# Patient Record
Sex: Male | Born: 1940
Health system: Southern US, Community
[De-identification: ages and names within clinical notes are randomized; demographics above are authoritative.]

## PROBLEM LIST (undated history)

## (undated) DIAGNOSIS — M1990 Unspecified osteoarthritis, unspecified site: Secondary | ICD-10-CM

## (undated) DIAGNOSIS — D649 Anemia, unspecified: Secondary | ICD-10-CM

## (undated) DIAGNOSIS — H9190 Unspecified hearing loss, unspecified ear: Secondary | ICD-10-CM

## (undated) DIAGNOSIS — I219 Acute myocardial infarction, unspecified: Secondary | ICD-10-CM

## (undated) DIAGNOSIS — H05249 Constant exophthalmos, unspecified eye: Secondary | ICD-10-CM

## (undated) DIAGNOSIS — E079 Disorder of thyroid, unspecified: Secondary | ICD-10-CM

## (undated) DIAGNOSIS — H53121 Transient visual loss, right eye: Secondary | ICD-10-CM

## (undated) DIAGNOSIS — E785 Hyperlipidemia, unspecified: Secondary | ICD-10-CM

## (undated) DIAGNOSIS — H903 Sensorineural hearing loss, bilateral: Secondary | ICD-10-CM

## (undated) DIAGNOSIS — D333 Benign neoplasm of cranial nerves: Secondary | ICD-10-CM

## (undated) DIAGNOSIS — I2510 Atherosclerotic heart disease of native coronary artery without angina pectoris: Secondary | ICD-10-CM

## (undated) HISTORY — DX: Transient visual loss, right eye: H53.121

## (undated) HISTORY — DX: Disorder of thyroid, unspecified: E07.9

## (undated) HISTORY — DX: Acute myocardial infarction, unspecified: I21.9

## (undated) HISTORY — PX: WISDOM TOOTH EXTRACTION: SHX21

## (undated) HISTORY — DX: Unspecified osteoarthritis, unspecified site: M19.90

## (undated) HISTORY — DX: Constant exophthalmos, unspecified eye: H05.249

## (undated) HISTORY — DX: Hyperlipidemia, unspecified: E78.5

## (undated) HISTORY — DX: Benign neoplasm of cranial nerves: D33.3

## (undated) HISTORY — DX: Sensorineural hearing loss, bilateral: H90.3

## (undated) HISTORY — DX: Atherosclerotic heart disease of native coronary artery without angina pectoris: I25.10

## (undated) HISTORY — PX: CARDIAC CATHETERIZATION: SHX172

## (undated) HISTORY — PX: TONSILLECTOMY AND ADENOIDECTOMY: SHX28

---

## 2001-01-06 ENCOUNTER — Ambulatory Visit (HOSPITAL_COMMUNITY): Admission: RE | Admit: 2001-01-06 | Discharge: 2001-01-06 | Payer: Self-pay | Admitting: Cardiology

## 2001-01-06 ENCOUNTER — Encounter: Payer: Self-pay | Admitting: Cardiology

## 2010-07-11 ENCOUNTER — Inpatient Hospital Stay (HOSPITAL_COMMUNITY)
Admission: EM | Admit: 2010-07-11 | Discharge: 2010-07-14 | Disposition: A | Discharge status: Home / Self Care | Payer: Self-pay | Source: Home / Self Care | Attending: Cardiology | Admitting: Cardiology

## 2010-07-11 LAB — COMPREHENSIVE METABOLIC PANEL
ALT: 19 U/L (ref 0–53)
AST: 20 U/L (ref 0–37)
Albumin: 3.6 g/dL (ref 3.5–5.2)
Alkaline Phosphatase: 40 U/L (ref 39–117)
BUN: 16 mg/dL (ref 6–23)
CO2: 23 mEq/L (ref 19–32)
Calcium: 8.5 mg/dL (ref 8.4–10.5)
Chloride: 105 mEq/L (ref 96–112)
Creatinine, Ser: 0.88 mg/dL (ref 0.4–1.5)
GFR calc Af Amer: >60 mL/min (ref >60)
GFR calc non Af Amer: >60 mL/min (ref >60)
Glucose, Bld: 109 mg/dL — ABNORMAL HIGH (ref 70–99)
Potassium: 3.9 mEq/L (ref 3.5–5.1)
Sodium: 136 mEq/L (ref 135–145)
Total Bilirubin: 0.3 mg/dL (ref 0.3–1.2)
Total Protein: 6.2 g/dL (ref 6.0–8.3)

## 2010-07-11 LAB — APTT: aPTT: 27 seconds (ref 24–37)

## 2010-07-11 LAB — POCT CARDIAC MARKERS
CKMB, poc: 1.1 ng/mL (ref 1.0–8.0)
Myoglobin, poc: 62.2 ng/mL (ref 12–200)
Troponin i, poc: <0.05 ng/mL (ref 0.00–0.09)

## 2010-07-11 LAB — CBC
HCT: 40.5 % (ref 39.0–52.0)
Hemoglobin: 13.7 g/dL (ref 13.0–17.0)
MCH: 29.5 pg (ref 26.0–34.0)
MCHC: 33.8 g/dL (ref 30.0–36.0)
MCV: 87.1 fL (ref 78.0–100.0)
Platelets: 257 10*3/uL (ref 150–400)
RBC: 4.65 MIL/uL (ref 4.22–5.81)
RDW: 12.9 % (ref 11.5–15.5)
WBC: 7 10*3/uL (ref 4.0–10.5)

## 2010-07-11 LAB — TROPONIN I: Troponin I: <0.01 ng/mL (ref 0.00–0.06)

## 2010-07-11 LAB — DIFFERENTIAL
Basophils Absolute: 0 10*3/uL (ref 0.0–0.1)
Basophils Relative: 0 % (ref 0–1)
Eosinophils Absolute: 0.3 10*3/uL (ref 0.0–0.7)
Eosinophils Relative: 4 % (ref 0–5)
Lymphocytes Relative: 26 % (ref 12–46)
Lymphs Abs: 1.8 10*3/uL (ref 0.7–4.0)
Monocytes Absolute: 0.6 10*3/uL (ref 0.1–1.0)
Monocytes Relative: 9 % (ref 3–12)
Neutro Abs: 4.3 10*3/uL (ref 1.7–7.7)
Neutrophils Relative %: 61 % (ref 43–77)

## 2010-07-11 LAB — PROTIME-INR: INR: 0.9 (ref 0.00–1.49)

## 2010-07-11 LAB — BRAIN NATRIURETIC PEPTIDE: Pro B Natriuretic peptide (BNP): <30 pg/mL (ref 0.0–100.0)

## 2010-07-12 LAB — CBC
MCV: 87.3 fL (ref 78.0–100.0)
Platelets: 229 10*3/uL (ref 150–400)
RBC: 4.32 MIL/uL (ref 4.22–5.81)
WBC: 6.2 10*3/uL (ref 4.0–10.5)

## 2010-07-12 LAB — LIPID PANEL
Cholesterol: 189 mg/dL (ref 0–200)
HDL: 27 mg/dL — ABNORMAL LOW (ref >39)

## 2010-07-12 LAB — CARDIAC PANEL(CRET KIN+CKTOT+MB+TROPI)
CK, MB: 2.4 ng/mL (ref 0.3–4.0)
Relative Index: INVALID (ref 0.0–2.5)
Total CK: 82 U/L (ref 7–232)
Troponin I: <0.01 ng/mL (ref 0.00–0.06)

## 2010-07-12 LAB — TSH: TSH: 2.514 u[IU]/mL (ref 0.350–4.500)

## 2010-07-12 LAB — HEPARIN LEVEL (UNFRACTIONATED): Heparin Unfractionated: 0.41 IU/mL (ref 0.30–0.70)

## 2010-07-12 LAB — HEMOGLOBIN A1C
Hgb A1c MFr Bld: 5.9 % — ABNORMAL HIGH (ref <5.7)
Mean Plasma Glucose: 123 mg/dL — ABNORMAL HIGH (ref <117)

## 2010-07-12 LAB — SURGICAL PCR SCREEN: MRSA, PCR: NEGATIVE

## 2010-07-13 ENCOUNTER — Encounter: Payer: Self-pay | Admitting: Cardiovascular Disease

## 2010-07-13 LAB — BASIC METABOLIC PANEL
CO2: 24 mEq/L (ref 19–32)
Chloride: 106 mEq/L (ref 96–112)
GFR calc Af Amer: >60 mL/min (ref >60)
Sodium: 138 mEq/L (ref 135–145)

## 2010-07-13 LAB — HEPARIN LEVEL (UNFRACTIONATED)
Heparin Unfractionated: 0.44 IU/mL (ref 0.30–0.70)
Heparin Unfractionated: 0.3 IU/mL (ref 0.30–0.70)

## 2010-07-13 LAB — CBC
HCT: 39.9 % (ref 39.0–52.0)
Hemoglobin: 13.5 g/dL (ref 13.0–17.0)
RBC: 4.51 MIL/uL (ref 4.22–5.81)
WBC: 5.9 10*3/uL (ref 4.0–10.5)

## 2010-07-14 LAB — BASIC METABOLIC PANEL
BUN: 21 mg/dL (ref 6–23)
CO2: 25 mEq/L (ref 19–32)
Chloride: 104 mEq/L (ref 96–112)
Creatinine, Ser: 1.05 mg/dL (ref 0.4–1.5)
GFR calc Af Amer: >60 mL/min (ref >60)
Potassium: 4.3 mEq/L (ref 3.5–5.1)

## 2010-07-14 LAB — CBC
Hemoglobin: 12.7 g/dL — ABNORMAL LOW (ref 13.0–17.0)
MCH: 28.7 pg (ref 26.0–34.0)
MCV: 89.4 fL (ref 78.0–100.0)
Platelets: 236 10*3/uL (ref 150–400)
RBC: 4.43 MIL/uL (ref 4.22–5.81)
WBC: 9 10*3/uL (ref 4.0–10.5)

## 2010-07-14 LAB — POCT ACTIVATED CLOTTING TIME: Activated Clotting Time: 499 seconds

## 2010-07-14 NOTE — Procedures (Signed)
NAME:  Jeffery Turner, Jeffery Turner                  ACCOUNT NO.:  [id number]  MEDICAL RECORD NO.:  [id number]          PATIENT TYPE:  INP  LOCATION:  2807                         FACILITY:  [facility]  PHYSICIAN:  Veverly Fells. Excell Seltzer, MD  DATE OF BIRTH:  1940-09-26  DATE OF PROCEDURE:  07/13/2010 DATE OF DISCHARGE:                           CARDIAC CATHETERIZATION   PROCEDURES: 1. PTCA and stenting of the right coronary artery. 2. PTCA and stenting of the mid-LAD.  PROCEDURAL INDICATIONS:  Jeffery Turner is a 70 year old gentleman with known CAD.  He has undergone previous stenting of the left circumflex.  He presented with unstable angina and underwent diagnostic catheterization by Dr. Eden Emms this morning.  This demonstrated severe two-vessel coronary disease involving the right coronary artery and LAD.  We elected to proceed with PCI.  PROCEDURE IN DETAIL:  There was an indwelling 5-French sheath.  The sheath was upsized to a 6-French.  Attention was first turned to the right coronary artery which appeared to be the patient's culprit lesion. There was a critical, long-segment 95% mid stenosis.  The patient has a previous intolerance to PLAVIX and TICLID.  He was thus treated with Brilinta.  He was loaded with 180 mg on the table.  Angiomax was used for anticoagulation.  A Cougar guidewire was advanced into the distal right coronary artery once a therapeutic ACT was achieved.  The lesion was predilated with a 2.5 x 20 mm track balloon which was taken to 10 atmospheres on a single inflation.  The vessel was then stented with a 3.5 x 28-mm Promus drug-eluting stent which was carefully positioned and deployed at 12 atmospheres.  The stent appeared well expanded.  The stent was then postdilated with a 3.75 x 20-mm [place] track balloon which was dilated twice, up to 16 atmospheres, so that the entire stented segment was covered.  There was an excellent angiographic result with 0% residual stenosis.  There was  diffuse proximal stenosis, but it appeared nonobstructive and that area will be treated medically.  Attention was then turned to the LAD.  There was a focal 80% stenosis involving the origin of the first diagonal branch.  The LAD was predilated with a 2.5 x 12 mm track balloon, which was taken to 10 atmospheres and the balloon appeared well expanded.  It was then stented with a 2.75 x 16 mm Promus drug-eluting stent.  The stent was deployed at 12 atmospheres and appeared well expanded.  The stented segment was then postdilated with a 3.0 x 15-mm [place] track balloon, which was taken to 14 atmospheres on a single inflation.  There was an excellent angiographic result with 0% residual stenosis.  The patient tolerated the procedure well.  There was some segmental disease near the apical region of the LAD, and I thought this would be best left for medical therapy based on the distal location of the disease.  The guidewire and guide catheter were removed.  A TR band was used for radial hemostasis.  The patient will be transferred to the recovery area. FINAL ASSESSMENT:  Successful two-vessel PCI using a drug-eluting stent in the mid  right coronary artery and a drug-eluting stent in the mid LAD.  RECOMMENDATIONS:  The patient should continue on aspirin and Brilinta for 12 months minimum.  The Brilinta dose will be 90 mg twice daily.     Veverly Fells. Excell Seltzer, MD     MDC/MEDQ  D:  07/13/2010  T:  07/14/2010  Job:  [id number]  cc:   Rollene Rotunda, MD, [facility]  Electronically Signed by Tonny Bollman MD on 07/14/2010 05:02:30 AM

## 2010-07-15 NOTE — Discharge Summary (Addendum)
NAME:  Jeffery Turner, Jeffery Turner                  ACCOUNT NO.:  [id number]  MEDICAL RECORD NO.:  [id number]          PATIENT TYPE:  INP  LOCATION:  6531                         FACILITY:  [facility]  PHYSICIAN:  Veverly Fells. Excell Seltzer, MD  DATE OF BIRTH:  12/05/1940  DATE OF ADMISSION:  07/11/2010 DATE OF DISCHARGE:  07/14/2010                              DISCHARGE SUMMARY   DISCHARGE DIAGNOSES: 1. Unstable angina with severe two-vessel coronary artery disease     within the right coronary artery and mid left anterior descending,     status post successful drug-eluting stent placement to both the mid     right coronary artery and mid left anterior descending.     a.     The patient has history of easy bleeding and therefore will      be placed on Brilinta and 81 mg of aspirin daily.     b.     History of myocardial infarction in 1997 with stent to the      obtuse marginal-3.     c.     Ejection fraction of 60% by cath, July 13, 2010. 2. Hyperlipidemia.  Total cholesterol 189, triglycerides 244, HDL 27,     and LDL 113 with history of statin intolerance.  Low dose Crestor     at 5 mg daily initiated this admission, will need LFTs drawn in     approximately 4 weeks. 3. Impaired glucose tolerance with hemoglobin A1c of 5.9, instructed     to follow up with primary care doctor for surveillance for diabetes     risk. 4. Remote history of tobacco abuse. 5. Osteoarthritis.  HOSPITAL COURSE:  Mr. Jeffery Turner is a 70 year old gentleman with a prior history of coronary artery disease with an MI in 1997 with stent placement to OM-3 at that time.  He also has history of hyperlipidemia and presented to [facility] with complaints of exertional chest pain as well as shortness of breath and nausea.  His symptoms progressed and at first when we watched him on treadmill with exercise over the last week prior to admission, he got symptoms with walking less than 100 feet on flat ground.  On admission to the  hospital, cardiac enzymes were cycled which were negative x4.  The patient's symptoms were concerning for unstable angina and therefore, he was placed on IV heparin, aspirin, and beta-blockade.  Lipid panel was checked which showed the above results and Crestor 5 mg at a low dose was initiated given his history of LFT elevation with statin.  LFTs were normal on July 11, 2010. With careful attention that these should be checked as an outpatient as well in several weeks.  The patient underwent cardiac catheterization on July 13, 2010, after being stable over the weekend.  This showed two- vessel disease with patent stents in the circumflex.  He had flow- limiting stenosis in both the LAD and RCA.  This was subsequently stented by Dr. Excell Seltzer with Promus drug-eluting stents placed both in mid RCA and mid LAD.  EF was normal during those cath.  The patient did well post  procedurally.  Labs remained stable.  He did have some nausea this morning which was initially attributed to Brilinta and later on, it was discovered that it was possibly related to his pain medicine NSAIDs. The patient was seen and examined by Dr. Excell Seltzer and will be discharged in stable condition to home per his recommendation.  DISCHARGE LABORATORY DATA:  WBC 5.9, hemoglobin 13.5, hematocrit 39.9, and platelet count 244.  Sodium 138, potassium 4.6, chloride 106, CO2 of 24, glucose 113, BUN 14, and creatinine 0.89.  LFTs were normal on July 11, 2010.  A1c is 5.9.  Cardiac enzymes negative x3. Cholesterol panel as above.  STUDIES: 1. Chest x-ray, July 11, 2010, showed normal heart size and clear     lungs. 2. Cardiac catheterization, July 13, 2010, please see full report     for details.  DISCHARGE MEDICATIONS: 1. Carvedilol 3.125 mg b.i.d. 2. Nitroglycerin sublingual 0.4 mg every 5 minutes as needed up to 3     doses. 3. Crestor 5 mg at bedtime. 4. Ticagrelor 90 mg b.i.d. 5. Aspirin 81 mg daily. 6.  Echinacea 3 capsules t.i.d. p.r.n. 7. Glucosamine 1-2 tablets daily. 8. Multivitamin 1 tablet daily. 9. Vitamin C 3 tablets t.i.d. p.r.n.  DISPOSITION:  [name] will be discharged in stable condition to home. He is not to lift anything or participate in sexual activity for 1 week or drive for 2 days.  He is to follow a low-sodium, heart-healthy diet and if he notices any pain, swelling, bleeding, or pus at his cath site, he is to call or return.  He will follow up with Tereso Newcomer, PA-C, for his initial post hospital visit on July 27, 2010, at 10 a.m.  He is also instructed to follow up with his primary care doctor for surveillance of his blood sugars as it was mildly elevated with a hemoglobin A1c of 5.9 putting him at risk for diabetes.  He should have LFTs drawn in approximately 4-6 weeks to monitor stability of his LFTs given history of statin intolerance.  The patient was given a Brilinta 30-day assistance card.  DURATION OF DISCHARGE ENCOUNTER:  Greater than 30 minutes including physician and PA time.     Ronie Spies, P.A.C.   ______________________________ Veverly Fells. Excell Seltzer, MD    DD/MEDQ  D:  07/14/2010  T:  07/15/2010  Job:  [id number]  cc:   [facility]  Electronically Signed by Ronie Spies  on 07/15/2010 09:31:48 PM Electronically Signed by Tonny Bollman MD on 07/17/2010 02:03:56 PM

## 2010-07-16 NOTE — Procedures (Signed)
  NAME:  Jeffery Turner, Jeffery Turner                  ACCOUNT NO.:  [id number]  MEDICAL RECORD NO.:  [id number]           PATIENT TYPE:  LOCATION:                                 FACILITY:  PHYSICIAN:  Sargent Mankey C. Eden Emms, MD, FACCDATE OF BIRTH:  30-Jan-1941  DATE OF PROCEDURE: DATE OF DISCHARGE:                           CARDIAC CATHETERIZATION   A 70 year old patient with history of stent to the circumflex coronary artery, admitted with unstable angina.  Cine catheterization was done with 5-French catheter from the right radial artery.  Standard JL-3.5 and JR-4 catheters were used to engage the coronaries.  The patient received 6000 units of heparin, 3 mg of verapamil.  He was sedated with 3 mg of Versed and 25 mcg of fentanyl.  Left main coronary was normal.  Left anterior descending artery had a 70% lesion in the midvessel, distal vessel had 60% tubular disease.  First and second diagonal branch were normal.  Circumflex coronary artery was nondominant.  The stent in the large mid part of the vessel was widely patent.  First obtuse marginal branch had 30% to 40% multiple lesions.  Right coronary artery was dominant.  There was somewhat aneurysmal 30% disease proximally.  The distal vessel had a 90% stenosis.  PDA had 40% tubular disease.  RAO ventriculography:  RAO ventriculography was normal.  EF was 60%. There was no regional wall motion abnormalities.  The aortic pressure was 112/62, LV pressure was 110/4.  IMPRESSION:  The patient has severe two-vessel disease with a patent stent in his circumflex.  Films were reviewed with Dr. Excell Seltzer.  He will proceed with angioplasty and stenting of both the right coronary artery and left anterior descending.  He has had previous issues with bleeding and bruising on Plavix and Ticlid.  -__________ will be used as an antiplatelet agent due to shorter half-life  The patient tolerated his diagnostic catheterization well.     Noralyn Pick. Eden Emms, MD,  [facility]     PCN/MEDQ  D:  07/13/2010  T:  07/14/2010  Job:  [id number]  Electronically Signed by Charlton Haws MD [facility] on 07/16/2010 10:38:31 PM

## 2010-07-21 ENCOUNTER — Encounter: Payer: Self-pay | Admitting: Physician Assistant

## 2010-07-21 ENCOUNTER — Ambulatory Visit (INDEPENDENT_AMBULATORY_CARE_PROVIDER_SITE_OTHER): Payer: Medicare Other | Admitting: Physician Assistant

## 2010-07-21 ENCOUNTER — Ambulatory Visit: Payer: Self-pay | Admitting: Physician Assistant

## 2010-07-21 DIAGNOSIS — I2510 Atherosclerotic heart disease of native coronary artery without angina pectoris: Secondary | ICD-10-CM

## 2010-07-21 DIAGNOSIS — I25118 Atherosclerotic heart disease of native coronary artery with other forms of angina pectoris: Secondary | ICD-10-CM | POA: Insufficient documentation

## 2010-07-21 DIAGNOSIS — Z87891 Personal history of nicotine dependence: Secondary | ICD-10-CM

## 2010-07-21 DIAGNOSIS — R040 Epistaxis: Secondary | ICD-10-CM

## 2010-07-21 DIAGNOSIS — M1990 Unspecified osteoarthritis, unspecified site: Secondary | ICD-10-CM | POA: Insufficient documentation

## 2010-07-21 DIAGNOSIS — E782 Mixed hyperlipidemia: Secondary | ICD-10-CM

## 2010-07-21 DIAGNOSIS — E785 Hyperlipidemia, unspecified: Secondary | ICD-10-CM | POA: Insufficient documentation

## 2010-07-21 DIAGNOSIS — R0602 Shortness of breath: Secondary | ICD-10-CM

## 2010-07-21 DIAGNOSIS — I200 Unstable angina: Secondary | ICD-10-CM

## 2010-07-21 DIAGNOSIS — I252 Old myocardial infarction: Secondary | ICD-10-CM

## 2010-07-21 HISTORY — DX: Old myocardial infarction: I25.2

## 2010-07-21 HISTORY — DX: Shortness of breath: R06.02

## 2010-07-21 HISTORY — DX: Unspecified osteoarthritis, unspecified site: M19.90

## 2010-07-21 HISTORY — DX: Epistaxis: R04.0

## 2010-07-21 HISTORY — DX: Personal history of nicotine dependence: Z87.891

## 2010-07-21 HISTORY — DX: Mixed hyperlipidemia: E78.2

## 2010-07-21 HISTORY — DX: Atherosclerotic heart disease of native coronary artery with other forms of angina pectoris: I25.118

## 2010-07-21 HISTORY — DX: Unstable angina: I20.0

## 2010-07-21 NOTE — H&P (Signed)
NAME:  Jeffery Turner, Jeffery Turner NO.:  [id number]  MEDICAL RECORD NO.:  [id number]          PATIENT TYPE:  INP  LOCATION:  2012                         FACILITY:  [facility]  PHYSICIAN:  Cassell Clement, M.D. DATE OF BIRTH:  23-Oct-1940  DATE OF ADMISSION:  07/11/2010 DATE OF DISCHARGE:                             HISTORY & PHYSICAL   PRIMARY CARE PHYSICIAN:  At [facility].  PRIMARY CARDIOLOGIST:  Gene Serpe, PA-C, original cardiologist unclear.  CHIEF COMPLAINT:  Chest pain.  HISTORY OF PRESENT ILLNESS:  Jeffery Turner is a 70 year old male with a history of coronary artery disease.  He had onset of episodic substernal chest pain that reached to 3/10.  It was associated with exertion.  He had multiple episodes that were relieved by rest in about 15 minutes. It radiated up into his jaw and into his teeth.  He had slight nausea and minimal shortness of breath.  He noticed a slight dyspnea on exertion.  His symptoms progressed and at first were only when walking on the treadmill during exercise.  Over the last week, they have progressed and now he gets symptoms walking less than 100 feet on flat ground.  The progression in his symptoms made him decide to come to the hospital.  He has not had any resting chest pain and is currently pain free.  PAST MEDICAL HISTORY: 1. History of MI in 1997 with a stent to the OM-3. 2. Status post cardiac catheterization last in 2002 showing an EF of     57%, LAD 30 and 40%, circumflex 40%, 30% in-stent restenosis in the     circumflex, RCA 30%. 3. Stress Cardiolite in August 2002 showing an inferior scar, but no     ischemia, EF 55%. 4. Hyperlipidemia. 5. Remote history of tobacco use. 6. Family history of coronary artery disease. 7. Osteoarthritis.  SURGICAL HISTORY:  He is status post cardiac catheterizations as well as throat surgery.  ALLERGIES:  He is allergic or intolerant to CODEINE and TICLID and had side effects  with PLAVIX.  CURRENT MEDICATIONS: 1. Glucosamine 2 tablets daily. 2. Multivitamin daily. 3. Echinacea 3 capsules 3 times a day p.r.n. 4. Vitamin C OTC 3 tablets 3 times a day p.r.n. 5. Aspirin 325 mg 1 tablet daily. 6. Serrapeptase daily.  SOCIAL HISTORY:  He lives in [place] with family.  He is a retired Museum/gallery exhibitions officer.  He quit tobacco in 1997 with approximately 30-pack- year history and denies alcohol or drug abuse.  FAMILY HISTORY:  His mother died at age 70 and his father died at 74, neither one with coronary artery disease.  His father died from a reaction to sulfa.  He has a brother who had coronary artery disease starting in his 39s.  REVIEW OF SYSTEMS:  He has chronic arthralgias.  He has not had fevers, chills, or cough or cold symptoms.  Full 14-point review of systems is essentially negative except as stated in the HPI.  PHYSICAL EXAMINATION:  VITAL SIGNS:  He is afebrile.  Blood pressure 131/77, heart rate 75, respiratory rate 15, and O2 saturation 98% on  2 liters. GENERAL:  He is a well-developed, elderly white male in no acute distress. HEENT:  Normal. NECK:  There is no lymphadenopathy, thyromegaly, bruit, or JVD noted. CARDIOVASCULAR:  His heart is regular in rate and rhythm with an S1 and S2 and no significant murmur, rub, or gallop is noted.  Distal pulses are intact in all 4 extremities. LUNGS:  Clear to auscultation bilaterally. SKIN:  No rashes or lesions are noted. ABDOMEN:  Soft and nontender with active bowel sounds. EXTREMITIES:  There is no cyanosis, clubbing, or edema noted. MUSCULOSKELETAL:  There is no joint deformity or effusions and no spine or CVA tenderness. NEURO:  He is alert and oriented with cranial nerves II-XII grossly intact.  Chest x-ray:  No acute disease.  EKG is sinus rhythm, rate 77, with nonspecific interventricular conduction delay and QRS duration of 116 milliseconds.  No old tracing is available for comparison and  there is no ST elevation noted.  LABORATORY VALUES:  Hemoglobin 13.7, hematocrit 40.5, WBC 7.0, and platelets 257.  Sodium 136, potassium 3.9, chloride 105, CO2 of 23, BUN 16, creatinine 0.88, and glucose 109.  CK-MB 97/2.9 with a troponin of 0.01 and point-of-care markers negative x1, BNP less than 30.  IMPRESSION:  Jeffery Turner was seen today by Dr. Patty Sermons, the patient evaluated and the data reviewed.  Jeffery Turner is a 70 year old male who presents with a history of typical exertional angina with radiation to his jaw, relieved by rest.  He has had no rest angina.  His exam is stable and his EKG shows no acute changes.  IMPRESSION: 1. Acute coronary syndrome with recent onset, progressive exertional     angina. 2. Status post remote total occlusion of circumflex, treated with     successful primary stenting by Dr. Riley Kill on March 02, 1996.     Last cath was in 2002 showing no critical disease. 3. Intolerant to STATINS, with elevated liver function tests.  PLAN: 1. Admit to telemetry. 2. IV heparin, aspirin, and low-dose beta-blocker. 3. Cardiac catheterization on Monday or sooner on a p.r.n. basis.     Jeffery Demark, PA-C   ______________________________ Cassell Clement, M.D.    RB/MEDQ  D:  07/11/2010  T:  07/12/2010  Job:  [id number]  Electronically Signed by Jeffery Demark PA-C on 07/20/2010 12:52:09 PM Electronically Signed by Cassell Clement M.D. on 07/21/2010 09:02:58 AM

## 2010-07-27 ENCOUNTER — Ambulatory Visit: Payer: Self-pay | Admitting: Physician Assistant

## 2010-07-30 NOTE — Cardiovascular Report (Signed)
Summary: [facility] Cardiac Cath  [facility] Cardiac Cath   Imported By: Earl Many 07/22/2010 16:23:30  _____________________________________________________________________  External Attachment:    Type:   Image     Comment:   External Document

## 2010-07-30 NOTE — Assessment & Plan Note (Signed)
Summary: eph.per dana pa.gd--please schedule follow-up liver panel per...   Visit Type:  Follow-up  CC:  sob and slight dizziness.  History of Present Illness: Primary Cardiologist:  Dr. Tonny Bollman  Nihar Klus is a 70 yo male with a history of myocardial infarction in 1997 treated with stenting to the circumflex.  He presented to [facility] on 1/28 with symptoms of unstable angina.  He ruled out for myocardial infarction.  Cardiac catheterization demonstrated significant disease in the LAD and RCA and he underwent drug-eluting stent placement to the mid RCA and mid LAD.  His ejection fraction was 60%.  He has a history of elevated LFTs with statins.  It was felt that he should try low-dose Crestor and have close followup of his LFTs.  He was placed on Brinlinta due to concerns over bleeding.  This was chosen due to its short half-life.  He returns for followup today.  He stopped taking his Brilinta about 2 days after getting home.  He complains of epistaxis.  He notes that he would have significant amounts of blood running down his throat especially at night.  This completely stopped when he stopped taking the Brilinta.  He denies chest pain.  He denies syncope.  He denies orthopnea or PND.  He continues to have significant dyspnea with exertion.  This symptom predated his unstable angina.  It is unchanged.  Current Medications (verified): 1)  Carvedilol 3.125 Mg Tabs (Carvedilol) .... Take One Two Times A Day 2)  Crestor 5 Mg Tabs (Rosuvastatin Calcium) .... Take One Daily 3)  Aspirin 81 Mg Tbec (Aspirin) .... Take One Daily 4)  Nitrostat 0.4 Mg Subl (Nitroglycerin) .... Take As Needed 5)  Multivitamins  Caps (Multiple Vitamin) .... Take One Daily 6)  Glucosamine 500 Mg Caps (Glucosamine Sulfate) .... Take One To Two Daily  Allergies (verified): 1)  ! Codeine  Past History:  Past Medical History: CAD   a. s/p stent to CFX 1997 2/2 MI   b. s/p DES to [facility] and DES mLAD 07/14/10 2/2 [place]   c.  cath 07/14/10: dLAD 60%; CFX stent ok; OM1 30-40%; pRCA 30%; PDA 40%; EF 60% OSTEOARTHRITIS (ICD-715.90) HYPERLIPIDEMIA (ICD-272.4) Glucose Intolerance     Social History: Reviewed history from 07/20/2010 and no changes required. He lives in [place] with family.  He is a retired   Museum/gallery exhibitions officer.  He quit tobacco in 1997 with approximately 30-pack-   year history and denies alcohol or drug abuse.      Review of Systems       As per  the HPI.  All other systems reviewed and negative.   Vital Signs:  Patient profile:   70 year old male Height:      68 inches Weight:      206 pounds BMI:     31.44 Pulse rate:   80 / minute BP sitting:   108 / 68  (right arm)  Vitals Entered By: Jacquelin Hawking, CMA (July 21, 2010 1:28 PM)  Physical Exam  General:  Well nourished, well developed, in no acute distress HEENT: normal Neck: no JVD Cardiac:  normal S1, S2; RRR; no murmur Lungs:  clear to auscultation bilaterally, no wheezing, rhonchi or rales Abd: soft, nontender, no hepatomegaly Ext: no edema; right radial site without hematoma or bruit Skin: warm and dry Neuro:  CNs 2-12 intact, no focal abnormalities noted    EKG  Procedure date:  07/21/2010  Findings:      normal sinus  rhythm Heart rate 83 Normal axis Interventricular conduction delay Nonspecific ST-T wave changes No significant change since prior tracing.  Impression & Recommendations:  Problem # 1:  CAD (ICD-414.00) He is stable since he underwent stenting of his RCA and LAD.  He continues on aspirin.  Unfortunately, he stopped taking his Brilinta.  I explained to him today the importance of taking this medication along with aspirin.  He understands this.  I have placed him on Effient 10 mg a day.  I have given him samples.  If he has recurrent epistaxis, he should call us.  At that time, I think we should consider sending him to ENT.  He knows that he should not stop his dual antiplatelet therapy.  Follow  up with Dr. Excell Seltzer in 2 weeks.  Orders: EKG w/ Interpretation ([id number])  Problem # 2:  HYPERLIPIDEMIA (ICD-272.4) He has a history of elevated LFTs.  He will have followup LFTs when he returns in 2 weeks to see Dr. Excell Seltzer.  We will also arrange followup lipids and LFTs in about 8 weeks. His updated medication list for this problem includes:    Crestor 5 Mg Tabs (Rosuvastatin calcium) .Marland Kitchen... Take one daily  Problem # 3:  SHORTNESS OF BREATH (ICD-786.05) I suspect he probably has COPD.  I have asked him to follow up with his primary care provider for further management.  Problem # 4:  EPISTAXIS (ICD-784.7) As noted, I would suggest that we send him to ENT should he have recurrent epistaxis on Effient and aspirin.  Patient Instructions: 1)  Your physician recommends that you schedule a follow-up appointment in: 08/13/10 @ 9am to see Dr. Excell Seltzer. 2)  Your physician recommends that you return for lab work in: 08/13/10 before your appt with Dr. Excell Seltzer for  LFT for hyperlipidemia.Marland KitchenMarland KitchenMarland KitchenYou are also to come back on 10/05/10 to have Liver and lipid function test repeated.  3)  Your physician has recommended you make the following change in your medication: Start Effient 10 mg 1 tablet every day.  A new prescription has been given to you today. CALL us IF YOU HAVE ANY NOSE BLEEDS ( DO NOT STOP MEDICATION WITH OUT TALKING TO CARDIOLOGIST FIRST, THIS IS VERY IMPROTANT). Prescriptions: EFFIENT 10 MG TABS (PRASUGREL HCL) Take 1 tablet by mouth once a day  #30 x 11   Entered and Authorized by:   Tereso Newcomer PA-C   Signed by:   Danielle Rankin, CMA on 07/21/2010   Method used:   Print then Give to Patient   RxID:   [id number]

## 2010-08-12 ENCOUNTER — Other Ambulatory Visit: Payer: Self-pay | Admitting: Cardiovascular Disease

## 2010-08-12 ENCOUNTER — Encounter: Payer: Self-pay | Admitting: Cardiovascular Disease

## 2010-08-12 ENCOUNTER — Ambulatory Visit (INDEPENDENT_AMBULATORY_CARE_PROVIDER_SITE_OTHER): Payer: Medicare Other | Admitting: Cardiovascular Disease

## 2010-08-12 ENCOUNTER — Other Ambulatory Visit (INDEPENDENT_AMBULATORY_CARE_PROVIDER_SITE_OTHER): Payer: Medicare Other

## 2010-08-12 DIAGNOSIS — E785 Hyperlipidemia, unspecified: Secondary | ICD-10-CM

## 2010-08-12 DIAGNOSIS — I2510 Atherosclerotic heart disease of native coronary artery without angina pectoris: Secondary | ICD-10-CM

## 2010-08-12 DIAGNOSIS — R040 Epistaxis: Secondary | ICD-10-CM

## 2010-08-12 LAB — CBC WITH DIFFERENTIAL/PLATELET
Eosinophils Relative: 3.9 % (ref 0.0–5.0)
HCT: 40 % (ref 39.0–52.0)
Hemoglobin: 13.9 g/dL (ref 13.0–17.0)
Lymphs Abs: 1.7 10*3/uL (ref 0.7–4.0)
MCV: 91.9 fl (ref 78.0–100.0)
Monocytes Absolute: 0.7 10*3/uL (ref 0.1–1.0)
Monocytes Relative: 8.7 % (ref 3.0–12.0)
Neutro Abs: 5.6 10*3/uL (ref 1.4–7.7)
Platelets: 262 10*3/uL (ref 150.0–400.0)
WBC: 8.4 10*3/uL (ref 4.5–10.5)

## 2010-08-12 LAB — HEPATIC FUNCTION PANEL
ALT: 18 U/L (ref 0–53)
AST: 17 U/L (ref 0–37)
Albumin: 4.2 g/dL (ref 3.5–5.2)
Total Protein: 6.9 g/dL (ref 6.0–8.3)

## 2010-08-13 ENCOUNTER — Other Ambulatory Visit: Payer: Medicare Other

## 2010-08-13 ENCOUNTER — Ambulatory Visit: Payer: Medicare Other | Admitting: Cardiovascular Disease

## 2010-08-14 ENCOUNTER — Telehealth: Payer: Self-pay | Admitting: Cardiovascular Disease

## 2010-08-17 ENCOUNTER — Telehealth (INDEPENDENT_AMBULATORY_CARE_PROVIDER_SITE_OTHER): Payer: Self-pay | Admitting: *Deleted

## 2010-08-20 NOTE — Progress Notes (Signed)
Summary: pt need referral to ent  Phone Note Call from Patient   Caller: Patient [phone] ok to leave message Reason for Call: Talk to Nurse Summary of Call: dr Vallerie Hentz suggested pt see ent pt called and they need a refferal call or fax from [place], [facility] in [place] [phone] fax [phone] pt prefers am any day Initial call taken by: Glynda Jaeger,  August 14, 2010 8:55 AM  Follow-up for Phone Call        [facility] currently closed for lunch. Julieta Gutting, RN, BSN  August 14, 2010 12:03 PM  I spoke with Noreene Larsson at [facility] and they just started a new computer system.  They would like the pt's demographics, insurance, OV note and reason for referral faxed to them. They cannot look at the pt's paperwork until next week.  They will make our office aware of pt's appt next week. I made the pt's wife aware and she would like the pt referred to another ENT in [place] because of the delay in [place].  Julieta Gutting, RN, BSN  August 14, 2010 2:01 PM  Additional Follow-up for Phone Call Additional follow up Details #1::        I scheduled the pt to see Dr Narda Bonds on Monday at 2:15.  ( fax [phone])  Pt's wife aware and said she would probably call the office to reschedule appt. Julieta Gutting, RN, BSN  August 14, 2010 2:14 PM

## 2010-08-20 NOTE — Assessment & Plan Note (Signed)
Summary: 3 wk f/u    Visit Type:  Follow-up Primary Provider:  Dr Ardyth Gal - [facility]  CC:  No complaints.  History of Present Illness: Jeffery Turner is a 70 yo male with a history of myocardial infarction in 1997 treated with stenting to the circumflex.  He presented to [facility] on 1/28 with symptoms of unstable angina.  He ruled out for myocardial infarction.  Cardiac catheterization demonstrated significant disease in the LAD and RCA and he underwent drug-eluting stent placement to the mid RCA and mid LAD.  His ejection fraction was 60%.    His main problem since PCI is epistaxis. He is having significant posterior bleeding and subsequent hemoptysis. He has a history of this in the past but it has been much worse since he has started dual antiplatelet Rx. He was initially discharged from the hospital on Brilinta and then switched to Effient.   He denies chest pain, dyspnea, or edema. No other complaints.  Current Medications (verified): 1)  Carvedilol 3.125 Mg Tabs (Carvedilol) .... Take One Two Times A Day 2)  Crestor 5 Mg Tabs (Rosuvastatin Calcium) .... Take One Daily 3)  Aspirin 81 Mg Tbec (Aspirin) .... Take One Daily 4)  Nitrostat 0.4 Mg Subl (Nitroglycerin) .... Take As Needed 5)  Multivitamins  Caps (Multiple Vitamin) .... Take One Daily 6)  Glucosamine 500 Mg Caps (Glucosamine Sulfate) .... Take One To Two Daily 7)  Effient 10 Mg Tabs (Prasugrel Hcl) .... Take 1 Tablet By Mouth Once A Day  Allergies: 1)  ! Codeine  Past History:  Past medical history reviewed for relevance to current acute and chronic problems.  Past Medical History: Reviewed history from 07/21/2010 and no changes required. CAD   a. s/p stent to CFX 1997 2/2 MI   b. s/p DES to [facility] and DES mLAD 07/14/10 2/2 [place]   c. cath 07/14/10: dLAD 60%; CFX stent ok; OM1 30-40%; pRCA 30%; PDA 40%; EF 60% OSTEOARTHRITIS (ICD-715.90) HYPERLIPIDEMIA (ICD-272.4) Glucose Intolerance     Review of  Systems       Negative except as per HPI   Vital Signs:  Patient profile:   70 year old male Height:      68 inches Weight:      200.75 pounds BMI:     30.63 Pulse rate:   80 / minute Pulse rhythm:   regular Resp:     18 per minute BP sitting:   98 / 62  (left arm) Cuff size:   large  Vitals Entered By: Vikki Ports (August 12, 2010 10:27 AM)  Physical Exam  General:  Pt is alert and oriented, in no acute distress, voice hoarseness noted. HEENT: normal Neck: normal carotid upstrokes without bruits, JVP normal Lungs: CTA CV: RRR without murmur or gallop Abd: soft, NT, positive BS, no bruit, no organomegaly Ext: no clubbing, cyanosis, or edema. peripheral pulses 2+ and equal Skin: warm and dry without rash    EKG  Procedure date:  07/21/2010  Findings:      NSR with nonspecific ST-T abnormality, HR 83 bpm.  Impression & Recommendations:  Problem # 1:  CAD (ICD-414.00) Pt stable without angina, but I'm concerned about the degreee of epistaxis he is experiencing. Will check a Hgb and Hct to make sure that he is not anemic. I think we'll be better off with a less potent antiplatelet agent and I've advised him to start plavix 75 mg daily. He will discontinue effient. We discussed his need  for dual antiplatelet Rx following PCI with drug-eluting stents. The guideline recommendation is that he takes both ASA and plavix for 12 months, but we may have to discontinue plavix sooner if his epistaxis cannot be controlled. It would be ideal to get him out to at least 6 months if possible. I'd like to see him back in 2 months for followup to see how he's doing. Will refer him for an ENT evaluation as well.  His updated medication list for this problem includes:    Carvedilol 3.125 Mg Tabs (Carvedilol) .Marland Kitchen... Take one two times a day    Aspirin 81 Mg Tbec (Aspirin) .Marland Kitchen... Take one daily    Nitrostat 0.4 Mg Subl (Nitroglycerin) .Marland Kitchen... Take as needed    Plavix 75 Mg Tabs (Clopidogrel  bisulfate) .Marland Kitchen... Take one tablet by mouth daily  Orders: TLB-CBC Platelet - w/Differential (85025-CBCD) TLB-Hepatic/Liver Function Pnl (80076-HEPATIC)  Problem # 2:  HYPERLIPIDEMIA (ICD-272.4) Goal LDL 70 mg/dL. Will need to followup lipids at the time of his return visit.  His updated medication list for this problem includes:    Crestor 5 Mg Tabs (Rosuvastatin calcium) .Marland Kitchen... Take one daily  Orders: TLB-CBC Platelet - w/Differential (85025-CBCD) TLB-Hepatic/Liver Function Pnl (80076-HEPATIC)  Patient Instructions: 1)  Your physician recommends that you schedule a follow-up appointment in: 2 MONTHS 2)  Your physician recommends that you have lab work today: LIVER, CBC 3)  Your physician has recommended you make the following change in your medication: STOP Effient, START Plavix 75mg  take one by mouth daily 4)  You have been referred to [facility] and Throat for nosebleeds ([phone])--the pt said he would call and make this appointment Prescriptions: PLAVIX 75 MG TABS (CLOPIDOGREL BISULFATE) Take one tablet by mouth daily  #30 x 6   Entered by:   Julieta Gutting, RN, BSN   Authorized by:   Norva Karvonen, MD   Signed by:   Julieta Gutting, RN, BSN on 08/12/2010   Method used:   Electronically to        [facility] [address] [address] (retail)       [address]       [place], [place]  [address]       Ph: [phone] or [phone]       Fax: [phone]   RxID:   [id number]

## 2010-08-25 NOTE — Progress Notes (Signed)
Summary: Records Request  Faxed Labs to [facility] at [facility] ([phone]).  Debby Freiberg  August 17, 2010 4:36 PM

## 2010-08-28 ENCOUNTER — Ambulatory Visit (HOSPITAL_BASED_OUTPATIENT_CLINIC_OR_DEPARTMENT_OTHER)
Admission: RE | Admit: 2010-08-28 | Discharge: 2010-08-28 | Disposition: A | Discharge status: Home / Self Care | Payer: Medicare Other | Source: Ambulatory Visit | Attending: Otolaryngology | Admitting: Otolaryngology

## 2010-08-28 ENCOUNTER — Other Ambulatory Visit: Payer: Self-pay | Admitting: Otolaryngology

## 2010-08-28 DIAGNOSIS — J381 Polyp of vocal cord and larynx: Secondary | ICD-10-CM | POA: Insufficient documentation

## 2010-08-28 DIAGNOSIS — Z01812 Encounter for preprocedural laboratory examination: Secondary | ICD-10-CM | POA: Insufficient documentation

## 2010-08-28 DIAGNOSIS — Z9861 Coronary angioplasty status: Secondary | ICD-10-CM | POA: Insufficient documentation

## 2010-08-28 DIAGNOSIS — R042 Hemoptysis: Secondary | ICD-10-CM | POA: Insufficient documentation

## 2010-08-28 DIAGNOSIS — R490 Dysphonia: Secondary | ICD-10-CM | POA: Insufficient documentation

## 2010-08-28 DIAGNOSIS — I2510 Atherosclerotic heart disease of native coronary artery without angina pectoris: Secondary | ICD-10-CM | POA: Insufficient documentation

## 2010-08-28 LAB — POCT I-STAT, CHEM 8
Creatinine, Ser: 1.1 mg/dL (ref 0.4–1.5)
HCT: 41 % (ref 39.0–52.0)
Hemoglobin: 13.9 g/dL (ref 13.0–17.0)
Potassium: 4.3 mEq/L (ref 3.5–5.1)
Sodium: 138 mEq/L (ref 135–145)
TCO2: 23 mmol/L (ref 0–100)

## 2010-09-04 NOTE — Op Note (Signed)
  NAME:  Jeffery Turner, Jeffery Turner                  ACCOUNT NO.:  [id number]  MEDICAL RECORD NO.:  [id number]           PATIENT TYPE:  LOCATION:                                 FACILITY:  PHYSICIAN:  Marianny Goris E. 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  OPERATION:  Microlaryngoscopy with laser excision of left anterior vocal cord lesion.  SURGEON:  Kristine Garbe. Ezzard Standing, MD.  ANESTHESIA:  General endotracheal.  COMPLICATIONS:  None.  CLINICAL NOTE:  [name] is a 70 year old gentleman who has been having hemoptysis for the last several weeks as well as hoarseness.  On exam in office, he has had right anterior vocal cord lesion.  He is taken to the operating room at this time for excisional biopsy of right anterior vocal cord lesion.  DESCRIPTION OF PROCEDURE:  After adequate endotracheal anesthesia, direct laryngoscopy was performed.  The hypopharyngeal __________ evaluation of vocal cords.  The left vocal cord was normal in appearance.  The right anterior vocal cord had a polypoid exophytic lesion involving the right anterior vocal cord extending up to the anterior commissure.  There was some hemorrhage around the base of the lesion.  The lesion was excised from the right anterior vocal cord using a laser at 67 watts.  It was sent to pathology.  After obtaining adequate hemostasis with the laser, the procedure was completed.  Photos were obtained.  The patient was subsequently awakened from anesthesia and transferred to recovery room, postop doing well.  He received 1 gram Ancef IV preoperatively.  The patient was subsequently awakened from anesthesia and transferred to recovery room and doing well.  The patient was discharged home later this  morning.  He will follow up in our office in 2 weeks for recheck.  He will call our office next week concerning results of the excisional lesion.  Discharge medications will include Nexium 40 mg daily for 3 weeks, Tylenol, and Vicodin p.r.n. pain.          ______________________________ Kristine Garbe. Ezzard Standing, M.D.     CEN/MEDQ  D:  08/28/2010  T:  08/28/2010  Job:  [id number]  cc:   Veverly Fells. Excell Seltzer, MD [facility]  Electronically Signed by Dillard Cannon M.D. on 09/04/2010 11:13:39 AM

## 2010-10-05 ENCOUNTER — Other Ambulatory Visit (INDEPENDENT_AMBULATORY_CARE_PROVIDER_SITE_OTHER): Payer: Medicare Other | Admitting: *Deleted

## 2010-10-05 DIAGNOSIS — E785 Hyperlipidemia, unspecified: Secondary | ICD-10-CM

## 2010-10-05 LAB — LIPID PANEL
Cholesterol: 211 mg/dL — ABNORMAL HIGH (ref 0–200)
HDL: 34.7 mg/dL — ABNORMAL LOW (ref >39.00)
Triglycerides: 116 mg/dL (ref 0.0–149.0)

## 2010-10-05 LAB — HEPATIC FUNCTION PANEL
ALT: 16 U/L (ref 0–53)
AST: 16 U/L (ref 0–37)
Total Bilirubin: 0.3 mg/dL (ref 0.3–1.2)
Total Protein: 6.5 g/dL (ref 6.0–8.3)

## 2010-10-05 LAB — LDL CHOLESTEROL, DIRECT: Direct LDL: 152.5 mg/dL

## 2010-10-07 ENCOUNTER — Encounter: Payer: Self-pay | Admitting: *Deleted

## 2010-10-07 ENCOUNTER — Encounter: Payer: Self-pay | Admitting: Cardiovascular Disease

## 2010-10-08 ENCOUNTER — Encounter: Payer: Self-pay | Admitting: Cardiovascular Disease

## 2010-10-08 ENCOUNTER — Ambulatory Visit (INDEPENDENT_AMBULATORY_CARE_PROVIDER_SITE_OTHER): Payer: Medicare Other | Admitting: Cardiovascular Disease

## 2010-10-08 DIAGNOSIS — I2510 Atherosclerotic heart disease of native coronary artery without angina pectoris: Secondary | ICD-10-CM

## 2010-10-08 DIAGNOSIS — E785 Hyperlipidemia, unspecified: Secondary | ICD-10-CM

## 2010-10-08 NOTE — Assessment & Plan Note (Signed)
Lipitor reviewed and he is above goal. He has a lot of difficulty with statin intolerance secondary to myalgias. He currently is taking Crestor 5 mg on most days but occasionally has to take a day or 2 off when his myalgias are particularly bad. I don't think he will tolerate dose escalation and I don't think changing drugs will help as a 5 mg dose of Crestor seems to be better tolerated than most statin agents. Will continue current therapy

## 2010-10-08 NOTE — Patient Instructions (Signed)
Your physician wants you to follow-up in: 6 months  You will receive a reminder letter in the mail two months in advance. If you don't receive a letter, please call our office to schedule the follow-up appointment.  Your physician recommends that you continue on your current medications as directed. Please refer to the Current Medication list given to you today.  

## 2010-10-08 NOTE — Progress Notes (Signed)
HPI:  Jeffery Turner is a 70 yo male with a history of myocardial infarction in 1997 treated with stenting to the circumflex.  He presented to [facility] on 1/28 with symptoms of unstable angina.  He ruled out for myocardial infarction.  Cardiac catheterization demonstrated significant disease in the LAD and RCA and he underwent drug-eluting stent placement to the mid RCA and mid LAD.  His ejection fraction was 60%.    The patient developed hemoptysis. 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 He's had no more problems with hemoptysis since then. He really feels quite well at present. He denies chest pain or pressure. He denies dyspnea, edema, palpitations, lightheadedness, or syncope. He has a remote history of Plavix intolerance but is tolerating it without difficulty at present.  Outpatient Encounter Prescriptions as of 10/08/2010  Medication Sig Dispense Refill  . aspirin 81 MG tablet Take 81 mg by mouth daily.        . carvedilol (COREG) 3.125 MG tablet Take 3.125 mg by mouth 2 (two) times daily with a meal.        . clopidogrel (PLAVIX) 75 MG tablet Take 75 mg by mouth daily.        . Multiple Vitamin (MULTIVITAMIN) capsule Take 1 capsule by mouth daily.        . nitroGLYCERIN (NITROSTAT) 0.4 MG SL tablet Place 0.4 mg under the tongue every 5 (five) minutes as needed.        . rosuvastatin (CRESTOR) 5 MG tablet Take 5 mg by mouth daily.          Allergies  Allergen Reactions  . Codeine     Past Medical History  Diagnosis Date  . CAD (coronary artery disease)     s/p stent to CFX 1997 2/2 MI s/p DES to [facility] and DES mLAD 07/14/10 2/2 [place] cath 07/14/10: dLAD 60%; CFX stent ok; OM1 30-40%; pRCA 30%; PDA 40%; EF 60%  . Osteoarthritis   . Hyperlipidemia     ROS: Negative except as per HPI  BP 97/62  Pulse 60  Ht 5\' 8"  (1.727 m)  Wt 184 lb 12.8 oz (83.825 kg)  BMI 28.10 kg/m2  PHYSICAL EXAM: Pt is alert and oriented, NAD HEENT:  normal Neck: JVP - normal, carotids 2+= without bruits Lungs: CTA bilaterally CV: RRR without murmur or gallop Abd: soft, NT, Positive BS, no hepatomegaly Ext: no C/C/E, distal pulses intact and equal Skin: warm/dry no rash  ASSESSMENT AND PLAN:

## 2010-10-08 NOTE — Assessment & Plan Note (Signed)
The patient is status post multivessel drug-eluting stent placement after presenting with non-ST elevation infarction. He is doing well without recurrent angina. We'll continue his current medical program. Fortunately he is having no further bleeding problems.

## 2011-04-08 ENCOUNTER — Encounter: Payer: Self-pay | Admitting: Cardiovascular Disease

## 2011-04-08 ENCOUNTER — Ambulatory Visit (INDEPENDENT_AMBULATORY_CARE_PROVIDER_SITE_OTHER): Payer: Medicare Other | Admitting: Cardiovascular Disease

## 2011-04-08 DIAGNOSIS — E7800 Pure hypercholesterolemia, unspecified: Secondary | ICD-10-CM

## 2011-04-08 DIAGNOSIS — I2510 Atherosclerotic heart disease of native coronary artery without angina pectoris: Secondary | ICD-10-CM

## 2011-04-08 DIAGNOSIS — E785 Hyperlipidemia, unspecified: Secondary | ICD-10-CM

## 2011-04-08 LAB — LIPID PANEL
Cholesterol: 183 mg/dL (ref 0–200)
HDL: 41.3 mg/dL (ref >39.00)
LDL Cholesterol: 124 mg/dL — ABNORMAL HIGH (ref 0–99)
Triglycerides: 89 mg/dL (ref 0.0–149.0)
VLDL: 17.8 mg/dL (ref 0.0–40.0)

## 2011-04-08 LAB — HEPATIC FUNCTION PANEL
ALT: 11 U/L (ref 0–53)
Albumin: 4.2 g/dL (ref 3.5–5.2)
Bilirubin, Direct: 0 mg/dL (ref 0.0–0.3)
Total Protein: 7.5 g/dL (ref 6.0–8.3)

## 2011-04-08 NOTE — Progress Notes (Signed)
HPI:  This is a 70 year old gentleman presented for followup evaluation.  The patient has a history of CAD. He had remote stenting of left circumflex. He presented in January 2012 unstable angina and was treated with a drug-eluting stent in the right coronary artery and the LAD at that time. He developed severe epistaxis and bleeding from a vocal cord polyp on CAT scan. He was changed to Plavix and ultimately had this nodule excised. He's had no further bleeding problems. The patient feels very well. He does regular exercise and he has lost a lot of weight through diet and exercise. He denies exertional dyspnea. He does have mild angina at high level exertion when his heart rate gets greater than 120. He describes a tightness in his chest and pain in his teeth. This resolved with slowing down. There's been no change in the pattern. He has no symptoms of low level activity.  Outpatient Encounter Prescriptions as of 04/08/2011  Medication Sig Dispense Refill  . aspirin 81 MG tablet Take 81 mg by mouth daily.        . carvedilol (COREG) 3.125 MG tablet Take 3.125 mg by mouth 2 (two) times daily with a meal.        . clopidogrel (PLAVIX) 75 MG tablet Take 75 mg by mouth daily.        . Multiple Vitamin (MULTIVITAMIN) capsule Take 1 capsule by mouth daily.        . nitroGLYCERIN (NITROSTAT) 0.4 MG SL tablet Place 0.4 mg under the tongue every 5 (five) minutes as needed.        . rosuvastatin (CRESTOR) 5 MG tablet Take 5 mg by mouth daily.          Allergies  Allergen Reactions  . Codeine     Past Medical History  Diagnosis Date  . CAD (coronary artery disease)     s/p stent to CFX 1997 2/2 MI s/p DES to [facility] and DES mLAD 07/14/10 2/2 [place] cath 07/14/10: dLAD 60%; CFX stent ok; OM1 30-40%; pRCA 30%; PDA 40%; EF 60%  . Osteoarthritis   . Hyperlipidemia     ROS: Negative except as per HPI  BP 100/70  Pulse 68  Resp 18  Ht 5\' 8"  (1.727 m)  Wt 176 lb (79.833 kg)  BMI 26.76 kg/m2  PHYSICAL  EXAM: Pt is alert and oriented, NAD HEENT: normal Neck: JVP - normal, carotids 2+= without bruits Lungs: CTA bilaterally CV: RRR without murmur or gallop Abd: soft, NT, Positive BS, no hepatomegaly Ext: no C/C/E, distal pulses intact and equal Skin: warm/dry no rash  EKG:  Normal sinus rhythm 70 beats per minute, nonspecific intraventricular conduction delay, no other abnormalities noted.  ASSESSMENT AND PLAN:

## 2011-04-08 NOTE — Patient Instructions (Signed)
Your physician wants you to follow-up in: 6 MONTHS.  You will receive a reminder letter in the mail two months in advance. If you don't receive a letter, please call our office to schedule the follow-up appointment.   Your physician recommends that you continue on your current medications as directed. Please refer to the Current Medication list given to you today.  Your physician recommends that [place] lab work today: Lipid and Liver

## 2011-04-08 NOTE — Assessment & Plan Note (Signed)
Lipids have been above goal but he's been tolerating 5 mg of Crestor better and he has lost a good bit of weight. We need to repeat lipids and LFTs.

## 2011-04-08 NOTE — Assessment & Plan Note (Signed)
The patient has stable class II anginal symptoms. He is on a good medical program. He is in a great effort at diet and exercise he will continue with his program. He should remain on dual antiplatelet therapy with aspirin and Plavix. I like to see him back in 6 months.

## 2011-04-16 ENCOUNTER — Telehealth: Payer: Self-pay | Admitting: Cardiovascular Disease

## 2011-04-16 NOTE — Telephone Encounter (Signed)
[phone], blood test results, cholesterol

## 2011-04-16 NOTE — Telephone Encounter (Signed)
11/2--lab results given--nt

## 2014-06-24 DIAGNOSIS — R079 Chest pain, unspecified: Secondary | ICD-10-CM | POA: Diagnosis not present

## 2014-06-24 DIAGNOSIS — R0789 Other chest pain: Secondary | ICD-10-CM | POA: Diagnosis not present

## 2014-06-24 DIAGNOSIS — I2510 Atherosclerotic heart disease of native coronary artery without angina pectoris: Secondary | ICD-10-CM | POA: Diagnosis not present

## 2014-10-19 DIAGNOSIS — H9201 Otalgia, right ear: Secondary | ICD-10-CM | POA: Diagnosis not present

## 2014-10-19 DIAGNOSIS — H109 Unspecified conjunctivitis: Secondary | ICD-10-CM | POA: Diagnosis not present

## 2014-10-19 DIAGNOSIS — H6121 Impacted cerumen, right ear: Secondary | ICD-10-CM | POA: Diagnosis not present

## 2014-10-19 DIAGNOSIS — Z719 Counseling, unspecified: Secondary | ICD-10-CM | POA: Diagnosis not present

## 2014-10-28 DIAGNOSIS — H538 Other visual disturbances: Secondary | ICD-10-CM | POA: Diagnosis not present

## 2014-10-28 DIAGNOSIS — H9190 Unspecified hearing loss, unspecified ear: Secondary | ICD-10-CM | POA: Diagnosis not present

## 2014-10-28 DIAGNOSIS — J0140 Acute pansinusitis, unspecified: Secondary | ICD-10-CM | POA: Diagnosis not present

## 2014-10-28 DIAGNOSIS — J019 Acute sinusitis, unspecified: Secondary | ICD-10-CM | POA: Diagnosis not present

## 2014-10-28 DIAGNOSIS — H1031 Unspecified acute conjunctivitis, right eye: Secondary | ICD-10-CM | POA: Diagnosis not present

## 2014-10-28 DIAGNOSIS — H05019 Cellulitis of unspecified orbit: Secondary | ICD-10-CM | POA: Diagnosis not present

## 2014-10-28 DIAGNOSIS — H748X1 Other specified disorders of right middle ear and mastoid: Secondary | ICD-10-CM | POA: Diagnosis not present

## 2014-10-28 DIAGNOSIS — H66001 Acute suppurative otitis media without spontaneous rupture of ear drum, right ear: Secondary | ICD-10-CM | POA: Diagnosis not present

## 2014-10-28 DIAGNOSIS — H9201 Otalgia, right ear: Secondary | ICD-10-CM | POA: Diagnosis not present

## 2014-10-28 DIAGNOSIS — H05011 Cellulitis of right orbit: Secondary | ICD-10-CM | POA: Diagnosis not present

## 2014-10-28 DIAGNOSIS — H0520 Unspecified exophthalmos: Secondary | ICD-10-CM | POA: Diagnosis not present

## 2014-10-28 DIAGNOSIS — J329 Chronic sinusitis, unspecified: Secondary | ICD-10-CM | POA: Diagnosis not present

## 2014-10-28 DIAGNOSIS — H5711 Ocular pain, right eye: Secondary | ICD-10-CM | POA: Diagnosis not present

## 2014-10-30 DIAGNOSIS — H16121 Filamentary keratitis, right eye: Secondary | ICD-10-CM | POA: Diagnosis not present

## 2014-11-01 DIAGNOSIS — H16121 Filamentary keratitis, right eye: Secondary | ICD-10-CM | POA: Diagnosis not present

## 2014-11-13 DIAGNOSIS — H16121 Filamentary keratitis, right eye: Secondary | ICD-10-CM | POA: Diagnosis not present

## 2014-11-27 DIAGNOSIS — H16121 Filamentary keratitis, right eye: Secondary | ICD-10-CM | POA: Diagnosis not present

## 2015-01-17 DIAGNOSIS — H9201 Otalgia, right ear: Secondary | ICD-10-CM | POA: Diagnosis not present

## 2015-01-31 DIAGNOSIS — S0181XA Laceration without foreign body of other part of head, initial encounter: Secondary | ICD-10-CM | POA: Diagnosis not present

## 2015-03-04 DIAGNOSIS — J018 Other acute sinusitis: Secondary | ICD-10-CM | POA: Diagnosis not present

## 2015-03-04 DIAGNOSIS — H6121 Impacted cerumen, right ear: Secondary | ICD-10-CM | POA: Diagnosis not present

## 2015-03-25 DIAGNOSIS — H6121 Impacted cerumen, right ear: Secondary | ICD-10-CM | POA: Diagnosis not present

## 2015-03-25 DIAGNOSIS — J301 Allergic rhinitis due to pollen: Secondary | ICD-10-CM | POA: Diagnosis not present

## 2015-04-28 DIAGNOSIS — H66001 Acute suppurative otitis media without spontaneous rupture of ear drum, right ear: Secondary | ICD-10-CM | POA: Diagnosis not present

## 2015-05-16 DIAGNOSIS — H6611 Chronic tubotympanic suppurative otitis media, right ear: Secondary | ICD-10-CM | POA: Diagnosis not present

## 2015-05-16 DIAGNOSIS — J329 Chronic sinusitis, unspecified: Secondary | ICD-10-CM | POA: Diagnosis not present

## 2015-05-16 DIAGNOSIS — J342 Deviated nasal septum: Secondary | ICD-10-CM | POA: Diagnosis not present

## 2015-05-16 DIAGNOSIS — H7491 Unspecified disorder of right middle ear and mastoid: Secondary | ICD-10-CM | POA: Diagnosis not present

## 2015-05-30 DIAGNOSIS — H663X1 Other chronic suppurative otitis media, right ear: Secondary | ICD-10-CM | POA: Diagnosis not present

## 2015-06-18 DIAGNOSIS — H6981 Other specified disorders of Eustachian tube, right ear: Secondary | ICD-10-CM | POA: Diagnosis not present

## 2015-06-18 DIAGNOSIS — H65491 Other chronic nonsuppurative otitis media, right ear: Secondary | ICD-10-CM | POA: Diagnosis not present

## 2015-06-18 DIAGNOSIS — H9191 Unspecified hearing loss, right ear: Secondary | ICD-10-CM | POA: Diagnosis not present

## 2015-07-28 DIAGNOSIS — H9193 Unspecified hearing loss, bilateral: Secondary | ICD-10-CM | POA: Diagnosis not present

## 2015-07-31 DIAGNOSIS — H9122 Sudden idiopathic hearing loss, left ear: Secondary | ICD-10-CM | POA: Diagnosis not present

## 2015-07-31 DIAGNOSIS — H6522 Chronic serous otitis media, left ear: Secondary | ICD-10-CM | POA: Diagnosis not present

## 2015-08-26 ENCOUNTER — Other Ambulatory Visit: Payer: Self-pay | Admitting: Otolaryngology

## 2015-08-26 DIAGNOSIS — H9191 Unspecified hearing loss, right ear: Secondary | ICD-10-CM

## 2015-09-10 ENCOUNTER — Telehealth: Payer: Self-pay | Admitting: Cardiovascular Disease

## 2015-09-10 NOTE — Telephone Encounter (Signed)
Susie Nydia Bouton) office is calling to get information on the type of stents and the manfacturing name , model and serial #'s and where the stents are located in Jeff[place] Turner . Please call   Thanks

## 2015-09-10 NOTE — Telephone Encounter (Signed)
Stent placed to Left Circumflex in 1997--no documentation available in Epic  07/13/2010  Promus DES mid RCA and mid LAD  I spoke with Susie and made her aware of the information that was available to me through Epic.  If she requires further information then she will have to contact medical records at [facility].

## 2015-09-26 ENCOUNTER — Ambulatory Visit
Admission: RE | Admit: 2015-09-26 | Discharge: 2015-09-26 | Disposition: A | Discharge status: Home / Self Care | Payer: Medicare HMO | Source: Ambulatory Visit | Attending: Otolaryngology | Admitting: Otolaryngology

## 2015-09-26 ENCOUNTER — Other Ambulatory Visit: Payer: Self-pay | Admitting: Otolaryngology

## 2015-09-26 DIAGNOSIS — H9191 Unspecified hearing loss, right ear: Secondary | ICD-10-CM

## 2015-09-26 DIAGNOSIS — Z77018 Contact with and (suspected) exposure to other hazardous metals: Secondary | ICD-10-CM

## 2015-09-26 MED ORDER — GADOBENATE DIMEGLUMINE 529 MG/ML IV SOLN
18.0000 mL | Freq: Once | INTRAVENOUS | Status: AC | PRN
  Administered 2015-09-26: 18 mL via INTRAVENOUS

## 2016-03-15 DIAGNOSIS — M25562 Pain in left knee: Secondary | ICD-10-CM | POA: Diagnosis not present

## 2016-03-15 DIAGNOSIS — M170 Bilateral primary osteoarthritis of knee: Secondary | ICD-10-CM | POA: Diagnosis not present

## 2016-08-24 DIAGNOSIS — L570 Actinic keratosis: Secondary | ICD-10-CM | POA: Diagnosis not present

## 2018-01-25 DIAGNOSIS — R5383 Other fatigue: Secondary | ICD-10-CM | POA: Diagnosis not present

## 2018-01-25 DIAGNOSIS — Z0001 Encounter for general adult medical examination with abnormal findings: Secondary | ICD-10-CM | POA: Diagnosis not present

## 2018-01-25 DIAGNOSIS — E782 Mixed hyperlipidemia: Secondary | ICD-10-CM | POA: Diagnosis not present

## 2018-01-25 DIAGNOSIS — I208 Other forms of angina pectoris: Secondary | ICD-10-CM | POA: Diagnosis not present

## 2018-01-26 DIAGNOSIS — R799 Abnormal finding of blood chemistry, unspecified: Secondary | ICD-10-CM | POA: Diagnosis not present

## 2018-01-26 DIAGNOSIS — Z125 Encounter for screening for malignant neoplasm of prostate: Secondary | ICD-10-CM | POA: Diagnosis not present

## 2018-01-26 DIAGNOSIS — R5383 Other fatigue: Secondary | ICD-10-CM | POA: Diagnosis not present

## 2018-01-26 DIAGNOSIS — E872 Acidosis: Secondary | ICD-10-CM | POA: Diagnosis not present

## 2018-02-06 DIAGNOSIS — I209 Angina pectoris, unspecified: Secondary | ICD-10-CM

## 2018-02-06 HISTORY — DX: Angina pectoris, unspecified: I20.9

## 2018-02-07 DIAGNOSIS — I499 Cardiac arrhythmia, unspecified: Secondary | ICD-10-CM | POA: Diagnosis not present

## 2018-06-20 DIAGNOSIS — L814 Other melanin hyperpigmentation: Secondary | ICD-10-CM | POA: Diagnosis not present

## 2018-06-20 DIAGNOSIS — D225 Melanocytic nevi of trunk: Secondary | ICD-10-CM | POA: Diagnosis not present

## 2018-06-20 DIAGNOSIS — D2239 Melanocytic nevi of other parts of face: Secondary | ICD-10-CM | POA: Diagnosis not present

## 2018-06-20 DIAGNOSIS — L821 Other seborrheic keratosis: Secondary | ICD-10-CM | POA: Diagnosis not present

## 2018-07-12 DIAGNOSIS — L821 Other seborrheic keratosis: Secondary | ICD-10-CM | POA: Diagnosis not present

## 2018-07-12 DIAGNOSIS — L820 Inflamed seborrheic keratosis: Secondary | ICD-10-CM | POA: Diagnosis not present

## 2019-05-01 DIAGNOSIS — I70213 Atherosclerosis of native arteries of extremities with intermittent claudication, bilateral legs: Secondary | ICD-10-CM | POA: Diagnosis not present

## 2019-05-07 DIAGNOSIS — M25561 Pain in right knee: Secondary | ICD-10-CM | POA: Diagnosis not present

## 2019-05-08 DIAGNOSIS — M25561 Pain in right knee: Secondary | ICD-10-CM | POA: Diagnosis not present

## 2019-05-08 DIAGNOSIS — M170 Bilateral primary osteoarthritis of knee: Secondary | ICD-10-CM | POA: Diagnosis not present

## 2019-05-08 DIAGNOSIS — M1711 Unilateral primary osteoarthritis, right knee: Secondary | ICD-10-CM | POA: Diagnosis not present

## 2019-05-08 DIAGNOSIS — I70213 Atherosclerosis of native arteries of extremities with intermittent claudication, bilateral legs: Secondary | ICD-10-CM | POA: Diagnosis not present

## 2019-05-08 DIAGNOSIS — Z8679 Personal history of other diseases of the circulatory system: Secondary | ICD-10-CM | POA: Diagnosis not present

## 2019-08-28 ENCOUNTER — Encounter: Payer: Self-pay | Admitting: Family Medicine

## 2019-08-28 NOTE — Progress Notes (Deleted)
Established Patient Office Visit  Subjective:  Patient ID: Jeffery Turner, male    DOB: 14-May-1941  Age: 79 y.o. MRN: [id number]  CC:  Chief Complaint  Patient presents with  . Medication Management    HPI Jeffery Turner presents for ***  Past Medical History:  Diagnosis Date  . CAD (coronary artery disease)    s/p stent to CFX 1997 2/2 MI s/p DES to [facility] and DES mLAD 07/14/10 2/2 [place] cath 07/14/10: dLAD 60%; CFX stent ok; OM1 30-40%; pRCA 30%; PDA 40%; EF 60%  . Constant exophthalmos    right  . Hyperlipidemia   . MI (myocardial infarction) ([place])   . Osteoarthritis   . Transient visual loss, right     Past Surgical History:  Procedure Laterality Date  . CARDIAC CATHETERIZATION      Family History  Problem Relation Age of Onset  . Coronary artery disease Other   . Heart failure Mother   . Alzheimer's disease Mother     Social History   Socioeconomic History  . Marital status: Married    Spouse name: Not on file  . Number of children: Not on file  . Years of education: Not on file  . Highest education level: Not on file  Occupational History  . Not on file  Tobacco Use  . Smoking status: Former Research scientist (life sciences)  . Smokeless tobacco: Never Used  . Tobacco comment: quit 11 yrs ago  Substance and Sexual Activity  . Alcohol use: No  . Drug use: No  . Sexual activity: Not on file  Other Topics Concern  . Not on file  Social History Narrative   He lives in [place] with family.  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         Social Determinants of Health   Financial Resource Strain:   . Difficulty of Paying Living Expenses:   Food Insecurity:   . Worried About Charity fundraiser in the Last Year:   . Arboriculturist in the Last Year:   Transportation Needs:   . Film/video editor (Medical):   Marland Kitchen Lack of Transportation (Non-Medical):   Physical Activity:   .  Days of Exercise per Week:   . Minutes of Exercise per Session:   Stress:   . Feeling of Stress :   Social Connections:   . Frequency of Communication with Friends and Family:   . Frequency of Social Gatherings with Friends and Family:   . Attends Religious Services:   . Active Member of Clubs or Organizations:   . Attends Archivist Meetings:   Marland Kitchen Marital Status:   Intimate Partner Violence:   . Fear of Current or Ex-Partner:   . Emotionally Abused:   Marland Kitchen Physically Abused:   . Sexually Abused:     Outpatient Medications Prior to Visit  Medication Sig Dispense Refill  . aspirin 81 MG tablet Take 81 mg by mouth daily.      . carvedilol (COREG) 3.125 MG tablet Take 3.125 mg by mouth 2 (two) times daily with a meal.      . clopidogrel (PLAVIX) 75 MG tablet Take 75 mg by mouth daily.      . Multiple Vitamin (MULTIVITAMIN) capsule Take 1 capsule by mouth daily.      . nitroGLYCERIN (NITROSTAT) 0.4 MG SL  tablet Place 0.4 mg under the tongue every 5 (five) minutes as needed.      . rosuvastatin (CRESTOR) 5 MG tablet Take 5 mg by mouth daily.       No facility-administered medications prior to visit.    Allergies  Allergen Reactions  . Codeine     ROS Review of Systems  Constitutional: Negative for chills, diaphoresis, fatigue and fever.  HENT: Negative for congestion, ear pain and sore throat.   Respiratory: Negative for cough and shortness of breath.   Cardiovascular: Negative for chest pain and leg swelling.  Gastrointestinal: Negative for abdominal pain, constipation, diarrhea, nausea and vomiting.  Genitourinary: Negative for dysuria and urgency.  Musculoskeletal: Negative for arthralgias and myalgias.  Neurological: Negative for dizziness and headaches.  Psychiatric/Behavioral: Negative for dysphoric mood.      Objective:    Physical Exam  Constitutional: He appears well-developed and well-nourished.  Cardiovascular: Normal rate, regular rhythm and normal  heart sounds.  Pulmonary/Chest: Effort normal and breath sounds normal.  Neurological: He is alert.  Psychiatric: He has a normal mood and affect. His behavior is normal.    There were no vitals taken for this visit. Wt Readings from Last 3 Encounters:  04/08/11 176 lb (79.8 kg)  10/08/10 184 lb 12.8 oz (83.8 kg)  08/12/10 200 lb 12 oz (91.1 kg)     Health Maintenance Due  Topic Date Due  . TETANUS/TDAP  Never done  . PNA vac Low Risk Adult (1 of 2 - PCV13) Never done  . INFLUENZA VACCINE  Never done    There are no preventive care reminders to display for this patient.  Lab Results  Component Value Date   TSH 2.514 07/11/2010   Lab Results  Component Value Date   WBC 8.4 08/12/2010   HGB 13.9 08/28/2010   HCT 41.0 08/28/2010   MCV 91.9 08/12/2010   PLT 262.0 08/12/2010   Lab Results  Component Value Date   NA 138 08/28/2010   K 4.3 08/28/2010   CO2 25 07/14/2010   GLUCOSE 96 08/28/2010   BUN 22 08/28/2010   CREATININE 1.1 08/28/2010   BILITOT 0.6 04/08/2011   ALKPHOS 46 04/08/2011   AST 14 04/08/2011   ALT 11 04/08/2011   PROT 7.5 04/08/2011   ALBUMIN 4.2 04/08/2011   CALCIUM 8.7 07/14/2010   Lab Results  Component Value Date   CHOL 183 04/08/2011   Lab Results  Component Value Date   HDL 41.30 04/08/2011   Lab Results  Component Value Date   LDLCALC 124 (H) 04/08/2011   Lab Results  Component Value Date   TRIG 89.0 04/08/2011   Lab Results  Component Value Date   CHOLHDL 4 04/08/2011   Lab Results  Component Value Date   HGBA1C (H) 07/11/2010    5.9 (NOTE)                                                                       According to the ADA Clinical Practice Recommendations for 2011, when HbA1c is used as a screening test:   >=6.5%   Diagnostic of Diabetes Mellitus           (if abnormal result  is confirmed)  5.7-6.4%  Increased risk of developing Diabetes Mellitus  References:Diagnosis and Classification of Diabetes  [place],Diabetes [id number] 1):S62-S69 and Standards of Medical Care in         Diabetes - 2011,Diabetes [id number]  (Suppl 1):S11-S61.      Assessment & Plan:   Problem List Items Addressed This Visit    None      No orders of the defined types were placed in this encounter.   Follow-up: No follow-ups on file.    Arsenio Katz, CMA

## 2019-08-29 ENCOUNTER — Ambulatory Visit (INDEPENDENT_AMBULATORY_CARE_PROVIDER_SITE_OTHER): Payer: Medicare HMO | Admitting: Family Medicine

## 2019-08-29 ENCOUNTER — Ambulatory Visit: Payer: Medicare HMO | Admitting: Physician Assistant

## 2019-08-29 ENCOUNTER — Telehealth: Payer: Self-pay

## 2019-08-29 DIAGNOSIS — R0602 Shortness of breath: Secondary | ICD-10-CM

## 2019-08-29 NOTE — Telephone Encounter (Signed)
Jeffery Turner is complaining of chest pain and shortness of breath.  He has a history of an MI in the past.  He was advised to go immediately to the ED for evaluation and treatment.

## 2019-09-18 NOTE — Progress Notes (Signed)
Established Patient Office Visit  Subjective:  Patient ID: Jeffery Turner, male    DOB: 04/04/1941  Age: 79 y.o. MRN: [id number]  CC:  Chief Complaint  Patient presents with  . Medication Management    Discuss blood thinners  . Leg Pain    Bilateral Lower legs gradually worsened over the past several months    HPI [name] has history of CAD with stents in 1996 and 2012 presents c/o BL jaw pain with exertion. He recognizes these symptoms from his previous heart attacks. He has not had any symptoms for 3 weeks. He has some associated shortness of breath. Denies associated chest pain, nausea, vomiting, diaphoresis. He also develops BL leg pain with ambulation which resolves with rest. He has refused statins in the past due to friend's experience, not his own. He has not followed up with cardiology. He has ntg he keeps in his freezer that is likely expired, but has not tried it.   Past Medical History:  Diagnosis Date  . CAD (coronary artery disease)    s/p stent to CFX 1997 2/2 MI s/p DES to [facility] and DES mLAD 07/14/10 2/2 [place] cath 07/14/10: dLAD 60%; CFX stent ok; OM1 30-40%; pRCA 30%; PDA 40%; EF 60%  . Constant exophthalmos    right  . Hyperlipidemia   . MI (myocardial infarction) ([place])   . Osteoarthritis   . Transient visual loss, right     Past Surgical History:  Procedure Laterality Date  . CARDIAC CATHETERIZATION      Family History  Problem Relation Age of Onset  . Coronary artery disease Other   . Heart failure Mother   . Alzheimer's disease Mother     Social History   Socioeconomic History  . Marital status: Married    Spouse name: Not on file  . Number of children: Not on file  . Years of education: Not on file  . Highest education level: Not on file  Occupational History  . Not on file  Tobacco Use  . Smoking status: Former Research scientist (life sciences)  . Smokeless tobacco: Never Used  . Tobacco comment: quit 11 yrs ago  Substance and Sexual Activity  . Alcohol use: No   . Drug use: No  . Sexual activity: Not on file  Other Topics Concern  . Not on file  Social History Narrative   He lives in [place] with family.  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         Social Determinants of Health   Financial Resource Strain:   . Difficulty of Paying Living Expenses:   Food Insecurity:   . Worried About Charity fundraiser in the Last Year:   . Arboriculturist in the Last Year:   Transportation Needs:   . Film/video editor (Medical):   Marland Kitchen Lack of Transportation (Non-Medical):   Physical Activity:   . Days of Exercise per Week:   . Minutes of Exercise per Session:   Stress:   . Feeling of Stress :   Social Connections:   . Frequency of Communication with Friends and Family:   . Frequency of Social Gatherings with Friends and Family:   . Attends Religious Services:   . Active Member of Clubs or Organizations:   . Attends Archivist Meetings:   Marland Kitchen Marital Status:  Intimate Partner Violence:   . Fear of Current or Ex-Partner:   . Emotionally Abused:   Marland Kitchen Physically Abused:   . Sexually Abused:     Outpatient Medications Prior to Visit  Medication Sig Dispense Refill  . Multiple Vitamin (MULTIVITAMIN) capsule Take 1 capsule by mouth daily.      . clopidogrel (PLAVIX) 75 MG tablet Take 75 mg by mouth daily.      . IODINE STRONG EX Take by mouth.    Javier Docker Oil (OMEGA-3) 500 MG CAPS Take by mouth.    Marland Kitchen NATTOKINASE PO Take by mouth.    . nitroGLYCERIN (NITROSTAT) 0.4 MG SL tablet Place 0.4 mg under the tongue every 5 (five) minutes as needed.       No facility-administered medications prior to visit.    Allergies  Allergen Reactions  . Codeine     ROS Review of Systems  Constitutional: Negative for chills, diaphoresis, fatigue and fever.  HENT: Negative for congestion, ear pain and sore throat.   Respiratory: Positive for  shortness of breath (with exertion). Negative for cough.   Cardiovascular: Positive for leg swelling. Negative for chest pain.  Gastrointestinal: Negative for abdominal pain, constipation, diarrhea, nausea and vomiting.  Genitourinary: Negative for dysuria and urgency.  Musculoskeletal: Negative for arthralgias and myalgias.  Neurological: Negative for dizziness and headaches.  Psychiatric/Behavioral: Negative for dysphoric mood.      Objective:    Physical Exam  Constitutional: He appears well-developed and well-nourished.  Cardiovascular: Normal rate, regular rhythm, normal heart sounds and intact distal pulses.  Pulmonary/Chest: Effort normal and breath sounds normal.  Abdominal: Soft. Bowel sounds are normal. There is no abdominal tenderness.  Neurological: He is alert.  Psychiatric: He has a normal mood and affect. His behavior is normal.    BP 118/62 (BP Location: Right Arm, Patient Position: Sitting)   Pulse 82   Temp (!) 97.2 F (36.2 C) (Temporal)   Ht 5\' 11"  (1.803 m)   Wt 208 lb (94.3 kg)   SpO2 98%   BMI 29.01 kg/m  Wt Readings from Last 3 Encounters:  09/19/19 208 lb (94.3 kg)  04/08/11 176 lb (79.8 kg)  10/08/10 184 lb 12.8 oz (83.8 kg)     Health Maintenance Due  Topic Date Due  . TETANUS/TDAP  Never done  . PNA vac Low Risk Adult (1 of 2 - PCV13) Never done    There are no preventive care reminders to display for this patient.  Lab Results  Component Value Date   TSH 2.514 07/11/2010   Lab Results  Component Value Date   WBC 8.4 08/12/2010   HGB 13.9 08/28/2010   HCT 41.0 08/28/2010   MCV 91.9 08/12/2010   PLT 262.0 08/12/2010   Lab Results  Component Value Date   NA 138 08/28/2010   K 4.3 08/28/2010   CO2 25 07/14/2010   GLUCOSE 96 08/28/2010   BUN 22 08/28/2010   CREATININE 1.1 08/28/2010   BILITOT 0.6 04/08/2011   ALKPHOS 46 04/08/2011   AST 14 04/08/2011   ALT 11 04/08/2011   PROT 7.5 04/08/2011   ALBUMIN 4.2 04/08/2011    CALCIUM 8.7 07/14/2010   Lab Results  Component Value Date   CHOL 183 04/08/2011   Lab Results  Component Value Date   HDL 41.30 04/08/2011   Lab Results  Component Value Date   LDLCALC 124 (H) 04/08/2011   Lab Results  Component Value Date   TRIG 89.0 04/08/2011  Lab Results  Component Value Date   CHOLHDL 4 04/08/2011   Lab Results  Component Value Date   HGBA1C (H) 07/11/2010    5.9 (NOTE)                                                                       According to the ADA Clinical Practice Recommendations for 2011, when HbA1c is used as a screening test:   >=6.5%   Diagnostic of Diabetes Mellitus           (if abnormal result  is confirmed)  5.7-6.4%   Increased risk of developing Diabetes Mellitus  References:Diagnosis and Classification of Diabetes Mellitus,Diabetes [id number] 1):S62-S69 and Standards of Medical Care in         Diabetes - 2011,Diabetes Care,2011,34  (Suppl 1):S11-S61.      Assessment & Plan:  1. Coronary artery disease of native artery of native heart with stable angina pectoris ([place]) Start medicines below. Referral to cardiology. Patient to take ntg if chest pain/jaw pain recurs and if does not resolve after one, patient to call 911. - nitroGLYCERIN (NITROSTAT) 0.4 MG SL tablet; Place 1 tablet (0.4 mg total) under the tongue every 5 (five) minutes as needed. If require 2, recommend call 911.  Dispense: 50 tablet; Refill: 0 - metoprolol succinate (TOPROL-XL) 25 MG 24 hr tablet; Take 1 tablet (25 mg total) by mouth daily.  Dispense: 90 tablet; Refill: 3 - rosuvastatin (CRESTOR) 10 MG tablet; Take 1 tablet (10 mg total) by mouth daily.  Dispense: 90 tablet; Refill: 3 - aspirin (ASPIRIN 81) 81 MG EC tablet; Take 1 tablet (81 mg total) by mouth daily. Swallow whole.  Dispense: 30 tablet; Refill: 12 - CBC with Differential/Platelet - Comprehensive metabolic panel - Lipid panel  2. Mixed hyperlipidemia Start crestor. Check lipid.  3.  Anginal equivalent ([place]) Refer to cardiology EKG - 1st degree AV block. No evidence of ischemia.   Follow-up: Return in about 1 day (around 09/20/2019) for fasting labwork.Rochel Brome, MD

## 2019-09-19 ENCOUNTER — Ambulatory Visit (INDEPENDENT_AMBULATORY_CARE_PROVIDER_SITE_OTHER): Payer: Medicare HMO | Admitting: Family Medicine

## 2019-09-19 ENCOUNTER — Encounter: Payer: Self-pay | Admitting: Family Medicine

## 2019-09-19 ENCOUNTER — Other Ambulatory Visit: Payer: Self-pay

## 2019-09-19 VITALS — BP 118/62 | HR 82 | Temp 97.2°F | Ht 71.0 in | Wt 208.0 lb

## 2019-09-19 DIAGNOSIS — E782 Mixed hyperlipidemia: Secondary | ICD-10-CM | POA: Diagnosis not present

## 2019-09-19 DIAGNOSIS — I25118 Atherosclerotic heart disease of native coronary artery with other forms of angina pectoris: Secondary | ICD-10-CM | POA: Diagnosis not present

## 2019-09-19 DIAGNOSIS — I208 Other forms of angina pectoris: Secondary | ICD-10-CM | POA: Diagnosis not present

## 2019-09-19 HISTORY — DX: Other forms of angina pectoris: I20.8

## 2019-09-19 MED ORDER — ASPIRIN 81 MG PO TBEC: 81.0000 mg | DELAYED_RELEASE_TABLET | Freq: Every day | ORAL | 12 refills | Status: DC

## 2019-09-19 MED ORDER — METOPROLOL SUCCINATE ER 25 MG PO TB24: 25.0000 mg | ORAL_TABLET | Freq: Every day | ORAL | 3 refills | Status: DC

## 2019-09-19 MED ORDER — NITROGLYCERIN 0.4 MG SL SUBL: 0.4000 mg | SUBLINGUAL_TABLET | SUBLINGUAL | 0 refills | Status: DC | PRN

## 2019-09-19 MED ORDER — ROSUVASTATIN CALCIUM 10 MG PO TABS
10.0000 mg | ORAL_TABLET | Freq: Every day | ORAL | 3 refills | Status: DC
Start: 2019-09-19 — End: 2020-01-17

## 2019-09-19 NOTE — Patient Instructions (Signed)
Referral to cardiology.  Recommend start baby aspirin 81 mg once daily, crestor 10 mg once daily, metoprolol er 25 mg once daily, and refill of nitroglycerin.  Heart Attack A heart attack occurs when blood and oxygen supply to the heart is cut off. A heart attack causes damage to the heart that cannot be fixed. A heart attack is also called a myocardial infarction, or MI. If you think you are having a heart attack, do not wait to see if the symptoms will go away. Get medical help right away. What are the causes? This condition may be caused by:  A fatty substance (plaque) in the blood vessels (arteries). This can block the flow of blood to the heart.  A blood clot in the blood vessels that go to the heart. The blood clot blocks blood flow.  Low blood pressure.  An abnormal heartbeat.  Some diseases, such as problems in red blood cells (anemia)orproblems in breathing (respiratory failure).  Tightening (spasm) of a blood vessel that cuts off blood to the heart.  A tear in a blood vessel of the heart.  High blood pressure. What increases the risk? The following factors may make you more likely to develop this condition:  Aging. The older you are, the higher your risk.  Having a personal or family history of chest pain, heart attack, stroke, or narrowing of the arteries in the legs, arms, head, or stomach (peripheral artery disease).  Being male.  Smoking.  Not getting regular exercise.  Being overweight or obese.  Having high blood pressure.  Having high cholesterol.  Having diabetes.  Drinking too much alcohol.  Using illegal drugs, such as cocaine or methamphetamine. What are the signs or symptoms? Symptoms of this condition include:  Chest pain. It may feel like: ? Crushing or squeezing. ? Tightness, pressure, fullness, or heaviness.  Pain in the arm, neck, jaw, back, or upper body.  Shortness of breath.  Heartburn.  Upset stomach  (indigestion).  Feeling like you may vomit (nauseous).  Cold sweats.  Feeling tired.  Sudden light-headedness. How is this treated? A heart attack must be treated as soon as possible. Treatment may include:  Medicines to: ? Break up or dissolve blood clots. ? Thin blood and help prevent blood clots. ? Treat blood pressure. ? Improve blood flow to the heart. ? Reduce pain. ? Reduce cholesterol.  Procedures to widen a blocked artery and keep it open.  Open heart surgery.  Receiving oxygen.  Making your heart strong again (cardiac rehabilitation) through exercise, education, and counseling. Follow these instructions at home: Medicines  Take over-the-counter and prescription medicines only as told by your doctor. You may need to take medicine: ? To keep your blood from clotting too easily. ? To control blood pressure. ? To lower cholesterol. ? To control heart rhythms.  Do not take these medicines unless your doctor says it is okay: ? NSAIDs, such as ibuprofen. ? Supplements that have vitamin A, vitamin E, or both. ? Hormone replacement therapy that has estrogen with or without progestin. Lifestyle      Do not use any products that have nicotine or tobacco, such as cigarettes, e-cigarettes, and chewing tobacco. If you need help quitting, ask your doctor.  Avoid secondhand smoke.  Exercise regularly. Ask your doctor about a cardiac rehab program.  Eat heart-healthy foods. Your doctor will tell you what foods to eat.  Stay at a healthy weight.  Lower your stress level.  Do not use illegal drugs.  Alcohol use  Do not drink alcohol if: ? Your doctor tells you not to drink. ? You are pregnant, may be pregnant, or are planning to become pregnant.  If you drink alcohol: ? Limit how much you use to:  0-1 drink a day for women.  0-2 drinks a day for men. ? Know how much alcohol is in your drink. In the [place] one drink equals one 12 oz bottle of beer (355 mL),  one 5 oz glass of wine (148 mL), or one 1 oz glass of hard liquor (44 mL). General instructions  Work with your doctor to treat other problems you may have, such as diabetes or high blood pressure.  Get screened for depression. Get treatment if needed.  Keep your vaccines up to date. Get the flu shot (influenza vaccine) every year.  Keep all follow-up visits as told by your doctor. This is important. Contact a doctor if:  You feel very sad.  You have trouble doing your daily activities. Get help right away if:  You have sudden, unexplained discomfort in your chest, arms, back, neck, jaw, or upper body.  You have shortness of breath.  You have sudden sweating or clammy skin.  You feel like you may vomit.  You vomit.  You feel tired or weak.  You get light-headed or dizzy.  You feel your heart beating fast.  You feel your heart skipping beats.  You have blood pressure that is higher than 180/120. These symptoms may be an emergency. Do not wait to see if the symptoms will go away. Get medical help right away. Call your local emergency services (911 in the [place] Do not drive yourself to the hospital. Summary  A heart attack occurs when blood and oxygen supply to the heart is cut off.  Do not take NSAIDs unless your doctor says it is okay.  Do not smoke. Avoid secondhand smoke.  Exercise regularly. Ask your doctor about a cardiac rehab program. This information is not intended to replace advice given to you by your health care provider. Make sure you discuss any questions you have with your health care provider. Document Revised: 09/11/2018 Document Reviewed: 09/11/2018 Elsevier Patient Education  [place].

## 2019-09-20 ENCOUNTER — Other Ambulatory Visit: Payer: Medicare HMO

## 2019-09-20 DIAGNOSIS — I25118 Atherosclerotic heart disease of native coronary artery with other forms of angina pectoris: Secondary | ICD-10-CM | POA: Diagnosis not present

## 2019-09-21 LAB — CBC WITH DIFFERENTIAL/PLATELET
Basophils Absolute: 0 10*3/uL (ref 0.0–0.2)
Basos: 1 %
EOS (ABSOLUTE): 0.2 10*3/uL (ref 0.0–0.4)
Eos: 3 %
Hematocrit: 40.4 % (ref 37.5–51.0)
Hemoglobin: 13.8 g/dL (ref 13.0–17.7)
Immature Grans (Abs): 0 10*3/uL (ref 0.0–0.1)
Immature Granulocytes: 1 %
Lymphocytes Absolute: 1.2 10*3/uL (ref 0.7–3.1)
Lymphs: 25 %
MCH: 30.6 pg (ref 26.6–33.0)
MCHC: 34.2 g/dL (ref 31.5–35.7)
MCV: 90 fL (ref 79–97)
Monocytes Absolute: 0.6 10*3/uL (ref 0.1–0.9)
Monocytes: 13 %
Neutrophils Absolute: 2.7 10*3/uL (ref 1.4–7.0)
Neutrophils: 57 %
Platelets: 315 10*3/uL (ref 150–450)
RBC: 4.51 x10E6/uL (ref 4.14–5.80)
RDW: 12.1 % (ref 11.6–15.4)
WBC: 4.7 10*3/uL (ref 3.4–10.8)

## 2019-09-21 LAB — COMPREHENSIVE METABOLIC PANEL
ALT: 15 IU/L (ref 0–44)
AST: 16 IU/L (ref 0–40)
Albumin/Globulin Ratio: 1.7 (ref 1.2–2.2)
Albumin: 4.3 g/dL (ref 3.7–4.7)
Alkaline Phosphatase: 43 IU/L (ref 39–117)
BUN/Creatinine Ratio: 15 (ref 10–24)
BUN: 15 mg/dL (ref 8–27)
Bilirubin Total: 0.3 mg/dL (ref 0.0–1.2)
CO2: 21 mmol/L (ref 20–29)
Calcium: 9.1 mg/dL (ref 8.6–10.2)
Chloride: 101 mmol/L (ref 96–106)
Creatinine, Ser: 1.02 mg/dL (ref 0.76–1.27)
GFR calc Af Amer: 81 mL/min/{1.73_m2} (ref >59)
GFR calc non Af Amer: 70 mL/min/{1.73_m2} (ref >59)
Globulin, Total: 2.5 g/dL (ref 1.5–4.5)
Glucose: 97 mg/dL (ref 65–99)
Potassium: 4.6 mmol/L (ref 3.5–5.2)
Sodium: 135 mmol/L (ref 134–144)
Total Protein: 6.8 g/dL (ref 6.0–8.5)

## 2019-09-21 LAB — LIPID PANEL
Chol/HDL Ratio: 6.2 ratio — ABNORMAL HIGH (ref 0.0–5.0)
Cholesterol, Total: 206 mg/dL — ABNORMAL HIGH (ref 100–199)
HDL: 33 mg/dL — ABNORMAL LOW (ref >39)
LDL Chol Calc (NIH): 148 mg/dL — ABNORMAL HIGH (ref 0–99)
Triglycerides: 135 mg/dL (ref 0–149)
VLDL Cholesterol Cal: 25 mg/dL (ref 5–40)

## 2019-09-21 LAB — CARDIOVASCULAR RISK ASSESSMENT

## 2019-10-15 NOTE — Progress Notes (Signed)
Cardiology Office Note:    Date:  10/16/2019   ID:  Jeffery Turner, DOB November 22, 1940, MRN [id number]  PCP:  Rochel Brome, MD  Cardiologist:  Shirlee More, MD   Referring MD: Rochel Brome, MD  ASSESSMENT:    1. Coronary artery disease of native artery of native heart with stable angina pectoris ([place])   2. Mixed hyperlipidemia   3. Claudication in peripheral vascular disease ([place])    PLAN:    In order of problems listed above:  1. He has known CAD and has had PCI and stent of all 3 coronary vessels over time, his disease process is stable presently New York Heart Association class I continue aspirin beta-blocker and after discussion he agrees to accept lipid-lowering treatment with PCSK9 inhibitor.  We have trouble with precertification we will refer to lipid clinic 1 month after initiation check profile goal LDL in his case is less than 70 may require a second agent like Zetia.  We discussed the utility of a stress test he is confident that he is doing well he is not having symptoms and declines an ischemia evaluation at this time.  I think his decision-making is reasonable.  I do not feel he requires referral for coronary angiography. 2. Poorly controlled severe dyslipidemia will not accept statin initiate PCSK9 inhibitor and may require a second agent like Zetia. 3. He has limiting calf claudication is a former smoker at risk for aortic aneurysm aortic iliac disease will undergo a dedicated duplex and ABIs of the lower extremities.  He may benefit from revascularization and have asked him in the interim to be in a regular walking program.  High-dose statins can also be provided helpful at times increase the maximum walking distance.  Next appointment 6 weeks   Medication Adjustments/Labs and Tests Ordered: Current medicines are reviewed at length with the patient today.  Concerns regarding medicines are outlined above.  Orders Placed This Encounter  Procedures  . Lipid Profile  . EKG  12-Lead  . VAS [place] AAA DUPLEX  . VAS [place] ABI WITH/WO TBI   Meds ordered this encounter  Medications  . Evolocumab (REPATHA SURECLICK) [id number] MG/ML SOAJ    Sig: Inject 1 pen into the skin every 14 (fourteen) days.    Dispense:  2 pen    Refill:  11     Chief Complaint  Patient presents with  . Coronary Artery Disease    Establish care    History of Present Illness:    Jeffery Turner is a 79 y.o. male who is being seen today for the evaluation of CAD at the request of Cox, Kirsten, MD.  He had PCI and drug-eluting stent to the left circumflex coronary artery with acute myocardial infarction and 1997 and subsequently PCI and stent LAD and right coronary artery stents 01/28/20122012.  His most recent lipid profile 3 weeks ago she has a severely elevated LDL 148 non-HDL greater than 180.  In Care Everywhere both in [facility] for recently independently reviewed by me before and during the visit and reviewed with patient.  He is seen here today because he developed typical angina when he got outside of work in his garden chest discomfort radiating to the jaw with shortness of breath relieved with rest.  He was seen by his PCP and placed on metoprolol and has had no recurrence.  He has not needed to use nitroglycerin.  He had been on a statin until a neighbor  had a stroke and diabetes and he will not accept statin therapy.  His biggest problem now is that when he walks he gets weakness and pain in his calves typical claudication although it is occurring less frequently as he walks more he finds his life disruptive and occurs several times a day.  He is not having palpitations syncope orthopnea.  His shortness of breath is resolved with the beta-blocker.  He does not cough or wheeze he is a former smoker.  He also has chronic back pain but has not had surgery.  He has no history of congenital rheumatic heart disease.  Past Medical History:  Diagnosis Date  . Angina  pectoris ([place]) 02/06/2018  . Anginal equivalent ([place]) 09/19/2019  . CAD (coronary artery disease)    s/p stent to CFX 1997 2/2 MI s/p DES to [facility] and DES mLAD 07/14/10 2/2 [place] cath 07/14/10: dLAD 60%; CFX stent ok; OM1 30-40%; pRCA 30%; PDA 40%; EF 60%  . Constant exophthalmos    right  . Coronary artery disease of native artery of native heart with stable angina pectoris ([place]) 07/21/2010   Qualifier: Diagnosis of  By: Denny Peon, CMA, [name]    . Epistaxis 07/21/2010   Qualifier: Diagnosis of  By: Jorene Minors, Scott    . Hyperlipidemia   . MI (myocardial infarction) ([place])   . Old myocardial infarction 07/21/2010   Qualifier: Diagnosis of  By: Hester Mates, Carol    . Osteoarthritis   . OSTEOARTHRITIS 07/21/2010   Qualifier: Diagnosis of  By: Ronne Binning    . Shortness of breath 07/21/2010   Qualifier: Diagnosis of  By: Jorene Minors, Scott    . TOBACCO USE, QUIT 07/21/2010   Qualifier: Diagnosis of  By: Orville Govern CMA, Arbie Cookey    . Transient visual loss, right   . UNSTABLE ANGINA 07/21/2010   Qualifier: Diagnosis of  By: Ronne Binning      Past Surgical History:  Procedure Laterality Date  . CARDIAC CATHETERIZATION      Current Medications: Current Meds  Medication Sig  . aspirin (ASPIRIN 81) 81 MG EC tablet Take 1 tablet (81 mg total) by mouth daily. Swallow whole.  . metoprolol succinate (TOPROL-XL) 25 MG 24 hr tablet Take 1 tablet (25 mg total) by mouth daily.  . Multiple Vitamin (MULTIVITAMIN) capsule Take 1 capsule by mouth daily.    . nitroGLYCERIN (NITROSTAT) 0.4 MG SL tablet Place 1 tablet (0.4 mg total) under the tongue every 5 (five) minutes as needed. If require 2, recommend call 911.     Allergies:   Codeine   Social History   Socioeconomic History  . Marital status: Married    Spouse name: Not on file  . Number of children: Not on file  . Years of education: Not on file  . Highest education level: Not on file  Occupational History  . Not on file  Tobacco Use  . Smoking  status: Former Research scientist (life sciences)  . Smokeless tobacco: Never Used  . Tobacco comment: quit 11 yrs ago  Substance and Sexual Activity  . Alcohol use: No  . Drug use: No  . Sexual activity: Not on file  Other Topics Concern  . Not on file  Social History Narrative   He lives in [place] with family.  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  Social Determinants of Health   Financial Resource Strain:   . Difficulty of Paying Living Expenses:   Food Insecurity:   . Worried About Charity fundraiser in the Last Year:   . Arboriculturist in the Last Year:   Transportation Needs:   . Film/video editor (Medical):   Marland Kitchen Lack of Transportation (Non-Medical):   Physical Activity:   . Days of Exercise per Week:   . Minutes of Exercise per Session:   Stress:   . Feeling of Stress :   Social Connections:   . Frequency of Communication with Friends and Family:   . Frequency of Social Gatherings with Friends and Family:   . Attends Religious Services:   . Active Member of Clubs or Organizations:   . Attends Archivist Meetings:   Marland Kitchen Marital Status:      Family History: The patient's family history includes Alzheimer's disease in his mother; Coronary artery disease in an other family member; Heart failure in his mother.  ROS:   Review of Systems  Constitution: Negative.  HENT: Negative.   Eyes: Negative.   Cardiovascular: Positive for chest pain and dyspnea on exertion.  Respiratory: Positive for shortness of breath.   Endocrine: Negative.   Hematologic/Lymphatic: Negative.   Skin: Negative.   Musculoskeletal: Positive for back pain and muscle cramps.  Gastrointestinal: Negative.   Genitourinary: Negative.   Neurological: Negative.   Psychiatric/Behavioral: Negative.   Allergic/Immunologic: Negative.    Please see the history of present illness.     All other systems  reviewed and are negative.  EKGs/Labs/Other Studies Reviewed:    The following studies were reviewed today:   EKG:  EKG is  ordered today.  The ekg ordered today is personally reviewed and demonstrates sinus rhythm normal EKG  Recent Labs: 09/20/2019: ALT 15; BUN 15; Creatinine, Ser 1.02; Hemoglobin 13.8; Platelets 315; Potassium 4.6; Sodium 135  Recent Lipid Panel    Component Value Date/Time   CHOL 206 (H) 09/20/2019 0854   TRIG 135 09/20/2019 0854   HDL 33 (L) 09/20/2019 0854   CHOLHDL 6.2 (H) 09/20/2019 0854   CHOLHDL 4 04/08/2011 1202   VLDL 17.8 04/08/2011 1202   LDLCALC 148 (H) 09/20/2019 0854   LDLDIRECT 152.5 10/05/2010 0832    Physical Exam:    VS:  BP 92/60   Pulse 72   Ht 5\' 7"  (1.702 m)   Wt 205 lb (93 kg)   BMI 32.11 kg/m     Wt Readings from Last 3 Encounters:  10/16/19 205 lb (93 kg)  09/19/19 208 lb (94.3 kg)  04/08/11 176 lb (79.8 kg)     GEN:  Well nourished, well developed in no acute distress HEENT: Normal NECK: No JVD; No carotid bruits LYMPHATICS: No lymphadenopathy CARDIAC: RRR, no murmurs, rubs, gallops RESPIRATORY:  Clear to auscultation without rales, wheezing or rhonchi  ABDOMEN: Soft, non-tender, non-distended MUSCULOSKELETAL:  No edema; No deformity  SKIN: Warm and dry NEUROLOGIC:  Alert and oriented x 3 PSYCHIATRIC:  Normal affect     Signed, Shirlee More, MD  10/16/2019 11:58 AM    [place]

## 2019-10-16 ENCOUNTER — Ambulatory Visit: Payer: Medicare HMO | Admitting: Cardiology

## 2019-10-16 ENCOUNTER — Other Ambulatory Visit: Payer: Self-pay

## 2019-10-16 VITALS — BP 92/60 | HR 72 | Ht 67.0 in | Wt 205.0 lb

## 2019-10-16 DIAGNOSIS — I739 Peripheral vascular disease, unspecified: Secondary | ICD-10-CM | POA: Diagnosis not present

## 2019-10-16 DIAGNOSIS — I25118 Atherosclerotic heart disease of native coronary artery with other forms of angina pectoris: Secondary | ICD-10-CM

## 2019-10-16 DIAGNOSIS — E782 Mixed hyperlipidemia: Secondary | ICD-10-CM

## 2019-10-16 MED ORDER — REPATHA SURECLICK 140 MG/ML ~~LOC~~ SOAJ: 1.0000 "pen " | SUBCUTANEOUS | 11 refills | Status: DC

## 2019-10-16 NOTE — Patient Instructions (Signed)
Medication Instructions:  Your physician has recommended you make the following change in your medication:  START: Repatha 140mg /ml injection. Inject one pen into the skin every 14 days.  *If you need a refill on your cardiac medications before your next appointment, please call your pharmacy*   Lab Work: Your physician recommends that you return for lab work in: 6 weeks Lipid panel If you have labs (blood work) drawn today and your tests are completely normal, you will receive your results only by: Marland Kitchen MyChart Message (if you have MyChart) OR . A paper copy in the mail If you have any lab test that is abnormal or we need to change your treatment, we will call you to review the results.   Testing/Procedures: We have put in an order for you to have a bilateral lower extremity ABI. We have also put in an order for you to have an abdominal aortic duplex.    Follow-Up: At [facility], you and your health needs are our priority.  As part of our continuing mission to provide you with exceptional heart care, we have created designated Provider Care Teams.  These Care Teams include your primary Cardiologist (physician) and Advanced Practice Providers (APPs -  Physician Assistants and Nurse Practitioners) who all work together to provide you with the care you need, when you need it.  We recommend signing up for the patient portal called "MyChart".  Sign up information is provided on this After Visit Summary.  MyChart is used to connect with patients for Virtual Visits (Telemedicine).  Patients are able to view lab/test results, encounter notes, upcoming appointments, etc.  Non-urgent messages can be sent to your provider as well.   To learn more about what you can do with MyChart, go to [email or web address].    Your next appointment:   6 week(s)  The format for your next appointment:   In Person  Provider:   Shirlee More, MD   Other Instructions

## 2019-11-01 ENCOUNTER — Other Ambulatory Visit: Payer: Medicare HMO

## 2019-11-27 ENCOUNTER — Ambulatory Visit: Payer: Medicare HMO | Admitting: Cardiology

## 2019-12-20 DIAGNOSIS — L820 Inflamed seborrheic keratosis: Secondary | ICD-10-CM | POA: Diagnosis not present

## 2020-01-15 ENCOUNTER — Telehealth: Payer: Self-pay | Admitting: Cardiology

## 2020-01-15 NOTE — Telephone Encounter (Signed)
New Message  Pt c/o of Chest Pain: STAT if CP now or developed within 24 hours  1. Are you having CP right now? Yes  2. Are you experiencing any other symptoms (ex. SOB, nausea, vomiting, sweating)? SOB  3. How long have you been experiencing CP? A while but is getting worse  4. Is your CP continuous or coming and going? Coming and going   5. Have you taken Nitroglycerin? Not sure ?

## 2020-01-15 NOTE — Telephone Encounter (Signed)
Pt states that he has been having intermittent chest tightness radiating into his lower teeth for several weeks. Pt states that this only occurs with activity and is resolved with resting. Pt denies N/V or diaphoresis with the episodes. Pt does state that he has shortness of breath with excerption. He has tried NTG but states resting also resolves the tightness. Pt had a MI approx 20 yrs ago. How do you advise?

## 2020-01-17 ENCOUNTER — Ambulatory Visit: Payer: Medicare HMO | Admitting: Cardiology

## 2020-01-17 ENCOUNTER — Encounter: Payer: Self-pay | Admitting: Cardiology

## 2020-01-17 ENCOUNTER — Other Ambulatory Visit: Payer: Self-pay

## 2020-01-17 VITALS — BP 112/68 | HR 89 | Ht 67.0 in | Wt 210.0 lb

## 2020-01-17 DIAGNOSIS — E782 Mixed hyperlipidemia: Secondary | ICD-10-CM | POA: Diagnosis not present

## 2020-01-17 DIAGNOSIS — I25118 Atherosclerotic heart disease of native coronary artery with other forms of angina pectoris: Secondary | ICD-10-CM

## 2020-01-17 DIAGNOSIS — R0602 Shortness of breath: Secondary | ICD-10-CM

## 2020-01-17 MED ORDER — PRAVASTATIN SODIUM 20 MG PO TABS: 20.0000 mg | ORAL_TABLET | Freq: Every evening | ORAL | 3 refills | Status: DC

## 2020-01-17 NOTE — Patient Instructions (Addendum)
Medication Instructions:  Your physician has recommended you make the following change in your medication:  START: Pravastatin 20 mg take one tablet by mouth daily.  *If you need a refill on your cardiac medications before your next appointment, please call your pharmacy*   Lab Work: Your physician recommends that you return for lab work in: TODAY BMP, ProBNP, CBC If you have labs (blood work) drawn today and your tests are completely normal, you will receive your results only by: Marland Kitchen MyChart Message (if you have MyChart) OR . A paper copy in the mail If you have any lab test that is abnormal or we need to change your treatment, we will call you to review the results.   Testing/Procedures:    [place] [id number] Dept: [phone] Loc: [phone]  BARTLETT ENKE  01/17/2020  You are scheduled for a Cardiac Catheterization on Friday, August 13 with Dr. Glenetta Hew.  1. Please arrive at the [facility] (Main Entrance A) at [facility]: [address] [place], [place] [address] at 8:30 AM (This time is two hours before your procedure to ensure your preparation). Free valet parking service is available.   Special note: Every effort is made to have your procedure done on time. Please understand that emergencies sometimes delay scheduled procedures.  2. Diet: Do not eat solid foods after midnight.  The patient may have clear liquids until 5am upon the day of the procedure.  3. Labs: You will need to have blood drawn on TODAY  4. Medication instructions in preparation for your procedure:   Contrast Allergy: No   On the morning of your procedure, take your Aspirin and any morning medicines NOT listed above.  You may use sips of water.  5. Plan for one night stay--bring personal belongings. 6. Bring a current list of your medications and current insurance cards. 7.  You MUST have a responsible person to drive you home. 8. Someone MUST be with you the first 24 hours after you arrive home or your discharge will be delayed. 9. Please wear clothes that are easy to get on and off and wear slip-on shoes.  Thank you for allowing [place] to care for you!   -- [facility]    Follow-Up: At [facility], you and your health needs are our priority.  As part of our continuing mission to provide you with exceptional heart care, we have created designated Provider Care Teams.  These Care Teams include your primary Cardiologist (physician) and Advanced Practice Providers (APPs -  Physician Assistants and Nurse Practitioners) who all work together to provide you with the care you need, when you need it.  We recommend signing up for the patient portal called "MyChart".  Sign up information is provided on this After Visit Summary.  MyChart is used to connect with patients for Virtual Visits (Telemedicine).  Patients are able to view lab/test results, encounter notes, upcoming appointments, etc.  Non-urgent messages can be sent to your provider as well.   To learn more about what you can do with MyChart, go to [email or web address].    Your next appointment:   4 week(s)  The format for your next appointment:   In Person  Provider:   Shirlee More, MD   Other Instructions

## 2020-01-17 NOTE — Progress Notes (Signed)
Cardiology Office Note:    Date:  01/17/2020   ID:  Jeffery Turner, DOB February 14, 1941, MRN [id number]  PCP:  Rochel Brome, MD  Cardiologist:  Shirlee More, MD    Referring MD: Rochel Brome, MD    ASSESSMENT:    1. Coronary artery disease of native artery of native heart with stable angina pectoris ([place])   2. Mixed hyperlipidemia    PLAN:    In order of problems listed above:  1. He is worsened he has developed severe limiting angina intolerant of multiple medications including beta-blocker in view of his history of multiple PCI's of all native vessels refer for coronary angiography. 2. Poorly controlled I prescribed PCSK9 inhibitor he has not taken it I put him on a low-dose low intensity statin at this time   Next appointment: 1 month   Medication Adjustments/Labs and Tests Ordered: Current medicines are reviewed at length with the patient today.  Concerns regarding medicines are outlined above.  No orders of the defined types were placed in this encounter.  No orders of the defined types were placed in this encounter.   Chief Complaint  Patient presents with   Follow-up    He phoned our office having frequent episodes of exertional chest pain   Coronary Artery Disease    History of Present Illness:    Jeffery Turner is a 79 y.o. male with a hx of CAD having had PCI although all of his native vessels with PCI of the left circumflex 1997 subsequent PCI and stent LAD and right coronary artery January 2012.  Other problems include severe hyperlipidemia.  He was last seen 10/16/2019 and declined an ischemia evaluation.  He was initiated on PCSK9 therapy with his severe dyslipidemia and statin intolerance and for claudication aortoiliac duplex lower extremity ABI segmental pressures were ordered but not performed.Marland Kitchen  He phoned the office with complaints of chest pain and is worked into the schedule today.  Compliance with diet, lifestyle and medications: Yes  This summer he has  had a great deal of difficulty working outdoors when he tries to do garden work or walks faster or incline he has typical angina substernal pressure radiates into his jaw.  He is no longer able to cut the grass without having to stop and he started taking nitroglycerin with relief it occurs almost daily.  He was put on a beta-blocker but he developed fairly severe shortness of breath with it and on exam today he has edema and will get a check a chest x-ray on him.  He has known CAD more interventions of all of his native vessels is developed limiting angina and we referred for coronary angiography and likely further percutaneous intervention.  He was prescribed PCSK9 inhibitor which she has not taken.  Past Medical History:  Diagnosis Date   Angina pectoris ([place]) 02/06/2018   Anginal equivalent ([place]) 09/19/2019   CAD (coronary artery disease)    s/p stent to CFX 1997 2/2 MI s/p DES to [facility] and DES mLAD 07/14/10 2/2 [place] cath 07/14/10: dLAD 60%; CFX stent ok; OM1 30-40%; pRCA 30%; PDA 40%; EF 60%   Constant exophthalmos    right   Coronary artery disease of native artery of native heart with stable angina pectoris ([place]) 07/21/2010   Qualifier: Diagnosis of  By: Denny Peon, CMA, [name]     Epistaxis 07/21/2010   Qualifier: Diagnosis of  By: Jorene Minors, Scott     Hyperlipidemia    MI (myocardial infarction) ([place])  Old myocardial infarction 07/21/2010   Qualifier: Diagnosis of  By: Orville Govern CMA, Carol     Osteoarthritis    OSTEOARTHRITIS 07/21/2010   Qualifier: Diagnosis of  By: Orville Govern CMA, Carol     Shortness of breath 07/21/2010   Qualifier: Diagnosis of  By: Jorene Minors, Scott     TOBACCO USE, QUIT 07/21/2010   Qualifier: Diagnosis of  By: Orville Govern, CMA, Carol     Transient visual loss, right    UNSTABLE ANGINA 07/21/2010   Qualifier: Diagnosis of  By: Ronne Binning      Past Surgical History:  Procedure Laterality Date   CARDIAC CATHETERIZATION      Current Medications: Current Meds    Medication Sig   aspirin (ASPIRIN 81) 81 MG EC tablet Take 1 tablet (81 mg total) by mouth daily. Swallow whole.   Multiple Vitamin (MULTIVITAMIN) capsule Take 1 capsule by mouth daily.     nitroGLYCERIN (NITROSTAT) 0.4 MG SL tablet Place 1 tablet (0.4 mg total) under the tongue every 5 (five) minutes as needed. If require 2, recommend call 911.     Allergies:   Codeine   Social History   Socioeconomic History   Marital status: Married    Spouse name: Not on file   Number of children: Not on file   Years of education: Not on file   Highest education level: Not on file  Occupational History   Not on file  Tobacco Use   Smoking status: Former Smoker   Smokeless tobacco: Never Used   Tobacco comment: quit 11 yrs ago  Substance and Sexual Activity   Alcohol use: No   Drug use: No   Sexual activity: Not on file  Other Topics Concern   Not on file  Social History Narrative   He lives in [place] with family.  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         Social Determinants of Health   Financial Resource Strain:    Difficulty of Paying Living Expenses:   Food Insecurity:    Worried About Charity fundraiser in the Last Year:    Arboriculturist in the Last Year:   Transportation Needs:    Film/video editor (Medical):    Lack of Transportation (Non-Medical):   Physical Activity:    Days of Exercise per Week:    Minutes of Exercise per Session:   Stress:    Feeling of Stress :   Social Connections:    Frequency of Communication with Friends and Family:    Frequency of Social Gatherings with Friends and Family:    Attends Religious Services:    Active Member of Clubs or Organizations:    Attends Archivist Meetings:    Marital Status:      Family History: The patient's family history includes Alzheimer's disease in his  mother; Coronary artery disease in an other family member; Heart failure in his mother. ROS:   Please see the history of present illness.    All other systems reviewed and are negative.  EKGs/Labs/Other Studies Reviewed:    The following studies were reviewed today:  EKG:  EKG ordered today and personally reviewed.  The ekg ordered today demonstrates rhythm normal  Recent Labs: 09/20/2019: ALT 15; BUN 15; Creatinine, Ser 1.02; Hemoglobin 13.8; Platelets 315; Potassium 4.6; Sodium 135  Recent Lipid Panel    Component Value Date/Time   CHOL 206 (H) 09/20/2019 0854   TRIG 135 09/20/2019 0854   HDL 33 (L) 09/20/2019 0854   CHOLHDL 6.2 (H) 09/20/2019 0854   CHOLHDL 4 04/08/2011 1202   VLDL 17.8 04/08/2011 1202   LDLCALC 148 (H) 09/20/2019 0854   LDLDIRECT 152.5 10/05/2010 0832    Physical Exam:    VS:  BP 112/68    Pulse 89    Ht 5\' 7"  (1.702 m)    Wt 210 lb (95.3 kg)    SpO2 94%    BMI 32.89 kg/m     Wt Readings from Last 3 Encounters:  01/17/20 210 lb (95.3 kg)  10/16/19 205 lb (93 kg)  09/19/19 208 lb (94.3 kg)     GEN:  Well nourished, well developed in no acute distress HEENT: Normal NECK: No JVD; No carotid bruits LYMPHATICS: No lymphadenopathy CARDIAC: RRR, no murmurs, rubs, gallops RESPIRATORY:  Clear to auscultation without rales, wheezing or rhonchi  ABDOMEN: Soft, non-tender, non-distended MUSCULOSKELETAL: 1-2+ pitting lower extremity edema; No deformity  SKIN: Warm and dry NEUROLOGIC:  Alert and oriented x 3 PSYCHIATRIC:  Normal affect    Signed, Shirlee More, MD  01/17/2020 3:44 PM    [facility]

## 2020-01-17 NOTE — H&P (View-Only) (Signed)
Cardiology Office Note:    Date:  01/17/2020   ID:  BRAYDON KULLMAN, DOB 1941-04-04, MRN [id number]  PCP:  Rochel Brome, MD  Cardiologist:  Shirlee More, MD    Referring MD: Rochel Brome, MD    ASSESSMENT:    1. Coronary artery disease of native artery of native heart with stable angina pectoris ([place])   2. Mixed hyperlipidemia    PLAN:    In order of problems listed above:  1. He is worsened he has developed severe limiting angina intolerant of multiple medications including beta-blocker in view of his history of multiple PCI's of all native vessels refer for coronary angiography. 2. Poorly controlled I prescribed PCSK9 inhibitor he has not taken it I put him on a low-dose low intensity statin at this time   Next appointment: 1 month   Medication Adjustments/Labs and Tests Ordered: Current medicines are reviewed at length with the patient today.  Concerns regarding medicines are outlined above.  No orders of the defined types were placed in this encounter.  No orders of the defined types were placed in this encounter.   Chief Complaint  Patient presents with  . Follow-up    He phoned our office having frequent episodes of exertional chest pain  . Coronary Artery Disease    History of Present Illness:    Jeffery Turner is a 79 y.o. male with a hx of CAD having had PCI although all of his native vessels with PCI of the left circumflex 1997 subsequent PCI and stent LAD and right coronary artery January 2012.  Other problems include severe hyperlipidemia.  He was last seen 10/16/2019 and declined an ischemia evaluation.  He was initiated on PCSK9 therapy with his severe dyslipidemia and statin intolerance and for claudication aortoiliac duplex lower extremity ABI segmental pressures were ordered but not performed.Marland Kitchen  He phoned the office with complaints of chest pain and is worked into the schedule today.  Compliance with diet, lifestyle and medications: Yes  This summer he has  had a great deal of difficulty working outdoors when he tries to do garden work or walks faster or incline he has typical angina substernal pressure radiates into his jaw.  He is no longer able to cut the grass without having to stop and he started taking nitroglycerin with relief it occurs almost daily.  He was put on a beta-blocker but he developed fairly severe shortness of breath with it and on exam today he has edema and will get a check a chest x-ray on him.  He has known CAD more interventions of all of his native vessels is developed limiting angina and we referred for coronary angiography and likely further percutaneous intervention.  He was prescribed PCSK9 inhibitor which she has not taken.  Past Medical History:  Diagnosis Date  . Angina pectoris ([place]) 02/06/2018  . Anginal equivalent ([place]) 09/19/2019  . CAD (coronary artery disease)    s/p stent to CFX 1997 2/2 MI s/p DES to [facility] and DES mLAD 07/14/10 2/2 [place] cath 07/14/10: dLAD 60%; CFX stent ok; OM1 30-40%; pRCA 30%; PDA 40%; EF 60%  . Constant exophthalmos    right  . Coronary artery disease of native artery of native heart with stable angina pectoris ([place]) 07/21/2010   Qualifier: Diagnosis of  By: Denny Peon, CMA, [name]    . Epistaxis 07/21/2010   Qualifier: Diagnosis of  By: Jorene Minors, Scott    . Hyperlipidemia   . MI (myocardial infarction) ([place])   .  Old myocardial infarction 07/21/2010   Qualifier: Diagnosis of  By: Hester Mates, Carol    . Osteoarthritis   . OSTEOARTHRITIS 07/21/2010   Qualifier: Diagnosis of  By: Ronne Binning    . Shortness of breath 07/21/2010   Qualifier: Diagnosis of  By: Jorene Minors, Scott    . TOBACCO USE, QUIT 07/21/2010   Qualifier: Diagnosis of  By: Orville Govern CMA, Arbie Cookey    . Transient visual loss, right   . UNSTABLE ANGINA 07/21/2010   Qualifier: Diagnosis of  By: Ronne Binning      Past Surgical History:  Procedure Laterality Date  . CARDIAC CATHETERIZATION      Current Medications: Current Meds    Medication Sig  . aspirin (ASPIRIN 81) 81 MG EC tablet Take 1 tablet (81 mg total) by mouth daily. Swallow whole.  . Multiple Vitamin (MULTIVITAMIN) capsule Take 1 capsule by mouth daily.    . nitroGLYCERIN (NITROSTAT) 0.4 MG SL tablet Place 1 tablet (0.4 mg total) under the tongue every 5 (five) minutes as needed. If require 2, recommend call 911.     Allergies:   Codeine   Social History   Socioeconomic History  . Marital status: Married    Spouse name: Not on file  . Number of children: Not on file  . Years of education: Not on file  . Highest education level: Not on file  Occupational History  . Not on file  Tobacco Use  . Smoking status: Former Research scientist (life sciences)  . Smokeless tobacco: Never Used  . Tobacco comment: quit 11 yrs ago  Substance and Sexual Activity  . Alcohol use: No  . Drug use: No  . Sexual activity: Not on file  Other Topics Concern  . Not on file  Social History Narrative   He lives in [place] with family.  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         Social Determinants of Health   Financial Resource Strain:   . Difficulty of Paying Living Expenses:   Food Insecurity:   . Worried About Charity fundraiser in the Last Year:   . Arboriculturist in the Last Year:   Transportation Needs:   . Film/video editor (Medical):   Marland Kitchen Lack of Transportation (Non-Medical):   Physical Activity:   . Days of Exercise per Week:   . Minutes of Exercise per Session:   Stress:   . Feeling of Stress :   Social Connections:   . Frequency of Communication with Friends and Family:   . Frequency of Social Gatherings with Friends and Family:   . Attends Religious Services:   . Active Member of Clubs or Organizations:   . Attends Archivist Meetings:   Marland Kitchen Marital Status:      Family History: The patient's family history includes Alzheimer's disease in his  mother; Coronary artery disease in an other family member; Heart failure in his mother. ROS:   Please see the history of present illness.    All other systems reviewed and are negative.  EKGs/Labs/Other Studies Reviewed:    The following studies were reviewed today:  EKG:  EKG ordered today and personally reviewed.  The ekg ordered today demonstrates rhythm normal  Recent Labs: 09/20/2019: ALT 15; BUN 15; Creatinine, Ser 1.02; Hemoglobin 13.8; Platelets 315; Potassium 4.6; Sodium 135  Recent Lipid Panel    Component Value Date/Time   CHOL 206 (H) 09/20/2019 0854   TRIG 135 09/20/2019 0854   HDL 33 (L) 09/20/2019 0854   CHOLHDL 6.2 (H) 09/20/2019 0854   CHOLHDL 4 04/08/2011 1202   VLDL 17.8 04/08/2011 1202   LDLCALC 148 (H) 09/20/2019 0854   LDLDIRECT 152.5 10/05/2010 0832    Physical Exam:    VS:  BP 112/68   Pulse 89   Ht 5\' 7"  (1.702 m)   Wt 210 lb (95.3 kg)   SpO2 94%   BMI 32.89 kg/m     Wt Readings from Last 3 Encounters:  01/17/20 210 lb (95.3 kg)  10/16/19 205 lb (93 kg)  09/19/19 208 lb (94.3 kg)     GEN:  Well nourished, well developed in no acute distress HEENT: Normal NECK: No JVD; No carotid bruits LYMPHATICS: No lymphadenopathy CARDIAC: RRR, no murmurs, rubs, gallops RESPIRATORY:  Clear to auscultation without rales, wheezing or rhonchi  ABDOMEN: Soft, non-tender, non-distended MUSCULOSKELETAL: 1-2+ pitting lower extremity edema; No deformity  SKIN: Warm and dry NEUROLOGIC:  Alert and oriented x 3 PSYCHIATRIC:  Normal affect    Signed, Shirlee More, MD  01/17/2020 3:44 PM    [facility]

## 2020-01-18 ENCOUNTER — Telehealth: Payer: Self-pay

## 2020-01-18 ENCOUNTER — Encounter: Payer: Self-pay | Admitting: Cardiology

## 2020-01-18 DIAGNOSIS — J9811 Atelectasis: Secondary | ICD-10-CM | POA: Diagnosis not present

## 2020-01-18 DIAGNOSIS — R0602 Shortness of breath: Secondary | ICD-10-CM | POA: Diagnosis not present

## 2020-01-18 DIAGNOSIS — R079 Chest pain, unspecified: Secondary | ICD-10-CM | POA: Diagnosis not present

## 2020-01-18 LAB — BASIC METABOLIC PANEL
BUN/Creatinine Ratio: 19 (ref 10–24)
BUN: 19 mg/dL (ref 8–27)
CO2: 24 mmol/L (ref 20–29)
Calcium: 9.4 mg/dL (ref 8.6–10.2)
Chloride: 103 mmol/L (ref 96–106)
Creatinine, Ser: 1.01 mg/dL (ref 0.76–1.27)
GFR calc Af Amer: 82 mL/min/{1.73_m2} (ref >59)
GFR calc non Af Amer: 71 mL/min/{1.73_m2} (ref >59)
Glucose: 93 mg/dL (ref 65–99)
Potassium: 5 mmol/L (ref 3.5–5.2)
Sodium: 138 mmol/L (ref 134–144)

## 2020-01-18 LAB — CBC
Hematocrit: 38.6 % (ref 37.5–51.0)
Hemoglobin: 12.7 g/dL — ABNORMAL LOW (ref 13.0–17.7)
MCH: 30.5 pg (ref 26.6–33.0)
MCHC: 32.9 g/dL (ref 31.5–35.7)
MCV: 93 fL (ref 79–97)
Platelets: 329 10*3/uL (ref 150–450)
RBC: 4.17 x10E6/uL (ref 4.14–5.80)
RDW: 12.5 % (ref 11.6–15.4)
WBC: 6.2 10*3/uL (ref 3.4–10.8)

## 2020-01-18 LAB — PRO B NATRIURETIC PEPTIDE: NT-Pro BNP: 73 pg/mL (ref 0–486)

## 2020-01-18 NOTE — Telephone Encounter (Signed)
Spoke with patient regarding results and recommendation.  Patient verbalizes understanding and is agreeable to plan of care. Advised patient to call back with any issues or concerns.  

## 2020-01-18 NOTE — Telephone Encounter (Signed)
-----   Message from Berniece Salines, DO sent at 01/18/2020  8:22 AM EDT ----- Hemoglobin slightly decreased 12.7, 4 months ago was 13.8.  For now nothing more to do we will continue to monitor you otherwise all the labs are normal

## 2020-01-23 ENCOUNTER — Other Ambulatory Visit (HOSPITAL_COMMUNITY)
Admission: RE | Admit: 2020-01-23 | Discharge: 2020-01-23 | Disposition: A | Discharge status: Home / Self Care | Payer: Medicare HMO | Source: Ambulatory Visit | Attending: Cardiology | Admitting: Cardiology

## 2020-01-23 DIAGNOSIS — Z20822 Contact with and (suspected) exposure to covid-19: Secondary | ICD-10-CM | POA: Diagnosis not present

## 2020-01-23 DIAGNOSIS — Z01812 Encounter for preprocedural laboratory examination: Secondary | ICD-10-CM | POA: Diagnosis not present

## 2020-01-23 LAB — SARS CORONAVIRUS 2 (TAT 6-24 HRS): SARS Coronavirus 2: NEGATIVE

## 2020-01-24 ENCOUNTER — Telehealth: Payer: Self-pay | Admitting: *Deleted

## 2020-01-24 NOTE — Telephone Encounter (Signed)
Pt contacted pre-catheterization scheduled at [facility] for: Friday January 25, 2020 10:30 AM Verified arrival time and place: [facility]) at: 8:30 AM   No solid food after midnight prior to cath, clear liquids until 5 AM day of procedure.   AM meds can be  taken pre-cath with sips of water including: ASA 81 mg   Confirmed patient has responsible adult to drive home post procedure and observe 24 hours after arriving home: yes  You are allowed ONE visitor in the waiting room during your procedure. Both you and your visitor must wear a mask once you enter the hospital.      COVID-19 Pre-Screening Questions:  . In the past 14 days have you had a new cough associated with shortness of breath, unexplained body aches, fever (100.4 or greater) or sudden loss of taste or sense of smell? no . In the past 14 days have you been around anyone with known Covid 19? no . Have you been vaccinated for COVID-19? No  Reviewed procedure/mask/visitor instructions, COVID-19 questions with patient

## 2020-01-25 ENCOUNTER — Encounter (HOSPITAL_COMMUNITY)
Admission: RE | Disposition: A | Discharge status: Home / Self Care | Payer: Self-pay | Source: Home / Self Care | Attending: Cardiology

## 2020-01-25 ENCOUNTER — Ambulatory Visit (HOSPITAL_COMMUNITY)
Admission: RE | Admit: 2020-01-25 | Discharge: 2020-01-25 | Disposition: A | Discharge status: Home / Self Care | Payer: Medicare HMO | Attending: Cardiology | Admitting: Cardiology

## 2020-01-25 ENCOUNTER — Other Ambulatory Visit: Payer: Self-pay

## 2020-01-25 DIAGNOSIS — I209 Angina pectoris, unspecified: Secondary | ICD-10-CM

## 2020-01-25 DIAGNOSIS — Z955 Presence of coronary angioplasty implant and graft: Secondary | ICD-10-CM | POA: Insufficient documentation

## 2020-01-25 DIAGNOSIS — I25118 Atherosclerotic heart disease of native coronary artery with other forms of angina pectoris: Secondary | ICD-10-CM | POA: Diagnosis present

## 2020-01-25 DIAGNOSIS — Z87891 Personal history of nicotine dependence: Secondary | ICD-10-CM | POA: Diagnosis not present

## 2020-01-25 DIAGNOSIS — Z885 Allergy status to narcotic agent status: Secondary | ICD-10-CM | POA: Insufficient documentation

## 2020-01-25 DIAGNOSIS — I252 Old myocardial infarction: Secondary | ICD-10-CM | POA: Diagnosis not present

## 2020-01-25 DIAGNOSIS — E782 Mixed hyperlipidemia: Secondary | ICD-10-CM | POA: Diagnosis not present

## 2020-01-25 DIAGNOSIS — I25119 Atherosclerotic heart disease of native coronary artery with unspecified angina pectoris: Secondary | ICD-10-CM | POA: Diagnosis not present

## 2020-01-25 DIAGNOSIS — Z7982 Long term (current) use of aspirin: Secondary | ICD-10-CM | POA: Diagnosis not present

## 2020-01-25 DIAGNOSIS — I208 Other forms of angina pectoris: Secondary | ICD-10-CM | POA: Diagnosis present

## 2020-01-25 HISTORY — PX: LEFT HEART CATH AND CORONARY ANGIOGRAPHY: CATH118249

## 2020-01-25 SURGERY — LEFT HEART CATH AND CORONARY ANGIOGRAPHY
Anesthesia: LOCAL

## 2020-01-25 MED ORDER — SODIUM CHLORIDE 0.9% FLUSH: 3.0000 mL | INTRAVENOUS | Status: DC | PRN

## 2020-01-25 MED ORDER — ISOSORBIDE MONONITRATE ER 30 MG PO TB24
30.0000 mg | ORAL_TABLET | Freq: Every day | ORAL | 3 refills | Status: DC
Start: 2020-01-25 — End: 2020-02-12

## 2020-01-25 MED ORDER — FENTANYL CITRATE (PF) 100 MCG/2ML IJ SOLN
INTRAMUSCULAR | Status: AC
  Filled 2020-01-25: qty 2

## 2020-01-25 MED ORDER — SODIUM CHLORIDE 0.9 % IV SOLN: INTRAVENOUS | Status: DC

## 2020-01-25 MED ORDER — ACETAMINOPHEN 325 MG PO TABS: 650.0000 mg | ORAL_TABLET | ORAL | Status: DC | PRN

## 2020-01-25 MED ORDER — ASPIRIN 81 MG PO CHEW
81.0000 mg | CHEWABLE_TABLET | ORAL | Status: AC
  Administered 2020-01-25: 81 mg via ORAL
  Filled 2020-01-25: qty 1

## 2020-01-25 MED ORDER — SODIUM CHLORIDE 0.9 % WEIGHT BASED INFUSION: 1.0000 mL/kg/h | INTRAVENOUS | Status: DC

## 2020-01-25 MED ORDER — HEPARIN SODIUM (PORCINE) 1000 UNIT/ML IJ SOLN
INTRAMUSCULAR | Status: DC | PRN
  Administered 2020-01-25: 5000 [IU] via INTRAVENOUS

## 2020-01-25 MED ORDER — LABETALOL HCL 5 MG/ML IV SOLN: 10.0000 mg | INTRAVENOUS | Status: DC | PRN

## 2020-01-25 MED ORDER — HYDRALAZINE HCL 20 MG/ML IJ SOLN: 10.0000 mg | INTRAMUSCULAR | Status: DC | PRN

## 2020-01-25 MED ORDER — IOHEXOL 350 MG/ML SOLN
INTRAVENOUS | Status: DC | PRN
  Administered 2020-01-25: 75 mL

## 2020-01-25 MED ORDER — SODIUM CHLORIDE 0.9 % WEIGHT BASED INFUSION
3.0000 mL/kg/h | INTRAVENOUS | Status: AC
  Administered 2020-01-25: 3 mL/kg/h via INTRAVENOUS

## 2020-01-25 MED ORDER — HEPARIN (PORCINE) IN NACL 1000-0.9 UT/500ML-% IV SOLN
INTRAVENOUS | Status: AC
  Filled 2020-01-25: qty 1000

## 2020-01-25 MED ORDER — MIDAZOLAM HCL 2 MG/2ML IJ SOLN
INTRAMUSCULAR | Status: DC | PRN
  Administered 2020-01-25: 1 mg via INTRAVENOUS

## 2020-01-25 MED ORDER — HEPARIN SODIUM (PORCINE) 1000 UNIT/ML IJ SOLN
INTRAMUSCULAR | Status: AC
  Filled 2020-01-25: qty 1

## 2020-01-25 MED ORDER — BISOPROLOL FUMARATE 5 MG PO TABS: 2.5000 mg | ORAL_TABLET | Freq: Every day | ORAL | 3 refills | Status: DC

## 2020-01-25 MED ORDER — LIDOCAINE HCL (PF) 1 % IJ SOLN
INTRAMUSCULAR | Status: DC | PRN
  Administered 2020-01-25: 2 mL

## 2020-01-25 MED ORDER — VERAPAMIL HCL 2.5 MG/ML IV SOLN
INTRAVENOUS | Status: DC | PRN
  Administered 2020-01-25: 10 mL via INTRA_ARTERIAL

## 2020-01-25 MED ORDER — SODIUM CHLORIDE 0.9% FLUSH: 3.0000 mL | Freq: Two times a day (BID) | INTRAVENOUS | Status: DC

## 2020-01-25 MED ORDER — VERAPAMIL HCL 2.5 MG/ML IV SOLN
INTRAVENOUS | Status: AC
  Filled 2020-01-25: qty 2

## 2020-01-25 MED ORDER — SODIUM CHLORIDE 0.9 % IV SOLN: 250.0000 mL | INTRAVENOUS | Status: DC | PRN

## 2020-01-25 MED ORDER — MIDAZOLAM HCL 2 MG/2ML IJ SOLN
INTRAMUSCULAR | Status: AC
  Filled 2020-01-25: qty 2

## 2020-01-25 MED ORDER — ONDANSETRON HCL 4 MG/2ML IJ SOLN: 4.0000 mg | Freq: Four times a day (QID) | INTRAMUSCULAR | Status: DC | PRN

## 2020-01-25 MED ORDER — HEPARIN (PORCINE) IN NACL 1000-0.9 UT/500ML-% IV SOLN
INTRAVENOUS | Status: DC | PRN
  Administered 2020-01-25 (×2): 500 mL

## 2020-01-25 MED ORDER — LIDOCAINE HCL (PF) 1 % IJ SOLN
INTRAMUSCULAR | Status: AC
  Filled 2020-01-25: qty 30

## 2020-01-25 SURGICAL SUPPLY — 11 items
CATH INFINITI 5FR ANG PIGTAIL (CATHETERS) ×1 IMPLANT
CATH OPTITORQUE TIG 4.0 5F (CATHETERS) ×1 IMPLANT
DEVICE RAD COMP TR BAND LRG (VASCULAR PRODUCTS) ×1 IMPLANT
GLIDESHEATH SLEND SS 6F .021 (SHEATH) ×1 IMPLANT
GUIDEWIRE INQWIRE 1.5J.035X260 (WIRE) IMPLANT
INQWIRE 1.5J .035X260CM (WIRE) ×2
KIT HEART LEFT (KITS) ×2 IMPLANT
PACK CARDIAC CATHETERIZATION (CUSTOM PROCEDURE TRAY) ×2 IMPLANT
SHEATH PROBE COVER 6X72 (BAG) ×1 IMPLANT
TRANSDUCER W/STOPCOCK (MISCELLANEOUS) ×2 IMPLANT
TUBING CIL FLEX 10 FLL-RA (TUBING) ×2 IMPLANT

## 2020-01-25 NOTE — Interval H&P Note (Signed)
History and Physical Interval Note:  01/25/2020 9:10 AM  Jeffery Turner  has presented today for surgery, with the diagnosis of CAD with Angina. Class III  The various methods of treatment have been discussed with the patient and family. After consideration of risks, benefits and other options for treatment, the patient has consented to  Procedure(s): LEFT HEART CATH AND CORONARY ANGIOGRAPHY (N/A)  PERCUTANEOUS CORONARY INTERVENTION  as a surgical intervention.  The patient's history has been reviewed, patient examined, no change in status, stable for surgery.  I have reviewed the patient's chart and labs.  Questions were answered to the patient's satisfaction.    Cath Lab Visit (complete for each Cath Lab visit)  Clinical Evaluation Leading to the Procedure:   ACS: No.  Non-ACS:    Anginal Classification: CCS III  Anti-ischemic medical therapy: Minimal Therapy (1 class of medications)  Non-Invasive Test Results: No non-invasive testing performed  Prior CABG: No previous CABG  Glenetta Hew

## 2020-01-25 NOTE — Discharge Instructions (Signed)
Drink plenty of fluids for 48 hours and keep wrist elevated at heart level for 24 hours  Radial Site Care   This sheet gives you information about how to care for yourself after your procedure. Your health care provider may also give you more specific instructions. If you have problems or questions, contact your health care provider. What can I expect after the procedure? After the procedure, it is common to have:  Bruising and tenderness at the catheter insertion area. Follow these instructions at home: Medicines  Take over-the-counter and prescription medicines only as told by your health care provider. Insertion site care 1. Follow instructions from your health care provider about how to take care of your insertion site. Make sure you: ? Wash your hands with soap and water before you change your bandage (dressing). If soap and water are not available, use hand sanitizer. ? Remove your dressing as told by your health care provider. In 24 hours 2. Check your insertion site every day for signs of infection. Check for: ? Redness, swelling, or pain. ? Fluid or blood. ? Pus or a bad smell. ? Warmth. 3. Do not take baths, swim, or use a hot tub until your health care provider approves. 4. You may shower 24-48 hours after the procedure, or as directed by your health care provider. ? Remove the dressing and gently wash the site with plain soap and water. ? Pat the area dry with a clean towel. ? Do not rub the site. That could cause bleeding. 5. Do not apply powder or lotion to the site. Activity   1. For 24 hours after the procedure, or as directed by your health care provider: ? Do not flex or bend the affected arm. ? Do not push or pull heavy objects with the affected arm. ? Do not drive yourself home from the hospital or clinic. You may drive 24 hours after the procedure unless your health care provider tells you not to. ? Do not operate machinery or power tools. 2. Do not lift  anything that is heavier than 10 lb (4.5 kg), or the limit that you are told, until your health care provider says that it is safe.  For 4 days 3. Ask your health care provider when it is okay to: ? Return to work or school. ? Resume usual physical activities or sports. ? Resume sexual activity. General instructions  If the catheter site starts to bleed, raise your arm and put firm pressure on the site. If the bleeding does not stop, get help right away. This is a medical emergency.  If you went home on the same day as your procedure, a responsible adult should be with you for the first 24 hours after you arrive home.  Keep all follow-up visits as told by your health care provider. This is important. Contact a health care provider if:  You have a fever.  You have redness, swelling, or yellow drainage around your insertion site. Get help right away if:  You have unusual pain at the radial site.  The catheter insertion area swells very fast.  The insertion area is bleeding, and the bleeding does not stop when you hold steady pressure on the area.  Your arm or hand becomes pale, cool, tingly, or numb. These symptoms may represent a serious problem that is an emergency. Do not wait to see if the symptoms will go away. Get medical help right away. Call your local emergency services (911 in the [place] Do   not drive yourself to the hospital. Summary  After the procedure, it is common to have bruising and tenderness at the site.  Follow instructions from your health care provider about how to take care of your radial site wound. Check the wound every day for signs of infection.  Do not lift anything that is heavier than 10 lb (4.5 kg), or the limit that you are told, until your health care provider says that it is safe. This information is not intended to replace advice given to you by your health care provider. Make sure you discuss any questions you have with your health care  provider. Document Revised: 07/06/2017 Document Reviewed: 07/06/2017 Elsevier Patient Education  2020 [employer]  

## 2020-01-25 NOTE — Progress Notes (Signed)
[facility] consulted for outpatient CABG evaluation. [facility] office will call pt with an appointment. 

## 2020-01-28 ENCOUNTER — Encounter (HOSPITAL_COMMUNITY): Payer: Self-pay | Admitting: Cardiology

## 2020-01-28 ENCOUNTER — Encounter: Payer: Medicare HMO | Admitting: Cardiothoracic Surgery

## 2020-01-29 ENCOUNTER — Institutional Professional Consult (permissible substitution): Payer: Medicare HMO | Admitting: Cardiothoracic Surgery

## 2020-01-29 ENCOUNTER — Other Ambulatory Visit: Payer: Self-pay | Admitting: *Deleted

## 2020-01-29 ENCOUNTER — Other Ambulatory Visit: Payer: Self-pay

## 2020-01-29 VITALS — BP 134/72 | HR 63 | Temp 97.8°F | Resp 20 | Ht 67.0 in | Wt 208.0 lb

## 2020-01-29 DIAGNOSIS — I2510 Atherosclerotic heart disease of native coronary artery without angina pectoris: Secondary | ICD-10-CM

## 2020-01-29 NOTE — Patient Instructions (Signed)
Coronary Artery Bypass Grafting  Coronary artery bypass grafting (CABG) is a surgery to bypassor to fix arteries of the heart (coronary arteries) that are narrow or blocked. This narrowing is usually the result of a buildup of fatty deposits (plaques) in the walls of the vessels. The coronary arteries supply the heart with the oxygen and nutrients that it needs to pump blood through your body. In this surgery, a section of blood vessel from another part of the body (usually the chest, arm, or leg) is removed and then placed where it will allow blood to flow around the damaged part of the coronary artery. The new section of blood vessel is called the graft. Tell a health care provider about:  Any allergies you have.  All medicines you are taking or using, including steroids, blood thinners, vitamins, herbs, eye drops, creams, and over-the-counter medicines.  Any problems you or family members have had with anesthetic medicines.  Any blood disorders you have.  Any surgeries you have had.  Any medical conditions you have.  Whether you are pregnant or may be pregnant. What are the risks? Generally, this is a safe procedure. However, problems may occur, including:  Bleeding, which may require transfusions.  Infection.  Allergic reactions to medicines or dyes.  Pain at the surgical site.  Damage to other structures or organs.  Short-term memory loss, confusion, and personality changes.  Heart rhythm problems (arrhythmias).  Stroke.  Heart attack during or after surgery.  Kidney failure. What happens before the procedure? Staying hydrated Follow instructions from your health care provider about hydration, which may include:  Up to 2 hours before the procedure - you may continue to drink clear liquids, such as water, clear fruit juice, black coffee, and plain tea.  Eating and drinking Follow instructions from your health care provider about eating and drinking, which may  include:  8 hours before the procedure - stop eating heavy meals or foods, such as meat, fried foods, or fatty foods.  6 hours before the procedure - stop eating light meals or foods, such as toast or cereal.  6 hours before the procedure - stop drinking milk or drinks that contain milk.  2 hours before the procedure - stop drinking clear liquids. Medicines  Take over-the-counter and prescription medicines only as told by your health care provider.  Ask your health care provider about: ? Changing or stopping your regular medicines. This is especially important if you are taking diabetes medicines or blood thinners. You may be asked to start new medicines and stop taking others. Do not stop medicines or adjust dosages on your own. ? Taking medicines such as aspirin and ibuprofen. These medicines can thin your blood. Do not take these medicines unless your health care provider tells you to take them. ? Taking over-the-counter medicines, vitamins, herbs, and supplements. General instructions  Ask your health care provider: ? How your surgical site will be marked or identified. ? What steps will be taken to help prevent infection. These may include:  Removing hair at the surgery site.  Washing skin with a germ-killing soap.  Taking antibiotic medicine.  You may be asked to shower with a germ-killing soap.  For 3-6 weeks before the procedure, do not use any products that contain nicotine or tobacco, such as cigarettes, e-cigarettes, and chewing tobacco. Quitting smoking is one of the best things you can do for your heart health. If you need help quitting, ask your health care provider.  Talk with your health  care provider about where the grafts will be taken from for your surgery. What happens during the procedure?  An IV will be inserted into one of your veins.  You will be given one or more of the following: ? A medicine to help you relax (sedative). ? A medicine to make you fall  asleep (general anesthetic).  An incision will be made down the front of the chest through the breastbone (sternum). The sternum will be opened so the surgeon can see the heart.  You may or may not be placed on a heart-lung bypass machine. If this machine is used, your heart will be temporarily stopped. The machine will provide oxygen to your blood while the surgeon works on your heart. If the machine is not used, it is called beating heart bypass surgery.  A section of blood vessel will be removed from another part of your body (usually the chest, arm, or leg).  The blood vessel will be attached above and below the blocked artery of your heart. This may be done on more than one artery of the heart.  When the bypass is done, you will be taken off the heart-lung machine if it was used.  If your heart was stopped, it will be restarted and will take over again normally.  Your chest will be closed with special surgical wire to hold your bones together for healing.  Your incision(s) will be closed with stitches (sutures), skin glue, or adhesive strips.  Bandages (dressings) will be placed over the incision(s).  Tubes will remain in your chest and will be connected to a suction device to help drain fluid and reinflate the lungs. The procedure may vary among health care providers and hospitals. What happens after the procedure?  Your blood pressure, heart rate, breathing rate, and blood oxygen level will be monitored until you leave the hospital.  You may wake up with a tube in your throat to help your breathing. You may be connected to a breathing machine. You will not be able to talk while the tube is in place. The tube will be taken out as soon as it is safe.  You will be groggy and may have some pain. You will be given pain medicine to help control the pain.  You may be in the intensive care unit for 1-2 days.  You may be given oxygen to help you breathe.  You will be shown how to do  deep breathing exercises.  You may have to wear compression stockings. These stockings help to prevent blood clots and reduce swelling in your legs.  You may be given new medicines to take after your surgery.  Cardiac rehabilitation will be started while you are in the hospital. This may include education and exercises to help you recover from your surgery. Summary  In this procedure, a section of blood vessel from another part of the body (usually the chest, arm, or leg) is removed and then placed where it will allow blood to bypass the narrowed or blocked part of the coronary artery.  For 3-6 weeks before the procedure, do not use any products that contain nicotine or tobacco, such as cigarettes, e-cigarettes, and chewing tobacco. If you need help quitting, ask your health care provider.  You may or may not be placed on a heart-lung bypass machine during the surgery. If used, this machine will provide oxygen to your blood while the surgeon works on your heart.  You may wake up with a tube in  your throat to help your breathing. You may be connected to a breathing machine. The tube will be taken out as soon as it is safe. This information is not intended to replace advice given to you by your health care provider. Make sure you discuss any questions you have with your health care provider. Document Revised: 02/07/2018 Document Reviewed: 02/07/2018 Elsevier Patient Education  [employer].   Coronary Artery Bypass Grafting, Care After This sheet gives you information about how to care for yourself after your procedure. Your health care provider may also give you more specific instructions. If you have problems or questions, contact your health care provider. What can I expect after the procedure? After the procedure, it is common to have:  Nausea.  Lack of appetite.  Constipation.  Weakness and fatigue.  Depression or irritability.  Pain or discomfort in your incision  areas. Follow these instructions at home: Medicines  Take over-the-counter and prescription medicines only as told by your health care provider. Do not stop taking medicines or start any new medicines without approval from your health care provider.  If you were prescribed an antibiotic medicine, take it as told by your health care provider. Do not stop using the antibiotic even if you start to feel better. Incision care   Follow instructions from your health care provider about how to take care of your incisions. Make sure you: ? Wash your hands with soap and water before and after you change your bandage (dressing). If soap and water are not available, use hand sanitizer. ? Change your dressing as told by your health care provider. ? Leave stitches (sutures), skin glue, or adhesive strips in place. These skin closures may need to stay in place for 2 weeks or longer. If adhesive strip edges start to loosen and curl up, you may trim the loose edges. Do not remove adhesive strips completely unless your health care provider tells you to do that.  Keep incision areas clean, dry, and protected.  Check your incision areas every day for signs of infection. Check for: ? More redness, swelling, or pain. ? More fluid or blood. ? Warmth. ? Pus or a bad smell.  If incisions were made in your legs: ? Avoid crossing your legs. ? Avoid sitting for long periods of time. Change positions every 30 minutes. ? Raise (elevate) your legs when you are sitting. Bathing  Do not take baths, swim, or use a hot tub until your health care provider approves.  Only take sponge baths. Pat the incisions dry. Do not rub incisions with a washcloth or towel.  Ask your health care provider when you can shower. Eating and drinking   Eat foods that are high in fiber, such as beans, nuts, whole grains, and raw fruits and vegetables. Meats, if eaten, should be lean cut. Avoid canned, processed, and fried foods. This  can help prevent constipation and is a recommended part of a heart-healthy diet.  Drink enough fluid to keep your urine pale yellow.  Do not drink alcohol until your recovery is complete. Ask your health care provider when it is safe to drink alcohol. Activity  Rest and limit your activity as told by your health care provider. You may be instructed to: ? Stop any activity right away if you have chest pain, shortness of breath, irregular heartbeats, or dizziness. Get help right away if you have any of these symptoms. ? Move around frequently for short periods or take short walks as told  by your health care provider. Gradually increase your activities. ? Avoid lifting, pushing, or pulling anything that is heavier than 10 lb (4.5 kg) for at least 6 weeks or as told by your health care provider.  Participate in physical therapy or a cardiac rehabilitation program as told by your health care provider. ? Physical therapy involves doing exercises to maintain movement, strengthen your muscles, and build your endurance. ? A cardiac rehabilitation program is a treatment program to improve your health and well-being through exercise training, education, and counseling.  Do not drive until your health care provider approves.  Ask your health care provider when you may return to work.  Ask your health care provider when you may resume sexual activity. General instructions  Do not drive or use heavy machinery while taking prescription pain medicine.  Do not use any products that contain nicotine or tobacco, such as cigarettes, e-cigarettes, and chewing tobacco. If you need help quitting, ask your health care provider.  Take 2-3 deep breaths every few hours during the day, while you recover. This helps expand your lungs and prevent complications like pneumonia after surgery.  If you were given a device called an incentive spirometer, use it several times a day to practice deep breathing. Support your  chest with a pillow or your arms when you take deep breaths or cough.  Wear compression stockings as told by your health care provider. These stockings help to prevent blood clots and reduce swelling in your legs.  Weigh yourself every day. This helps identify if your body is holding (retaining) fluid that may make your heart and lungs work harder.  Keep all follow-up visits as told by your health care provider. This is important. Contact a health care provider if:  You have more redness, swelling, or pain around any incision.  You have more fluid or blood coming from any incision.  Any incision feels warm to the touch.  You have pus or a bad smell coming from any incision.  You have a fever.  You have swelling in your ankles or legs.  You have pain in your legs.  You gain 2 lb (0.9 kg) or more a day.  You are nauseous or you vomit.  You have diarrhea. Get help right away if:  You have chest pain that spreads to your jaw or arms.  You are short of breath.  You have a fast or irregular heartbeat.  You notice a "clicking" in your breastbone (sternum) when you move.  You have any symptoms of a stroke. "BE FAST" is an easy way to remember the main warning signs of a stroke: ? B - Balance. Signs are dizziness, sudden trouble walking, or loss of balance. ? E - Eyes. Signs are trouble seeing or a sudden change in vision. ? F - Face. Signs are sudden weakness or numbness of the face, or the face or eyelid drooping on one side. ? A - Arms. Signs are weakness or numbness in an arm. This happens suddenly and usually on one side of the body. ? S - Speech. Signs are sudden trouble speaking, slurred speech, or trouble understanding what people say. ? T - Time. Time to call emergency services. Write down what time symptoms started.  You have other signs of a stroke, such as: ? A sudden, severe headache with no known cause. ? Nausea or vomiting. ? Seizure. These symptoms may  represent a serious problem that is an emergency. Do not wait to see if  the symptoms will go away. Get medical help right away. Call your local emergency services (911 in the [place] Do not drive yourself to the hospital. Summary  After the procedure, it is common to have pain or discomfort in the incision areas.  Do not take baths, swim, or use a hot tub until your health care provider approves.  Gradually increase your activities. You may need physical therapy or cardiac rehabilitation to help strengthen your muscles and build your endurance.  Weigh yourself every day. This helps identify if your body is holding (retaining) fluid that may make your heart and lungs work harder. This information is not intended to replace advice given to you by your health care provider. Make sure you discuss any questions you have with your health care provider. Document Revised: 02/07/2018 Document Reviewed: 02/07/2018 Elsevier Patient Education  2020 [employer].

## 2020-01-29 NOTE — Progress Notes (Signed)
[address]       ,[place] [address]             [phone]                    Jeffery Turner  Medical Record #[id number] Date of Birth: 1941/06/04  Referring: Leonie Man, MD Primary Care: Rochel Brome, MD Primary Cardiologist: Shirlee More, MD  Chief Complaint:    Chief Complaint  Patient presents with  . Coronary Artery Disease    surgical consult, Cardiac Cath 01/25/20    History of Present Illness:    Jeffery Turner 79 y.o. male is seen in the office  today at the request of Dr. Ellyn Hack to consider coronary artery bypass grafting.  The patient has known history of coronary occlusive disease dating back to 69.  At that time he presented with myocardial infarction and underwent stenting of the circumflex coronary artery. he had recurrent anginal symptoms been 2012 and stenting of the LAD and right coronary was done at that time.  The patient notes that over the past several months he has had increasing symptoms similar to previous cardiac symptoms.  Mostly chest tightness with shortness of breath with exertion, he denies rest symptoms.  Because of the repeat symptoms he underwent recent cardiac catheterization   When seen by cardiology he was started started on beta-blocker, but developed increased shortness of breath with this.  He has been prescribed a PCSK9 inhibitor but has not started on  He notes he uses "colloidal silver mist" to prevent developing Covid-the patient has not had a Covid vaccination though his wife notes that she has  Patient denies diabetes denies previous stroke, is a previous smoker quit in December 2001.  Current Activity/ Functional Status:  Patient is independent with mobility/ambulation, transfers, ADL's, IADL's.   Zubrod Score: At the time of surgery this patient's most appropriate activity status/level should be described as: []     0    Normal activity, no symptoms [x]     1    Restricted in physical  strenuous activity but ambulatory, able to do out light work []     2    Ambulatory and capable of self care, unable to do work activities, up and about               >50 % of waking hours                              []     3    Only limited self care, in bed greater than 50% of waking hours []     4    Completely disabled, no self care, confined to bed or chair []     5    Moribund   Past Medical History:  Diagnosis Date  . Angina pectoris ([place]) 02/06/2018  . Anginal equivalent ([place]) 09/19/2019  . CAD (coronary artery disease)    s/p stent to CFX 1997 2/2 MI s/p DES to [facility] and DES mLAD 07/14/10 2/2 [place] cath 07/14/10: dLAD 60%; CFX stent ok; OM1 30-40%; pRCA 30%; PDA 40%; EF 60%  . Constant exophthalmos    right  . Coronary artery disease of native artery of native heart with stable angina pectoris ([place]) 07/21/2010   Qualifier: Diagnosis of  By: Denny Peon, CMA, [name]    . Epistaxis 07/21/2010   Qualifier: Diagnosis of  By:  Jorene Minors, Event organiser    . Hyperlipidemia   . MI (myocardial infarction) ([place])   . Old myocardial infarction 07/21/2010   Qualifier: Diagnosis of  By: Hester Mates, Carol    . Osteoarthritis   . OSTEOARTHRITIS 07/21/2010   Qualifier: Diagnosis of  By: Ronne Binning    . Shortness of breath 07/21/2010   Qualifier: Diagnosis of  By: Jorene Minors, Scott    . TOBACCO USE, QUIT 07/21/2010   Qualifier: Diagnosis of  By: Orville Govern CMA, Arbie Cookey    . Transient visual loss, right   . UNSTABLE ANGINA 07/21/2010   Qualifier: Diagnosis of  By: Ronne Binning      Past Surgical History:  Procedure Laterality Date  . CARDIAC CATHETERIZATION    . LEFT HEART CATH AND CORONARY ANGIOGRAPHY N/A 01/25/2020   Procedure: LEFT HEART CATH AND CORONARY ANGIOGRAPHY;  Surgeon: Leonie Man, MD;  Location: [facility];  Service: Cardiovascular;  Laterality: N/A;    Family History  Problem Relation Age of Onset  . Coronary artery disease Other   . Heart failure Mother   . Alzheimer's disease  Mother      Social History   Tobacco Use  Smoking Status Former Smoker  Smokeless Tobacco Never Used  Tobacco Comment   quit 11 yrs ago    Social History   Substance and Sexual Activity  Alcohol Use No     Allergies  Allergen Reactions  . Codeine Nausea And Vomiting    Current Outpatient Medications  Medication Sig Dispense Refill  . aspirin (ASPIRIN 81) 81 MG EC tablet Take 1 tablet (81 mg total) by mouth daily. Swallow whole. 30 tablet 12  . bisoprolol (ZEBETA) 5 MG tablet Take 0.5 tablets (2.5 mg total) by mouth daily. 45 tablet 3  . isosorbide mononitrate (IMDUR) 30 MG 24 hr tablet Take 1 tablet (30 mg total) by mouth daily. Take 30 min after ASA 81 mg.  (best taken at night) 30 tablet 3  . Multiple Vitamin (MULTIVITAMIN) capsule Take 1 capsule by mouth daily.      Marland Kitchen NATTOKINASE PO Take 1 capsule by mouth daily.    . nitroGLYCERIN (NITROSTAT) 0.4 MG SL tablet Place 1 tablet (0.4 mg total) under the tongue every 5 (five) minutes as needed. If require 2, recommend call 911. 50 tablet 0  . pravastatin (PRAVACHOL) 20 MG tablet Take 1 tablet (20 mg total) by mouth every evening. 90 tablet 3  . TURMERIC PO Take 1 capsule by mouth 3 (three) times daily as needed (inflammation).     No current facility-administered medications for this visit.    Pertinent items are noted in HPI.   Review of Systems:     Cardiac Review of Systems: [Y] = yes  or   [ N ] = no   Chest Pain [ y   ]  Resting SOB [  n ] Exertional SOB  Blue.Reese  ]  Orthopnea [  ]   Pedal Edema [  n ]    Palpitations [n  ] Syncope  [ n ]   Presyncope [ n  ]   General Review of Systems: [Y] = yes [  ]=no Constitional: recent weight change [  ];  Wt loss over the last 3 months [   ] anorexia [  ]; fatigue Blue.Reese  ]; nausea [  ]; night sweats [  ]; fever [  ]; or chills [  ];  Eye : blurred vision [  ]; diplopia [   ]; vision changes [  ];  Amaurosis fugax[  ]; Resp: cough [  ];  wheezing[y  ];  hemoptysis[  ];  shortness of breath[  ]; paroxysmal nocturnal dyspnea[  ]; dyspnea on exertion[  ]; or orthopnea[  ];  GI:  gallstones[  ], vomiting[  ];  dysphagia[  ]; melena[  ];  hematochezia [  ]; heartburn[  ];   Hx of  Colonoscopy[  ]; GU: kidney stones [  ]; hematuria[  ];   dysuria [  ];  nocturia[  ];  history of     obstruction [  ]; urinary frequency [  ]             Skin: rash, swelling[  ];, hair loss[  ];  peripheral edema[  ];  or itching[  ]; Musculosketetal: myalgias[  ];  joint swelling[  ];  joint erythema[  ];  joint pain[  ];  back pain[  ];  Heme/Lymph: bruising[  ];  bleeding[  ];  anemia[  ];  Neuro: TIA[  ];  headaches[  ];  stroke[  ];  vertigo[  ];  seizures[  ];   paresthesias[  ];  difficulty walking[y  ];  Psych:depression[  ]; anxiety[  ];  Endocrine: diabetes[  ];  thyroid dysfunction[  ];  Immunizations: Flu up to date Florencio.Farrier  ]; Pneumococcal up to date [ n ]; covid no  Other: Decreased hearing right ear     PHYSICAL EXAMINATION: BP 134/72   Pulse 63   Temp 97.8 F (36.6 C) (Skin)   Resp 20   Ht 5\' 7"  (1.702 m)   Wt 208 lb (94.3 kg)   SpO2 98% Comment: RA  BMI 32.58 kg/m  General appearance: alert, cooperative and no distress Head: Normocephalic, without obvious abnormality, atraumatic Neck: no adenopathy, no carotid bruit, no JVD, supple, symmetrical, trachea midline and thyroid not enlarged, symmetric, no tenderness/mass/nodules Lymph nodes: Cervical, supraclavicular, and axillary nodes normal. Resp: clear to auscultation bilaterally Cardio: regular rate and rhythm, S1, S2 normal, no murmur, click, rub or gallop GI: soft, non-tender; bowel sounds normal; no masses,  no organomegaly Extremities: extremities normal, atraumatic, no cyanosis or edema and Homans sign is negative, no sign of DVT Neurologic: Grossly normal Appears to have adequate veins in both lower extremities possible bypass Bruising in the right forearm related to the right radial catheterization  Diagnostic Studies & Laboratory data:     Recent Radiology Findings:   CARDIAC CATHETERIZATION  Result Date: 01/25/2020  Severe multivessel CAD including LEFT MAIN:  Ost LM lesion is 60% stenosed.  Mid LAD stent has diffuse 70% in-stent restenosis  -- Jailed 1st Diag-1 lesion is 50% stenosed. 1st Diag-2 lesion is 45% stenosed.  Prox Cx lesion is 40% stenosed.  Prox Cx to Mid Cx lesion is 75% stenosed leading up to prior stent.  Mid Cx to Dist Cx stent is 80% stenosed.  Dist Cx lesion is 70% stenosed with 50% stenosed side branch in 4th Mrg.  Prox RCA lesion is 65% stenosed.  Prox RCA to Mid RCA stent has diffuse 70% in-stent restenosis  Dist RCA lesion is 50% stenosed with 50% stenosed side branch in RPDA. RPDA lesion is 100% stenosed.  -------------  The left ventricular systolic function is normal. The left ventricular ejection fraction is 50-55% by visual estimate --anterolateral as well as basal to mid inferior hypokinesis.  LV end diastolic  pressure is normal.  SUMMARY  Severe multivessel CAD including LEFT MAIN:  Eccentril Ostial LM 55% - with "mormal mid-distal LM"  LAD diffuse 70% ISR in mid LAD stent, otherwise mild diffuse disease; moderate ostial and proximal disease in jailed major 1st Diag  LCx mild diffuse disease with 30 to 40% stenosis prior to AV groove followed by calcified 75% stenosis, patent mid LCx stent with distal stent 80% stenosis followed by 70% stenosis crossing 4th Mrg  Proximal to mid RCA eccentric 65% stenosis followed by mid RCA stent with diffuse 70% ISR.  Moderate 50% distal RCA into PDA stenosis with 100% CTO of mid RPDA (fills with left to right collaterals)  Relatively preserved LVEF with what appears to be mild anterolateral hypokinesis.  Normal LVEDP RECOMMENDATIONS  He is stable for discharge since he is not having active resting symptoms.  He has severe multivessel disease and would likely be best served with CABG.  We will place CVTS consultation  for outpatient evaluation next week.  At a minimum he wants a HEART TEAM approach to determine best revascularization option.  The patient initially is voicing concern about whether or not he would want to go through with CABG.  Multivessel PCI is an option based on lesion type.  However there is left main disease and with multivessel PCI, would most likely benefit from Impella support (and given his difficulties with sedation, anesthesiology based sedation/anesthesia)  If he is turned down by CVTS or declines CABG, he would need a CTA of the abdomen pelvis to evaluate for Impella access.  He would also need to be loaded on Clopidogrel 300 mg p.o. x1 followed center milligrams daily.  I will start low-dose bisoprolol plus Imdur.  He is on aspirin and statin. Glenetta Hew, MD    I have independently reviewed the above radiology studies  and reviewed the findings with the patient.   Recent Lab Findings: Lab Results  Component Value Date   WBC 6.2 01/17/2020   HGB 12.7 (L) 01/17/2020   HCT 38.6 01/17/2020   PLT 329 01/17/2020   GLUCOSE 93 01/17/2020   CHOL 206 (H) 09/20/2019   TRIG 135 09/20/2019   HDL 33 (L) 09/20/2019   LDLDIRECT 152.5 10/05/2010   LDLCALC 148 (H) 09/20/2019   ALT 15 09/20/2019   AST 16 09/20/2019   NA 138 01/17/2020   K 5.0 01/17/2020   CL 103 01/17/2020   CREATININE 1.01 01/17/2020   BUN 19 01/17/2020   CO2 24 01/17/2020   TSH 2.514 07/11/2010   INR 0.90 07/11/2010   HGBA1C (H) 07/11/2010    5.9 (NOTE)                                                                       According to the ADA Clinical Practice Recommendations for 2011, when HbA1c is used as a screening test:   >=6.5%   Diagnostic of Diabetes Mellitus           (if abnormal result  is confirmed)  5.7-6.4%   Increased risk of developing Diabetes Mellitus  References:Diagnosis and Classification of Diabetes Mellitus,Diabetes OMBT,5974,16(LAGTX 1):S62-S69 and Standards of Medical Care in          Diabetes - 2011,Diabetes MIWO,0321,22  (  Suppl 1):S11-S61.      Assessment / Plan:   #1 coronary occlusive disease with progression of native disease and restenosis within stents with left main involvement-with the patient's multivessel coronary artery disease including left main obstruction I agree with recommendation to proceed with coronary artery bypass grafting.  I reviewed with the patient the risks and options of bypass surgery, he and his wife had their questions answered we discussed the typical postoperative course and time for recovery.  Patient has not had an echocardiogram that I have a record of we will obtain this preop.  Tentatively plan for surgery on Tuesday August 24th with Covid testing and echocardiogram done on Friday, August 20.    #2 hyperlipidemia-continue treatment per cardiology-has been prescribed PCSK9 inhibitor  The goals risks and alternatives of the planned surgical procedure coronary artery bypass grafting have been discussed with the patient in detail. The risks of the procedure including death, infection, stroke, myocardial infarction, bleeding, blood transfusion have all been discussed specifically.  I have quoted Jeffery Turner a 2 % of perioperative mortality and a complication rate as high as 40 %. The patient's questions have been answered.Mishael A Huckins is willing  to proceed with the planned procedure.  I  spent 60 minutes with  the patient face to face and greater then 50% of the time was spent in counseling and coordination of care.    Grace Isaac MD      [address] [place] [address] Office [phone]     01/29/2020 1:32 PM

## 2020-01-31 NOTE — Progress Notes (Signed)
[place], [place] [address] [place] [address] Phone: [phone] Fax: [phone]      Your procedure is scheduled on August 24  Report to [facility] Main Entrance "A" at 0530 A.M., and check in at the Admitting office.  Call this number if you have problems the morning of surgery:  [phone]  Call [phone] if you have any questions prior to your surgery date Monday-Friday 8am-4pm    Remember:  Do not eat  Or drink after midnight the night before your surgery     Take these medicines the morning of surgery with A SIP OF WATER   bisoprolol (ZEBETA) take the night before surgery if that is when you normally take this medication isosorbide mononitrate (IMDUR)  As of today, STOP taking any Aspirin (unless otherwise instructed by your surgeon) Aleve, Naproxen, Ibuprofen, Motrin, Advil, Goody's, BC's, all herbal medications, fish oil, and all vitamins.                      Do not wear jewelry, make up, or nail polish            Do not wear lotions, powders, perfumes/colognes, or deodorant.              Men may shave face and neck.            Do not bring valuables to the hospital.            [facility] is not responsible for any belongings or valuables.  Do NOT Smoke (Tobacco/Vaping) or drink Alcohol 24 hours prior to your procedure If you use a CPAP at night, you may bring all equipment for your overnight stay.   Contacts, glasses, dentures or bridgework may not be worn into surgery.      For patients admitted to the hospital, discharge time will be determined by your treatment team.   Patients discharged the day of surgery will not be allowed to drive home, and someone needs to stay with them for 24 hours.    Special instructions:   [facility]- Preparing For Surgery  Before surgery, you can play an important role. Because skin is not sterile, your skin needs to be as free of germs as possible. You can reduce  the number of germs on your skin by washing with CHG (chlorahexidine gluconate) Soap before surgery.  CHG is an antiseptic cleaner which kills germs and bonds with the skin to continue killing germs even after washing.    Oral Hygiene is also important to reduce your risk of infection.  Remember - BRUSH YOUR TEETH THE MORNING OF SURGERY WITH YOUR REGULAR TOOTHPASTE  Please do not use if you have an allergy to CHG or antibacterial soaps. If your skin becomes reddened/irritated stop using the CHG.  Do not shave (including legs and underarms) for at least 48 hours prior to first CHG shower. It is OK to shave your face.  Please follow these instructions carefully.   1. Shower the NIGHT BEFORE SURGERY and the MORNING OF SURGERY with CHG Soap.   2. If you chose to wash your hair, wash your hair first as usual with your normal shampoo.  3. After you shampoo, rinse your hair and body thoroughly to remove the shampoo.  4. Use CHG as you would any other liquid soap. You can apply CHG directly to the skin and wash gently with a scrungie or a clean  washcloth.   5. Apply the CHG Soap to your body ONLY FROM THE NECK DOWN.  Do not use on open wounds or open sores. Avoid contact with your eyes, ears, mouth and genitals (private parts). Wash Face and genitals (private parts)  with your normal soap.   6. Wash thoroughly, paying special attention to the area where your surgery will be performed.  7. Thoroughly rinse your body with warm water from the neck down.  8. DO NOT shower/wash with your normal soap after using and rinsing off the CHG Soap.  9. Pat yourself dry with a CLEAN TOWEL.  10. Wear CLEAN PAJAMAS to bed the night before surgery  11. Place CLEAN SHEETS on your bed the night of your first shower and DO NOT SLEEP WITH PETS.   Day of Surgery: Wear Clean/Comfortable clothing the morning of surgery Do not apply any deodorants/lotions.   Remember to brush your teeth WITH YOUR REGULAR  TOOTHPASTE.   Please read over the following fact sheets that you were given.

## 2020-02-01 ENCOUNTER — Ambulatory Visit (HOSPITAL_BASED_OUTPATIENT_CLINIC_OR_DEPARTMENT_OTHER)
Admission: RE | Admit: 2020-02-01 | Discharge: 2020-02-01 | Disposition: A | Discharge status: Home / Self Care | Payer: Medicare HMO | Source: Ambulatory Visit | Attending: Cardiothoracic Surgery | Admitting: Cardiothoracic Surgery

## 2020-02-01 ENCOUNTER — Ambulatory Visit (HOSPITAL_COMMUNITY)
Admission: RE | Admit: 2020-02-01 | Discharge: 2020-02-01 | Disposition: A | Discharge status: Home / Self Care | Payer: Medicare HMO | Source: Ambulatory Visit | Attending: Cardiothoracic Surgery | Admitting: Cardiothoracic Surgery

## 2020-02-01 ENCOUNTER — Other Ambulatory Visit (HOSPITAL_COMMUNITY)
Admission: RE | Admit: 2020-02-01 | Discharge: 2020-02-01 | Disposition: A | Discharge status: Home / Self Care | Payer: Medicare HMO | Source: Ambulatory Visit | Attending: Cardiothoracic Surgery | Admitting: Cardiothoracic Surgery

## 2020-02-01 ENCOUNTER — Other Ambulatory Visit: Payer: Self-pay | Admitting: *Deleted

## 2020-02-01 ENCOUNTER — Encounter (HOSPITAL_COMMUNITY): Payer: Self-pay

## 2020-02-01 ENCOUNTER — Other Ambulatory Visit: Payer: Self-pay

## 2020-02-01 ENCOUNTER — Encounter (HOSPITAL_COMMUNITY)
Admission: RE | Admit: 2020-02-01 | Discharge: 2020-02-01 | Disposition: A | Discharge status: Home / Self Care | Payer: Medicare HMO | Source: Ambulatory Visit | Attending: Cardiothoracic Surgery | Admitting: Cardiothoracic Surgery

## 2020-02-01 DIAGNOSIS — I25110 Atherosclerotic heart disease of native coronary artery with unstable angina pectoris: Secondary | ICD-10-CM | POA: Insufficient documentation

## 2020-02-01 DIAGNOSIS — Z01818 Encounter for other preprocedural examination: Secondary | ICD-10-CM | POA: Diagnosis not present

## 2020-02-01 DIAGNOSIS — I10 Essential (primary) hypertension: Secondary | ICD-10-CM | POA: Diagnosis not present

## 2020-02-01 DIAGNOSIS — I2510 Atherosclerotic heart disease of native coronary artery without angina pectoris: Secondary | ICD-10-CM | POA: Diagnosis not present

## 2020-02-01 DIAGNOSIS — R0789 Other chest pain: Secondary | ICD-10-CM | POA: Diagnosis not present

## 2020-02-01 DIAGNOSIS — I517 Cardiomegaly: Secondary | ICD-10-CM | POA: Insufficient documentation

## 2020-02-01 DIAGNOSIS — I252 Old myocardial infarction: Secondary | ICD-10-CM | POA: Diagnosis not present

## 2020-02-01 DIAGNOSIS — E785 Hyperlipidemia, unspecified: Secondary | ICD-10-CM | POA: Diagnosis not present

## 2020-02-01 DIAGNOSIS — Z20822 Contact with and (suspected) exposure to covid-19: Secondary | ICD-10-CM | POA: Insufficient documentation

## 2020-02-01 DIAGNOSIS — Z87891 Personal history of nicotine dependence: Secondary | ICD-10-CM | POA: Insufficient documentation

## 2020-02-01 DIAGNOSIS — R0602 Shortness of breath: Secondary | ICD-10-CM | POA: Diagnosis not present

## 2020-02-01 HISTORY — DX: Anemia, unspecified: D64.9

## 2020-02-01 HISTORY — DX: Unspecified hearing loss, unspecified ear: H91.90

## 2020-02-01 LAB — COMPREHENSIVE METABOLIC PANEL
ALT: 15 U/L (ref 0–44)
AST: 17 U/L (ref 15–41)
Albumin: 3.8 g/dL (ref 3.5–5.0)
Alkaline Phosphatase: 39 U/L (ref 38–126)
Anion gap: 11 (ref 5–15)
BUN: 21 mg/dL (ref 8–23)
CO2: 19 mmol/L — ABNORMAL LOW (ref 22–32)
Calcium: 9.2 mg/dL (ref 8.9–10.3)
Chloride: 105 mmol/L (ref 98–111)
Creatinine, Ser: 1.12 mg/dL (ref 0.61–1.24)
GFR calc Af Amer: >60 mL/min (ref >60)
GFR calc non Af Amer: >60 mL/min (ref >60)
Glucose, Bld: 96 mg/dL (ref 70–99)
Potassium: 4.3 mmol/L (ref 3.5–5.1)
Sodium: 135 mmol/L (ref 135–145)
Total Bilirubin: 0.7 mg/dL (ref 0.3–1.2)
Total Protein: 6.9 g/dL (ref 6.5–8.1)

## 2020-02-01 LAB — BLOOD GAS, ARTERIAL
Acid-base deficit: 2.3 mmol/L — ABNORMAL HIGH (ref 0.0–2.0)
Bicarbonate: 21.4 mmol/L (ref 20.0–28.0)
Drawn by: 58793
FIO2: 21
O2 Saturation: 98 %
Patient temperature: 37
pCO2 arterial: 33.1 mmHg (ref 32.0–48.0)
pH, Arterial: 7.426 (ref 7.350–7.450)
pO2, Arterial: 105 mmHg (ref 83.0–108.0)

## 2020-02-01 LAB — URINALYSIS, ROUTINE W REFLEX MICROSCOPIC
Bilirubin Urine: NEGATIVE
Glucose, UA: NEGATIVE mg/dL
Hgb urine dipstick: NEGATIVE
Ketones, ur: NEGATIVE mg/dL
Leukocytes,Ua: NEGATIVE
Nitrite: NEGATIVE
Protein, ur: NEGATIVE mg/dL
Specific Gravity, Urine: 1.009 (ref 1.005–1.030)
pH: 7 (ref 5.0–8.0)

## 2020-02-01 LAB — CBC
HCT: 38.8 % — ABNORMAL LOW (ref 39.0–52.0)
Hemoglobin: 13 g/dL (ref 13.0–17.0)
MCH: 31.2 pg (ref 26.0–34.0)
MCHC: 33.5 g/dL (ref 30.0–36.0)
MCV: 93 fL (ref 80.0–100.0)
Platelets: 313 10*3/uL (ref 150–400)
RBC: 4.17 MIL/uL — ABNORMAL LOW (ref 4.22–5.81)
RDW: 12.4 % (ref 11.5–15.5)
WBC: 5.1 10*3/uL (ref 4.0–10.5)
nRBC: 0 % (ref 0.0–0.2)

## 2020-02-01 LAB — ECHOCARDIOGRAM COMPLETE
Area-P 1/2: 2.73 cm2
Height: 67 in
S' Lateral: 2.9 cm
Weight: 3305.6 oz

## 2020-02-01 LAB — TYPE AND SCREEN
ABO/RH(D): A POS
Antibody Screen: NEGATIVE

## 2020-02-01 LAB — PROTIME-INR
INR: 2.9 — ABNORMAL HIGH (ref 0.8–1.2)
Prothrombin Time: 29 seconds — ABNORMAL HIGH (ref 11.4–15.2)

## 2020-02-01 LAB — HEMOGLOBIN A1C
Hgb A1c MFr Bld: 5.6 % (ref 4.8–5.6)
Mean Plasma Glucose: 114.02 mg/dL

## 2020-02-01 LAB — SURGICAL PCR SCREEN
MRSA, PCR: POSITIVE — AB
Staphylococcus aureus: POSITIVE — AB

## 2020-02-01 LAB — APTT: aPTT: >200 seconds (ref 24–36)

## 2020-02-01 LAB — SARS CORONAVIRUS 2 (TAT 6-24 HRS): SARS Coronavirus 2: NEGATIVE

## 2020-02-01 NOTE — Progress Notes (Signed)
Called Ryan at [phone] to advise her of abnormal PT-INR and abnormal PTT results.  Ebony Hail, [place] made aware of abnormal results.

## 2020-02-01 NOTE — Progress Notes (Signed)
  Echocardiogram 2D Echocardiogram has been performed.  Jeffery Turner 02/01/2020, 3:53 PM

## 2020-02-01 NOTE — Progress Notes (Signed)
PCP - Dr Tobie Poet Cardio - Dr Bettina Gavia  Chest x-ray - 02/01/20 EKG - 01/17/20 Stress Test -  ECHO - 01/29/20 Cardiac Cath - 01/25/20  Aspirin Instructions: Patient to call MD for ASA instructions.   Anesthesia review: Yes  Coronavirus Screening Covid test scheduled on 02/01/20. Do you have any of the following symptoms:  Cough yes/no: No Fever (>100.65F)  yes/no: No Runny nose yes/no: No Sore throat yes/no: No Difficulty breathing/shortness of breath:  Some but nothing new  Have you traveled in the last 14 days and where? yes/no: No  Patient verbalized understanding of instructions that were given to them at the PAT appointment.

## 2020-02-02 LAB — VAS US DOPPLER PRE CABG: Single Plane A4C EF: 56.1 %

## 2020-02-04 ENCOUNTER — Encounter (HOSPITAL_COMMUNITY)
Admission: RE | Admit: 2020-02-04 | Discharge: 2020-02-04 | Disposition: A | Discharge status: Home / Self Care | Payer: Medicare HMO | Source: Ambulatory Visit | Attending: Cardiothoracic Surgery | Admitting: Cardiothoracic Surgery

## 2020-02-04 ENCOUNTER — Other Ambulatory Visit: Payer: Self-pay

## 2020-02-04 ENCOUNTER — Ambulatory Visit (HOSPITAL_COMMUNITY)
Admission: RE | Admit: 2020-02-04 | Discharge: 2020-02-04 | Disposition: A | Discharge status: Home / Self Care | Payer: Medicare HMO | Source: Ambulatory Visit | Attending: Cardiothoracic Surgery | Admitting: Cardiothoracic Surgery

## 2020-02-04 DIAGNOSIS — I2510 Atherosclerotic heart disease of native coronary artery without angina pectoris: Secondary | ICD-10-CM

## 2020-02-04 DIAGNOSIS — Z885 Allergy status to narcotic agent status: Secondary | ICD-10-CM | POA: Diagnosis not present

## 2020-02-04 DIAGNOSIS — Z955 Presence of coronary angioplasty implant and graft: Secondary | ICD-10-CM | POA: Diagnosis not present

## 2020-02-04 DIAGNOSIS — J90 Pleural effusion, not elsewhere classified: Secondary | ICD-10-CM | POA: Diagnosis not present

## 2020-02-04 DIAGNOSIS — I252 Old myocardial infarction: Secondary | ICD-10-CM | POA: Diagnosis not present

## 2020-02-04 DIAGNOSIS — I480 Paroxysmal atrial fibrillation: Secondary | ICD-10-CM | POA: Diagnosis not present

## 2020-02-04 DIAGNOSIS — M1990 Unspecified osteoarthritis, unspecified site: Secondary | ICD-10-CM | POA: Diagnosis present

## 2020-02-04 DIAGNOSIS — R110 Nausea: Secondary | ICD-10-CM | POA: Diagnosis not present

## 2020-02-04 DIAGNOSIS — H9190 Unspecified hearing loss, unspecified ear: Secondary | ICD-10-CM | POA: Diagnosis present

## 2020-02-04 DIAGNOSIS — Y712 Prosthetic and other implants, materials and accessory cardiovascular devices associated with adverse incidents: Secondary | ICD-10-CM | POA: Diagnosis present

## 2020-02-04 DIAGNOSIS — H53121 Transient visual loss, right eye: Secondary | ICD-10-CM | POA: Diagnosis present

## 2020-02-04 DIAGNOSIS — T82855A Stenosis of coronary artery stent, initial encounter: Secondary | ICD-10-CM | POA: Diagnosis present

## 2020-02-04 DIAGNOSIS — K5900 Constipation, unspecified: Secondary | ICD-10-CM | POA: Diagnosis not present

## 2020-02-04 DIAGNOSIS — Z8249 Family history of ischemic heart disease and other diseases of the circulatory system: Secondary | ICD-10-CM | POA: Diagnosis not present

## 2020-02-04 DIAGNOSIS — Z79899 Other long term (current) drug therapy: Secondary | ICD-10-CM | POA: Diagnosis not present

## 2020-02-04 DIAGNOSIS — I25119 Atherosclerotic heart disease of native coronary artery with unspecified angina pectoris: Secondary | ICD-10-CM | POA: Diagnosis present

## 2020-02-04 DIAGNOSIS — Z820 Family history of epilepsy and other diseases of the nervous system: Secondary | ICD-10-CM | POA: Diagnosis not present

## 2020-02-04 DIAGNOSIS — E8770 Fluid overload, unspecified: Secondary | ICD-10-CM | POA: Diagnosis not present

## 2020-02-04 DIAGNOSIS — I25118 Atherosclerotic heart disease of native coronary artery with other forms of angina pectoris: Secondary | ICD-10-CM | POA: Diagnosis present

## 2020-02-04 DIAGNOSIS — I371 Nonrheumatic pulmonary valve insufficiency: Secondary | ICD-10-CM | POA: Diagnosis not present

## 2020-02-04 DIAGNOSIS — I088 Other rheumatic multiple valve diseases: Secondary | ICD-10-CM | POA: Diagnosis present

## 2020-02-04 DIAGNOSIS — Z87891 Personal history of nicotine dependence: Secondary | ICD-10-CM | POA: Diagnosis not present

## 2020-02-04 DIAGNOSIS — J811 Chronic pulmonary edema: Secondary | ICD-10-CM | POA: Diagnosis not present

## 2020-02-04 DIAGNOSIS — I081 Rheumatic disorders of both mitral and tricuspid valves: Secondary | ICD-10-CM | POA: Diagnosis not present

## 2020-02-04 DIAGNOSIS — E782 Mixed hyperlipidemia: Secondary | ICD-10-CM | POA: Diagnosis present

## 2020-02-04 DIAGNOSIS — R062 Wheezing: Secondary | ICD-10-CM | POA: Diagnosis not present

## 2020-02-04 DIAGNOSIS — J9811 Atelectasis: Secondary | ICD-10-CM | POA: Diagnosis not present

## 2020-02-04 DIAGNOSIS — I25110 Atherosclerotic heart disease of native coronary artery with unstable angina pectoris: Secondary | ICD-10-CM | POA: Diagnosis not present

## 2020-02-04 DIAGNOSIS — D62 Acute posthemorrhagic anemia: Secondary | ICD-10-CM | POA: Diagnosis not present

## 2020-02-04 LAB — PULMONARY FUNCTION TEST
DL/VA % pred: 69 %
DL/VA: 2.75 ml/min/mmHg/L
DLCO unc % pred: 82 %
DLCO unc: 18.51 ml/min/mmHg
FEF 25-75 Post: 2.27 L/sec
FEF 25-75 Pre: 1.55 L/sec
FEF2575-%Change-Post: 46 %
FEF2575-%Pred-Post: 125 %
FEF2575-%Pred-Pre: 86 %
FEV1-%Change-Post: 7 %
FEV1-%Pred-Post: 105 %
FEV1-%Pred-Pre: 98 %
FEV1-Post: 2.73 L
FEV1-Pre: 2.54 L
FEV1FVC-%Change-Post: 10 %
FEV1FVC-%Pred-Pre: 98 %
FEV6-%Change-Post: 0 %
FEV6-%Pred-Post: 102 %
FEV6-%Pred-Pre: 102 %
FEV6-Post: 3.46 L
FEV6-Pre: 3.44 L
FEV6FVC-%Change-Post: 3 %
FEV6FVC-%Pred-Post: 106 %
FEV6FVC-%Pred-Pre: 103 %
FVC-%Change-Post: -2 %
FVC-%Pred-Post: 96 %
FVC-%Pred-Pre: 99 %
FVC-Post: 3.49 L
FVC-Pre: 3.59 L
Post FEV1/FVC ratio: 78 %
Post FEV6/FVC ratio: 99 %
Pre FEV1/FVC ratio: 71 %
Pre FEV6/FVC Ratio: 96 %
RV % pred: 177 %
RV: 4.36 L
TLC % pred: 126 %
TLC: 8.17 L

## 2020-02-04 LAB — PROTIME-INR
INR: 1 (ref 0.8–1.2)
Prothrombin Time: 13 seconds (ref 11.4–15.2)

## 2020-02-04 LAB — APTT: aPTT: 27 seconds (ref 24–36)

## 2020-02-04 MED ORDER — PHENYLEPHRINE HCL-NACL 20-0.9 MG/250ML-% IV SOLN
30.0000 ug/min | INTRAVENOUS | Status: DC
  Filled 2020-02-04: qty 250

## 2020-02-04 MED ORDER — DEXMEDETOMIDINE HCL IN NACL 400 MCG/100ML IV SOLN
0.1000 ug/kg/h | INTRAVENOUS | Status: DC
  Filled 2020-02-04: qty 100

## 2020-02-04 MED ORDER — SODIUM CHLORIDE 0.9 % IV SOLN
1.5000 g | INTRAVENOUS | Status: AC
  Administered 2020-02-05: 1.5 g via INTRAVENOUS
  Filled 2020-02-04 (×3): qty 1.5

## 2020-02-04 MED ORDER — MAGNESIUM SULFATE 50 % IJ SOLN
40.0000 meq | INTRAMUSCULAR | Status: DC
  Filled 2020-02-04: qty 9.85

## 2020-02-04 MED ORDER — TRANEXAMIC ACID (OHS) PUMP PRIME SOLUTION
2.0000 mg/kg | INTRAVENOUS | Status: DC
  Filled 2020-02-04: qty 1.87

## 2020-02-04 MED ORDER — MILRINONE LACTATE IN DEXTROSE 20-5 MG/100ML-% IV SOLN
0.3000 ug/kg/min | INTRAVENOUS | Status: DC
  Filled 2020-02-04: qty 100

## 2020-02-04 MED ORDER — PLASMA-LYTE 148 IV SOLN
INTRAVENOUS | Status: DC
  Filled 2020-02-04 (×2): qty 2.5

## 2020-02-04 MED ORDER — TRANEXAMIC ACID 1000 MG/10ML IV SOLN
1.5000 mg/kg/h | INTRAVENOUS | Status: DC
  Filled 2020-02-04 (×2): qty 25

## 2020-02-04 MED ORDER — EPINEPHRINE HCL 5 MG/250ML IV SOLN IN NS
0.0000 ug/min | INTRAVENOUS | Status: DC
  Filled 2020-02-04 (×2): qty 250

## 2020-02-04 MED ORDER — SODIUM CHLORIDE 0.9 % IV SOLN
750.0000 mg | INTRAVENOUS | Status: AC
  Administered 2020-02-05: 750 mg via INTRAVENOUS
  Filled 2020-02-04 (×2): qty 750

## 2020-02-04 MED ORDER — POTASSIUM CHLORIDE 2 MEQ/ML IV SOLN
80.0000 meq | INTRAVENOUS | Status: DC
  Filled 2020-02-04: qty 40

## 2020-02-04 MED ORDER — SODIUM CHLORIDE 0.9 % IV SOLN
INTRAVENOUS | Status: DC
  Filled 2020-02-04 (×2): qty 30

## 2020-02-04 MED ORDER — NITROGLYCERIN IN D5W 200-5 MCG/ML-% IV SOLN
2.0000 ug/min | INTRAVENOUS | Status: DC
  Filled 2020-02-04 (×2): qty 250

## 2020-02-04 MED ORDER — ALBUTEROL SULFATE (2.5 MG/3ML) 0.083% IN NEBU
2.5000 mg | INHALATION_SOLUTION | Freq: Once | RESPIRATORY_TRACT | Status: AC
  Administered 2020-02-04: 2.5 mg via RESPIRATORY_TRACT

## 2020-02-04 MED ORDER — TRANEXAMIC ACID (OHS) BOLUS VIA INFUSION
15.0000 mg/kg | INTRAVENOUS | Status: AC
  Administered 2020-02-05: 1405.5 mg via INTRAVENOUS
  Filled 2020-02-04: qty 1406

## 2020-02-04 MED ORDER — INSULIN REGULAR(HUMAN) IN NACL 100-0.9 UT/100ML-% IV SOLN
INTRAVENOUS | Status: DC
  Filled 2020-02-04 (×2): qty 100

## 2020-02-04 MED ORDER — VANCOMYCIN HCL 1500 MG/300ML IV SOLN
1500.0000 mg | INTRAVENOUS | Status: AC
  Administered 2020-02-05: 1500 mg via INTRAVENOUS
  Filled 2020-02-04 (×2): qty 300

## 2020-02-04 MED ORDER — NOREPINEPHRINE 4 MG/250ML-% IV SOLN
0.0000 ug/min | INTRAVENOUS | Status: DC
  Filled 2020-02-04: qty 250

## 2020-02-04 NOTE — Anesthesia Preprocedure Evaluation (Addendum)
Anesthesia Evaluation  Patient identified by MRN, date of birth, ID band Patient awake    Reviewed: Allergy & Precautions, NPO status , Patient's Chart, lab work & pertinent test results  History of Anesthesia Complications Negative for: history of anesthetic complications  Airway Mallampati: I  TM Distance: >3 FB Neck ROM: Full    Dental  (+) Edentulous Upper, Dental Advisory Given   Pulmonary former smoker,    Pulmonary exam normal        Cardiovascular + angina + CAD, + Past MI and + Cardiac Stents  Normal cardiovascular exam     Neuro/Psych negative neurological ROS     GI/Hepatic negative GI ROS, Neg liver ROS,   Endo/Other  negative endocrine ROS  Renal/GU negative Renal ROS     Musculoskeletal negative musculoskeletal ROS (+)   Abdominal   Peds  Hematology negative hematology ROS (+)   Anesthesia Other Findings   Reproductive/Obstetrics                           Anesthesia Physical Anesthesia Plan  ASA: III  Anesthesia Plan: General   Post-op Pain Management:    Induction: Intravenous  PONV Risk Score and Plan: 3 and Ondansetron, Dexamethasone and Midazolam  Airway Management Planned: Oral ETT  Additional Equipment: Arterial line, PA Cath, 3D TEE and Ultrasound Guidance Line Placement  Intra-op Plan:   Post-operative Plan: Post-operative intubation/ventilation  Informed Consent: I have reviewed the patients History and Physical, chart, labs and discussed the procedure including the risks, benefits and alternatives for the proposed anesthesia with the patient or authorized representative who has indicated his/her understanding and acceptance.     Dental advisory given  Plan Discussed with: Anesthesiologist and CRNA  Anesthesia Plan Comments: (Coag studies noted to be markedly abnormal.  Unfortunately we have had a number of recent similar results that were  ultimately found to be spurious on retest.  Dr. Jobie Quaker ordered labs redone 02/04/20 which show coags WNL. )     Anesthesia Quick Evaluation

## 2020-02-05 ENCOUNTER — Inpatient Hospital Stay (HOSPITAL_COMMUNITY): Payer: Medicare HMO

## 2020-02-05 ENCOUNTER — Encounter (HOSPITAL_COMMUNITY)
Admission: RE | Disposition: A | Discharge status: Home / Self Care | Payer: Self-pay | Source: Home / Self Care | Attending: Cardiothoracic Surgery

## 2020-02-05 ENCOUNTER — Encounter (HOSPITAL_COMMUNITY): Payer: Self-pay | Admitting: Cardiothoracic Surgery

## 2020-02-05 ENCOUNTER — Inpatient Hospital Stay (HOSPITAL_COMMUNITY): Payer: Medicare HMO | Admitting: Anesthesiology

## 2020-02-05 ENCOUNTER — Inpatient Hospital Stay (HOSPITAL_COMMUNITY)
Admission: RE | Admit: 2020-02-05 | Discharge: 2020-02-12 | DRG: 236 | Disposition: A | Discharge status: Home / Self Care | Payer: Medicare HMO | Attending: Cardiothoracic Surgery | Admitting: Cardiothoracic Surgery

## 2020-02-05 ENCOUNTER — Inpatient Hospital Stay (HOSPITAL_COMMUNITY): Payer: Medicare HMO | Admitting: Vascular Surgery

## 2020-02-05 ENCOUNTER — Other Ambulatory Visit: Payer: Self-pay

## 2020-02-05 DIAGNOSIS — Z79899 Other long term (current) drug therapy: Secondary | ICD-10-CM | POA: Diagnosis not present

## 2020-02-05 DIAGNOSIS — J90 Pleural effusion, not elsewhere classified: Secondary | ICD-10-CM | POA: Diagnosis not present

## 2020-02-05 DIAGNOSIS — I480 Paroxysmal atrial fibrillation: Secondary | ICD-10-CM | POA: Diagnosis not present

## 2020-02-05 DIAGNOSIS — Z885 Allergy status to narcotic agent status: Secondary | ICD-10-CM | POA: Diagnosis not present

## 2020-02-05 DIAGNOSIS — R062 Wheezing: Secondary | ICD-10-CM | POA: Diagnosis not present

## 2020-02-05 DIAGNOSIS — K5900 Constipation, unspecified: Secondary | ICD-10-CM | POA: Diagnosis not present

## 2020-02-05 DIAGNOSIS — E8770 Fluid overload, unspecified: Secondary | ICD-10-CM | POA: Diagnosis not present

## 2020-02-05 DIAGNOSIS — Y712 Prosthetic and other implants, materials and accessory cardiovascular devices associated with adverse incidents: Secondary | ICD-10-CM | POA: Diagnosis present

## 2020-02-05 DIAGNOSIS — D62 Acute posthemorrhagic anemia: Secondary | ICD-10-CM | POA: Diagnosis not present

## 2020-02-05 DIAGNOSIS — Z951 Presence of aortocoronary bypass graft: Secondary | ICD-10-CM

## 2020-02-05 DIAGNOSIS — H53121 Transient visual loss, right eye: Secondary | ICD-10-CM | POA: Diagnosis present

## 2020-02-05 DIAGNOSIS — Z820 Family history of epilepsy and other diseases of the nervous system: Secondary | ICD-10-CM

## 2020-02-05 DIAGNOSIS — R110 Nausea: Secondary | ICD-10-CM | POA: Diagnosis not present

## 2020-02-05 DIAGNOSIS — Z8249 Family history of ischemic heart disease and other diseases of the circulatory system: Secondary | ICD-10-CM | POA: Diagnosis not present

## 2020-02-05 DIAGNOSIS — H9190 Unspecified hearing loss, unspecified ear: Secondary | ICD-10-CM | POA: Diagnosis present

## 2020-02-05 DIAGNOSIS — I252 Old myocardial infarction: Secondary | ICD-10-CM | POA: Diagnosis not present

## 2020-02-05 DIAGNOSIS — T82855A Stenosis of coronary artery stent, initial encounter: Principal | ICD-10-CM | POA: Diagnosis present

## 2020-02-05 DIAGNOSIS — M1990 Unspecified osteoarthritis, unspecified site: Secondary | ICD-10-CM | POA: Diagnosis present

## 2020-02-05 DIAGNOSIS — E782 Mixed hyperlipidemia: Secondary | ICD-10-CM | POA: Diagnosis present

## 2020-02-05 DIAGNOSIS — I088 Other rheumatic multiple valve diseases: Secondary | ICD-10-CM | POA: Diagnosis present

## 2020-02-05 DIAGNOSIS — J9811 Atelectasis: Secondary | ICD-10-CM | POA: Diagnosis not present

## 2020-02-05 DIAGNOSIS — Z87891 Personal history of nicotine dependence: Secondary | ICD-10-CM

## 2020-02-05 DIAGNOSIS — I2510 Atherosclerotic heart disease of native coronary artery without angina pectoris: Secondary | ICD-10-CM

## 2020-02-05 DIAGNOSIS — Z9689 Presence of other specified functional implants: Secondary | ICD-10-CM

## 2020-02-05 DIAGNOSIS — Z955 Presence of coronary angioplasty implant and graft: Secondary | ICD-10-CM | POA: Diagnosis not present

## 2020-02-05 DIAGNOSIS — I25110 Atherosclerotic heart disease of native coronary artery with unstable angina pectoris: Secondary | ICD-10-CM

## 2020-02-05 DIAGNOSIS — I25118 Atherosclerotic heart disease of native coronary artery with other forms of angina pectoris: Secondary | ICD-10-CM | POA: Diagnosis present

## 2020-02-05 DIAGNOSIS — I25119 Atherosclerotic heart disease of native coronary artery with unspecified angina pectoris: Secondary | ICD-10-CM | POA: Diagnosis present

## 2020-02-05 HISTORY — PX: TEE WITHOUT CARDIOVERSION: SHX5443

## 2020-02-05 HISTORY — PX: ENDOVEIN HARVEST OF GREATER SAPHENOUS VEIN: SHX5059

## 2020-02-05 HISTORY — PX: CORONARY ARTERY BYPASS GRAFT: SHX141

## 2020-02-05 HISTORY — DX: Presence of aortocoronary bypass graft: Z95.1

## 2020-02-05 LAB — CBC
HCT: 31.8 % — ABNORMAL LOW (ref 39.0–52.0)
Hemoglobin: 10.3 g/dL — ABNORMAL LOW (ref 13.0–17.0)
MCH: 30.2 pg (ref 26.0–34.0)
MCHC: 32.4 g/dL (ref 30.0–36.0)
MCV: 93.3 fL (ref 80.0–100.0)
Platelets: 222 10*3/uL (ref 150–400)
RBC: 3.41 MIL/uL — ABNORMAL LOW (ref 4.22–5.81)
RDW: 12.2 % (ref 11.5–15.5)
WBC: 15.2 10*3/uL — ABNORMAL HIGH (ref 4.0–10.5)
nRBC: 0 % (ref 0.0–0.2)

## 2020-02-05 LAB — POCT I-STAT 7, (LYTES, BLD GAS, ICA,H+H)
Acid-base deficit: 4 mmol/L — ABNORMAL HIGH (ref 0.0–2.0)
Acid-base deficit: 5 mmol/L — ABNORMAL HIGH (ref 0.0–2.0)
Bicarbonate: 22 mmol/L (ref 20.0–28.0)
Bicarbonate: 20.7 mmol/L (ref 20.0–28.0)
Calcium, Ion: 1.12 mmol/L — ABNORMAL LOW (ref 1.15–1.40)
Calcium, Ion: 1.1 mmol/L — ABNORMAL LOW (ref 1.15–1.40)
HCT: 29 % — ABNORMAL LOW (ref 39.0–52.0)
HCT: 31 % — ABNORMAL LOW (ref 39.0–52.0)
Hemoglobin: 9.9 g/dL — ABNORMAL LOW (ref 13.0–17.0)
Hemoglobin: 10.5 g/dL — ABNORMAL LOW (ref 13.0–17.0)
O2 Saturation: 96 %
O2 Saturation: 98 %
Patient temperature: 35.9
Patient temperature: 36.6
Potassium: 4.1 mmol/L (ref 3.5–5.1)
Potassium: 4.4 mmol/L (ref 3.5–5.1)
Sodium: 141 mmol/L (ref 135–145)
Sodium: 140 mmol/L (ref 135–145)
TCO2: 23 mmol/L (ref 22–32)
TCO2: 22 mmol/L (ref 22–32)
pCO2 arterial: 42.8 mmHg (ref 32.0–48.0)
pCO2 arterial: 37.1 mmHg (ref 32.0–48.0)
pH, Arterial: 7.313 — ABNORMAL LOW (ref 7.350–7.450)
pH, Arterial: 7.353 (ref 7.350–7.450)
pO2, Arterial: 83 mmHg (ref 83.0–108.0)
pO2, Arterial: 111 mmHg — ABNORMAL HIGH (ref 83.0–108.0)

## 2020-02-05 LAB — COMPREHENSIVE METABOLIC PANEL
ALT: 16 U/L (ref 0–44)
AST: 24 U/L (ref 15–41)
Albumin: 3.5 g/dL (ref 3.5–5.0)
Alkaline Phosphatase: 23 U/L — ABNORMAL LOW (ref 38–126)
Anion gap: 8 (ref 5–15)
BUN: 15 mg/dL (ref 8–23)
CO2: 20 mmol/L — ABNORMAL LOW (ref 22–32)
Calcium: 7.4 mg/dL — ABNORMAL LOW (ref 8.9–10.3)
Chloride: 109 mmol/L (ref 98–111)
Creatinine, Ser: 0.89 mg/dL (ref 0.61–1.24)
GFR calc Af Amer: >60 mL/min (ref >60)
GFR calc non Af Amer: >60 mL/min (ref >60)
Glucose, Bld: 138 mg/dL — ABNORMAL HIGH (ref 70–99)
Potassium: 4.3 mmol/L (ref 3.5–5.1)
Sodium: 137 mmol/L (ref 135–145)
Total Bilirubin: 1 mg/dL (ref 0.3–1.2)
Total Protein: 5.2 g/dL — ABNORMAL LOW (ref 6.5–8.1)

## 2020-02-05 LAB — CBC WITH DIFFERENTIAL/PLATELET
Abs Immature Granulocytes: 0.11 10*3/uL — ABNORMAL HIGH (ref 0.00–0.07)
Basophils Absolute: 0.1 10*3/uL (ref 0.0–0.1)
Basophils Relative: 0 %
Eosinophils Absolute: 0 10*3/uL (ref 0.0–0.5)
Eosinophils Relative: 0 %
HCT: 33.3 % — ABNORMAL LOW (ref 39.0–52.0)
Hemoglobin: 11.1 g/dL — ABNORMAL LOW (ref 13.0–17.0)
Immature Granulocytes: 1 %
Lymphocytes Relative: 4 %
Lymphs Abs: 0.6 10*3/uL — ABNORMAL LOW (ref 0.7–4.0)
MCH: 30.4 pg (ref 26.0–34.0)
MCHC: 33.3 g/dL (ref 30.0–36.0)
MCV: 91.2 fL (ref 80.0–100.0)
Monocytes Absolute: 0.9 10*3/uL (ref 0.1–1.0)
Monocytes Relative: 6 %
Neutro Abs: 13.9 10*3/uL — ABNORMAL HIGH (ref 1.7–7.7)
Neutrophils Relative %: 89 %
Platelets: 240 10*3/uL (ref 150–400)
RBC: 3.65 MIL/uL — ABNORMAL LOW (ref 4.22–5.81)
RDW: 12.3 % (ref 11.5–15.5)
WBC: 15.6 10*3/uL — ABNORMAL HIGH (ref 4.0–10.5)
nRBC: 0 % (ref 0.0–0.2)

## 2020-02-05 LAB — ABO/RH: ABO/RH(D): A POS

## 2020-02-05 LAB — ECHO INTRAOPERATIVE TEE
Height: 67 in
Weight: 3296 oz

## 2020-02-05 LAB — GLUCOSE, CAPILLARY
Glucose-Capillary: 114 mg/dL — ABNORMAL HIGH (ref 70–99)
Glucose-Capillary: 121 mg/dL — ABNORMAL HIGH (ref 70–99)
Glucose-Capillary: 122 mg/dL — ABNORMAL HIGH (ref 70–99)
Glucose-Capillary: 148 mg/dL — ABNORMAL HIGH (ref 70–99)

## 2020-02-05 LAB — HEMOGLOBIN AND HEMATOCRIT, BLOOD
HCT: 27.4 % — ABNORMAL LOW (ref 39.0–52.0)
Hemoglobin: 9.2 g/dL — ABNORMAL LOW (ref 13.0–17.0)

## 2020-02-05 LAB — APTT: aPTT: 32 seconds (ref 24–36)

## 2020-02-05 LAB — PLATELET COUNT: Platelets: 213 10*3/uL (ref 150–400)

## 2020-02-05 LAB — PROTIME-INR
INR: 1.4 — ABNORMAL HIGH (ref 0.8–1.2)
Prothrombin Time: 16.4 seconds — ABNORMAL HIGH (ref 11.4–15.2)

## 2020-02-05 LAB — MAGNESIUM: Magnesium: 2.5 mg/dL — ABNORMAL HIGH (ref 1.7–2.4)

## 2020-02-05 LAB — PHOSPHORUS: Phosphorus: 2.3 mg/dL — ABNORMAL LOW (ref 2.5–4.6)

## 2020-02-05 SURGERY — CORONARY ARTERY BYPASS GRAFTING (CABG)
Anesthesia: General | Site: Leg Upper | Laterality: Right

## 2020-02-05 MED ORDER — PLASMA-LYTE 148 IV SOLN
INTRAVENOUS | Status: DC | PRN
  Administered 2020-02-05: 500 mL

## 2020-02-05 MED ORDER — FENTANYL CITRATE (PF) 250 MCG/5ML IJ SOLN
INTRAMUSCULAR | Status: AC
  Filled 2020-02-05: qty 5

## 2020-02-05 MED ORDER — ASPIRIN 81 MG PO CHEW: 324.0000 mg | CHEWABLE_TABLET | Freq: Every day | ORAL | Status: DC

## 2020-02-05 MED ORDER — METOPROLOL TARTRATE 25 MG/10 ML ORAL SUSPENSION
12.5000 mg | Freq: Two times a day (BID) | ORAL | Status: DC
  Filled 2020-02-05 (×3): qty 5

## 2020-02-05 MED ORDER — LACTATED RINGERS IV SOLN: 500.0000 mL | Freq: Once | INTRAVENOUS | Status: DC | PRN

## 2020-02-05 MED ORDER — ALBUMIN HUMAN 5 % IV SOLN
250.0000 mL | INTRAVENOUS | Status: AC | PRN
  Administered 2020-02-05: 12.5 g via INTRAVENOUS

## 2020-02-05 MED ORDER — VANCOMYCIN HCL IN DEXTROSE 1-5 GM/200ML-% IV SOLN
1000.0000 mg | Freq: Once | INTRAVENOUS | Status: AC
  Administered 2020-02-05: 1000 mg via INTRAVENOUS
  Filled 2020-02-05: qty 200

## 2020-02-05 MED ORDER — CHLORHEXIDINE GLUCONATE 4 % EX LIQD: 30.0000 mL | CUTANEOUS | Status: DC

## 2020-02-05 MED ORDER — ACETAMINOPHEN 650 MG RE SUPP
650.0000 mg | Freq: Once | RECTAL | Status: AC
  Administered 2020-02-05: 650 mg via RECTAL

## 2020-02-05 MED ORDER — TRANEXAMIC ACID 1000 MG/10ML IV SOLN
INTRAVENOUS | Status: DC | PRN
  Administered 2020-02-05: 1.5 mg/kg/h via INTRAVENOUS

## 2020-02-05 MED ORDER — SODIUM CHLORIDE (PF) 0.9 % IJ SOLN
OROMUCOSAL | Status: DC | PRN
  Administered 2020-02-05 (×3): 4 mL via TOPICAL

## 2020-02-05 MED ORDER — ASPIRIN EC 325 MG PO TBEC
325.0000 mg | DELAYED_RELEASE_TABLET | Freq: Every day | ORAL | Status: DC
  Administered 2020-02-06 – 2020-02-12 (×7): 325 mg via ORAL
  Filled 2020-02-05 (×7): qty 1

## 2020-02-05 MED ORDER — ROCURONIUM BROMIDE 10 MG/ML (PF) SYRINGE
PREFILLED_SYRINGE | INTRAVENOUS | Status: AC
  Filled 2020-02-05: qty 10

## 2020-02-05 MED ORDER — FENTANYL CITRATE (PF) 100 MCG/2ML IJ SOLN
INTRAMUSCULAR | Status: DC | PRN
Start: 2020-02-05 — End: 2020-02-05
  Administered 2020-02-05: 250 ug via INTRAVENOUS
  Administered 2020-02-05: 200 ug via INTRAVENOUS
  Administered 2020-02-05: 250 ug via INTRAVENOUS
  Administered 2020-02-05: 100 ug via INTRAVENOUS
  Administered 2020-02-05: 150 ug via INTRAVENOUS
  Administered 2020-02-05: 250 ug via INTRAVENOUS
  Administered 2020-02-05: 50 ug via INTRAVENOUS

## 2020-02-05 MED ORDER — BISACODYL 5 MG PO TBEC
10.0000 mg | DELAYED_RELEASE_TABLET | Freq: Every day | ORAL | Status: DC
  Administered 2020-02-06 – 2020-02-09 (×4): 10 mg via ORAL
  Filled 2020-02-05 (×6): qty 2

## 2020-02-05 MED ORDER — PANTOPRAZOLE SODIUM 40 MG PO TBEC
40.0000 mg | DELAYED_RELEASE_TABLET | Freq: Every day | ORAL | Status: DC
  Administered 2020-02-07 – 2020-02-12 (×6): 40 mg via ORAL
  Filled 2020-02-05 (×6): qty 1

## 2020-02-05 MED ORDER — DEXTROSE 50 % IV SOLN: 0.0000 mL | INTRAVENOUS | Status: DC | PRN

## 2020-02-05 MED ORDER — FAMOTIDINE IN NACL 20-0.9 MG/50ML-% IV SOLN
20.0000 mg | Freq: Two times a day (BID) | INTRAVENOUS | Status: AC
  Administered 2020-02-05 (×2): 20 mg via INTRAVENOUS
  Filled 2020-02-05: qty 50

## 2020-02-05 MED ORDER — MAGNESIUM SULFATE 4 GM/100ML IV SOLN
INTRAVENOUS | Status: AC
  Filled 2020-02-05: qty 100

## 2020-02-05 MED ORDER — METOPROLOL TARTRATE 12.5 MG HALF TABLET
12.5000 mg | ORAL_TABLET | Freq: Two times a day (BID) | ORAL | Status: DC
  Administered 2020-02-06 – 2020-02-09 (×6): 12.5 mg via ORAL
  Filled 2020-02-05 (×7): qty 1

## 2020-02-05 MED ORDER — CHLORHEXIDINE GLUCONATE 0.12 % MT SOLN
15.0000 mL | Freq: Once | OROMUCOSAL | Status: DC
  Filled 2020-02-05: qty 15

## 2020-02-05 MED ORDER — ROCURONIUM BROMIDE 10 MG/ML (PF) SYRINGE
PREFILLED_SYRINGE | INTRAVENOUS | Status: DC | PRN
  Administered 2020-02-05: 50 mg via INTRAVENOUS
  Administered 2020-02-05: 100 mg via INTRAVENOUS
  Administered 2020-02-05 (×2): 50 mg via INTRAVENOUS

## 2020-02-05 MED ORDER — 0.9 % SODIUM CHLORIDE (POUR BTL) OPTIME
TOPICAL | Status: DC | PRN
  Administered 2020-02-05: 5000 mL

## 2020-02-05 MED ORDER — PROPOFOL 10 MG/ML IV BOLUS
INTRAVENOUS | Status: DC | PRN
  Administered 2020-02-05: 90 mg via INTRAVENOUS

## 2020-02-05 MED ORDER — ARTIFICIAL TEARS OPHTHALMIC OINT
TOPICAL_OINTMENT | OPHTHALMIC | Status: DC | PRN
  Administered 2020-02-05: 1 via OPHTHALMIC

## 2020-02-05 MED ORDER — ACETAMINOPHEN 500 MG PO TABS
1000.0000 mg | ORAL_TABLET | Freq: Four times a day (QID) | ORAL | Status: AC
  Administered 2020-02-06 – 2020-02-10 (×19): 1000 mg via ORAL
  Filled 2020-02-05 (×20): qty 2

## 2020-02-05 MED ORDER — HEPARIN SODIUM (PORCINE) 1000 UNIT/ML IJ SOLN
INTRAMUSCULAR | Status: DC | PRN
  Administered 2020-02-05: 32000 [IU] via INTRAVENOUS

## 2020-02-05 MED ORDER — LACTATED RINGERS IV SOLN: INTRAVENOUS | Status: DC

## 2020-02-05 MED ORDER — EPHEDRINE 5 MG/ML INJ
INTRAVENOUS | Status: AC
  Filled 2020-02-05: qty 10

## 2020-02-05 MED ORDER — POTASSIUM CHLORIDE 10 MEQ/50ML IV SOLN: 10.0000 meq | INTRAVENOUS | Status: AC

## 2020-02-05 MED ORDER — ACETAMINOPHEN 160 MG/5ML PO SOLN: 650.0000 mg | Freq: Once | ORAL | Status: AC

## 2020-02-05 MED ORDER — PHENYLEPHRINE HCL-NACL 20-0.9 MG/250ML-% IV SOLN
INTRAVENOUS | Status: DC | PRN
  Administered 2020-02-05: 25 ug/min via INTRAVENOUS

## 2020-02-05 MED ORDER — PHENYLEPHRINE 40 MCG/ML (10ML) SYRINGE FOR IV PUSH (FOR BLOOD PRESSURE SUPPORT)
PREFILLED_SYRINGE | INTRAVENOUS | Status: DC | PRN
  Administered 2020-02-05: 80 ug via INTRAVENOUS
  Administered 2020-02-05: 40 ug via INTRAVENOUS
  Administered 2020-02-05 (×2): 80 ug via INTRAVENOUS

## 2020-02-05 MED ORDER — PROMETHAZINE HCL 25 MG/ML IJ SOLN
12.5000 mg | Freq: Once | INTRAMUSCULAR | Status: AC
  Administered 2020-02-06: 12.5 mg via INTRAVENOUS
  Filled 2020-02-05: qty 1

## 2020-02-05 MED ORDER — FENTANYL CITRATE (PF) 100 MCG/2ML IJ SOLN
25.0000 ug | INTRAMUSCULAR | Status: DC | PRN
  Administered 2020-02-07: 25 ug via INTRAVENOUS
  Filled 2020-02-05: qty 2

## 2020-02-05 MED ORDER — DEXMEDETOMIDINE HCL IN NACL 400 MCG/100ML IV SOLN
0.0000 ug/kg/h | INTRAVENOUS | Status: DC
  Administered 2020-02-05: 0.5 ug/kg/h via INTRAVENOUS
  Filled 2020-02-05: qty 100

## 2020-02-05 MED ORDER — METOPROLOL TARTRATE 5 MG/5ML IV SOLN: 2.5000 mg | INTRAVENOUS | Status: DC | PRN

## 2020-02-05 MED ORDER — INSULIN REGULAR(HUMAN) IN NACL 100-0.9 UT/100ML-% IV SOLN
INTRAVENOUS | Status: DC | PRN
  Administered 2020-02-05: .8 [IU]/h via INTRAVENOUS

## 2020-02-05 MED ORDER — CHLORHEXIDINE GLUCONATE 0.12 % MT SOLN
15.0000 mL | OROMUCOSAL | Status: AC
  Administered 2020-02-05: 15 mL via OROMUCOSAL

## 2020-02-05 MED ORDER — GLYCOPYRROLATE PF 0.2 MG/ML IJ SOSY
PREFILLED_SYRINGE | INTRAMUSCULAR | Status: AC
  Filled 2020-02-05: qty 1

## 2020-02-05 MED ORDER — ROCURONIUM BROMIDE 10 MG/ML (PF) SYRINGE: PREFILLED_SYRINGE | INTRAVENOUS | Status: DC | PRN

## 2020-02-05 MED ORDER — PROTAMINE SULFATE 10 MG/ML IV SOLN
INTRAVENOUS | Status: DC | PRN
  Administered 2020-02-05: 300 mg via INTRAVENOUS

## 2020-02-05 MED ORDER — PROTAMINE SULFATE 10 MG/ML IV SOLN
INTRAVENOUS | Status: AC
  Filled 2020-02-05: qty 5

## 2020-02-05 MED ORDER — ACETAMINOPHEN 160 MG/5ML PO SOLN: 1000.0000 mg | Freq: Four times a day (QID) | ORAL | Status: AC

## 2020-02-05 MED ORDER — SODIUM CHLORIDE 0.9% FLUSH: 3.0000 mL | INTRAVENOUS | Status: DC | PRN

## 2020-02-05 MED ORDER — INSULIN REGULAR(HUMAN) IN NACL 100-0.9 UT/100ML-% IV SOLN: INTRAVENOUS | Status: DC

## 2020-02-05 MED ORDER — PHENYLEPHRINE HCL-NACL 20-0.9 MG/250ML-% IV SOLN
0.0000 ug/min | INTRAVENOUS | Status: DC
  Filled 2020-02-05: qty 250

## 2020-02-05 MED ORDER — TRAMADOL HCL 50 MG PO TABS: 50.0000 mg | ORAL_TABLET | ORAL | Status: DC | PRN

## 2020-02-05 MED ORDER — HEMOSTATIC AGENTS (NO CHARGE) OPTIME
TOPICAL | Status: DC | PRN
  Administered 2020-02-05 (×2): 1 via TOPICAL

## 2020-02-05 MED ORDER — SODIUM CHLORIDE 0.9 % IV SOLN
1.5000 g | Freq: Two times a day (BID) | INTRAVENOUS | Status: AC
  Administered 2020-02-05 – 2020-02-06 (×3): 1.5 g via INTRAVENOUS
  Filled 2020-02-05 (×3): qty 1.5

## 2020-02-05 MED ORDER — BISACODYL 10 MG RE SUPP
10.0000 mg | Freq: Every day | RECTAL | Status: DC
  Filled 2020-02-05: qty 1

## 2020-02-05 MED ORDER — HEPARIN SODIUM (PORCINE) 1000 UNIT/ML IJ SOLN
INTRAMUSCULAR | Status: AC
  Filled 2020-02-05: qty 1

## 2020-02-05 MED ORDER — SODIUM CHLORIDE 0.9 % IV SOLN: INTRAVENOUS | Status: DC

## 2020-02-05 MED ORDER — MIDAZOLAM HCL (PF) 10 MG/2ML IJ SOLN
INTRAMUSCULAR | Status: AC
  Filled 2020-02-05: qty 2

## 2020-02-05 MED ORDER — ONDANSETRON HCL 4 MG/2ML IJ SOLN
4.0000 mg | Freq: Four times a day (QID) | INTRAMUSCULAR | Status: DC | PRN
  Administered 2020-02-05: 4 mg via INTRAVENOUS
  Filled 2020-02-05: qty 2

## 2020-02-05 MED ORDER — DEXMEDETOMIDINE HCL IN NACL 400 MCG/100ML IV SOLN
INTRAVENOUS | Status: DC | PRN
  Administered 2020-02-05: .4 ug/kg/h via INTRAVENOUS

## 2020-02-05 MED ORDER — METOPROLOL TARTRATE 12.5 MG HALF TABLET
12.5000 mg | ORAL_TABLET | Freq: Once | ORAL | Status: DC
  Filled 2020-02-05: qty 1

## 2020-02-05 MED ORDER — GLYCOPYRROLATE 0.2 MG/ML IJ SOLN
INTRAMUSCULAR | Status: DC | PRN
  Administered 2020-02-05 (×2): .2 mg via INTRAVENOUS

## 2020-02-05 MED ORDER — PROTAMINE SULFATE 10 MG/ML IV SOLN
INTRAVENOUS | Status: AC
  Filled 2020-02-05: qty 25

## 2020-02-05 MED ORDER — LACTATED RINGERS IV SOLN: INTRAVENOUS | Status: DC | PRN

## 2020-02-05 MED ORDER — ROCURONIUM BROMIDE 10 MG/ML (PF) SYRINGE
PREFILLED_SYRINGE | INTRAVENOUS | Status: AC
  Filled 2020-02-05: qty 30

## 2020-02-05 MED ORDER — DOCUSATE SODIUM 100 MG PO CAPS
200.0000 mg | ORAL_CAPSULE | Freq: Every day | ORAL | Status: DC
  Administered 2020-02-06 – 2020-02-09 (×4): 200 mg via ORAL
  Filled 2020-02-05 (×6): qty 2

## 2020-02-05 MED ORDER — PROPOFOL 10 MG/ML IV BOLUS
INTRAVENOUS | Status: AC
  Filled 2020-02-05: qty 20

## 2020-02-05 MED ORDER — LIDOCAINE 2% (20 MG/ML) 5 ML SYRINGE
INTRAMUSCULAR | Status: DC | PRN
  Administered 2020-02-05: 100 mg via INTRAVENOUS

## 2020-02-05 MED ORDER — SODIUM CHLORIDE (PF) 0.9 % IJ SOLN
INTRAMUSCULAR | Status: AC
  Filled 2020-02-05: qty 10

## 2020-02-05 MED ORDER — INSULIN ASPART 100 UNIT/ML ~~LOC~~ SOLN
0.0000 [IU] | SUBCUTANEOUS | Status: DC
  Administered 2020-02-05: 2 [IU] via SUBCUTANEOUS
  Administered 2020-02-06: 4 [IU] via SUBCUTANEOUS
  Administered 2020-02-06 (×2): 2 [IU] via SUBCUTANEOUS

## 2020-02-05 MED ORDER — PHENYLEPHRINE 40 MCG/ML (10ML) SYRINGE FOR IV PUSH (FOR BLOOD PRESSURE SUPPORT)
PREFILLED_SYRINGE | INTRAVENOUS | Status: AC
  Filled 2020-02-05: qty 30

## 2020-02-05 MED ORDER — MIDAZOLAM HCL 5 MG/5ML IJ SOLN
INTRAMUSCULAR | Status: DC | PRN
  Administered 2020-02-05: 2 mg via INTRAVENOUS
  Administered 2020-02-05: 1 mg via INTRAVENOUS
  Administered 2020-02-05 (×2): 2 mg via INTRAVENOUS
  Administered 2020-02-05: 3 mg via INTRAVENOUS

## 2020-02-05 MED ORDER — MAGNESIUM SULFATE 4 GM/100ML IV SOLN
4.0000 g | Freq: Once | INTRAVENOUS | Status: AC
  Administered 2020-02-05: 4 g via INTRAVENOUS

## 2020-02-05 MED ORDER — OXYCODONE HCL 5 MG PO TABS
5.0000 mg | ORAL_TABLET | ORAL | Status: DC | PRN
  Administered 2020-02-06: 5 mg via ORAL
  Filled 2020-02-05: qty 1

## 2020-02-05 MED ORDER — SODIUM CHLORIDE 0.9% FLUSH
3.0000 mL | Freq: Two times a day (BID) | INTRAVENOUS | Status: DC
  Administered 2020-02-06 – 2020-02-11 (×9): 3 mL via INTRAVENOUS

## 2020-02-05 MED ORDER — SODIUM CHLORIDE 0.9 % IV SOLN: 1.5000 g | Freq: Two times a day (BID) | INTRAVENOUS | Status: DC

## 2020-02-05 MED ORDER — MIDAZOLAM HCL 2 MG/2ML IJ SOLN: 2.0000 mg | INTRAMUSCULAR | Status: DC | PRN

## 2020-02-05 MED ORDER — FAMOTIDINE IN NACL 20-0.9 MG/50ML-% IV SOLN
INTRAVENOUS | Status: AC
  Filled 2020-02-05: qty 50

## 2020-02-05 MED ORDER — PHENYLEPHRINE HCL-NACL 10-0.9 MG/250ML-% IV SOLN: 0.0000 ug/min | INTRAVENOUS | Status: DC

## 2020-02-05 MED ORDER — SODIUM CHLORIDE 0.9 % IV SOLN
1.5000 g | Freq: Two times a day (BID) | INTRAVENOUS | Status: DC
  Filled 2020-02-05 (×2): qty 1.5

## 2020-02-05 MED ORDER — CHLORHEXIDINE GLUCONATE CLOTH 2 % EX PADS
6.0000 | MEDICATED_PAD | Freq: Every day | CUTANEOUS | Status: DC
  Administered 2020-02-06 – 2020-02-08 (×3): 6 via TOPICAL

## 2020-02-05 MED ORDER — ALBUMIN HUMAN 5 % IV SOLN: INTRAVENOUS | Status: DC | PRN

## 2020-02-05 MED ORDER — METOCLOPRAMIDE HCL 5 MG/ML IJ SOLN
10.0000 mg | Freq: Four times a day (QID) | INTRAMUSCULAR | Status: AC
  Administered 2020-02-05 – 2020-02-06 (×3): 10 mg via INTRAVENOUS
  Filled 2020-02-05 (×3): qty 2

## 2020-02-05 MED ORDER — SODIUM CHLORIDE 0.45 % IV SOLN: INTRAVENOUS | Status: DC | PRN

## 2020-02-05 MED ORDER — PROTAMINE SULFATE 10 MG/ML IV SOLN: INTRAVENOUS | Status: DC | PRN

## 2020-02-05 MED ORDER — SODIUM CHLORIDE 0.9 % IV SOLN: 250.0000 mL | INTRAVENOUS | Status: DC

## 2020-02-05 MED ORDER — ACETAMINOPHEN 500 MG PO TABS
1000.0000 mg | ORAL_TABLET | Freq: Once | ORAL | Status: AC
  Administered 2020-02-05: 1000 mg via ORAL
  Filled 2020-02-05: qty 2

## 2020-02-05 SURGICAL SUPPLY — 84 items
AGENT HMST KT MTR STRL THRMB (HEMOSTASIS) ×3
BAG DECANTER FOR FLEXI CONT (MISCELLANEOUS) ×4 IMPLANT
BLADE CLIPPER SURG (BLADE) ×4 IMPLANT
BLADE MINI RND TIP GREEN BEAV (BLADE) ×1 IMPLANT
BLADE STERNUM SYSTEM 6 (BLADE) ×4 IMPLANT
BLADE SURG 11 STRL SS (BLADE) ×1 IMPLANT
BNDG ELASTIC 4X5.8 VLCR STR LF (GAUZE/BANDAGES/DRESSINGS) ×4 IMPLANT
BNDG ELASTIC 6X5.8 VLCR STR LF (GAUZE/BANDAGES/DRESSINGS) ×4 IMPLANT
BNDG GAUZE ELAST 4 BULKY (GAUZE/BANDAGES/DRESSINGS) ×4 IMPLANT
CANISTER SUCT 3000ML PPV (MISCELLANEOUS) ×4 IMPLANT
CATH CPB KIT GERHARDT (MISCELLANEOUS) ×4 IMPLANT
CATH THORACIC 28FR (CATHETERS) ×4 IMPLANT
CLIP VESOCCLUDE MED 24/CT (CLIP) ×1 IMPLANT
DEFOGGER ANTIFOG KIT (MISCELLANEOUS) ×1 IMPLANT
DRAIN CHANNEL 28F RND 3/8 FF (WOUND CARE) ×4 IMPLANT
DRAPE CARDIOVASCULAR INCISE (DRAPES) ×4
DRAPE SLUSH/WARMER DISC (DRAPES) ×4 IMPLANT
DRAPE SRG 135X102X78XABS (DRAPES) ×3 IMPLANT
DRSG AQUACEL AG ADV 3.5X14 (GAUZE/BANDAGES/DRESSINGS) ×4 IMPLANT
ELECT BLADE 4.0 EZ CLEAN MEGAD (MISCELLANEOUS) ×4
ELECT REM PT RETURN 9FT ADLT (ELECTROSURGICAL) ×8
ELECTRODE BLDE 4.0 EZ CLN MEGD (MISCELLANEOUS) ×3 IMPLANT
ELECTRODE REM PT RTRN 9FT ADLT (ELECTROSURGICAL) ×6 IMPLANT
FELT TEFLON 1X6 (MISCELLANEOUS) ×7 IMPLANT
FILTER SMOKE EVAC ULPA (FILTER) ×4 IMPLANT
GAUZE SPONGE 4X4 12PLY STRL (GAUZE/BANDAGES/DRESSINGS) ×8 IMPLANT
GAUZE SPONGE 4X4 12PLY STRL LF (GAUZE/BANDAGES/DRESSINGS) ×2 IMPLANT
GLOVE BIO SURGEON STRL SZ 6.5 (GLOVE) ×13 IMPLANT
GLOVE BIO SURGEON STRL SZ8 (GLOVE) ×1 IMPLANT
GLOVE BIOGEL PI IND STRL 6 (GLOVE) IMPLANT
GLOVE BIOGEL PI IND STRL 6.5 (GLOVE) IMPLANT
GLOVE BIOGEL PI INDICATOR 6 (GLOVE) ×3
GLOVE BIOGEL PI INDICATOR 6.5 (GLOVE) ×2
GLOVE SURG SS PI 6.0 STRL IVOR (GLOVE) ×2 IMPLANT
GOWN STRL REUS W/ TWL LRG LVL3 (GOWN DISPOSABLE) ×12 IMPLANT
GOWN STRL REUS W/TWL LRG LVL3 (GOWN DISPOSABLE) ×16
HEMOSTAT POWDER SURGIFOAM 1G (HEMOSTASIS) ×12 IMPLANT
HEMOSTAT SURGICEL 2X14 (HEMOSTASIS) ×4 IMPLANT
KIT BASIN OR (CUSTOM PROCEDURE TRAY) ×4 IMPLANT
KIT CATH SUCT 8FR (CATHETERS) ×4 IMPLANT
KIT SUCTION CATH 14FR (SUCTIONS) ×8 IMPLANT
KIT TURNOVER KIT B (KITS) ×4 IMPLANT
KIT VASOVIEW HEMOPRO 2 VH 4000 (KITS) ×4 IMPLANT
LEAD PACING MYOCARDI (MISCELLANEOUS) ×4 IMPLANT
MARKER GRAFT CORONARY BYPASS (MISCELLANEOUS) ×12 IMPLANT
NS IRRIG 1000ML POUR BTL (IV SOLUTION) ×20 IMPLANT
PACK E OPEN HEART (SUTURE) ×4 IMPLANT
PACK OPEN HEART (CUSTOM PROCEDURE TRAY) ×4 IMPLANT
PAD ARMBOARD 7.5X6 YLW CONV (MISCELLANEOUS) ×8 IMPLANT
PAD ELECT DEFIB RADIOL ZOLL (MISCELLANEOUS) ×4 IMPLANT
PENCIL BUTTON HOLSTER BLD 10FT (ELECTRODE) ×4 IMPLANT
PENCIL SMOKE EVACUATOR (MISCELLANEOUS) ×4 IMPLANT
POSITIONER HEAD DONUT 9IN (MISCELLANEOUS) ×4 IMPLANT
PUNCH AORTIC ROTATE 4.0MM (MISCELLANEOUS) ×1 IMPLANT
PUNCH AORTIC ROTATE 4.5MM 8IN (MISCELLANEOUS) ×1 IMPLANT
SET CARDIOPLEGIA MPS 5001102 (MISCELLANEOUS) ×1 IMPLANT
SLEEVE SUCTION 125 (MISCELLANEOUS) ×4 IMPLANT
SPONGE LAP 18X18 RF (DISPOSABLE) ×2 IMPLANT
SPONGE LAP 18X18 X RAY DECT (DISPOSABLE) ×2 IMPLANT
SURGIFLO W/THROMBIN 8M KIT (HEMOSTASIS) ×1 IMPLANT
SUT BONE WAX W31G (SUTURE) ×4 IMPLANT
SUT ETHIBOND 2 0 SH (SUTURE) ×4
SUT ETHIBOND 2 0 SH 36X2 (SUTURE) IMPLANT
SUT MNCRL AB 4-0 PS2 18 (SUTURE) ×1 IMPLANT
SUT PROLENE 3 0 SH1 36 (SUTURE) ×4 IMPLANT
SUT PROLENE 4 0 TF (SUTURE) ×8 IMPLANT
SUT PROLENE 6 0 CC (SUTURE) ×10 IMPLANT
SUT PROLENE 7 0 BV1 MDA (SUTURE) ×6 IMPLANT
SUT PROLENE 8 0 BV175 6 (SUTURE) ×1 IMPLANT
SUT STEEL 6MS V (SUTURE) ×4 IMPLANT
SUT STEEL SZ 6 DBL 3X14 BALL (SUTURE) ×4 IMPLANT
SUT VIC AB 1 CTX 18 (SUTURE) ×8 IMPLANT
SUT VIC AB 2-0 CT1 27 (SUTURE) ×4
SUT VIC AB 2-0 CT1 TAPERPNT 27 (SUTURE) IMPLANT
SYSTEM SAHARA CHEST DRAIN ATS (WOUND CARE) ×4 IMPLANT
TAPE CLOTH SURG 4X10 WHT LF (GAUZE/BANDAGES/DRESSINGS) ×1 IMPLANT
TAPE PAPER 2X10 WHT MICROPORE (GAUZE/BANDAGES/DRESSINGS) ×1 IMPLANT
TOWEL GREEN STERILE (TOWEL DISPOSABLE) ×4 IMPLANT
TOWEL GREEN STERILE FF (TOWEL DISPOSABLE) ×4 IMPLANT
TRAP FLUID SMOKE EVACUATOR (MISCELLANEOUS) ×4 IMPLANT
TRAY FOLEY SLVR 16FR TEMP STAT (SET/KITS/TRAYS/PACK) ×4 IMPLANT
TUBING LAP HI FLOW INSUFFLATIO (TUBING) ×4 IMPLANT
UNDERPAD 30X36 HEAVY ABSORB (UNDERPADS AND DIAPERS) ×4 IMPLANT
WATER STERILE IRR 1000ML POUR (IV SOLUTION) ×8 IMPLANT

## 2020-02-05 NOTE — Anesthesia Procedure Notes (Signed)
Arterial Line Insertion Start/End8/24/2021 7:17 AM, 02/05/2020 7:17 AM Performed by: Kathryne Hitch, CRNA, CRNA  Patient location: Pre-op. Preanesthetic checklist: patient identified, IV checked, site marked, risks and benefits discussed, surgical consent, monitors and equipment checked, pre-op evaluation, timeout performed and anesthesia consent Lidocaine 1% used for infiltration Left, radial was placed Catheter size: 20 Fr Hand hygiene performed  and maximum sterile barriers used   Attempts: 2 Procedure performed without using ultrasound guided technique. Following insertion, dressing applied. Post procedure assessment: normal and unchanged  Patient tolerated the procedure well with no immediate complications.

## 2020-02-05 NOTE — OR Nursing (Signed)
Patient's spouse, Vaughan Basta, was called and updated at 1038.

## 2020-02-05 NOTE — Anesthesia Procedure Notes (Signed)
Procedure Name: Intubation Date/Time: 02/05/2020 8:27 AM Performed by: Kathryne Hitch, CRNA Pre-anesthesia Checklist: Patient identified, Emergency Drugs available, Suction available and Patient being monitored Patient Re-evaluated:Patient Re-evaluated prior to induction Oxygen Delivery Method: Circle system utilized Preoxygenation: Pre-oxygenation with 100% oxygen Induction Type: IV induction Ventilation: Mask ventilation without difficulty Laryngoscope Size: Mac and 4 Grade View: Grade I Tube type: Oral Tube size: 8.0 mm Number of attempts: 1 Airway Equipment and Method: Stylet and Oral airway Placement Confirmation: ETT inserted through vocal cords under direct vision,  positive ETCO2 and breath sounds checked- equal and bilateral Secured at: 24 cm Tube secured with: Tape Dental Injury: Teeth and Oropharynx as per pre-operative assessment

## 2020-02-05 NOTE — Anesthesia Procedure Notes (Signed)
Central Venous Catheter Insertion Performed by: Duane Boston, MD, anesthesiologist Start/End8/24/2021 6:46 AM, 02/05/2020 6:58 AM Patient location: Pre-op. Preanesthetic checklist: patient identified, IV checked, site marked, risks and benefits discussed, surgical consent, monitors and equipment checked, pre-op evaluation, timeout performed and anesthesia consent Position: Trendelenburg Lidocaine 1% used for infiltration and patient sedated Hand hygiene performed , maximum sterile barriers used  and Seldinger technique used Catheter size: 8.5 Fr Total catheter length 8. PA cath was placed.Sheath introducer Swan type:thermodilution PA Cath depth:50 Procedure performed using ultrasound guided technique. Ultrasound Notes:anatomy identified, needle tip was noted to be adjacent to the nerve/plexus identified, no ultrasound evidence of intravascular and/or intraneural injection and image(s) printed for medical record Attempts: 1 Following insertion, line sutured, dressing applied and Biopatch. Post procedure assessment: free fluid flow, blood return through all ports and no air  Patient tolerated the procedure well with no immediate complications.

## 2020-02-05 NOTE — Progress Notes (Signed)
Patient ID: Jeffery Turner, male   DOB: Jan 11, 1941, 79 y.o.   MRN: [id number]  TCTS Evening Rounds:   Hemodynamically stable  CI = 1.9  Has started to wake up on vent.   Urine output good  CT output low  CBC    Component Value Date/Time   WBC 15.2 (H) 02/05/2020 1324   RBC 3.41 (L) 02/05/2020 1324   HGB 10.3 (L) 02/05/2020 1324   HGB 12.7 (L) 01/17/2020 1556   HCT 31.8 (L) 02/05/2020 1324   HCT 38.6 01/17/2020 1556   PLT 222 02/05/2020 1324   PLT 329 01/17/2020 1556   MCV 93.3 02/05/2020 1324   MCV 93 01/17/2020 1556   MCH 30.2 02/05/2020 1324   MCHC 32.4 02/05/2020 1324   RDW 12.2 02/05/2020 1324   RDW 12.5 01/17/2020 1556   LYMPHSABS 1.2 09/20/2019 0854   MONOABS 0.7 08/12/2010 1114   EOSABS 0.2 09/20/2019 0854   BASOSABS 0.0 09/20/2019 0854     BMET    Component Value Date/Time   NA 135 02/01/2020 1011   NA 138 01/17/2020 1556   K 4.3 02/01/2020 1011   CL 105 02/01/2020 1011   CO2 19 (L) 02/01/2020 1011   GLUCOSE 96 02/01/2020 1011   BUN 21 02/01/2020 1011   BUN 19 01/17/2020 1556   CREATININE 1.12 02/01/2020 1011   CALCIUM 9.2 02/01/2020 1011   GFRNONAA >60 02/01/2020 1011   GFRAA >60 02/01/2020 1011     A/P:  Stable postop course. Continue current plans. Weaning to extubate.

## 2020-02-05 NOTE — Procedures (Signed)
Extubation Procedure Note  Patient Details:   Name: Jeffery Turner DOB: 13-Jan-1941 MRN: [id number]   Airway Documentation:    Vent end date: 02/05/20 Vent end time: 1655   Evaluation  O2 sats: stable throughout Complications: No apparent complications Patient did tolerate procedure well. Bilateral Breath Sounds: Clear, Diminished   Yes   Patient extubated per rapid weaning protocol to 4L [place] with no complications. Positive cuff leak was noted prior to extubation. Patient achieved NIF of -28 and VC of .95L with good patient effort. Patient is alert and oriented and is able to speak. Vitals are stable. RT will continue to monitor.   Leonore Frankson Clyda Greener 02/05/2020, 5:03 PM

## 2020-02-05 NOTE — Progress Notes (Signed)
This note also relates to the following rows which could not be included: SpO2 - Cannot attach notes to unvalidated device data  Rapid weaning protocol  

## 2020-02-05 NOTE — Progress Notes (Signed)
Echocardiogram Echocardiogram Transesophageal has been performed.  Oneal Deputy Farrell Broerman 02/05/2020, 8:54 AM

## 2020-02-05 NOTE — Transfer of Care (Signed)
Immediate Anesthesia Transfer of Care Note  Patient: Jeffery Turner  Procedure(s) Performed: CORONARY ARTERY BYPASS GRAFTING (CABG), ON PUMP, TIMES THREE, USING LEFT INTERNAL MAMMARY ARTERY TO LEFT ANTERIOR DESCENDING ARTERY, REVERSE SAPHENOUS VEIN GRAFT TO POSTERIOR DESCENDING ARTERY AND CIRCUMFLEX ARTERY (N/A Chest) TRANSESOPHAGEAL ECHOCARDIOGRAM (TEE) (N/A ) ENDOVEIN HARVEST OF GREATER SAPHENOUS VEIN (Right Leg Upper)  Patient Location: ICU  Anesthesia Type:General  Level of Consciousness: Patient remains intubated per anesthesia plan  Airway & Oxygen Therapy: Patient remains intubated per anesthesia plan and Patient placed on Ventilator (see vital sign flow sheet for setting)  Post-op Assessment: Report given to RN and Post -op Vital signs reviewed and stable  Post vital signs: Reviewed and stable  Last Vitals:  Vitals Value Taken Time  BP    Temp    Pulse    Resp    SpO2      Last Pain:  Vitals:   02/05/20 0616  TempSrc:   PainSc: 0-No pain         Complications: No complications documented.

## 2020-02-05 NOTE — Brief Op Note (Addendum)
      [address]       [place] [address]             [phone]       02/05/2020  1:13 PM  PATIENT:  Jeffery Turner  79 y.o. male  PRE-OPERATIVE DIAGNOSIS:  Coronary Artery Disease  POST-OPERATIVE DIAGNOSIS:  Coronary Artery Disease  PROCEDURE:  Procedure(s): CORONARY ARTERY BYPASS GRAFTING (CABG), ON PUMP, TIMES THREE, USING LEFT INTERNAL MAMMARY ARTERY TO LEFT ANTERIOR DESCENDING ARTERY, REVERSE SAPHENOUS VEIN GRAFT TO POSTERIOR DESCENDING ARTERY AND CIRCUMFLEX ARTERY (N/A) TRANSESOPHAGEAL ECHOCARDIOGRAM (TEE) (N/A) ENDOVEIN HARVEST OF GREATER SAPHENOUS VEIN (Right)   LIMA to LAD SVG to Circ SVG to PDA  SURGEON:  Surgeon(s) and Role:    * Grace Isaac, MD - Primary  PHYSICIAN ASSISTANT:  Nicholes Rough, PA-C   ANESTHESIA:   general  EBL:  800 mL   BLOOD ADMINISTERED:none  DRAINS:  ROUTINE    LOCAL MEDICATIONS USED:  NONE  SPECIMEN:  No Specimen  DISPOSITION OF SPECIMEN:  N/A  COUNTS:  YES  DICTATION: .Dragon Dictation  PLAN OF CARE: Admit to inpatient   PATIENT DISPOSITION:  ICU - intubated and hemodynamically stable.   Delay start of Pharmacological VTE agent (>24hrs) due to surgical blood loss or risk of bleeding: yes

## 2020-02-05 NOTE — OR Nursing (Signed)
Patient's spouse, Vaughan Basta, was called and updated at 1155.

## 2020-02-05 NOTE — Anesthesia Postprocedure Evaluation (Signed)
Anesthesia Post Note  Patient: Jeffery Turner  Procedure(s) Performed: CORONARY ARTERY BYPASS GRAFTING (CABG), ON PUMP, TIMES THREE, USING LEFT INTERNAL MAMMARY ARTERY TO LEFT ANTERIOR DESCENDING ARTERY, REVERSE SAPHENOUS VEIN GRAFT TO POSTERIOR DESCENDING ARTERY AND CIRCUMFLEX ARTERY (N/A Chest) TRANSESOPHAGEAL ECHOCARDIOGRAM (TEE) (N/A ) ENDOVEIN HARVEST OF GREATER SAPHENOUS VEIN (Right Leg Upper)     Patient location during evaluation: SICU Anesthesia Type: General Level of consciousness: sedated Pain management: pain level controlled Vital Signs Assessment: post-procedure vital signs reviewed and stable Respiratory status: patient remains intubated per anesthesia plan Cardiovascular status: stable Postop Assessment: no apparent nausea or vomiting Anesthetic complications: no   No complications documented.  Last Vitals:  Vitals:   02/05/20 1308 02/05/20 1315  BP: (!) 91/55   Pulse: 60 89  Resp: 13 (!) 27  Temp:    SpO2: 97% 96%    Last Pain:  Vitals:   02/05/20 0616  TempSrc:   PainSc: 0-No pain                 Antonio Creswell DANIEL

## 2020-02-05 NOTE — OR Nursing (Signed)
Patient's spouse, Vaughan Basta, was called and updated at 0830.

## 2020-02-05 NOTE — Op Note (Signed)
NAME: Turner, Jeffery A. MEDICAL RECORD [id number] ACCOUNT [id number] DATE OF BIRTH:05/11/1941 FACILITY: [facility] LOCATION: [facility] PHYSICIAN:Daemian Turner Jeffery Bun, MD  OPERATIVE REPORT  DATE OF PROCEDURE:  02/05/2020  PREOPERATIVE DIAGNOSIS:  New onset of angina with coronary occlusive disease.  POSTOPERATIVE DIAGNOSIS:  New onset of angina with coronary occlusive disease.  SURGICAL PROCEDURE:  Coronary artery bypass grafting x3 with the left internal mammary to the left anterior descending coronary artery, reverse saphenous vein graft to the circumflex coronary artery, reverse saphenous vein graft to the proximal posterior  descending coronary artery with right greater saphenous thigh and calf endoscopic vein harvesting.  SURGEON:  Lanelle Bal, MD  FIRST ASSISTANT:  Nicholes Rough, PA.  BRIEF HISTORY:  The patient is a 79 year old male who had recent new onset of angina.  He had undergone cardiac catheterization by Dr. Ellyn Turner and was ultimately discharged home and was seen in the office.  Because of his symptoms and significant  3-vessel coronary artery disease, including areas of restenosis of a previously placed stent in the mid right coronary artery, ostial left main disease, proximal LAD disease of 80% to 90% and 80% to 90% stenosis of the circumflex prior to the takeoff of  2 smaller distal branches, coronary artery bypass grafting was recommended to the patient.  After discussion of risks and options with him, he was willing to proceed and signed informed consent.  DESCRIPTION OF PROCEDURE:  With Swan-Ganz and arterial line monitors in place, the patient underwent general endotracheal anesthesia without incident.  The skin of the chest and legs were prepped with Betadine, draped in the usual sterile manner.   Appropriate timeout was performed and we proceeded with harvesting the right greater saphenous vein in the thigh and upper calf endoscopically.  The vein was of adequate size and  caliber.  Median sternotomy was performed.  The left internal mammary  artery was dissected down as a pedicle graft.  The distal artery was of a large size and had excellent free flow.  The vessel was hydrostatically dilated with heparinized saline.  The pericardium was then opened.  Overall, ventricular function appeared  preserved.  The patient was systemically heparinized.  The ascending aorta was cannulated.  Right atrium was cannulated with a dual stage venous cannula.  The patient was then placed on cardiopulmonary bypass at 2.4 L per minute per meter squared.  Sites  of anastomoses were selected.  The patient's body temperature was cooled to 32 degrees.  Aortic crossclamp was applied and 800 mL of cold blood potassium cardioplegia was administered antegrade with diastolic arrest of the heart.  Myocardial septal  temperature was monitored throughout the crossclamp.  We turned our attention first to the distal right coronary artery and proximal posterior descending.  The distal right coronary artery was significantly calcified.  The proximal posterior descending  was of good quality and caliber without high-grade calcification of the vessel.  With a running 7-0 Prolene, a distal anastomosis with a segment of reverse saphenous vein graft was then done.  Additional cold blood cardioplegia was administered in the  vein graft.  The heart was then elevated and the largest circumflex branch was identified and opened and admitted a 1.5 mm probe proximally and distally.  Using a running 7-0 Prolene, distal anastomosis was performed.  We then turned our attention to the  left anterior descending coronary artery.  The proximal two-thirds of this vessel were intramyocardial.  As it arose from the vessel in the distal third of the vessel,  the LAD was of reasonable size.  It was opened and a 1.5 mm probe passed proximally  and distally.  Using a running 8-0 Prolene, the left internal mammary artery was anastomosed  to the left anterior descending coronary artery.  With crossclamp still in place, two 4 mm punch aortotomies were performed.  Each of the 2 vein grafts were  anastomosed to the ascending aorta.  The bulldog was removed from the mammary artery with prompt rise in myocardial septal temperature.  The heart was allowed to passively fill and deair and the proximal anastomoses were completed.  The aortic crossclamp  was removed with a total crossclamp time of 79 minutes.  Total pump time was 112 minutes.  The patient was then rewarmed to 37 degrees, ventilated and weaned from cardiopulmonary bypass without difficulty.  He was paced to increased rate.  Sites of  anastomoses were inspected and were free of bleeding.  The patient was then ventilated and weaned from cardiopulmonary bypass without difficulty.  He remained hemodynamically stable.  A left pleural tube and a Blake mediastinal drain were left in place.   Pericardium was loosely reapproximated.  Sternum closed with #6 stainless steel wire.  Fascia closed with interrupted 0 Vicryl, running 3-0 Vicryl, subcutaneous tissue, 4-0 subcuticular stitch was done in the skin edges.  Dry dressings were applied.   Sponge and needle count was reported as correct at completion of the procedure.  The patient tolerated the procedure without obvious complication and transferred to surgical intensive care unit for further postoperative care.  The patient did not require  any blood bank blood products during the operative procedure.  VN/NUANCE  D:02/05/2020 T:02/05/2020 JOB:[id number]

## 2020-02-05 NOTE — H&P (Signed)
[address]       [place] [address]             [phone]                    Jeffery Turner [facility] Medical Record #[id number] Date of Birth: 07-02-1940  Referring: Jeffery Man, MD Primary Care: Jeffery Brome, MD Primary Cardiologist: Jeffery More, MD  Chief Complaint:    Chief Complaint  Patient presents with  . Coronary Artery Disease    surgical consult, Cardiac Cath 01/25/20     History of Present Illness:    Jeffery Turner 79 y.o. male  Was  seen in the office   at the request of Dr. Ellyn Hack to consider coronary artery bypass grafting.  The patient has known history of coronary occlusive disease dating back to 42.  At that time he presented with myocardial infarction and underwent stenting of the circumflex coronary artery. he had recurrent anginal symptoms been 2012 and stenting of the LAD and right coronary was done at that time.  The patient notes that over the past several months he has had increasing symptoms similar to previous cardiac symptoms.  Mostly chest tightness with shortness of breath with exertion, he denies rest symptoms.  Because of the repeat symptoms he underwent recent cardiac catheterization   When seen by cardiology he was started started on beta-blocker, but developed increased shortness of breath with this.  He has been prescribed a PCSK9 inhibitor but has not started on  He notes he uses "colloidal silver mist" to prevent developing Covid-the patient has not had a Covid vaccination though his wife notes that she has  Patient denies diabetes denies previous stroke, is a previous smoker quit in December 2001.  Current Activity/ Functional Status:  Patient is independent with mobility/ambulation, transfers, ADL's, IADL's.   Zubrod Score: At the time of surgery this patient's most appropriate activity status/level should be described as: []     0    Normal activity, no symptoms [x]     1    Restricted in physical  strenuous activity but ambulatory, able to do out light work []     2    Ambulatory and capable of self care, unable to do work activities, up and about               >50 % of waking hours                              []     3    Only limited self care, in bed greater than 50% of waking hours []     4    Completely disabled, no self care, confined to bed or chair []     5    Moribund   Past Medical History:  Diagnosis Date  . Anemia   . Angina pectoris ([place]) 02/06/2018  . Anginal equivalent ([place]) 09/19/2019  . CAD (coronary artery disease)    s/p stent to CFX 1997 2/2 MI s/p DES to [facility] and DES mLAD 07/14/10 2/2 [place] cath 07/14/10: dLAD 60%; CFX stent ok; OM1 30-40%; pRCA 30%; PDA 40%; EF 60%  . Constant exophthalmos    right  . Coronary artery disease of native artery of native heart with stable angina pectoris ([place]) 07/21/2010   Qualifier: Diagnosis of  By: Jeffery Turner, CMA, Jeffery Turner    . Epistaxis 07/21/2010  Qualifier: Diagnosis of  By: Jeffery Turner, [place]    . Hearing loss    right eye - no hearing aids  . Hyperlipidemia   . MI (myocardial infarction) ([place])   . Old myocardial infarction 07/21/2010   Qualifier: Diagnosis of  By: Jeffery Turner, Jeffery Turner    . Osteoarthritis   . OSTEOARTHRITIS 07/21/2010   Qualifier: Diagnosis of  By: Jeffery Turner    . Shortness of breath 07/21/2010   Qualifier: Diagnosis of  By: Jeffery Turner, Jeffery Turner    . TOBACCO USE, QUIT 07/21/2010   Qualifier: Diagnosis of  By: Jeffery Turner CMA, Jeffery Turner    . Transient visual loss, right    wears glasses  . UNSTABLE ANGINA 07/21/2010   Qualifier: Diagnosis of  By: Jeffery Turner      Past Surgical History:  Procedure Laterality Date  . CARDIAC CATHETERIZATION    . LEFT HEART CATH AND CORONARY ANGIOGRAPHY N/A 01/25/2020   Procedure: LEFT HEART CATH AND CORONARY ANGIOGRAPHY;  Surgeon: Jeffery Man, MD;  Location: [facility];  Service: Cardiovascular;  Laterality: N/A;  . TONSILLECTOMY AND ADENOIDECTOMY    . WISDOM TOOTH  EXTRACTION      Family History  Problem Relation Age of Onset  . Coronary artery disease Other   . Heart failure Mother   . Alzheimer's disease Mother      Social History   Tobacco Use  Smoking Status Former Smoker  . Packs/day: 1.50  . Years: 37.00  . Pack years: 55.50  . Types: Cigarettes  . Quit date: 05/2000  . Years since quitting: 19.7  Smokeless Tobacco Never Used    Social History   Substance and Sexual Activity  Alcohol Use No     Allergies  Allergen Reactions  . Codeine Nausea And Vomiting    Current Facility-Administered Medications  Medication Dose Route Frequency Provider Last Rate Last Admin  . cefUROXime (ZINACEF) 1.5 g in sodium chloride 0.9 % 100 mL IVPB  1.5 g Intravenous To OR Jeffery Isaac, MD      . cefUROXime (ZINACEF) 750 mg in sodium chloride 0.9 % 100 mL IVPB  750 mg Intravenous To OR Jeffery Isaac, MD      . chlorhexidine (HIBICLENS) 4 % liquid 2 application  30 mL Topical UD Jeffery Isaac, MD      . Derrill Memo ON 02/06/2020] chlorhexidine (PERIDEX) 0.12 % solution 15 mL  15 mL Mouth/Throat Once Jeffery Isaac, MD      . dexmedetomidine (PRECEDEX) 400 MCG/100ML (4 mcg/mL) infusion  0.1-0.7 mcg/kg/hr Intravenous To OR Jeffery Isaac, MD      . EPINEPHrine (ADRENALIN) 4 mg in NS 250 mL (0.016 mg/mL) premix infusion  0-10 mcg/min Intravenous To OR Jeffery Isaac, MD      . heparin 30,000 units/NS 1000 mL solution for CELLSAVER   Other To OR Jeffery Isaac, MD      . heparin sodium (porcine) 2,500 Units, papaverine 30 mg in electrolyte-148 (PLASMALYTE-148) 500 mL irrigation   Irrigation To OR Jeffery Isaac, MD      . insulin regular, human (MYXREDLIN) 100 units/ 100 mL infusion   Intravenous To OR Jeffery Isaac, MD      . magnesium sulfate (IV Push/IM) injection 40 mEq  40 mEq Other To OR Jeffery Isaac, MD      . metoprolol tartrate (LOPRESSOR) tablet 12.5 mg  12.5 mg Oral Once Jeffery Isaac, MD      .  milrinone (PRIMACOR) 20 MG/100 ML (0.2 mg/mL) infusion  0.3 mcg/kg/min Intravenous To OR Jeffery Isaac, MD      . nitroGLYCERIN 50 mg in dextrose 5 % 250 mL (0.2 mg/mL) infusion  2-200 mcg/min Intravenous To OR Jeffery Isaac, MD      . norepinephrine (LEVOPHED) 4mg  in 271mL premix infusion  0-40 mcg/min Intravenous To OR Jeffery Isaac, MD      . phenylephrine (NEOSYNEPHRINE) 20-0.9 MG/250ML-% infusion  30-200 mcg/min Intravenous To OR Jeffery Isaac, MD      . potassium chloride injection 80 mEq  80 mEq Other To OR Jeffery Isaac, MD      . tranexamic acid (CYKLOKAPRON) 2,500 mg in sodium chloride 0.9 % 250 mL (10 mg/mL) infusion  1.5 mg/kg/hr Intravenous To OR Jeffery Isaac, MD      . tranexamic acid (CYKLOKAPRON) bolus via infusion - over 30 minutes 1,405.5 mg  15 mg/kg Intravenous To OR Jeffery Isaac, MD      . tranexamic acid (CYKLOKAPRON) pump prime solution 187 mg  2 mg/kg Intracatheter To OR Jeffery Isaac, MD      . vancomycin (VANCOREADY) IVPB 1500 mg/300 mL  1,500 mg Intravenous To OR Jeffery Isaac, MD       Facility-Administered Medications Ordered in Other Encounters  Medication Dose Route Frequency Provider Last Rate Last Admin  . fentaNYL (SUBLIMAZE) injection   Intravenous Anesthesia Intra-op Jeffery Hitch, CRNA   50 mcg at 02/05/20 0700  . lactated ringers infusion   Intravenous Continuous PRN Jeffery Hitch, CRNA   New Bag at 02/05/20 [phone]  . lactated ringers infusion   Intravenous Continuous PRN Jeffery Hitch, CRNA   New Bag at 02/05/20 0700  . lactated ringers infusion   Intravenous Continuous PRN Jeffery Hitch, CRNA   New Bag at 02/05/20 0700  . midazolam (VERSED) 5 MG/5ML injection   Intravenous Anesthesia Intra-op Jeffery Hitch, CRNA   2 mg at 02/05/20 0700    Pertinent items are noted in HPI.   Review of Systems:     Cardiac Review of Systems: [Y] = yes  or   [ N ] = no   Chest Pain [ y   ]  Resting  SOB [  n ] Exertional SOB  Blue.Reese  ]  Orthopnea [  ]   Pedal Edema [  n ]    Palpitations [n  ] Syncope  [ n ]   Presyncope [ n  ]   General Review of Systems: [Y] = yes [  ]=no Constitional: recent weight change [  ];  Wt loss over the last 3 months [   ] anorexia [  ]; fatigue Blue.Reese  ]; nausea [  ]; night sweats [  ]; fever [  ]; or chills [  ];           Eye : blurred vision [  ]; diplopia [   ]; vision changes [  ];  Amaurosis fugax[  ]; Resp: cough [  ];  wheezing[y  ];  hemoptysis[  ]; shortness of breath[  ]; paroxysmal nocturnal dyspnea[  ]; dyspnea on exertion[  ]; or orthopnea[  ];  GI:  gallstones[  ], vomiting[  ];  dysphagia[  ]; melena[  ];  hematochezia [  ]; heartburn[  ];   Hx of  Colonoscopy[  ]; GU: kidney stones [  ]; hematuria[  ];   dysuria [  ];  nocturia[  ];  history of     obstruction [  ]; urinary frequency [  ]             Skin: rash, swelling[  ];, hair loss[  ];  peripheral edema[  ];  or itching[  ]; Musculosketetal: myalgias[  ];  joint swelling[  ];  joint erythema[  ];  joint pain[  ];  back pain[  ];  Heme/Lymph: bruising[  ];  bleeding[  ];  anemia[  ];  Neuro: TIA[  ];  headaches[  ];  stroke[  ];  vertigo[  ];  seizures[  ];   paresthesias[  ];  difficulty walking[y  ];  Psych:depression[  ]; anxiety[  ];  Endocrine: diabetes[  ];  thyroid dysfunction[  ];  Immunizations: Flu up to date Jeffery Turner  ]; Pneumococcal up to date [ n ]; covid no  Other: Decreased hearing right ear     PHYSICAL EXAMINATION: BP 118/72   Pulse (!) 58   Temp 97.7 F (36.5 C) (Oral)   Resp 18   Ht 5\' 7"  (1.702 m)   Wt 93.4 kg   SpO2 97%   BMI 32.26 kg/m  General appearance: alert, cooperative and no distress Head: Normocephalic, without obvious abnormality, atraumatic Neck: no adenopathy, no carotid bruit, no JVD, supple, symmetrical, trachea midline and thyroid not enlarged, symmetric, no tenderness/mass/nodules Lymph nodes: Cervical, supraclavicular, and axillary nodes normal. Resp:  clear to auscultation bilaterally Cardio: regular rate and rhythm, S1, S2 normal, no murmur, click, rub or gallop GI: soft, non-tender; bowel sounds normal; no masses,  no organomegaly Extremities: extremities normal, atraumatic, no cyanosis or edema and Homans sign is negative, no sign of DVT Neurologic: Grossly normal Appears to have adequate veins in both lower extremities possible bypass Bruising in the right forearm related to the right radial catheterization Diagnostic Studies & Laboratory data:     Recent Radiology Findings:   DG Chest 2 View  Result Date: 02/01/2020 CLINICAL DATA:  Chest tightness and shortness of breath. Preoperative evaluation EXAM: CHEST - 2 VIEW COMPARISON:  January 18, 2020 FINDINGS: Lungs are clear. Heart size and pulmonary vascularity are normal. No adenopathy. There is mild degenerative change in the thoracic spine. Stable small metallic foreign body in the anterior chest wall region. IMPRESSION: Lungs clear.  Cardiac silhouette within normal limits. Electronically Signed   By: Lowella Grip III M.D.   On: 02/01/2020 16:16   CARDIAC CATHETERIZATION  Result Date: 01/25/2020  Severe multivessel CAD including LEFT MAIN:  Ost LM lesion is 60% stenosed.  Mid LAD stent has diffuse 70% in-stent restenosis  -- Jailed 1st Diag-1 lesion is 50% stenosed. 1st Diag-2 lesion is 45% stenosed.  Prox Cx lesion is 40% stenosed.  Prox Cx to Mid Cx lesion is 75% stenosed leading up to prior stent.  Mid Cx to Dist Cx stent is 80% stenosed.  Dist Cx lesion is 70% stenosed with 50% stenosed side branch in 4th Mrg.  Prox RCA lesion is 65% stenosed.  Prox RCA to Mid RCA stent has diffuse 70% in-stent restenosis  Dist RCA lesion is 50% stenosed with 50% stenosed side branch in RPDA. RPDA lesion is 100% stenosed.  -------------  The left ventricular systolic function is normal. The left ventricular ejection fraction is 50-55% by visual estimate --anterolateral as well as basal to  mid inferior hypokinesis.  LV end diastolic pressure is normal.  SUMMARY  Severe multivessel CAD including LEFT MAIN:  Eccentril Ostial LM  55% - with "mormal mid-distal LM"  LAD diffuse 70% ISR in mid LAD stent, otherwise mild diffuse disease; moderate ostial and proximal disease in jailed major 1st Diag  LCx mild diffuse disease with 30 to 40% stenosis prior to AV groove followed by calcified 75% stenosis, patent mid LCx stent with distal stent 80% stenosis followed by 70% stenosis crossing 4th Mrg  Proximal to mid RCA eccentric 65% stenosis followed by mid RCA stent with diffuse 70% ISR.  Moderate 50% distal RCA into PDA stenosis with 100% CTO of mid RPDA (fills with left to right collaterals)  Relatively preserved LVEF with what appears to be mild anterolateral hypokinesis.  Normal LVEDP RECOMMENDATIONS  He is stable for discharge since he is not having active resting symptoms.  He has severe multivessel disease and would likely be best served with CABG.  We will place CVTS consultation for outpatient evaluation next week.  At a minimum he wants a HEART TEAM approach to determine best revascularization option.  The patient initially is voicing concern about whether or not he would want to go through with CABG.  Multivessel PCI is an option based on lesion type.  However there is left main disease and with multivessel PCI, would most likely benefit from Impella support (and given his difficulties with sedation, anesthesiology based sedation/anesthesia)  If he is turned down by CVTS or declines CABG, he would need a CTA of the abdomen pelvis to evaluate for Impella access.  He would also need to be loaded on Clopidogrel 300 mg p.o. x1 followed center milligrams daily.  I will start low-dose bisoprolol plus Imdur.  He is on aspirin and statin. Jeffery Hew, MD  ECHOCARDIOGRAM COMPLETE  Result Date: 02/01/2020    ECHOCARDIOGRAM REPORT   Patient Name:   Jakyle A Bettenhausen Date of Exam: 02/01/2020 Medical  Rec #:  [id number]    Height:       67.0 in Accession #:    [id number]   Weight:       206.0 lb Date of Birth:  1940/07/24   BSA:          2.048 m Patient Age:    27 years     BP:           143/85 mmHg Patient Gender: M            HR:           59 bpm. Exam Location:  Outpatient Procedure: 2D Echo, 3D Echo, Cardiac Doppler, Color Doppler and Strain Analysis Indications:    Z01.818 Encounter for other preprocedural examination; I25.110                 Atherosclerotic heart disease of native coronary artery with                 unstable angina pectoris  History:        Patient has no prior history of Echocardiogram examinations. CAD                 and Previous Myocardial Infarction, Signs/Symptoms:Chest Pain;                 Risk Factors:Former Smoker and Dyslipidemia.  Sonographer:    Jeffery Turner RCT RDMS Referring Phys: [phone] Jeffery Turner [id number]  Sonographer Comments: Technically difficult study due to poor echo windows. Image acquisition challenging due to patient body habitus. IMPRESSIONS  1. Left ventricular ejection fraction, by estimation, is 60 to 65%. The left ventricle has  normal function. The left ventricle has no regional wall motion abnormalities. There is moderate left ventricular hypertrophy. Left ventricular diastolic parameters are consistent with Grade I diastolic dysfunction (impaired relaxation). The average left ventricular global longitudinal strain is 23.4 %. The global longitudinal strain is normal.  2. Right ventricular systolic function is normal. The right ventricular size is normal. Tricuspid regurgitation signal is inadequate for assessing PA pressure.  3. The mitral valve is abnormal. Trivial mitral valve regurgitation.  4. The aortic valve is tricuspid. Aortic valve regurgitation is not visualized.  5. The inferior vena cava is normal in size with greater than 50% respiratory variability, suggesting right atrial pressure of 3 mmHg. FINDINGS  Left Ventricle: Left ventricular ejection  fraction, by estimation, is 60 to 65%. The left ventricle has normal function. The left ventricle has no regional wall motion abnormalities. The average left ventricular global longitudinal strain is 23.4 %. The  global longitudinal strain is normal. The left ventricular internal cavity size was normal in size. There is moderate left ventricular hypertrophy. Left ventricular diastolic parameters are consistent with Grade I diastolic dysfunction (impaired relaxation). Indeterminate filling pressures. Right Ventricle: The right ventricular size is normal. No increase in right ventricular wall thickness. Right ventricular systolic function is normal. Tricuspid regurgitation signal is inadequate for assessing PA pressure. Left Atrium: Left atrial size was normal in size. Right Atrium: Right atrial size was normal in size. Pericardium: There is no evidence of pericardial effusion. Mitral Valve: The mitral valve is abnormal. There is mild thickening of the mitral valve leaflet(s). There is mild calcification of the mitral valve leaflet(s). Trivial mitral valve regurgitation. Tricuspid Valve: The tricuspid valve is grossly normal. Tricuspid valve regurgitation is not demonstrated. Aortic Valve: The aortic valve is tricuspid. Aortic valve regurgitation is not visualized. Pulmonic Valve: The pulmonic valve was grossly normal. Pulmonic valve regurgitation is not visualized. Aorta: The aortic root and ascending aorta are structurally normal, with no evidence of dilitation. Venous: The inferior vena cava is normal in size with greater than 50% respiratory variability, suggesting right atrial pressure of 3 mmHg. IAS/Shunts: No atrial level shunt detected by color flow Doppler.  LEFT VENTRICLE PLAX 2D LVIDd:         4.10 cm  Diastology LVIDs:         2.90 cm  LV e' lateral:   6.96 cm/s LV PW:         1.60 cm  LV E/e' lateral: 10.9 LV IVS:        1.40 cm  LV e' medial:    5.33 cm/s LVOT diam:     2.30 cm  LV E/e' medial:  14.2 LV  SV:         85 LV SV Index:   41       2D Longitudinal Strain LVOT Area:     4.15 cm 2D Strain GLS (A2C):   22.4 %                         2D Strain GLS (A3C):   26.1 %                         2D Strain GLS (A4C):   21.6 %                         2D Strain GLS Avg:     23.4 % RIGHT VENTRICLE TAPSE (  M-mode): 1.9 cm LEFT ATRIUM           Index LA Vol (A2C): 28.2 ml 13.77 ml/m LA Vol (A4C): 28.2 ml 13.77 ml/m  AORTIC VALVE LVOT VTI:    0.204 m  AORTA Ao Root diam: 3.80 cm Ao Asc diam:  2.80 cm MITRAL VALVE MV Area (PHT): 2.73 cm     SHUNTS MV Decel Time: 278 msec     Systemic VTI:  0.20 m MV E velocity: 75.80 cm/s   Systemic Diam: 2.30 cm MV A velocity: 111.00 cm/s MV E/A ratio:  0.68 Jeffery Bishop MD Electronically signed by Jeffery Bishop MD Signature Date/Time: 02/01/2020/4:02:25 PM    Final    VAS US DOPPLER PRE CABG  Result Date: 02/02/2020 PREOPERATIVE VASCULAR EVALUATION  Indications:  Pre-CABG. Risk Factors: Hyperlipidemia, coronary artery disease. Performing Technologist: Jeffery Turner RCT RDMS  Examination Guidelines: A complete evaluation includes B-mode imaging, spectral Doppler, color Doppler, and power Doppler as needed of all accessible portions of each vessel. Bilateral testing is considered an integral part of a complete examination. Limited examinations for reoccurring indications may be performed as noted.  Right Carotid Findings: +----------+--------+--------+--------+--------+------------------+           PSV cm/sEDV cm/sStenosisDescribeComments           +----------+--------+--------+--------+--------+------------------+ CCA Prox  83      19                                         +----------+--------+--------+--------+--------+------------------+ CCA Distal57      16                      intimal thickening +----------+--------+--------+--------+--------+------------------+ ICA Prox  85      32      1-39%           intimal thickening  +----------+--------+--------+--------+--------+------------------+ ICA Distal85      24                                         +----------+--------+--------+--------+--------+------------------+ ECA       334     70      >50%                               +----------+--------+--------+--------+--------+------------------+ Portions of this table do not appear on this page. +----------+--------+-------+--------+------------+           PSV cm/sEDV cmsDescribeArm Pressure +----------+--------+-------+--------+------------+ Subclavian129     14                          +----------+--------+-------+--------+------------+ +---------+--------+--+--------+--+---------+ VertebralPSV cm/s61EDV cm/s19Antegrade +---------+--------+--+--------+--+---------+ Left Carotid Findings: +----------+--------+--------+--------+--------+------------------+           PSV cm/sEDV cm/sStenosisDescribeComments           +----------+--------+--------+--------+--------+------------------+ CCA Prox  80      22                                         +----------+--------+--------+--------+--------+------------------+ CCA Distal70      24  intimal thickening +----------+--------+--------+--------+--------+------------------+ ICA Prox  81      28      1-39%           intimal thickening +----------+--------+--------+--------+--------+------------------+ ICA Distal73      23                                         +----------+--------+--------+--------+--------+------------------+ ECA       319     61      >50%                               +----------+--------+--------+--------+--------+------------------+ +----------+--------+--------+--------+------------+ SubclavianPSV cm/sEDV cm/sDescribeArm Pressure +----------+--------+--------+--------+------------+           121     2                             +----------+--------+--------+--------+------------+ +---------+--------+--+--------+--+---------+ VertebralPSV cm/s49EDV cm/s18Antegrade +---------+--------+--+--------+--+---------+  ABI Findings: +--------+------------------+-----+---------+--------+ Right   Rt Pressure (mmHg)IndexWaveform Comment  +--------+------------------+-----+---------+--------+ [id number]                    triphasic         +--------+------------------+-----+---------+--------+ PTA                            triphasic         +--------+------------------+-----+---------+--------+ DP                             triphasic         +--------+------------------+-----+---------+--------+ +--------+------------------+-----+---------+-------+ Left    Lt Pressure (mmHg)IndexWaveform Comment +--------+------------------+-----+---------+-------+ [id number]                    triphasic        +--------+------------------+-----+---------+-------+ PTA                            triphasic        +--------+------------------+-----+---------+-------+ DP                             triphasic        +--------+------------------+-----+---------+-------+  Right Doppler Findings: +--------+--------+-----+---------+--------+ Site    PressureIndexDoppler  Comments +--------+--------+-----+---------+--------+ [id number]          triphasic         +--------+--------+-----+---------+--------+ Radial               triphasic         +--------+--------+-----+---------+--------+ Ulnar                triphasic         +--------+--------+-----+---------+--------+  Left Doppler Findings: +--------+--------+-----+---------+--------+ Site    PressureIndexDoppler  Comments +--------+--------+-----+---------+--------+ [id number]          triphasic         +--------+--------+-----+---------+--------+ Radial               triphasic          +--------+--------+-----+---------+--------+ Ulnar                triphasic         +--------+--------+-----+---------+--------+  Summary: Right Carotid: Velocities in the right ICA are  consistent with a 1-39% stenosis.                The ECA appears >50% stenosed. Left Carotid: Velocities in the left ICA are consistent with a 1-39% stenosis.               The ECA appears >50% stenosed. Vertebrals: Bilateral vertebral arteries demonstrate antegrade flow. Right Upper Extremity: Doppler waveform obliterate with right radial compression. Doppler waveforms remain within normal limits with right ulnar compression. Left Upper Extremity: Doppler waveforms decrease >50% with left radial compression. Doppler waveforms remain within normal limits with left ulnar compression.  Electronically signed by Jeffery Mayo MD on 02/02/2020 at 6:38:09 AM.    Final      I have independently reviewed the above radiology studies  and reviewed the findings with the patient.   Recent Lab Findings: Lab Results  Component Value Date   WBC 5.1 02/01/2020   HGB 13.0 02/01/2020   HCT 38.8 (L) 02/01/2020   PLT 313 02/01/2020   GLUCOSE 96 02/01/2020   CHOL 206 (H) 09/20/2019   TRIG 135 09/20/2019   HDL 33 (L) 09/20/2019   LDLDIRECT 152.5 10/05/2010   LDLCALC 148 (H) 09/20/2019   ALT 15 02/01/2020   AST 17 02/01/2020   NA 135 02/01/2020   K 4.3 02/01/2020   CL 105 02/01/2020   CREATININE 1.12 02/01/2020   BUN 21 02/01/2020   CO2 19 (L) 02/01/2020   TSH 2.514 07/11/2010   INR 1.0 02/04/2020   HGBA1C 5.6 02/01/2020      Assessment / Plan:   #1 coronary occlusive disease with progression of native disease and restenosis within stents with left main involvement-with the patient's multivessel coronary artery disease including left main obstruction I agree with recommendation to proceed with coronary artery bypass grafting.  I reviewed with the patient the risks and options of bypass surgery, he and his wife  had their questions answered we discussed the typical postoperative course and time for recovery.     #2 hyperlipidemia-continue treatment per cardiology-has been prescribed PCSK9 inhibitor  The goals risks and alternatives of the planned surgical procedure coronary artery bypass grafting have been discussed with the patient in detail. The risks of the procedure including death, infection, stroke, myocardial infarction, bleeding, blood transfusion have all been discussed specifically.  I have quoted Arnet A Trabert a 2 % of perioperative mortality and a complication rate as high as 40 %. The patient's questions have been answered.Lyndon A Koranda is willing  to proceed with the planned procedure.     Jeffery Isaac MD      [address] [place] [address] Office [phone]     02/05/2020 7:18 AM

## 2020-02-06 ENCOUNTER — Inpatient Hospital Stay (HOSPITAL_COMMUNITY): Payer: Medicare HMO

## 2020-02-06 ENCOUNTER — Encounter (HOSPITAL_COMMUNITY): Payer: Self-pay | Admitting: Cardiothoracic Surgery

## 2020-02-06 LAB — BASIC METABOLIC PANEL
Anion gap: 7 (ref 5–15)
Anion gap: 8 (ref 5–15)
BUN: 17 mg/dL (ref 8–23)
BUN: 20 mg/dL (ref 8–23)
CO2: 19 mmol/L — ABNORMAL LOW (ref 22–32)
CO2: 20 mmol/L — ABNORMAL LOW (ref 22–32)
Calcium: 7.4 mg/dL — ABNORMAL LOW (ref 8.9–10.3)
Calcium: 7.9 mg/dL — ABNORMAL LOW (ref 8.9–10.3)
Chloride: 107 mmol/L (ref 98–111)
Chloride: 103 mmol/L (ref 98–111)
Creatinine, Ser: 0.85 mg/dL (ref 0.61–1.24)
Creatinine, Ser: 0.98 mg/dL (ref 0.61–1.24)
GFR calc Af Amer: >60 mL/min (ref >60)
GFR calc Af Amer: >60 mL/min (ref >60)
GFR calc non Af Amer: >60 mL/min (ref >60)
GFR calc non Af Amer: >60 mL/min (ref >60)
Glucose, Bld: 129 mg/dL — ABNORMAL HIGH (ref 70–99)
Glucose, Bld: 138 mg/dL — ABNORMAL HIGH (ref 70–99)
Potassium: 4.1 mmol/L (ref 3.5–5.1)
Potassium: 3.8 mmol/L (ref 3.5–5.1)
Sodium: 133 mmol/L — ABNORMAL LOW (ref 135–145)
Sodium: 131 mmol/L — ABNORMAL LOW (ref 135–145)

## 2020-02-06 LAB — CBC
HCT: 31.6 % — ABNORMAL LOW (ref 39.0–52.0)
HCT: 32.4 % — ABNORMAL LOW (ref 39.0–52.0)
Hemoglobin: 10.5 g/dL — ABNORMAL LOW (ref 13.0–17.0)
Hemoglobin: 10.8 g/dL — ABNORMAL LOW (ref 13.0–17.0)
MCH: 30.6 pg (ref 26.0–34.0)
MCH: 31.6 pg (ref 26.0–34.0)
MCHC: 33.2 g/dL (ref 30.0–36.0)
MCHC: 33.3 g/dL (ref 30.0–36.0)
MCV: 92.1 fL (ref 80.0–100.0)
MCV: 94.7 fL (ref 80.0–100.0)
Platelets: 245 10*3/uL (ref 150–400)
Platelets: 227 10*3/uL (ref 150–400)
RBC: 3.43 MIL/uL — ABNORMAL LOW (ref 4.22–5.81)
RBC: 3.42 MIL/uL — ABNORMAL LOW (ref 4.22–5.81)
RDW: 12.6 % (ref 11.5–15.5)
RDW: 12.8 % (ref 11.5–15.5)
WBC: 10.1 10*3/uL (ref 4.0–10.5)
WBC: 12.2 10*3/uL — ABNORMAL HIGH (ref 4.0–10.5)
nRBC: 0 % (ref 0.0–0.2)
nRBC: 0 % (ref 0.0–0.2)

## 2020-02-06 LAB — POCT I-STAT 7, (LYTES, BLD GAS, ICA,H+H)
Acid-Base Excess: 1 mmol/L (ref 0.0–2.0)
Acid-Base Excess: 0 mmol/L (ref 0.0–2.0)
Acid-base deficit: 4 mmol/L — ABNORMAL HIGH (ref 0.0–2.0)
Acid-base deficit: 1 mmol/L (ref 0.0–2.0)
Acid-base deficit: 4 mmol/L — ABNORMAL HIGH (ref 0.0–2.0)
Acid-base deficit: 5 mmol/L — ABNORMAL HIGH (ref 0.0–2.0)
Bicarbonate: 23.1 mmol/L (ref 20.0–28.0)
Bicarbonate: 24 mmol/L (ref 20.0–28.0)
Bicarbonate: 23.7 mmol/L (ref 20.0–28.0)
Bicarbonate: 21.5 mmol/L (ref 20.0–28.0)
Bicarbonate: 24.2 mmol/L (ref 20.0–28.0)
Bicarbonate: 19.2 mmol/L — ABNORMAL LOW (ref 20.0–28.0)
Calcium, Ion: 1.25 mmol/L (ref 1.15–1.40)
Calcium, Ion: 1 mmol/L — ABNORMAL LOW (ref 1.15–1.40)
Calcium, Ion: 1.06 mmol/L — ABNORMAL LOW (ref 1.15–1.40)
Calcium, Ion: 1.08 mmol/L — ABNORMAL LOW (ref 1.15–1.40)
Calcium, Ion: 1.09 mmol/L — ABNORMAL LOW (ref 1.15–1.40)
Calcium, Ion: 1.07 mmol/L — ABNORMAL LOW (ref 1.15–1.40)
HCT: 32 % — ABNORMAL LOW (ref 39.0–52.0)
HCT: 28 % — ABNORMAL LOW (ref 39.0–52.0)
HCT: 26 % — ABNORMAL LOW (ref 39.0–52.0)
HCT: 26 % — ABNORMAL LOW (ref 39.0–52.0)
HCT: 30 % — ABNORMAL LOW (ref 39.0–52.0)
HCT: 31 % — ABNORMAL LOW (ref 39.0–52.0)
Hemoglobin: 10.9 g/dL — ABNORMAL LOW (ref 13.0–17.0)
Hemoglobin: 9.5 g/dL — ABNORMAL LOW (ref 13.0–17.0)
Hemoglobin: 8.8 g/dL — ABNORMAL LOW (ref 13.0–17.0)
Hemoglobin: 10.2 g/dL — ABNORMAL LOW (ref 13.0–17.0)
Hemoglobin: 8.8 g/dL — ABNORMAL LOW (ref 13.0–17.0)
Hemoglobin: 10.5 g/dL — ABNORMAL LOW (ref 13.0–17.0)
O2 Saturation: 100 %
O2 Saturation: 100 %
O2 Saturation: 100 %
O2 Saturation: 100 %
O2 Saturation: 98 %
O2 Saturation: 97 %
Patient temperature: 36.5
Potassium: 4.4 mmol/L (ref 3.5–5.1)
Potassium: 4.9 mmol/L (ref 3.5–5.1)
Potassium: 5.1 mmol/L (ref 3.5–5.1)
Potassium: 4.2 mmol/L (ref 3.5–5.1)
Potassium: 4.9 mmol/L (ref 3.5–5.1)
Potassium: 4.3 mmol/L (ref 3.5–5.1)
Sodium: 140 mmol/L (ref 135–145)
Sodium: 141 mmol/L (ref 135–145)
Sodium: 138 mmol/L (ref 135–145)
Sodium: 139 mmol/L (ref 135–145)
Sodium: 141 mmol/L (ref 135–145)
Sodium: 139 mmol/L (ref 135–145)
TCO2: 25 mmol/L (ref 22–32)
TCO2: 25 mmol/L (ref 22–32)
TCO2: 25 mmol/L (ref 22–32)
TCO2: 23 mmol/L (ref 22–32)
TCO2: 25 mmol/L (ref 22–32)
TCO2: 20 mmol/L — ABNORMAL LOW (ref 22–32)
pCO2 arterial: 49.4 mmHg — ABNORMAL HIGH (ref 32.0–48.0)
pCO2 arterial: 39.1 mmHg (ref 32.0–48.0)
pCO2 arterial: 30.7 mmHg — ABNORMAL LOW (ref 32.0–48.0)
pCO2 arterial: 38.5 mmHg (ref 32.0–48.0)
pCO2 arterial: 40.3 mmHg (ref 32.0–48.0)
pCO2 arterial: 30.5 mmHg — ABNORMAL LOW (ref 32.0–48.0)
pH, Arterial: 7.278 — ABNORMAL LOW (ref 7.350–7.450)
pH, Arterial: 7.395 (ref 7.350–7.450)
pH, Arterial: 7.496 — ABNORMAL HIGH (ref 7.350–7.450)
pH, Arterial: 7.335 — ABNORMAL LOW (ref 7.350–7.450)
pH, Arterial: 7.406 (ref 7.350–7.450)
pH, Arterial: 7.405 (ref 7.350–7.450)
pO2, Arterial: 390 mmHg — ABNORMAL HIGH (ref 83.0–108.0)
pO2, Arterial: 295 mmHg — ABNORMAL HIGH (ref 83.0–108.0)
pO2, Arterial: 426 mmHg — ABNORMAL HIGH (ref 83.0–108.0)
pO2, Arterial: 106 mmHg (ref 83.0–108.0)
pO2, Arterial: 452 mmHg — ABNORMAL HIGH (ref 83.0–108.0)
pO2, Arterial: 89 mmHg (ref 83.0–108.0)

## 2020-02-06 LAB — POCT I-STAT, CHEM 8
BUN: 19 mg/dL (ref 8–23)
BUN: 16 mg/dL (ref 8–23)
BUN: 18 mg/dL (ref 8–23)
BUN: 17 mg/dL (ref 8–23)
BUN: 16 mg/dL (ref 8–23)
Calcium, Ion: 1.28 mmol/L (ref 1.15–1.40)
Calcium, Ion: 1.03 mmol/L — ABNORMAL LOW (ref 1.15–1.40)
Calcium, Ion: 1.24 mmol/L (ref 1.15–1.40)
Calcium, Ion: 1.08 mmol/L — ABNORMAL LOW (ref 1.15–1.40)
Calcium, Ion: 1.11 mmol/L — ABNORMAL LOW (ref 1.15–1.40)
Chloride: 108 mmol/L (ref 98–111)
Chloride: 104 mmol/L (ref 98–111)
Chloride: 107 mmol/L (ref 98–111)
Chloride: 107 mmol/L (ref 98–111)
Chloride: 104 mmol/L (ref 98–111)
Creatinine, Ser: 0.8 mg/dL (ref 0.61–1.24)
Creatinine, Ser: 0.7 mg/dL (ref 0.61–1.24)
Creatinine, Ser: 0.8 mg/dL (ref 0.61–1.24)
Creatinine, Ser: 0.6 mg/dL — ABNORMAL LOW (ref 0.61–1.24)
Creatinine, Ser: 0.7 mg/dL (ref 0.61–1.24)
Glucose, Bld: 110 mg/dL — ABNORMAL HIGH (ref 70–99)
Glucose, Bld: 109 mg/dL — ABNORMAL HIGH (ref 70–99)
Glucose, Bld: 97 mg/dL (ref 70–99)
Glucose, Bld: 126 mg/dL — ABNORMAL HIGH (ref 70–99)
Glucose, Bld: 122 mg/dL — ABNORMAL HIGH (ref 70–99)
HCT: 33 % — ABNORMAL LOW (ref 39.0–52.0)
HCT: 25 % — ABNORMAL LOW (ref 39.0–52.0)
HCT: 31 % — ABNORMAL LOW (ref 39.0–52.0)
HCT: 26 % — ABNORMAL LOW (ref 39.0–52.0)
HCT: 26 % — ABNORMAL LOW (ref 39.0–52.0)
Hemoglobin: 11.2 g/dL — ABNORMAL LOW (ref 13.0–17.0)
Hemoglobin: 10.5 g/dL — ABNORMAL LOW (ref 13.0–17.0)
Hemoglobin: 8.5 g/dL — ABNORMAL LOW (ref 13.0–17.0)
Hemoglobin: 8.8 g/dL — ABNORMAL LOW (ref 13.0–17.0)
Hemoglobin: 8.8 g/dL — ABNORMAL LOW (ref 13.0–17.0)
Potassium: 4.4 mmol/L (ref 3.5–5.1)
Potassium: 4.8 mmol/L (ref 3.5–5.1)
Potassium: 4.9 mmol/L (ref 3.5–5.1)
Potassium: 5.2 mmol/L — ABNORMAL HIGH (ref 3.5–5.1)
Potassium: 4.1 mmol/L (ref 3.5–5.1)
Sodium: 139 mmol/L (ref 135–145)
Sodium: 138 mmol/L (ref 135–145)
Sodium: 139 mmol/L (ref 135–145)
Sodium: 138 mmol/L (ref 135–145)
Sodium: 141 mmol/L (ref 135–145)
TCO2: 25 mmol/L (ref 22–32)
TCO2: 21 mmol/L — ABNORMAL LOW (ref 22–32)
TCO2: 23 mmol/L (ref 22–32)
TCO2: 23 mmol/L (ref 22–32)
TCO2: 23 mmol/L (ref 22–32)

## 2020-02-06 LAB — GLUCOSE, CAPILLARY
Glucose-Capillary: 117 mg/dL — ABNORMAL HIGH (ref 70–99)
Glucose-Capillary: 120 mg/dL — ABNORMAL HIGH (ref 70–99)
Glucose-Capillary: 126 mg/dL — ABNORMAL HIGH (ref 70–99)
Glucose-Capillary: 127 mg/dL — ABNORMAL HIGH (ref 70–99)
Glucose-Capillary: 127 mg/dL — ABNORMAL HIGH (ref 70–99)
Glucose-Capillary: 147 mg/dL — ABNORMAL HIGH (ref 70–99)
Glucose-Capillary: 150 mg/dL — ABNORMAL HIGH (ref 70–99)
Glucose-Capillary: 161 mg/dL — ABNORMAL HIGH (ref 70–99)

## 2020-02-06 LAB — MAGNESIUM
Magnesium: 2.1 mg/dL (ref 1.7–2.4)
Magnesium: 1.9 mg/dL (ref 1.7–2.4)

## 2020-02-06 MED ORDER — TRAMADOL HCL 50 MG PO TABS
50.0000 mg | ORAL_TABLET | ORAL | Status: DC | PRN
  Administered 2020-02-08 – 2020-02-11 (×7): 50 mg via ORAL
  Filled 2020-02-06 (×7): qty 1

## 2020-02-06 MED ORDER — ENOXAPARIN SODIUM 40 MG/0.4ML ~~LOC~~ SOLN
40.0000 mg | Freq: Every day | SUBCUTANEOUS | Status: DC
  Administered 2020-02-06 – 2020-02-11 (×6): 40 mg via SUBCUTANEOUS
  Filled 2020-02-06 (×6): qty 0.4

## 2020-02-06 MED ORDER — OXYCODONE HCL 5 MG PO TABS
5.0000 mg | ORAL_TABLET | ORAL | Status: DC | PRN
  Administered 2020-02-06 – 2020-02-09 (×9): 5 mg via ORAL
  Filled 2020-02-06 (×9): qty 1

## 2020-02-06 MED ORDER — INSULIN ASPART 100 UNIT/ML ~~LOC~~ SOLN
0.0000 [IU] | SUBCUTANEOUS | Status: DC
  Administered 2020-02-06 – 2020-02-07 (×2): 2 [IU] via SUBCUTANEOUS

## 2020-02-06 MED FILL — Heparin Sodium (Porcine) Inj 1000 Unit/ML: INTRAMUSCULAR | Qty: 30 | Status: AC

## 2020-02-06 MED FILL — Potassium Chloride Inj 2 mEq/ML: INTRAVENOUS | Qty: 40 | Status: AC

## 2020-02-06 MED FILL — Magnesium Sulfate Inj 50%: INTRAMUSCULAR | Qty: 10 | Status: AC

## 2020-02-06 NOTE — Addendum Note (Signed)
Addendum  created 02/06/20 1055 by Josephine Igo, CRNA   Order list changed

## 2020-02-06 NOTE — Progress Notes (Signed)
[facility] Evening Rounds  POD #1 s/p CABG  Stable 24 hours VSS, AF Good UOP  PE: resting comfortably BP 104/89    Pulse 97    Temp 97.9 F (36.6 C) (Oral)    Resp (!) 23    Ht 5\' 7"  (1.702 m)    Wt 95.3 kg    SpO2 98%    BMI 32.89 kg/m  CTA RRR   Intake/Output Summary (Last 24 hours) at 02/06/2020 1736 Last data filed at 02/06/2020 1600 Gross per 24 hour  Intake 1451.86 ml  Output 1405 ml  Net 46.86 ml    A/p: continue present management Diuresis tomorrow [place]ice [place]. Orvan Seen, [place]

## 2020-02-06 NOTE — Hospital Course (Addendum)
History of Present Illness:  Mr. Jeffery Turner is a 79 yo male referred to T[facility] for evaluation of CAD.  The patient has known history of nicotine abuse quitting in 2001 and CAD dating back to 1996.  At that time he presented with a Myocardial Infarction with stenting of Left Circumflex artery.  The patient developed recurrent angina symptoms since 2012 which resulted in stent placement to the LAD and RCA at that time.  Over the past several months the patient has had increasing symptoms of chest tightness and shortness of breath with exertion.  The symptoms do not occur with rest.  He was evaluated by his Cardiologist who felt cardiac catheterization.  This was performed on 01/25/2020 and he was found to have multivessel CAD with preserved 50-55%.  It was felt coronary bypass grafting would be indicated.  He was evaluated by Dr. Servando Snare, who was in agreement the patient would benefit from coronary bypass grafting procedure.  The risks and benefits of the procedure were explained to the patient and he was agreeable to proceed.   Hospital Course:  Mr. Jeffery Turner presented to [facility] on 02/05/2020.  He was taken to the operating room and underwent CABG x 3 utilizing LIMA to LAD, SVG to Circumflex, and SVG to PDA.  He also underwent endoscopic harvest of greater saphenous vein from his right leg.  He tolerated the procedure without difficulty and was taken to the SICU in stable condition.  The patient was extubated the evening of surgery.  During his stay in the SICU the patient was weaned off Neo-synephrine as tolerated.  He had some nausea which was relieved with Reglan.  His chest tubes and arterial lines were removed without difficulty.  He went into a fib and was put on an Amiodarone drip. He then converted to sinus rhythm and was transitioned to oral Amiodarone. He ambulated in the SICU without assistance.  He was started on gentle diuresis for mild volume overload status.  Patient was initially hesitant to  start a statin as he had friends who had difficulties but after discussion with Dr. Servando Snare, he was agreeable to low dose statin. If he has no side effects, statin dose will be increased. He was medically stable for transfer to the progressive care unit on 8/272021.  He continued to make good progress.  His pacing wires were removed without difficulty.  His surgical incisions are healing without evidence of infection.  He was given scheduled Xopenex for wheezing and started on Dulera. He did have expected post op blood loss anemia and was started on oral Ferrous. Last H and H was 9.4 and 28.4. He is medically stable for discharge home today.

## 2020-02-06 NOTE — Discharge Instructions (Signed)

## 2020-02-06 NOTE — Progress Notes (Deleted)
Patient ID: Jeffery Turner, male   DOB: 05-26-1941, 79 y.o.   MRN: [id number] TCTS DAILY ICU PROGRESS NOTE                   [address]            [place] [address]          [phone]   1 Day Post-Op Procedure(s) (LRB): CORONARY ARTERY BYPASS GRAFTING (CABG), ON PUMP, TIMES THREE, USING LEFT INTERNAL MAMMARY ARTERY TO LEFT ANTERIOR DESCENDING ARTERY, REVERSE SAPHENOUS VEIN GRAFT TO POSTERIOR DESCENDING ARTERY AND CIRCUMFLEX ARTERY (N/A) TRANSESOPHAGEAL ECHOCARDIOGRAM (TEE) (N/A) ENDOVEIN HARVEST OF GREATER SAPHENOUS VEIN (Right)  Total Length of Stay:  LOS: 1 day   Subjective: Patient awake alert neurologically intact, major complaints this morning is nausea throughout the night no vomiting, some vertigo when he sits up but without change in blood pressure vital signs.  Patient notes pain control is good on Ultram  Objective: Vital signs in last 24 hours: Temp:  [96.4 F (35.8 C)-99 F (37.2 C)] 99 F (37.2 C) (08/25 0600) Pulse Rate:  [53-93] 88 (08/25 0600) Resp:  [9-27] 19 (08/25 0600) BP: (84-141)/(55-79) 101/64 (08/25 0600) SpO2:  [95 %-100 %] 99 % (08/25 0600) Arterial Line BP: (95-141)/(49-77) 113/59 (08/25 0600) FiO2 (%):  [36 %-50 %] 36 % (08/24 1657) Weight:  [95.3 kg] 95.3 kg (08/25 0500)  Filed Weights   02/05/20 0607 02/06/20 0500  Weight: 93.4 kg 95.3 kg    Weight change: 1.814 kg   Hemodynamic parameters for last 24 hours: PAP: (16-41)/(10-26) 32/23 CO:  [4.1 L/min-6.4 L/min] 6.4 L/min CI:  [2 L/min/m2-3.1 L/min/m2] 3.1 L/min/m2  Intake/Output from previous day: 08/24 0701 - 08/25 0700 In: 4324.3 [I.V.:2790.8; Blood:450; IV Piggyback:1083.5] Out: 9417 [Urine:2555; Blood:800; Chest Tube:450]  Intake/Output this shift: No intake/output data recorded.  Current Meds: Scheduled Meds: . acetaminophen  1,000 mg Oral Q6H   Or  . acetaminophen (TYLENOL) oral liquid 160 mg/5 mL  1,000 mg Per Tube Q6H  . aspirin EC  325 mg Oral Daily    Or  . aspirin  324 mg Per Tube Daily  . bisacodyl  10 mg Oral Daily   Or  . bisacodyl  10 mg Rectal Daily  . Chlorhexidine Gluconate Cloth  6 each Topical Daily  . docusate sodium  200 mg Oral Daily  . insulin aspart  0-24 Units Subcutaneous Q4H  . metoCLOPramide (REGLAN) injection  10 mg Intravenous Q6H  . metoprolol tartrate  12.5 mg Oral BID   Or  . metoprolol tartrate  12.5 mg Per Tube BID  . [START ON 02/07/2020] pantoprazole  40 mg Oral Daily  . promethazine  12.5 mg Intravenous Once  . sodium chloride flush  3 mL Intravenous Q12H   Continuous Infusions: . sodium chloride Stopped (02/05/20 1949)  . sodium chloride    . sodium chloride 20 mL/hr at 02/06/20 0400  . albumin human 12.5 g (02/05/20 1627)  . cefUROXime (ZINACEF)  IV 1.5 g (02/05/20 2100)  . dexmedetomidine (PRECEDEX) IV infusion Stopped (02/05/20 1926)  . lactated ringers    . lactated ringers Stopped (02/05/20 1315)  . lactated ringers 20 mL/hr at 02/05/20 1315  . phenylephrine (NEO-SYNEPHRINE) Adult infusion 20 mcg/min (02/05/20 1449)   PRN Meds:.sodium chloride, albumin human, fentaNYL (SUBLIMAZE) injection, lactated ringers, metoprolol tartrate, midazolam, ondansetron (ZOFRAN) IV, oxyCODONE, sodium chloride flush, traMADol  General appearance: alert, cooperative and no distress Neurologic: intact Heart: regular rate and  rhythm, S1, S2 normal, no murmur, click, rub or gallop Lungs: diminished breath sounds bibasilar Abdomen: soft, non-tender; bowel sounds normal; no masses,  no organomegaly Abdomen not distended bowel sounds active Extremities: extremities normal, atraumatic, no cyanosis or edema and Homans sign is negative, no sign of DVT Wound: Sternal dressing intact sternum stable   Lab Results: CBC: Recent Labs    02/05/20 1830 02/05/20 1830 02/05/20 1833 02/06/20 0419  WBC 15.6*  --   --  10.1  HGB 11.1*   < > 10.5* 10.5*  HCT 33.3*   < > 31.0* 31.6*  PLT 240  --   --  245   < > = values in  this interval not displayed.   BMET:  Recent Labs    02/05/20 1830 02/05/20 1830 02/05/20 1833 02/06/20 0419  NA 137   < > 139 133*  K 4.3   < > 4.3 4.1  CL 109  --   --  107  CO2 20*  --   --  19*  GLUCOSE 138*  --   --  129*  BUN 15  --   --  17  CREATININE 0.89  --   --  0.85  CALCIUM 7.4*  --   --  7.4*   < > = values in this interval not displayed.    CMET: Lab Results  Component Value Date   WBC 10.1 02/06/2020   HGB 10.5 (L) 02/06/2020   HCT 31.6 (L) 02/06/2020   PLT 245 02/06/2020   GLUCOSE 129 (H) 02/06/2020   CHOL 206 (H) 09/20/2019   TRIG 135 09/20/2019   HDL 33 (L) 09/20/2019   LDLDIRECT 152.5 10/05/2010   LDLCALC 148 (H) 09/20/2019   ALT 16 02/05/2020   AST 24 02/05/2020   NA 133 (L) 02/06/2020   K 4.1 02/06/2020   CL 107 02/06/2020   CREATININE 0.85 02/06/2020   BUN 17 02/06/2020   CO2 19 (L) 02/06/2020   TSH 2.514 07/11/2010   INR 1.4 (H) 02/05/2020   HGBA1C 5.6 02/01/2020      PT/INR:  Recent Labs    02/05/20 1324  LABPROT 16.4*  INR 1.4*   Radiology: [facility] Chest Port 1 View  Result Date: 02/05/2020 CLINICAL DATA:  Status post CABG today. EXAM: PORTABLE CHEST 1 VIEW COMPARISON:  PA and lateral chest 02/01/2020. FINDINGS: Endotracheal tube is in place with its tip approximately 4 cm above the carina. Right IJ approach Swan-Ganz catheter tip is in the proximal right main pulmonary artery. NG tube courses into the stomach and below the inferior margin of the film. Left chest tube is noted. There is mild subsegmental atelectasis in the lung bases. No pulmonary edema or consolidative process. No pneumothorax. Heart size normal. IMPRESSION: Support tubes and lines projecting good position. Negative for pneumothorax or acute disease. Electronically Signed   By: Inge Rise M.D.   On: 02/05/2020 13:40      Assessment/Plan: S/P Procedure(s) (LRB): CORONARY ARTERY BYPASS GRAFTING (CABG), ON PUMP, TIMES THREE, USING LEFT INTERNAL MAMMARY ARTERY TO  LEFT ANTERIOR DESCENDING ARTERY, REVERSE SAPHENOUS VEIN GRAFT TO POSTERIOR DESCENDING ARTERY AND CIRCUMFLEX ARTERY (N/A) TRANSESOPHAGEAL ECHOCARDIOGRAM (TEE) (N/A) ENDOVEIN HARVEST OF GREATER SAPHENOUS VEIN (Right) Mobilize Severe nausea is the main limiting factor to his mobilization currently We will start trial of Reglan to see if this helps the nausea Monitor response to pain medicine DC Foley   Grace Isaac 02/06/2020 7:15 AM

## 2020-02-06 NOTE — Progress Notes (Addendum)
TCTS DAILY ICU PROGRESS NOTE                   [address]            [place] [address]          [phone]   1 Day Post-Op Procedure(s) (LRB): CORONARY ARTERY BYPASS GRAFTING (CABG), ON PUMP, TIMES THREE, USING LEFT INTERNAL MAMMARY ARTERY TO LEFT ANTERIOR DESCENDING ARTERY, REVERSE SAPHENOUS VEIN GRAFT TO POSTERIOR DESCENDING ARTERY AND CIRCUMFLEX ARTERY (N/A) TRANSESOPHAGEAL ECHOCARDIOGRAM (TEE) (N/A) ENDOVEIN HARVEST OF GREATER SAPHENOUS VEIN (Right)  Total Length of Stay:  LOS: 1 day   Subjective:  Nausea overnight, resolved... Sitting up in bed eating breakfast.  Pain is controlled.  Objective: Vital signs in last 24 hours: Temp:  [96.4 F (35.8 C)-99 F (37.2 C)] 99 F (37.2 C) (08/25 0600) Pulse Rate:  [53-93] 88 (08/25 0600) Resp:  [9-27] 19 (08/25 0600) BP: (84-141)/(55-79) 101/64 (08/25 0600) SpO2:  [95 %-100 %] 99 % (08/25 0600) Arterial Line BP: (95-141)/(49-77) 113/59 (08/25 0600) FiO2 (%):  [36 %-50 %] 36 % (08/24 1657) Weight:  [95.3 kg] 95.3 kg (08/25 0500)  Filed Weights   02/05/20 0607 02/06/20 0500  Weight: 93.4 kg 95.3 kg    Weight change: 1.814 kg   Hemodynamic parameters for last 24 hours: PAP: (16-41)/(10-26) 32/23 CO:  [4.1 L/min-6.4 L/min] 6.4 L/min CI:  [2 L/min/m2-3.1 L/min/m2] 3.1 L/min/m2  Intake/Output from previous day: 08/24 0701 - 08/25 0700 In: 4324.3 [I.V.:2790.8; Blood:450; IV Piggyback:1083.5] Out: 8101 [Urine:2555; Blood:800; Chest Tube:450]  Current Meds: Scheduled Meds: . acetaminophen  1,000 mg Oral Q6H   Or  . acetaminophen (TYLENOL) oral liquid 160 mg/5 mL  1,000 mg Per Tube Q6H  . aspirin EC  325 mg Oral Daily   Or  . aspirin  324 mg Per Tube Daily  . bisacodyl  10 mg Oral Daily   Or  . bisacodyl  10 mg Rectal Daily  . Chlorhexidine Gluconate Cloth  6 each Topical Daily  . docusate sodium  200 mg Oral Daily  . insulin aspart  0-24 Units Subcutaneous Q4H  . metoCLOPramide (REGLAN) injection   10 mg Intravenous Q6H  . metoprolol tartrate  12.5 mg Oral BID   Or  . metoprolol tartrate  12.5 mg Per Tube BID  . [START ON 02/07/2020] pantoprazole  40 mg Oral Daily  . promethazine  12.5 mg Intravenous Once  . sodium chloride flush  3 mL Intravenous Q12H   Continuous Infusions: . sodium chloride Stopped (02/05/20 1949)  . sodium chloride    . sodium chloride 20 mL/hr at 02/06/20 0400  . albumin human 12.5 g (02/05/20 1627)  . cefUROXime (ZINACEF)  IV 1.5 g (02/05/20 2100)  . dexmedetomidine (PRECEDEX) IV infusion Stopped (02/05/20 1926)  . lactated ringers    . lactated ringers Stopped (02/05/20 1315)  . lactated ringers 20 mL/hr at 02/05/20 1315  . phenylephrine (NEO-SYNEPHRINE) Adult infusion 20 mcg/min (02/05/20 1449)   PRN Meds:.sodium chloride, albumin human, fentaNYL (SUBLIMAZE) injection, lactated ringers, metoprolol tartrate, midazolam, ondansetron (ZOFRAN) IV, oxyCODONE, sodium chloride flush, traMADol  General appearance: alert, cooperative and no distress Heart: regular rate and rhythm Lungs: clear to auscultation bilaterally Abdomen: soft, non-tender; bowel sounds normal; no masses,  no organomegaly Extremities: edema trace Wound: clean and dry, aquacel on sternotomy  Lab Results: CBC: Recent Labs    02/05/20 1830 02/05/20 1830 02/05/20 1833 02/06/20 0419  WBC 15.6*  --   --  10.1  HGB 11.1*   < > 10.5* 10.5*  HCT 33.3*   < > 31.0* 31.6*  PLT 240  --   --  245   < > = values in this interval not displayed.   BMET:  Recent Labs    02/05/20 1830 02/05/20 1830 02/05/20 1833 02/06/20 0419  NA 137   < > 139 133*  K 4.3   < > 4.3 4.1  CL 109  --   --  107  CO2 20*  --   --  19*  GLUCOSE 138*  --   --  129*  BUN 15  --   --  17  CREATININE 0.89  --   --  0.85  CALCIUM 7.4*  --   --  7.4*   < > = values in this interval not displayed.    CMET: Lab Results  Component Value Date   WBC 10.1 02/06/2020   HGB 10.5 (L) 02/06/2020   HCT 31.6 (L)  02/06/2020   PLT 245 02/06/2020   GLUCOSE 129 (H) 02/06/2020   CHOL 206 (H) 09/20/2019   TRIG 135 09/20/2019   HDL 33 (L) 09/20/2019   LDLDIRECT 152.5 10/05/2010   LDLCALC 148 (H) 09/20/2019   ALT 16 02/05/2020   AST 24 02/05/2020   NA 133 (L) 02/06/2020   K 4.1 02/06/2020   CL 107 02/06/2020   CREATININE 0.85 02/06/2020   BUN 17 02/06/2020   CO2 19 (L) 02/06/2020   TSH 2.514 07/11/2010   INR 1.4 (H) 02/05/2020   HGBA1C 5.6 02/01/2020      PT/INR:  Recent Labs    02/05/20 1324  LABPROT 16.4*  INR 1.4*   Radiology: [facility] Chest Port 1 View  Result Date: 02/05/2020 CLINICAL DATA:  Status post CABG today. EXAM: PORTABLE CHEST 1 VIEW COMPARISON:  PA and lateral chest 02/01/2020. FINDINGS: Endotracheal tube is in place with its tip approximately 4 cm above the carina. Right IJ approach Swan-Ganz catheter tip is in the proximal right main pulmonary artery. NG tube courses into the stomach and below the inferior margin of the film. Left chest tube is noted. There is mild subsegmental atelectasis in the lung bases. No pulmonary edema or consolidative process. No pneumothorax. Heart size normal. IMPRESSION: Support tubes and lines projecting good position. Negative for pneumothorax or acute disease. Electronically Signed   By: Inge Rise M.D.   On: 02/05/2020 13:40    Assessment/Plan: S/P Procedure(s) (LRB): CORONARY ARTERY BYPASS GRAFTING (CABG), ON PUMP, TIMES THREE, USING LEFT INTERNAL MAMMARY ARTERY TO LEFT ANTERIOR DESCENDING ARTERY, REVERSE SAPHENOUS VEIN GRAFT TO POSTERIOR DESCENDING ARTERY AND CIRCUMFLEX ARTERY (N/A) TRANSESOPHAGEAL ECHOCARDIOGRAM (TEE) (N/A) ENDOVEIN HARVEST OF GREATER SAPHENOUS VEIN (Right)  1. CV- NSR, on NEO-- will wean as tolerated 2. Pulm- CT output 450 cc since surgery, CXR is free from pleural effusions + atelectasis, d/c chest tubes today, IS 3. Renal- creatinine WNL, K is okay.. hold off on diuretics until off Neo 4. Expected post operative  blood loss anemia, Mild Hgb at 10.5 5. CBGs controlled, not a diabetic, stop insulin drip start SSIP 6. dispo- patient stable, nausea resolved, maintaining NSR, weaning Neo as tolerated, POD #1 progression orders  Ellwood Handler, PA-C  02/06/2020 7:14 AM   Stable this am Progress ambulation as tolerated Expected Acute  Blood - loss Anemia- continue to monitor - no transfusion given

## 2020-02-07 ENCOUNTER — Inpatient Hospital Stay (HOSPITAL_COMMUNITY): Payer: Medicare HMO

## 2020-02-07 LAB — BASIC METABOLIC PANEL
Anion gap: 9 (ref 5–15)
BUN: 19 mg/dL (ref 8–23)
CO2: 21 mmol/L — ABNORMAL LOW (ref 22–32)
Calcium: 7.9 mg/dL — ABNORMAL LOW (ref 8.9–10.3)
Chloride: 104 mmol/L (ref 98–111)
Creatinine, Ser: 1.03 mg/dL (ref 0.61–1.24)
GFR calc Af Amer: >60 mL/min (ref >60)
GFR calc non Af Amer: >60 mL/min (ref >60)
Glucose, Bld: 111 mg/dL — ABNORMAL HIGH (ref 70–99)
Potassium: 5 mmol/L (ref 3.5–5.1)
Sodium: 134 mmol/L — ABNORMAL LOW (ref 135–145)

## 2020-02-07 LAB — CBC
HCT: 31.9 % — ABNORMAL LOW (ref 39.0–52.0)
Hemoglobin: 10.6 g/dL — ABNORMAL LOW (ref 13.0–17.0)
MCH: 31.4 pg (ref 26.0–34.0)
MCHC: 33.2 g/dL (ref 30.0–36.0)
MCV: 94.4 fL (ref 80.0–100.0)
Platelets: 208 10*3/uL (ref 150–400)
RBC: 3.38 MIL/uL — ABNORMAL LOW (ref 4.22–5.81)
RDW: 12.9 % (ref 11.5–15.5)
WBC: 11.9 10*3/uL — ABNORMAL HIGH (ref 4.0–10.5)
nRBC: 0 % (ref 0.0–0.2)

## 2020-02-07 LAB — GLUCOSE, CAPILLARY
Glucose-Capillary: 107 mg/dL — ABNORMAL HIGH (ref 70–99)
Glucose-Capillary: 108 mg/dL — ABNORMAL HIGH (ref 70–99)
Glucose-Capillary: 121 mg/dL — ABNORMAL HIGH (ref 70–99)
Glucose-Capillary: 128 mg/dL — ABNORMAL HIGH (ref 70–99)
Glucose-Capillary: 142 mg/dL — ABNORMAL HIGH (ref 70–99)
Glucose-Capillary: 146 mg/dL — ABNORMAL HIGH (ref 70–99)
Glucose-Capillary: 155 mg/dL — ABNORMAL HIGH (ref 70–99)

## 2020-02-07 MED ORDER — ATORVASTATIN CALCIUM 10 MG PO TABS
20.0000 mg | ORAL_TABLET | Freq: Every day | ORAL | Status: DC
  Administered 2020-02-07: 20 mg via ORAL
  Filled 2020-02-07: qty 2

## 2020-02-07 MED ORDER — AMIODARONE LOAD VIA INFUSION
150.0000 mg | Freq: Once | INTRAVENOUS | Status: AC
  Administered 2020-02-07: 150 mg via INTRAVENOUS
  Filled 2020-02-07: qty 83.34

## 2020-02-07 MED ORDER — SODIUM CHLORIDE 0.9% FLUSH
3.0000 mL | INTRAVENOUS | Status: DC | PRN
  Administered 2020-02-09: 3 mL via INTRAVENOUS

## 2020-02-07 MED ORDER — SODIUM CHLORIDE 0.9% FLUSH
3.0000 mL | Freq: Two times a day (BID) | INTRAVENOUS | Status: DC
  Administered 2020-02-07 – 2020-02-11 (×9): 3 mL via INTRAVENOUS

## 2020-02-07 MED ORDER — SODIUM CHLORIDE 0.9 % IV SOLN: 250.0000 mL | INTRAVENOUS | Status: DC | PRN

## 2020-02-07 MED ORDER — AMIODARONE HCL 200 MG PO TABS
200.0000 mg | ORAL_TABLET | Freq: Two times a day (BID) | ORAL | Status: DC
  Administered 2020-02-08 – 2020-02-09 (×4): 200 mg via ORAL
  Filled 2020-02-07 (×5): qty 1

## 2020-02-07 MED ORDER — AMIODARONE HCL IN DEXTROSE 360-4.14 MG/200ML-% IV SOLN
60.0000 mg/h | INTRAVENOUS | Status: AC
  Administered 2020-02-07 (×2): 60 mg/h via INTRAVENOUS
  Filled 2020-02-07: qty 200

## 2020-02-07 MED ORDER — AMIODARONE HCL IN DEXTROSE 360-4.14 MG/200ML-% IV SOLN
30.0000 mg/h | INTRAVENOUS | Status: DC
  Administered 2020-02-07 – 2020-02-08 (×2): 30 mg/h via INTRAVENOUS
  Filled 2020-02-07: qty 200

## 2020-02-07 MED ORDER — GUAIFENESIN ER 600 MG PO TB12
1200.0000 mg | ORAL_TABLET | Freq: Two times a day (BID) | ORAL | Status: DC
  Administered 2020-02-07 – 2020-02-12 (×11): 1200 mg via ORAL
  Filled 2020-02-07 (×11): qty 2

## 2020-02-07 MED ORDER — AMIODARONE HCL 200 MG PO TABS: 200.0000 mg | ORAL_TABLET | Freq: Every day | ORAL | Status: DC

## 2020-02-07 MED ORDER — ~~LOC~~ CARDIAC SURGERY, PATIENT & FAMILY EDUCATION: Freq: Once | Status: AC

## 2020-02-07 MED ORDER — ACETAMINOPHEN 500 MG PO TABS
1000.0000 mg | ORAL_TABLET | Freq: Four times a day (QID) | ORAL | 0 refills | Status: DC | PRN
Start: 2020-02-07 — End: 2020-03-28

## 2020-02-07 MED ORDER — FUROSEMIDE 10 MG/ML IJ SOLN
20.0000 mg | Freq: Once | INTRAMUSCULAR | Status: AC
  Administered 2020-02-07: 20 mg via INTRAVENOUS
  Filled 2020-02-07: qty 2

## 2020-02-07 MED ORDER — GUAIFENESIN ER 600 MG PO TB12: 1200.0000 mg | ORAL_TABLET | Freq: Two times a day (BID) | ORAL | Status: DC

## 2020-02-07 MED ORDER — FUROSEMIDE 10 MG/ML IJ SOLN
40.0000 mg | Freq: Once | INTRAMUSCULAR | Status: AC
  Administered 2020-02-07: 40 mg via INTRAVENOUS
  Filled 2020-02-07: qty 4

## 2020-02-07 NOTE — Progress Notes (Signed)
CARDIAC REHAB PHASE I   PRE:  Rate/Rhythm: 92 SR  BP:  Sitting: 121/80      SaO2: 95 2L  MODE:  Ambulation: 275 ft   POST:  Rate/Rhythm: 114 ST  BP:  Sitting: 114/76    SaO2: 96 3L  Pt ambulated 250ft in hallway standby assist with EVA. Pt returned to recliner, attempted to cough some mucus up. Encouraged continued ambulation and IS use. Pt currently demonstrating 1225 on IS. Requested Flutter valve from RN. Will continue to follow.  [phone] Rufina Falco, RN BSN 02/07/2020 11:16 AM

## 2020-02-07 NOTE — Progress Notes (Signed)
Spoke to patient and wife regarding statin/PCSK9inh use. Explained to patient his LDL level and that we want him to be < 70 or even < 55 given CABG. Patient does not want to take any statin or PCKS9inh due to friend who had DM and stroke which he attributes to statin use.  Also suggested Zetia which patient was not interested in. Told him I would relay this to MD so everyone was on the same page. He is adamant about not using any cholesterol meds.   Dimple Nanas, PharmD PGY-1 Acute Care Pharmacy Resident 02/07/2020 2:44 PM

## 2020-02-07 NOTE — Progress Notes (Signed)
EVENING ROUNDS NOTE :     [address]       [place] [address]             [phone]                 2 Days Post-Op Procedure(s) (LRB): CORONARY ARTERY BYPASS GRAFTING (CABG), ON PUMP, TIMES THREE, USING LEFT INTERNAL MAMMARY ARTERY TO LEFT ANTERIOR DESCENDING ARTERY, REVERSE SAPHENOUS VEIN GRAFT TO POSTERIOR DESCENDING ARTERY AND CIRCUMFLEX ARTERY (N/A) TRANSESOPHAGEAL ECHOCARDIOGRAM (TEE) (N/A) ENDOVEIN HARVEST OF GREATER SAPHENOUS VEIN (Right)   Total Length of Stay:  LOS: 2 days  Events:   No events Awaiting transfer    BP 110/88 (BP Location: Left Arm)   Pulse (!) 119   Temp 98.1 F (36.7 C) (Oral)   Resp (!) 27   Ht 5\' 7"  (1.702 m)   Wt 96.1 kg   SpO2 92%   BMI 33.18 kg/m         . sodium chloride Stopped (02/05/20 1949)  . sodium chloride    . sodium chloride 20 mL/hr at 02/06/20 0400  . sodium chloride    . amiodarone 60 mg/hr (02/07/20 1600)   Followed by  . amiodarone    . lactated ringers    . lactated ringers Stopped (02/05/20 1315)  . lactated ringers Stopped (02/07/20 0445)    I/O last 3 completed shifts: In: 1310.8 [P.O.:580; I.V.:361; IV Piggyback:369.9] Out: 2180 [Urine:1880; Chest Tube:300]   CBC Latest Ref Rng & Units 02/07/2020 02/06/2020 02/06/2020  WBC 4.0 - 10.5 K/uL 11.9(H) 12.2(H) 10.1  Hemoglobin 13.0 - 17.0 g/dL 10.6(L) 10.8(L) 10.5(L)  Hematocrit 39 - 52 % 31.9(L) 32.4(L) 31.6(L)  Platelets 150 - 400 K/uL 208 227 245    BMP Latest Ref Rng & Units 02/07/2020 02/06/2020 02/06/2020  Glucose 70 - 99 mg/dL 111(H) 138(H) 129(H)  BUN 8 - 23 mg/dL 19 20 17   Creatinine 0.61 - 1.24 mg/dL 1.03 0.98 0.85  BUN/Creat Ratio 10 - 24 - - -  Sodium 135 - 145 mmol/L 134(L) 131(L) 133(L)  Potassium 3.5 - 5.1 mmol/L 5.0 3.8 4.1  Chloride 98 - 111 mmol/L 104 103 107  CO2 22 - 32 mmol/L 21(L) 20(L) 19(L)  Calcium 8.9 - 10.3 mg/dL 7.9(L) 7.9(L) 7.4(L)    ABG    Component Value Date/Time   PHART 7.405 02/05/2020 1833    PCO2ART 30.5 (L) 02/05/2020 1833   PO2ART 89 02/05/2020 1833   HCO3 19.2 (L) 02/05/2020 1833   TCO2 20 (L) 02/05/2020 1833   ACIDBASEDEF 5.0 (H) 02/05/2020 1833   O2SAT 97.0 02/05/2020 1833       Melodie Bouillon, MD 02/07/2020 6:16 PM

## 2020-02-07 NOTE — Progress Notes (Addendum)
[facility] DAILY ICU PROGRESS NOTE                   [address]            [place] [address]          [phone]   2 Days Post-Op Procedure(s) (LRB): CORONARY ARTERY BYPASS GRAFTING (CABG), ON PUMP, TIMES THREE, USING LEFT INTERNAL MAMMARY ARTERY TO LEFT ANTERIOR DESCENDING ARTERY, REVERSE SAPHENOUS VEIN GRAFT TO POSTERIOR DESCENDING ARTERY AND CIRCUMFLEX ARTERY (N/A) TRANSESOPHAGEAL ECHOCARDIOGRAM (TEE) (N/A) ENDOVEIN HARVEST OF GREATER SAPHENOUS VEIN (Right)  Total Length of Stay:  LOS: 2 days   Subjective:  Up in chair.  Denies nausea.  Having some difficulty coughing up sputum.  Objective: Vital signs in last 24 hours: Temp:  [97.8 F (36.6 C)-99.1 F (37.3 C)] 98.1 F (36.7 C) (08/26 0640) Pulse Rate:  [63-104] 90 (08/26 0500) Cardiac Rhythm: Normal sinus rhythm (08/25 2000) Resp:  [20-26] 24 (08/26 0500) BP: (91-117)/(46-89) 106/78 (08/26 0500) SpO2:  [92 %-99 %] 98 % (08/26 0500) Weight:  [96.1 kg] 96.1 kg (08/26 0500)  Filed Weights   02/05/20 0607 02/06/20 0500 02/07/20 0500  Weight: 93.4 kg 95.3 kg 96.1 kg    Weight change: 0.845 kg   Intake/Output from previous day: 08/25 0701 - 08/26 0700 In: 650 [P.O.:580; IV Piggyback:70] Out: 9485 [Urine:1375; Chest Tube:130]  Current Meds: Scheduled Meds: . acetaminophen  1,000 mg Oral Q6H   Or  . acetaminophen (TYLENOL) oral liquid 160 mg/5 mL  1,000 mg Per Tube Q6H  . aspirin EC  325 mg Oral Daily   Or  . aspirin  324 mg Per Tube Daily  . bisacodyl  10 mg Oral Daily   Or  . bisacodyl  10 mg Rectal Daily  . Chlorhexidine Gluconate Cloth  6 each Topical Daily  . [facility], Patient & Family Education   Does not apply Once  . docusate sodium  200 mg Oral Daily  . enoxaparin (LOVENOX) injection  40 mg Subcutaneous QHS  . furosemide  20 mg Intravenous Once  . guaiFENesin  1,200 mg Oral BID  . insulin aspart  0-24 Units Subcutaneous Q4H  . insulin aspart  0-24 Units Subcutaneous  Q4H  . metoprolol tartrate  12.5 mg Oral BID   Or  . metoprolol tartrate  12.5 mg Per Tube BID  . pantoprazole  40 mg Oral Daily  . sodium chloride flush  3 mL Intravenous Q12H  . sodium chloride flush  3 mL Intravenous Q12H   Continuous Infusions: . sodium chloride Stopped (02/05/20 1949)  . sodium chloride    . sodium chloride 20 mL/hr at 02/06/20 0400  . sodium chloride    . lactated ringers    . lactated ringers Stopped (02/05/20 1315)  . lactated ringers 20 mL/hr at 02/05/20 1315  . phenylephrine (NEO-SYNEPHRINE) Adult infusion 20 mcg/min (02/05/20 1449)   PRN Meds:.sodium chloride, sodium chloride, fentaNYL (SUBLIMAZE) injection, lactated ringers, metoprolol tartrate, midazolam, ondansetron (ZOFRAN) IV, oxyCODONE, sodium chloride flush, sodium chloride flush, traMADol  General appearance: alert, cooperative and no distress Heart: regular rate and rhythm Lungs: coarse throughout Abdomen: soft, non-tender; bowel sounds normal; no masses,  no organomegaly Extremities: edema trace Wound: aquacel on sternotomy, EVH C/D/I  Lab Results: CBC: Recent Labs    02/06/20 1625 02/07/20 0536  WBC 12.2* 11.9*  HGB 10.8* 10.6*  HCT 32.4* 31.9*  PLT 227 208   BMET:  Recent Labs  02/06/20 1625 02/07/20 0536  NA 131* 134*  K 3.8 5.0  CL 103 104  CO2 20* 21*  GLUCOSE 138* 111*  BUN 20 19  CREATININE 0.98 1.03  CALCIUM 7.9* 7.9*    CMET: Lab Results  Component Value Date   WBC 11.9 (H) 02/07/2020   HGB 10.6 (L) 02/07/2020   HCT 31.9 (L) 02/07/2020   PLT 208 02/07/2020   GLUCOSE 111 (H) 02/07/2020   CHOL 206 (H) 09/20/2019   TRIG 135 09/20/2019   HDL 33 (L) 09/20/2019   LDLDIRECT 152.5 10/05/2010   LDLCALC 148 (H) 09/20/2019   ALT 16 02/05/2020   AST 24 02/05/2020   NA 134 (L) 02/07/2020   K 5.0 02/07/2020   CL 104 02/07/2020   CREATININE 1.03 02/07/2020   BUN 19 02/07/2020   CO2 21 (L) 02/07/2020   TSH 2.514 07/11/2010   INR 1.4 (H) 02/05/2020   HGBA1C 5.6  02/01/2020      PT/INR:  Recent Labs    02/05/20 1324  LABPROT 16.4*  INR 1.4*   Radiology: No results found.   Assessment/Plan: S/P Procedure(s) (LRB): CORONARY ARTERY BYPASS GRAFTING (CABG), ON PUMP, TIMES THREE, USING LEFT INTERNAL MAMMARY ARTERY TO LEFT ANTERIOR DESCENDING ARTERY, REVERSE SAPHENOUS VEIN GRAFT TO POSTERIOR DESCENDING ARTERY AND CIRCUMFLEX ARTERY (N/A) TRANSESOPHAGEAL ECHOCARDIOGRAM (TEE) (N/A) ENDOVEIN HARVEST OF GREATER SAPHENOUS VEIN (Right)  1. CV- NSR, BP stable- continue low dose Lopressor 2. Pulm- CXR with some atelectasis... congestion, will add Mucinex to help clear secretions, continue IS 3. Renal- creatinine WNL, weight is elevated, + edema... K is at 5.0.Marland KitchenMarland Kitchen will give IV Lasix at 20 mg today 4. Expected post operative blood loss anemia, mild at 10.6 5. CBGs- controlled, patient is not a diabetic 6. Dispo- patient stable, maintaining NSR, will add Mucinex to help with secretions, start gentle diuretics, transfer to [place], PA-C' 02/07/2020 7:09 AM   Walked in hall this am Lasix today Renal function stable To 4e today I have seen and examined Jeffery Turner and agree with the above assessment  and plan.  Grace Isaac MD Beeper [phone] Office [phone] 02/07/2020 7:56 AM

## 2020-02-07 NOTE — Progress Notes (Signed)
°  Amiodarone Drug - Drug Interaction Consult Note  Recommendations: - Monitor for bradycardia with concurrent metoprolol - Monitor for and attempt to avoid hypokalemia with concurrent diuretic use  Amiodarone is metabolized by the cytochrome P450 system and therefore has the potential to cause many drug interactions. Amiodarone has an average plasma half-life of 50 days (range 20 to 100 days).   There is potential for drug interactions to occur several weeks or months after stopping treatment and the onset of drug interactions may be slow after initiating amiodarone.   []  Statins: Increased risk of myopathy. Simvastatin- restrict dose to 20mg  daily. Other statins: counsel patients to report any muscle pain or weakness immediately.  []  Anticoagulants: Amiodarone can increase anticoagulant effect. Consider warfarin dose reduction. Patients should be monitored closely and the dose of anticoagulant altered accordingly, remembering that amiodarone levels take several weeks to stabilize.  []  Antiepileptics: Amiodarone can increase plasma concentration of phenytoin, the dose should be reduced. Note that small changes in phenytoin dose can result in large changes in levels. Monitor patient and counsel on signs of toxicity.  [x]  Beta blockers: increased risk of bradycardia, AV block and myocardial depression. Sotalol - avoid concomitant use.  []   Calcium channel blockers (diltiazem and verapamil): increased risk of bradycardia, AV block and myocardial depression.  []   Cyclosporine: Amiodarone increases levels of cyclosporine. Reduced dose of cyclosporine is recommended.  []  Digoxin dose should be halved when amiodarone is started.  [x]  Diuretics: increased risk of cardiotoxicity if hypokalemia occurs.  []  Oral hypoglycemic agents (glyburide, glipizide, glimepiride): increased risk of hypoglycemia. Patient's glucose levels should be monitored closely when initiating amiodarone therapy.   []  Drugs  that prolong the QT interval:  Torsades de pointes risk may be increased with concurrent use - avoid if possible.  Monitor QTc, also keep magnesium/potassium WNL if concurrent therapy can't be avoided.  Antibiotics: e.g. fluoroquinolones, erythromycin.  Antiarrhythmics: e.g. quinidine, procainamide, disopyramide, sotalol.  Antipsychotics: e.g. phenothiazines, haloperidol.   Lithium, tricyclic antidepressants, and methadone. Thank You,   Thank you for allowing pharmacy to be a part of this patients care.  Alycia Rossetti, PharmD, BCPS Clinical Pharmacist 02/07/2020 4:09 PM

## 2020-02-07 NOTE — Discharge Summary (Signed)
[address]       [place] [address]             [phone]    Physician Discharge Summary  Patient ID: Jeffery Turner MRN: [id number] DOB/AGE: March 07, 1941 79 y.o.  Admit date: 02/05/2020 Discharge date: 02/12/2020  Admission Diagnoses:  Patient Active Problem List   Diagnosis Date Noted  . Anginal equivalent ([place]) 09/19/2019  . Angina, class III ([place]) 02/06/2018  . Mixed hyperlipidemia 07/21/2010  . UNSTABLE ANGINA 07/21/2010  . OLD MYOCARDIAL INFARCTION 07/21/2010  . Coronary artery disease of native artery of native heart with stable angina pectoris ([place]) 07/21/2010  . OSTEOARTHRITIS 07/21/2010  . EPISTAXIS 07/21/2010  . SHORTNESS OF BREATH 07/21/2010  . TOBACCO USE, QUIT 07/21/2010   Discharge Diagnoses:   Patient Active Problem List   Diagnosis Date Noted  . 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 Anginal equivalent ([place]) 09/19/2019  . Angina, class III ([place]) 02/06/2018  . Mixed hyperlipidemia 07/21/2010  . UNSTABLE ANGINA 07/21/2010  . OLD MYOCARDIAL INFARCTION 07/21/2010  . Coronary artery disease of native artery of native heart with stable angina pectoris ([place]) 07/21/2010  . OSTEOARTHRITIS 07/21/2010  . EPISTAXIS 07/21/2010  . SHORTNESS OF BREATH 07/21/2010  . TOBACCO USE, QUIT 07/21/2010   Discharged Condition: good  History of Present Illness:  Jeffery Turner is a 79 yo male referred to [facility] for evaluation of CAD.  The patient has known history of nicotine abuse quitting in 2001 and CAD dating back to 1996.  At that time he presented with a Myocardial Infarction with stenting of Left Circumflex artery.  The patient developed recurrent angina symptoms since 2012 which resulted in stent placement to the LAD and RCA at that time.  Over the past several months the patient has had increasing symptoms of chest tightness and shortness of breath with exertion.  The symptoms do not occur with rest.  He was evaluated by his Cardiologist who felt cardiac  catheterization.  This was performed on 01/25/2020 and he was found to have multivessel CAD with preserved 50-55%.  It was felt coronary bypass grafting would be indicated.  He was evaluated by Dr. Servando Snare, who was in agreement the patient would benefit from coronary bypass grafting procedure.  The risks and benefits of the procedure were explained to the patient and he was agreeable to proceed.  Hospital Course:  Jeffery Turner presented to [facility] on 02/05/2020.  He was taken to the operating room and underwent CABG x 3 utilizing LIMA to LAD, SVG to Circumflex, and SVG to PDA.  He also underwent endoscopic harvest of greater saphenous vein from his right leg.  He tolerated the procedure without difficulty and was taken to the SICU in stable condition.  The patient was extubated the evening of surgery.  During his stay in the SICU the patient was weaned off Neo-synephrine as tolerated.  He had some nausea which was relieved with Reglan.  His chest tubes and arterial lines were removed without difficulty.  He developed Atrial Fibrillation. He was treated with IV Amiodarone with successful conversion to NSR.  He was transferred to the progressive care unit on 02/08/2020.  He continued to make slow progress.  He continued to have episodes of PAF.  He was placed on oral Amiodarone.  He developed some mild respiratory distress.  He was treated with Xopenex and Mucinex was added to improve his cough.  He was started on iron therapy for  expected post operative blood loss anemia.  His pacing wires were removed without difficulty.  His incisions are healing without evidence of infection.  He is ambulating with minimal assistance.  He is medically stable for discharge home today.        Significant Diagnostic Studies: angiography:    Severe multivessel CAD including LEFT MAIN:  Ost LM lesion is 60% stenosed.  Mid LAD stent has diffuse 70% in-stent restenosis  -- Jailed 1st Diag-1 lesion is 50% stenosed.  1st Diag-2 lesion is 45% stenosed.  Prox Cx lesion is 40% stenosed.  Prox Cx to Mid Cx lesion is 75% stenosed leading up to prior stent.  Mid Cx to Dist Cx stent is 80% stenosed.  Dist Cx lesion is 70% stenosed with 50% stenosed side branch in 4th Mrg.  Prox RCA lesion is 65% stenosed.  Prox RCA to Mid RCA stent has diffuse 70% in-stent restenosis  Dist RCA lesion is 50% stenosed with 50% stenosed side branch in RPDA. RPDA lesion is 100% stenosed.  -------------  The left ventricular systolic function is normal. The left ventricular ejection fraction is 50-55% by visual estimate --anterolateral as well as basal to mid inferior hypokinesis.  LV end diastolic pressure is normal.  Treatments: surgery:   SURGICAL PROCEDURE:  Coronary artery bypass grafting x3 with the left internal mammary to the left anterior descending coronary artery, reverse saphenous vein graft to the circumflex coronary artery, reverse saphenous vein graft to the proximal posterior  descending coronary artery with right greater saphenous thigh and calf endoscopic vein harvesting.  Discharge Exam: Blood pressure 125/71, pulse 70, temperature 97.7 F (36.5 C), temperature source Oral, resp. rate 19, height 5\' 7"  (1.702 m), weight 92.1 kg, SpO2 95 %.  General appearance: alert, cooperative and no distress Heart: regular rate and rhythm Lungs: coarse clears with cough Abdomen: soft, non-tender; bowel sounds normal; no masses,  no organomegaly Extremities: edema trace Wound: clean and dry, extensive ecchymosis RLE  Discharge Medications:  The patient has been discharged on:   1.Beta Blocker:  Yes [ X  ]                              No   [   ]                              If No, reason:  2.Ace Inhibitor/ARB: Yes [   ]                                     No  [ x   ]                                     If No, reason: labile BP  3.Statin:   Yes [ X  ]                  No  [   ]                  If No,  reason:  4.Ecasa:  Yes  [ X ]                  No   [   ]  If No, reason:    Discharge Instructions    Amb Referral to Cardiac Rehabilitation   Complete by: As directed    Will send Cardiac Rehab Phase 2 referral to [facility]   Diagnosis: CABG   CABG X ___: 3   After initial evaluation and assessments completed: Virtual Based Care may be provided alone or in conjunction with Phase 2 Cardiac Rehab based on patient barriers.: Yes     Allergies as of 02/12/2020      Reactions   Codeine Nausea And Vomiting   Statins Other (See Comments)   Patient preference to not take statins/cholesterol meds      Medication List    STOP taking these medications   bisoprolol 5 MG tablet Commonly known as: ZEBETA   isosorbide mononitrate 30 MG 24 hr tablet Commonly known as: IMDUR     TAKE these medications   acetaminophen 500 MG tablet Commonly known as: TYLENOL Take 2 tablets (1,000 mg total) by mouth every 6 (six) hours as needed.   amiodarone 200 MG tablet Commonly known as: PACERONE Take 2 tablets (400 mg total) by mouth 2 (two) times daily. X 7 days, then decrease to 200 mg BID x 7 days, then decrease to 200 mg daily   aspirin 81 MG EC tablet Commonly known as: Aspirin 81 Take 1 tablet (81 mg total) by mouth daily. Swallow whole. What changed: when to take this   CAL-MAG-ZINC PO Take 1 tablet by mouth daily.   COCONUT OIL PO Take 30 mLs by mouth daily.   COSAMIN DS PO Take 1 tablet by mouth daily.   ferrous sulfate 325 (65 FE) MG tablet Take 325 mg by mouth once a week.   furosemide 40 MG tablet Commonly known as: LASIX Take 1 tablet (40 mg total) by mouth daily.   guaiFENesin 600 MG 12 hr tablet Commonly known as: MUCINEX Take 2 tablets (1,200 mg total) by mouth 2 (two) times daily.   metoprolol tartrate 25 MG tablet Commonly known as: LOPRESSOR Take 1 tablet (25 mg total) by mouth 2 (two) times daily.   mometasone-formoterol 100-5 MCG/ACT  Aero Commonly known as: DULERA Inhale 2 puffs into the lungs 2 (two) times daily.   multivitamin with minerals Tabs tablet Take 1 tablet by mouth daily.   NATTOKINASE PO Take 1 capsule by mouth daily.   nitroGLYCERIN 0.4 MG SL tablet Commonly known as: NITROSTAT Place 1 tablet (0.4 mg total) under the tongue every 5 (five) minutes as needed. If require 2, recommend call 911. What changed: reasons to take this   OVER THE COUNTER MEDICATION Place 1 drop into both eyes 3 (three) times daily as needed (eye irritation.). Colloidal Silver Eye Drops   potassium chloride SA 20 MEQ tablet Commonly known as: KLOR-CON Take 1 tablet (20 mEq total) by mouth daily.   rosuvastatin 10 MG tablet Commonly known as: CRESTOR Take 1 tablet (10 mg total) by mouth daily.   traMADol 50 MG tablet Commonly known as: ULTRAM Take 1 tablet (50 mg total) by mouth every 4 (four) hours as needed for moderate pain.   TURMERIC PO Take 1 capsule by mouth 3 (three) times daily as needed (inflammation).       Follow-up Information    Grace Isaac, MD Follow up on 03/06/2020.   Specialty: Cardiothoracic Surgery Why: Appointment is at 12:00, please get CXR at 11:30 at [facility] located on first floor of our office building Contact information: [place]  [address] [place] [address] [phone]        Richardo Priest, MD Follow up on 02/14/2020.   Specialty: Cardiology Why: Appointment is at 9:40 Contact information: [address] [place] [address] [phone]        Rochel Brome, MD. Call.   Specialties: Family Medicine, Interventional Cardiology, Radiology, Anesthesiology Why: for follow up regarding TSH 5.401 Contact information: [address] [place] [address] [phone]               Signed: Ellwood Handler, PA-C 02/12/2020, 7:19 AM

## 2020-02-08 ENCOUNTER — Inpatient Hospital Stay (HOSPITAL_COMMUNITY): Payer: Medicare HMO

## 2020-02-08 LAB — CBC
HCT: 33.3 % — ABNORMAL LOW (ref 39.0–52.0)
Hemoglobin: 10.8 g/dL — ABNORMAL LOW (ref 13.0–17.0)
MCH: 30.5 pg (ref 26.0–34.0)
MCHC: 32.4 g/dL (ref 30.0–36.0)
MCV: 94.1 fL (ref 80.0–100.0)
Platelets: 227 10*3/uL (ref 150–400)
RBC: 3.54 MIL/uL — ABNORMAL LOW (ref 4.22–5.81)
RDW: 12.8 % (ref 11.5–15.5)
WBC: 10.1 10*3/uL (ref 4.0–10.5)
nRBC: 0 % (ref 0.0–0.2)

## 2020-02-08 LAB — BASIC METABOLIC PANEL
Anion gap: 11 (ref 5–15)
BUN: 16 mg/dL (ref 8–23)
CO2: 21 mmol/L — ABNORMAL LOW (ref 22–32)
Calcium: 8.2 mg/dL — ABNORMAL LOW (ref 8.9–10.3)
Chloride: 101 mmol/L (ref 98–111)
Creatinine, Ser: 0.93 mg/dL (ref 0.61–1.24)
GFR calc Af Amer: >60 mL/min (ref >60)
GFR calc non Af Amer: >60 mL/min (ref >60)
Glucose, Bld: 133 mg/dL — ABNORMAL HIGH (ref 70–99)
Potassium: 3.7 mmol/L (ref 3.5–5.1)
Sodium: 133 mmol/L — ABNORMAL LOW (ref 135–145)

## 2020-02-08 LAB — GLUCOSE, CAPILLARY
Glucose-Capillary: 110 mg/dL — ABNORMAL HIGH (ref 70–99)
Glucose-Capillary: 115 mg/dL — ABNORMAL HIGH (ref 70–99)
Glucose-Capillary: 122 mg/dL — ABNORMAL HIGH (ref 70–99)
Glucose-Capillary: 126 mg/dL — ABNORMAL HIGH (ref 70–99)
Glucose-Capillary: 130 mg/dL — ABNORMAL HIGH (ref 70–99)

## 2020-02-08 LAB — MAGNESIUM: Magnesium: 1.8 mg/dL (ref 1.7–2.4)

## 2020-02-08 LAB — TSH: TSH: 5.401 u[IU]/mL — ABNORMAL HIGH (ref 0.350–4.500)

## 2020-02-08 MED ORDER — LEVALBUTEROL HCL 0.63 MG/3ML IN NEBU
0.6300 mg | INHALATION_SOLUTION | Freq: Three times a day (TID) | RESPIRATORY_TRACT | Status: DC
  Administered 2020-02-09: 0.63 mg via RESPIRATORY_TRACT
  Filled 2020-02-08: qty 3

## 2020-02-08 MED ORDER — LEVALBUTEROL HCL 0.63 MG/3ML IN NEBU
0.6300 mg | INHALATION_SOLUTION | Freq: Four times a day (QID) | RESPIRATORY_TRACT | Status: DC
  Administered 2020-02-08 (×3): 0.63 mg via RESPIRATORY_TRACT
  Filled 2020-02-08 (×3): qty 3

## 2020-02-08 MED ORDER — ROSUVASTATIN CALCIUM 5 MG PO TABS
10.0000 mg | ORAL_TABLET | Freq: Every day | ORAL | Status: DC
  Administered 2020-02-08 – 2020-02-12 (×5): 10 mg via ORAL
  Filled 2020-02-08 (×5): qty 2

## 2020-02-08 MED ORDER — FUROSEMIDE 10 MG/ML IJ SOLN
40.0000 mg | Freq: Once | INTRAMUSCULAR | Status: AC
  Administered 2020-02-08: 40 mg via INTRAVENOUS
  Filled 2020-02-08: qty 4

## 2020-02-08 MED ORDER — POTASSIUM CHLORIDE CRYS ER 20 MEQ PO TBCR
20.0000 meq | EXTENDED_RELEASE_TABLET | ORAL | Status: AC
  Administered 2020-02-08 (×3): 20 meq via ORAL
  Filled 2020-02-08 (×3): qty 1

## 2020-02-08 NOTE — Progress Notes (Signed)
Attempted report x1. 

## 2020-02-08 NOTE — Progress Notes (Signed)
EVENING ROUNDS NOTE :     [address]       [place] [address]             [phone]                 3 Days Post-Op Procedure(s) (LRB): CORONARY ARTERY BYPASS GRAFTING (CABG), ON PUMP, TIMES THREE, USING LEFT INTERNAL MAMMARY ARTERY TO LEFT ANTERIOR DESCENDING ARTERY, REVERSE SAPHENOUS VEIN GRAFT TO POSTERIOR DESCENDING ARTERY AND CIRCUMFLEX ARTERY (N/A) TRANSESOPHAGEAL ECHOCARDIOGRAM (TEE) (N/A) ENDOVEIN HARVEST OF GREATER SAPHENOUS VEIN (Right)  Total Length of Stay:  LOS: 3 days  BP 138/88   Pulse 90   Temp 98.5 F (36.9 C) (Oral)   Resp (!) 26   Ht 5\' 7"  (1.702 m)   Wt 88.7 kg   SpO2 100%   BMI 30.63 kg/m   .Intake/Output      08/27 0701 - 08/28 0700   P.O.    I.V. (mL/kg) 7.5 (0.1)   Total Intake(mL/kg) 7.5 (0.1)   Urine (mL/kg/hr) 1025 (1)   Total Output 1025   Net -1017.6         . sodium chloride Stopped (02/05/20 1949)  . sodium chloride    . sodium chloride 20 mL/hr at 02/06/20 0400  . sodium chloride    . amiodarone Stopped (02/08/20 0725)  . lactated ringers Stopped (02/05/20 1315)  . lactated ringers Stopped (02/07/20 0445)     Lab Results  Component Value Date   WBC 10.1 02/08/2020   HGB 10.8 (L) 02/08/2020   HCT 33.3 (L) 02/08/2020   PLT 227 02/08/2020   GLUCOSE 133 (H) 02/08/2020   CHOL 206 (H) 09/20/2019   TRIG 135 09/20/2019   HDL 33 (L) 09/20/2019   LDLDIRECT 152.5 10/05/2010   LDLCALC 148 (H) 09/20/2019   ALT 16 02/05/2020   AST 24 02/05/2020   NA 133 (L) 02/08/2020   K 3.7 02/08/2020   CL 101 02/08/2020   CREATININE 0.93 02/08/2020   BUN 16 02/08/2020   CO2 21 (L) 02/08/2020   TSH 5.401 (H) 02/08/2020   INR 1.4 (H) 02/05/2020   HGBA1C 5.6 02/01/2020    Sinus with pacs , will give beta blocker early   Grace Isaac MD  Beeper [phone] Office [phone] 02/08/2020 7:04 PM

## 2020-02-08 NOTE — Progress Notes (Signed)
CARDIAC REHAB PHASE I   PRE:  Rate/Rhythm: 83 SR  BP:  Sitting: 93/61      SaO2: 100 2L  MODE:  Ambulation: 230 ft   POST:  Rate/Rhythm: 102 ST  BP:  Sitting: 113/74    SaO2: 97 2L  Pt ambulated 263ft in hallway standby assist with EVA. Pt more SOB today, states the mask is making it worse. Pt took a standing rest break, coached through purse lipped breathing. Pt maintained sats on 2L throughout the walk. Pt helped into bed. Encouraged continued ambulation, and IS/Flutter use. Pt requesting pain meds, RN made aware. Will continue to follow.  [phone] Rufina Falco, RN BSN 02/08/2020 10:25 AM

## 2020-02-08 NOTE — Progress Notes (Signed)
Patient ID: Jeffery Turner, male   DOB: 12-21-1940, 79 y.o.   MRN: [id number] T[facility] DAILY ICU PROGRESS NOTE                   [address]            [place] [address]          [phone]   3 Days Post-Op Procedure(s) (LRB): CORONARY ARTERY BYPASS GRAFTING (CABG), ON PUMP, TIMES THREE, USING LEFT INTERNAL MAMMARY ARTERY TO LEFT ANTERIOR DESCENDING ARTERY, REVERSE SAPHENOUS VEIN GRAFT TO POSTERIOR DESCENDING ARTERY AND CIRCUMFLEX ARTERY (N/A) TRANSESOPHAGEAL ECHOCARDIOGRAM (TEE) (N/A) ENDOVEIN HARVEST OF GREATER SAPHENOUS VEIN (Right)  Total Length of Stay:  LOS: 3 days   Subjective: Patient up in chair, has converted to sinus rhythm, has had some cough currently on Mucinex, no fever or chills  Objective: Vital signs in last 24 hours: Temp:  [97.6 F (36.4 C)-98.8 F (37.1 C)] 98.8 F (37.1 C) (08/27 0742) Pulse Rate:  [28-123] 83 (08/27 0829) Cardiac Rhythm: Normal sinus rhythm (08/27 0730) Resp:  [16-30] 16 (08/27 0829) BP: (94-132)/(59-118) 120/69 (08/27 0800) SpO2:  [37 %-100 %] 93 % (08/27 0829) Weight:  [88.7 kg] 88.7 kg (08/27 0500)  Filed Weights   02/06/20 0500 02/07/20 0500 02/08/20 0500  Weight: 95.3 kg 96.1 kg 88.7 kg    Weight change: -7.4 kg   Hemodynamic parameters for last 24 hours:    Intake/Output from previous day: 08/26 0701 - 08/27 0700 In: 1228.9 [P.O.:480; I.V.:748.9] Out: 1625 [Urine:1625]  Intake/Output this shift: No intake/output data recorded.  Current Meds: Scheduled Meds: . acetaminophen  1,000 mg Oral Q6H   Or  . acetaminophen (TYLENOL) oral liquid 160 mg/5 mL  1,000 mg Per Tube Q6H  . amiodarone  200 mg Oral Q12H   Followed by  . [START ON 02/15/2020] amiodarone  200 mg Oral Daily  . aspirin EC  325 mg Oral Daily   Or  . aspirin  324 mg Per Tube Daily  . bisacodyl  10 mg Oral Daily   Or  . bisacodyl  10 mg Rectal Daily  . Chlorhexidine Gluconate Cloth  6 each Topical Daily  . docusate sodium  200 mg Oral  Daily  . enoxaparin (LOVENOX) injection  40 mg Subcutaneous QHS  . guaiFENesin  1,200 mg Oral BID  . levalbuterol  0.63 mg Nebulization Q6H  . metoprolol tartrate  12.5 mg Oral BID   Or  . metoprolol tartrate  12.5 mg Per Tube BID  . pantoprazole  40 mg Oral Daily  . rosuvastatin  10 mg Oral Daily  . sodium chloride flush  3 mL Intravenous Q12H  . sodium chloride flush  3 mL Intravenous Q12H   Continuous Infusions: . sodium chloride Stopped (02/05/20 1949)  . sodium chloride    . sodium chloride 20 mL/hr at 02/06/20 0400  . sodium chloride    . amiodarone Stopped (02/08/20 0725)  . lactated ringers Stopped (02/05/20 1315)  . lactated ringers Stopped (02/07/20 0445)   PRN Meds:.sodium chloride, sodium chloride, metoprolol tartrate, ondansetron (ZOFRAN) IV, oxyCODONE, sodium chloride flush, sodium chloride flush, traMADol  General appearance: alert, cooperative and no distress Neurologic: intact Heart: regular rate and rhythm, S1, S2 normal, no murmur, click, rub or gallop Lungs: diminished breath sounds bibasilar Abdomen: soft, non-tender; bowel sounds normal; no masses,  no organomegaly Extremities: extremities normal, atraumatic, no cyanosis or edema and Homans sign is negative, no sign of DVT Wound:  Aquacel dressing in place sternum stable  Lab Results: CBC: Recent Labs    02/07/20 0536 02/08/20 0730  WBC 11.9* 10.1  HGB 10.6* 10.8*  HCT 31.9* 33.3*  PLT 208 227   BMET:  Recent Labs    02/07/20 0536 02/08/20 0730  NA 134* 133*  K 5.0 3.7  CL 104 101  CO2 21* 21*  GLUCOSE 111* 133*  BUN 19 16  CREATININE 1.03 0.93  CALCIUM 7.9* 8.2*    CMET: Lab Results  Component Value Date   WBC 10.1 02/08/2020   HGB 10.8 (L) 02/08/2020   HCT 33.3 (L) 02/08/2020   PLT 227 02/08/2020   GLUCOSE 133 (H) 02/08/2020   CHOL 206 (H) 09/20/2019   TRIG 135 09/20/2019   HDL 33 (L) 09/20/2019   LDLDIRECT 152.5 10/05/2010   LDLCALC 148 (H) 09/20/2019   ALT 16 02/05/2020    AST 24 02/05/2020   NA 133 (L) 02/08/2020   K 3.7 02/08/2020   CL 101 02/08/2020   CREATININE 0.93 02/08/2020   BUN 16 02/08/2020   CO2 21 (L) 02/08/2020   TSH 5.401 (H) 02/08/2020   INR 1.4 (H) 02/05/2020   HGBA1C 5.6 02/01/2020      PT/INR:  Recent Labs    02/05/20 1324  LABPROT 16.4*  INR 1.4*   Radiology: DG Chest 2 View  Result Date: 02/08/2020 CLINICAL DATA:  CABG. EXAM: CHEST - 2 VIEW COMPARISON:  02/07/2020. FINDINGS: Interim removal of right IJ sheath. CABG. Heart size stable. Improved bibasilar atelectasis. Tiny left pleural effusion. No pneumothorax. IMPRESSION: 1.  Interim removal of right IJ sheath. 2.  Prior CABG.  No pulmonary venous congestion. 3. Bibasilar atelectasis, improved from prior exam. Tiny left pleural effusion. Electronically Signed   By: Marcello Moores  Register   On: 02/08/2020 05:07     Assessment/Plan: S/P Procedure(s) (LRB): CORONARY ARTERY BYPASS GRAFTING (CABG), ON PUMP, TIMES THREE, USING LEFT INTERNAL MAMMARY ARTERY TO LEFT ANTERIOR DESCENDING ARTERY, REVERSE SAPHENOUS VEIN GRAFT TO POSTERIOR DESCENDING ARTERY AND CIRCUMFLEX ARTERY (N/A) TRANSESOPHAGEAL ECHOCARDIOGRAM (TEE) (N/A) ENDOVEIN HARVEST OF GREATER SAPHENOUS VEIN (Right) Mobilize Diuresis Work on pulmonary toilet  Convert to po Cordarone  - convert to sinus and maintaining  Reviewed the patient's impression about his statin allergy-notes that his concern of statin was from a friend who was on a statin but had a stroke and also another friend who had to stop it because of muscle aches and pains-patient himself has had no previous history of drug reaction to statins.  I explained to him importance of statin therapy which is now known coronary occlusive disease status post coronary artery bypass graft-he is agreeable to this guarding statin at a low dose and if no side effects increase the dose, will start Crestor today     Grace Isaac 02/08/2020 9:11 AM

## 2020-02-09 ENCOUNTER — Inpatient Hospital Stay (HOSPITAL_COMMUNITY): Payer: Medicare HMO

## 2020-02-09 LAB — GLUCOSE, CAPILLARY
Glucose-Capillary: 106 mg/dL — ABNORMAL HIGH (ref 70–99)
Glucose-Capillary: 174 mg/dL — ABNORMAL HIGH (ref 70–99)
Glucose-Capillary: 111 mg/dL — ABNORMAL HIGH (ref 70–99)
Glucose-Capillary: 109 mg/dL — ABNORMAL HIGH (ref 70–99)
Glucose-Capillary: 210 mg/dL — ABNORMAL HIGH (ref 70–99)

## 2020-02-09 LAB — BASIC METABOLIC PANEL
Anion gap: 9 (ref 5–15)
BUN: 20 mg/dL (ref 8–23)
CO2: 23 mmol/L (ref 22–32)
Calcium: 8.1 mg/dL — ABNORMAL LOW (ref 8.9–10.3)
Chloride: 101 mmol/L (ref 98–111)
Creatinine, Ser: 0.94 mg/dL (ref 0.61–1.24)
GFR calc Af Amer: >60 mL/min (ref >60)
GFR calc non Af Amer: >60 mL/min (ref >60)
Glucose, Bld: 120 mg/dL — ABNORMAL HIGH (ref 70–99)
Potassium: 3.7 mmol/L (ref 3.5–5.1)
Sodium: 133 mmol/L — ABNORMAL LOW (ref 135–145)

## 2020-02-09 LAB — CBC
HCT: 28.8 % — ABNORMAL LOW (ref 39.0–52.0)
Hemoglobin: 9.3 g/dL — ABNORMAL LOW (ref 13.0–17.0)
MCH: 29.8 pg (ref 26.0–34.0)
MCHC: 32.3 g/dL (ref 30.0–36.0)
MCV: 92.3 fL (ref 80.0–100.0)
Platelets: 239 10*3/uL (ref 150–400)
RBC: 3.12 MIL/uL — ABNORMAL LOW (ref 4.22–5.81)
RDW: 12.8 % (ref 11.5–15.5)
WBC: 7.7 10*3/uL (ref 4.0–10.5)
nRBC: 0 % (ref 0.0–0.2)

## 2020-02-09 MED ORDER — POTASSIUM CHLORIDE CRYS ER 20 MEQ PO TBCR
30.0000 meq | EXTENDED_RELEASE_TABLET | Freq: Once | ORAL | Status: AC
  Administered 2020-02-09: 30 meq via ORAL
  Filled 2020-02-09: qty 1

## 2020-02-09 MED ORDER — POTASSIUM CHLORIDE CRYS ER 20 MEQ PO TBCR
20.0000 meq | EXTENDED_RELEASE_TABLET | Freq: Once | ORAL | Status: AC
  Administered 2020-02-09: 20 meq via ORAL
  Filled 2020-02-09: qty 1

## 2020-02-09 MED ORDER — METOPROLOL TARTRATE 25 MG PO TABS
25.0000 mg | ORAL_TABLET | Freq: Two times a day (BID) | ORAL | Status: DC
  Administered 2020-02-09 – 2020-02-12 (×6): 25 mg via ORAL
  Filled 2020-02-09 (×6): qty 1

## 2020-02-09 MED ORDER — FUROSEMIDE 40 MG PO TABS
40.0000 mg | ORAL_TABLET | Freq: Every day | ORAL | Status: DC
  Administered 2020-02-09 – 2020-02-12 (×4): 40 mg via ORAL
  Filled 2020-02-09 (×4): qty 1

## 2020-02-09 MED ORDER — FERROUS SULFATE 325 (65 FE) MG PO TABS
325.0000 mg | ORAL_TABLET | Freq: Every day | ORAL | Status: DC
  Administered 2020-02-09 – 2020-02-12 (×4): 325 mg via ORAL
  Filled 2020-02-09 (×4): qty 1

## 2020-02-09 MED ORDER — FUROSEMIDE 10 MG/ML IJ SOLN
INTRAMUSCULAR | Status: AC
  Filled 2020-02-09: qty 4

## 2020-02-09 MED ORDER — FUROSEMIDE 10 MG/ML IJ SOLN
40.0000 mg | Freq: Once | INTRAMUSCULAR | Status: AC
  Administered 2020-02-09: 40 mg via INTRAVENOUS

## 2020-02-09 MED ORDER — LACTULOSE 10 GM/15ML PO SOLN
20.0000 g | Freq: Once | ORAL | Status: AC
  Administered 2020-02-09: 20 g via ORAL
  Filled 2020-02-09: qty 30

## 2020-02-09 MED ORDER — LEVALBUTEROL HCL 0.63 MG/3ML IN NEBU
0.6300 mg | INHALATION_SOLUTION | Freq: Four times a day (QID) | RESPIRATORY_TRACT | Status: DC | PRN
  Administered 2020-02-09: 0.63 mg via RESPIRATORY_TRACT
  Filled 2020-02-09: qty 3

## 2020-02-09 MED ORDER — POTASSIUM CHLORIDE CRYS ER 20 MEQ PO TBCR
20.0000 meq | EXTENDED_RELEASE_TABLET | Freq: Every day | ORAL | Status: DC
  Administered 2020-02-10 – 2020-02-12 (×3): 20 meq via ORAL
  Filled 2020-02-09 (×3): qty 1

## 2020-02-09 MED ORDER — AMIODARONE IV BOLUS ONLY 150 MG/100ML
150.0000 mg | Freq: Once | INTRAVENOUS | Status: AC
  Administered 2020-02-09: 150 mg via INTRAVENOUS
  Filled 2020-02-09: qty 100

## 2020-02-09 NOTE — Progress Notes (Addendum)
Patient up to bed side commode, patient heart rate up to 160s on monitor. Patient still short of breath patient assisted back to bed. bp 120/82, oxygen on 2L [place] sats 99%, EKG obtained and placed on chart.  Lars Pinks Paged to make aware. Will monitor patient. Melat Wrisley, Bettina Gavia RN  3700 Dr. Servando Snare paged and made aware of patient in Atrial fib. Orders received. Will continue to monitor patient. Phong Isenberg, Bettina Gavia RN

## 2020-02-09 NOTE — Progress Notes (Addendum)
      [address]       ,[place] [address]             [phone]        4 Days Post-Op Procedure(s) (LRB): CORONARY ARTERY BYPASS GRAFTING (CABG), ON PUMP, TIMES THREE, USING LEFT INTERNAL MAMMARY ARTERY TO LEFT ANTERIOR DESCENDING ARTERY, REVERSE SAPHENOUS VEIN GRAFT TO POSTERIOR DESCENDING ARTERY AND CIRCUMFLEX ARTERY (N/A) TRANSESOPHAGEAL ECHOCARDIOGRAM (TEE) (N/A) ENDOVEIN HARVEST OF GREATER SAPHENOUS VEIN (Right)  Subjective: Patient resting this am. He has not had a bowel movement yet.  Objective: Vital signs in last 24 hours: Temp:  [97.6 F (36.4 C)-98.7 F (37.1 C)] 98.4 F (36.9 C) (08/28 0742) Pulse Rate:  [74-101] 93 (08/28 0742) Cardiac Rhythm: Normal sinus rhythm (08/27 2029) Resp:  [16-26] 20 (08/28 0742) BP: (90-144)/(55-88) 110/67 (08/28 0742) SpO2:  [93 %-100 %] 100 % (08/28 0742) Weight:  [94.1 kg] 94.1 kg (08/28 0500)  Pre op weight 93.4 kg Current Weight  02/09/20 94.1 kg      Intake/Output from previous day: 08/27 0701 - 08/28 0700 In: 257.5 [I.V.:257.5] Out: 1825 [Urine:1825]   Physical Exam:  Cardiovascular: RRR Pulmonary: Scattered rhonchi Abdomen: Soft, non tender, bowel sounds present. Extremities: Mild bilateral lower extremity edema. Wounds: Sternal dressing is removed and sound is clean and dry.  No erythema or signs of infection. RLE wound is clean and dry  Lab Results: CBC: Recent Labs    02/08/20 0730 02/09/20 0201  WBC 10.1 7.7  HGB 10.8* 9.3*  HCT 33.3* 28.8*  PLT 227 239   BMET:  Recent Labs    02/08/20 0730 02/09/20 0201  NA 133* 133*  K 3.7 3.7  CL 101 101  CO2 21* 23  GLUCOSE 133* 120*  BUN 16 20  CREATININE 0.93 0.94  CALCIUM 8.2* 8.1*    PT/INR:  Lab Results  Component Value Date   INR 1.4 (H) 02/05/2020   INR 1.0 02/04/2020   INR 2.9 (H) 02/01/2020   ABG:  INR: Will add last result for INR, ABG once components are confirmed Will add last 4 CBG results once components are  confirmed  Assessment/Plan:  1. CV - Previous a fib. Mostly SR this am. On Amiodarone 200 mg bid and Lopressor 12.5 mg bid. If has further a fib, will need to consider anticoagulation 2.  Pulmonary - On 2 liters of oxygen via [place]. Will wean as able. Had wheezing earlier so Xopenex ordered PRN. Continue Mucinex 1200 mg bid. Encourage incentive spirometer. 3. Volume Overload - Will give oral Lasix this am 4.  Expected post op acute blood loss anemia - H and H this am decreased to 9.3 and 28.8. Restart oral ferrous 5. CBGs 110/106/174. Pre op HGA1C 5.6. Stop accu checks and SS PRN 6. Supplement potassium 7. Remove EPW 8. LOC constipation  Jeffery M ZimmermanPA-C 02/09/2020,8:08 AM  respiratory effort improved Holding sinus I have seen and examined Jeffery Turner and agree with the above assessment  and plan.  Grace Isaac MD Beeper [phone] Office [phone] 02/09/2020 2:23 PM

## 2020-02-09 NOTE — Plan of Care (Signed)
  Problem: Health Behavior/Discharge Planning: Goal: Ability to manage health-related needs will improve Outcome: Progressing   

## 2020-02-09 NOTE — Progress Notes (Signed)
CARDIAC REHAB PHASE I   PRE:  Rate/Rhythm: 80 SR  BP:  Sitting: 113/82      SaO2: 99 RA  MODE:  Ambulation: 200 ft   POST:  Rate/Rhythm: 99 SR  BP:  Sitting: 115/73    SaO2: 100 RA  PT ambulated 262ft in hallway standby assist with front wheel walker. Pt with visible SOB, pt took a standing rest break and coached through purse lipped breathing. Pt returned to bed, able to maintain sats on RA. D/c education completed with pt and granddaughter. Pt educated on importance of site care and monitoring incision daily. Encouraged continued IS use, walks, and sternal precautions. Pt given in-the-tube sheet along with heart healthy diet. Reviewed restrictions and exercise guidelines. Will refer to CRP II [place]. Pt hopeful for d/c tomorrow/  [id number] Rufina Falco, RN BSN 02/09/2020 11:27 AM

## 2020-02-09 NOTE — Progress Notes (Signed)
Mobility Specialist - Progress Note   02/09/20 1237  Mobility  Activity Ambulated in hall  Level of Assistance Modified independent, requires aide device or extra time  Distance Ambulated (ft) 240 ft  Mobility Response Tolerated fair  Mobility performed by Mobility specialist  $Mobility charge 1 Mobility    Pre-mobility: 84 HR, 98% SpO2 During mobility: 96 HR, 100% SpO2 Post-mobility: 86 HR, 100% SpO2  Pt required one standing rest break halfway through in order to catch his breath. He was able to rock-to-stand in order to adhere to sternal precautions, he expressed understanding about the importance of energy conservation. Pt was breathing heavily at the end of ambulation, his SpO2 was ~100% throughout.   Pricilla Handler Mobility Specialist Mobility Specialist Phone: [phone]

## 2020-02-09 NOTE — Significant Event (Signed)
Rapid Response Event Note   Reason for Call :  Respiratory distress and wheezing  Initial Focused Assessment:  Received a call from the RN stating that her patient is in respiratory distress.  He had just gotten back to bed from using the bathroom and he was shaking, wheezing, and pursed-lip breathing.  Respiratory was already giving the patient a breathing treatment.  His lungs were clear to auscultation but his wheeze was all upper airway.  His RR, BP, and HR were returning more to normal when I was leaving.  The patient did not appear to be in any immediate distress.  I feel that this patient's anxiety was playing a big part of this as well  BP 138/104 manual RR 31 Temp 97.8 Pulse 105  1707- I returned to check on the patient and he was still wheezing and tachypnic.  He had just gotten back to bed from using the bathroom again.  Will let pm rapid know about the events that transpired.   Interventions:  MD made aware of situation and ordered chest x-ray, oral potassium, 40 mg IV furosemide, and told us to call back if he is not better  Plan of Care:  Plan to continue to monitor the patient.  I will swing back up to assess the patient before the end of my shift.   Event Summary:   MD Notified: Tacy Dura Call Time: [place] [id number] End Time: [place], RN

## 2020-02-09 NOTE — Progress Notes (Signed)
Patient wheezing while sleeping when this RN went into the room. Woke patient up to give tylenol. Patient c/o trouble breathing. O2 sats 100% 2L. Sat patient up on side of bed and encouraged patient to use flutter valve. Patient was able to cough a little phlegm up. Patient felt hot and sweaty. Placed cold washcloth on back of patient's neck and turned his fan on. Patient feeling better. Patient c/o of pain and stated that he was coughing more then yesterday. Patient stated that, "he was regretting having the procedure done". Told patient that he will have good days and bad ones but it will get easier. Will continue to monitor

## 2020-02-09 NOTE — Progress Notes (Signed)
1515. Patient with sudden on set of shortness of breath and audible expiratory wheezing, and visible chills/ shaking. Patient recently returned from restroom with nursing staff. Patient vital signs obtained and patient assisted back to bed from chair. Patient placed on 2L [place] and respiratory Therapy called. Breathing treatment given as ordered as needed for wheezing.  bp 160/113. Respiratory rate 31. Temp 97.8 heart rate 105   1519-1526 Rapid Response RN called and at bedside. bp 138/104 manually , Respiratory rate 30, oxygen at 96 on 2L [employer]. Heart rate 113  1527 Donielle Zimmerman [facility] made aware and orders received.  1538 40 IV lasix given as ordered.  116/67 oxygen on 2L [place] 98% heart rate 103 1555 Patient still with wheezing, respiratory rate 28-30, but patient resting. States " he feels warmer not as cold"  Will continue to monitor patient. Trenell Moxey, Bettina Gavia RN

## 2020-02-09 NOTE — Progress Notes (Signed)
Patient EPW pulled per protocol and as ordered. All ends intact. Patient reminded to lie supine approximately one hour. Will monitor patient. bp 115/78 geart rate 85, Oneill Bais, Bettina Gavia RN

## 2020-02-10 ENCOUNTER — Inpatient Hospital Stay (HOSPITAL_COMMUNITY): Payer: Medicare HMO

## 2020-02-10 LAB — CBC
HCT: 28.4 % — ABNORMAL LOW (ref 39.0–52.0)
Hemoglobin: 9.4 g/dL — ABNORMAL LOW (ref 13.0–17.0)
MCH: 30.3 pg (ref 26.0–34.0)
MCHC: 33.1 g/dL (ref 30.0–36.0)
MCV: 91.6 fL (ref 80.0–100.0)
Platelets: 275 10*3/uL (ref 150–400)
RBC: 3.1 MIL/uL — ABNORMAL LOW (ref 4.22–5.81)
RDW: 12.8 % (ref 11.5–15.5)
WBC: 10.9 10*3/uL — ABNORMAL HIGH (ref 4.0–10.5)
nRBC: 0 % (ref 0.0–0.2)

## 2020-02-10 LAB — BASIC METABOLIC PANEL
Anion gap: 11 (ref 5–15)
BUN: 23 mg/dL (ref 8–23)
CO2: 22 mmol/L (ref 22–32)
Calcium: 8.5 mg/dL — ABNORMAL LOW (ref 8.9–10.3)
Chloride: 102 mmol/L (ref 98–111)
Creatinine, Ser: 0.97 mg/dL (ref 0.61–1.24)
GFR calc Af Amer: >60 mL/min (ref >60)
GFR calc non Af Amer: >60 mL/min (ref >60)
Glucose, Bld: 132 mg/dL — ABNORMAL HIGH (ref 70–99)
Potassium: 4.1 mmol/L (ref 3.5–5.1)
Sodium: 135 mmol/L (ref 135–145)

## 2020-02-10 LAB — GLUCOSE, CAPILLARY
Glucose-Capillary: 107 mg/dL — ABNORMAL HIGH (ref 70–99)
Glucose-Capillary: 111 mg/dL — ABNORMAL HIGH (ref 70–99)
Glucose-Capillary: 117 mg/dL — ABNORMAL HIGH (ref 70–99)
Glucose-Capillary: 126 mg/dL — ABNORMAL HIGH (ref 70–99)
Glucose-Capillary: 131 mg/dL — ABNORMAL HIGH (ref 70–99)
Glucose-Capillary: 156 mg/dL — ABNORMAL HIGH (ref 70–99)

## 2020-02-10 MED ORDER — LEVALBUTEROL HCL 0.63 MG/3ML IN NEBU
0.6300 mg | INHALATION_SOLUTION | Freq: Once | RESPIRATORY_TRACT | Status: AC
  Administered 2020-02-10: 0.63 mg via RESPIRATORY_TRACT
  Filled 2020-02-10: qty 3

## 2020-02-10 MED ORDER — AMIODARONE HCL 200 MG PO TABS
400.0000 mg | ORAL_TABLET | Freq: Two times a day (BID) | ORAL | Status: DC
  Administered 2020-02-10 – 2020-02-12 (×5): 400 mg via ORAL
  Filled 2020-02-10 (×5): qty 2

## 2020-02-10 MED ORDER — LEVALBUTEROL HCL 0.63 MG/3ML IN NEBU
0.6300 mg | INHALATION_SOLUTION | Freq: Two times a day (BID) | RESPIRATORY_TRACT | Status: DC
  Administered 2020-02-10 – 2020-02-12 (×4): 0.63 mg via RESPIRATORY_TRACT
  Filled 2020-02-10 (×5): qty 3

## 2020-02-10 MED ORDER — MOMETASONE FURO-FORMOTEROL FUM 100-5 MCG/ACT IN AERO
2.0000 | INHALATION_SPRAY | Freq: Two times a day (BID) | RESPIRATORY_TRACT | Status: DC
  Administered 2020-02-11 – 2020-02-12 (×3): 2 via RESPIRATORY_TRACT
  Filled 2020-02-10: qty 8.8

## 2020-02-10 MED ORDER — LEVALBUTEROL HCL 0.63 MG/3ML IN NEBU: 0.6300 mg | INHALATION_SOLUTION | Freq: Four times a day (QID) | RESPIRATORY_TRACT | Status: DC

## 2020-02-10 MED ORDER — LEVALBUTEROL HCL 0.63 MG/3ML IN NEBU: 0.6300 mg | INHALATION_SOLUTION | Freq: Two times a day (BID) | RESPIRATORY_TRACT | Status: DC

## 2020-02-10 NOTE — Progress Notes (Addendum)
      [address]       [place] [address]             [phone]        5 Days Post-Op Procedure(s) (LRB): CORONARY ARTERY BYPASS GRAFTING (CABG), ON PUMP, TIMES THREE, USING LEFT INTERNAL MAMMARY ARTERY TO LEFT ANTERIOR DESCENDING ARTERY, REVERSE SAPHENOUS VEIN GRAFT TO POSTERIOR DESCENDING ARTERY AND CIRCUMFLEX ARTERY (N/A) TRANSESOPHAGEAL ECHOCARDIOGRAM (TEE) (N/A) ENDOVEIN HARVEST OF GREATER SAPHENOUS VEIN (Right)  Subjective: Patient had bowel movement.  Objective: Vital signs in last 24 hours: Temp:  [97.6 F (36.4 C)-98.2 F (36.8 C)] 97.8 F (36.6 C) (08/29 0736) Pulse Rate:  [26-117] 92 (08/29 0736) Cardiac Rhythm: Atrial fibrillation (08/28 1907) Resp:  [18-30] 24 (08/29 0736) BP: (88-160)/(59-113) 122/64 (08/29 0736) SpO2:  [95 %-100 %] 100 % (08/29 0736) Weight:  [92.9 kg] 92.9 kg (08/29 0300)  Pre op weight 93.4 kg Current Weight  02/10/20 92.9 kg      Intake/Output from previous day: 08/28 0701 - 08/29 0700 In: -  Out: 652 [Urine:650; Stool:2]   Physical Exam:  Cardiovascular: RRR Pulmonary: Expiratory wheezing Abdomen: Soft, non tender, bowel sounds present. Extremities: Mild bilateral lower extremity edema. Wounds: Sternal wound is clean and dry.  No erythema or signs of infection. RLE wound is clean and dry  Lab Results: CBC: Recent Labs    02/09/20 0201 02/10/20 0206  WBC 7.7 10.9*  HGB 9.3* 9.4*  HCT 28.8* 28.4*  PLT 239 275   BMET:  Recent Labs    02/09/20 0201 02/10/20 0206  NA 133* 135  K 3.7 4.1  CL 101 102  CO2 23 22  GLUCOSE 120* 132*  BUN 20 23  CREATININE 0.94 0.97  CALCIUM 8.1* 8.5*    PT/INR:  Lab Results  Component Value Date   INR 1.4 (H) 02/05/2020   INR 1.0 02/04/2020   INR 2.9 (H) 02/01/2020   ABG:  INR: Will add last result for INR, ABG once components are confirmed Will add last 4 CBG results once components are confirmed  Assessment/Plan:  1. CV - PAF (he was in SR  yesterday morning but went back into a fib with RVR early evening).  Will give Amiodarone 400 mg bid (try to load )and on Lopressor 25 mg bid. Hope to avoid anticoagulation, if possible 2.  Pulmonary - On room air. CXR this am appears stable (bibasilar atelectasis, b/l  pleural effusions R>L). Xopenex ordered PRN for wheezing. Continue Mucinex 1200 mg bid. Will give Xopenex scheduled and start Dulera.  Patient may have undiagnosed OSA as well.  Encourage incentive spirometer. 3. Volume Overload - Will give oral Lasix again this am 4.  Expected post op acute blood loss anemia - H and H this am stable at  9.4 and 28.4. Restart oral ferrous 5. Hopefully, home in next few days  Donielle M ZimmermanPA-C 02/10/2020,8:13 AM   Brief afib last night, now sinus, patient notes he has history of breathing issues for long time  Increase respiratory rx  I have seen and examined Jan A Haring and agree with the above assessment  and plan.  Grace Isaac MD Beeper [phone] Office [phone] 02/10/2020 1:10 PM

## 2020-02-10 NOTE — Progress Notes (Signed)
Mobility Specialist - Progress Note   02/10/20 1059  Mobility  Activity Ambulated in hall;Ambulated to bathroom  Level of Assistance Modified independent, requires aide device or extra time  Assistive Device Front wheel walker  Distance Ambulated (ft) 260 ft  Mobility Response Tolerated well  Mobility performed by Mobility specialist  $Mobility charge 1 Mobility    Pre-mobility: 78 HR, 100% SpO2 During mobility: 83 HR, 100%SpO2 Post-mobility:76  HR, 100% SpO2  Pt able to rock-to-stand off of toilet and bed w/o assistance. He requires mod assist in order to sit-up on the edge of bed.   Pricilla Handler Mobility Specialist Mobility Specialist Phone: [phone]

## 2020-02-11 MED FILL — Lidocaine HCl Local Soln Prefilled Syringe 100 MG/5ML (2%): INTRAMUSCULAR | Qty: 5 | Status: AC

## 2020-02-11 MED FILL — Sodium Chloride IV Soln 0.9%: INTRAVENOUS | Qty: 2000 | Status: AC

## 2020-02-11 MED FILL — Sodium Bicarbonate IV Soln 8.4%: INTRAVENOUS | Qty: 50 | Status: AC

## 2020-02-11 MED FILL — Electrolyte-R (PH 7.4) Solution: INTRAVENOUS | Qty: 5000 | Status: AC

## 2020-02-11 MED FILL — Mannitol IV Soln 20%: INTRAVENOUS | Qty: 500 | Status: AC

## 2020-02-11 MED FILL — Heparin Sodium (Porcine) Inj 1000 Unit/ML: INTRAMUSCULAR | Qty: 20 | Status: AC

## 2020-02-11 NOTE — Progress Notes (Addendum)
      [address]       [place] [address]             [phone]      6 Days Post-Op Procedure(s) (LRB): CORONARY ARTERY BYPASS GRAFTING (CABG), ON PUMP, TIMES THREE, USING LEFT INTERNAL MAMMARY ARTERY TO LEFT ANTERIOR DESCENDING ARTERY, REVERSE SAPHENOUS VEIN GRAFT TO POSTERIOR DESCENDING ARTERY AND CIRCUMFLEX ARTERY (N/A) TRANSESOPHAGEAL ECHOCARDIOGRAM (TEE) (N/A) ENDOVEIN HARVEST OF GREATER SAPHENOUS VEIN (Right)   Subjective:  Just waking up.  Feels like he is breathing better after his breathing treatments.  + ambulation  Objective: Vital signs in last 24 hours: Temp:  [97.8 F (36.6 C)-98.5 F (36.9 C)] 98.2 F (36.8 C) (08/30 0333) Pulse Rate:  [72-92] 72 (08/30 0333) Cardiac Rhythm: Normal sinus rhythm (08/29 2101) Resp:  [17-24] 19 (08/30 0333) BP: (101-126)/(64-75) 126/75 (08/30 0333) SpO2:  [98 %-100 %] 100 % (08/30 0333) Weight:  [92.9 kg] 92.9 kg (08/30 0333)  Intake/Output from previous day: 08/29 0701 - 08/30 0700 In: 721 [P.O.:721] Out: -   General appearance: alert, cooperative and no distress Heart: regular rate and rhythm Lungs: clear to auscultation bilaterally Abdomen: soft, non-tender; bowel sounds normal; no masses,  no organomegaly Extremities: edema trace Wound: clean and dry, extensive bruising of RLE  Lab Results: Recent Labs    02/09/20 0201 02/10/20 0206  WBC 7.7 10.9*  HGB 9.3* 9.4*  HCT 28.8* 28.4*  PLT 239 275   BMET:  Recent Labs    02/09/20 0201 02/10/20 0206  NA 133* 135  K 3.7 4.1  CL 101 102  CO2 23 22  GLUCOSE 120* 132*  BUN 20 23  CREATININE 0.94 0.97  CALCIUM 8.1* 8.5*    PT/INR: No results for input(s): LABPROT, INR in the last 72 hours. ABG    Component Value Date/Time   PHART 7.405 02/05/2020 1833   HCO3 19.2 (L) 02/05/2020 1833   TCO2 20 (L) 02/05/2020 1833   ACIDBASEDEF 5.0 (H) 02/05/2020 1833   O2SAT 97.0 02/05/2020 1833   CBG (last 3)  Recent Labs    02/10/20 1147  02/10/20 1553 02/10/20 2057  GLUCAP 111* 107* 117*    Assessment/Plan: S/P Procedure(s) (LRB): CORONARY ARTERY BYPASS GRAFTING (CABG), ON PUMP, TIMES THREE, USING LEFT INTERNAL MAMMARY ARTERY TO LEFT ANTERIOR DESCENDING ARTERY, REVERSE SAPHENOUS VEIN GRAFT TO POSTERIOR DESCENDING ARTERY AND CIRCUMFLEX ARTERY (N/A) TRANSESOPHAGEAL ECHOCARDIOGRAM (TEE) (N/A) ENDOVEIN HARVEST OF GREATER SAPHENOUS VEIN (Right)  1. CV- PAF, currently maintaining NSR- continue Amiodarone, Lopressor 2. Pulm- off oxygen, continue xopenex prn, continue Mucinex, Dulera 3. Renal- creatinine has been WNL, weight is below baseline, continue Lasix, potassium 4. Dispo- patient stable, respiratory status improved, off oxygen, continue diuretics, possibly for d/c today vs tomorrow, will defer to Dr. Servando Snare   LOS: 6 days    Ellwood Handler, PA-C 02/11/2020  Holding sinus , respiratory effort improved  Continue diuretic Poss home tomorrow I have seen and examined Jeffery Turner and agree with the above assessment  and plan.  Grace Isaac MD Beeper [phone] Office [phone] 02/11/2020 8:10 AM

## 2020-02-11 NOTE — Progress Notes (Signed)
Mobility Specialist - Progress Note   02/11/20 1409  Mobility  Activity Ambulated in hall;Ambulated to bathroom  Level of Assistance Modified independent, requires aide device or extra time  Assistive Device Front wheel walker  Distance Ambulated (ft) 490 ft (Intervals: 250 ft x 1, 240 ft x 1)  Mobility Response Tolerated fair  Mobility performed by Mobility specialist  $Mobility charge 1 Mobility    Pre-mobility: 83 HR, 100% SpO2 Post-mobility: 73 HR, 96% SpO2  Pt required standing rest breaks in the above noted intervals due to feeling out of breath. He sat on the toilet to urinate and was able to sit down and stand up on his own while adhering to sternal precautions. Pt assisted in bed and left in room w/ his wife.    Pricilla Handler Mobility Specialist Mobility Specialist Phone: [phone]

## 2020-02-11 NOTE — Progress Notes (Signed)
CARDIAC REHAB PHASE I   PRE:  Rate/Rhythm: 81 SR  BP:  Sitting: 116/69      SaO2: 100 RA  MODE:  Ambulation: 470 ft   POST:  Rate/Rhythm: 101 Afib --> 87SR  BP:  Sitting: 129/83    SaO2: 97 RA  Pt eager to ambulate. Able exit bed while maintaining sternal precautions with assistance. Pt helped to BR, than ambulated 447ft in hallway standby assist with front wheel walker. Pt took several standing resting breaks c/o SOB. Pt able to maintain sats on RA, coached through purse lipped breathing. Pt returned to recliner, encouraged continued ambulation and IS use. Will continue to follow.  [id number] Rufina Falco, RN BSN 02/11/2020 9:35 AM

## 2020-02-11 NOTE — Plan of Care (Signed)
  Problem: Clinical Measurements: Goal: Will remain free from infection Outcome: Progressing Goal: Diagnostic test results will improve Outcome: Progressing   

## 2020-02-12 ENCOUNTER — Other Ambulatory Visit: Payer: Self-pay | Admitting: Physician Assistant

## 2020-02-12 MED ORDER — FUROSEMIDE 40 MG PO TABS
40.0000 mg | ORAL_TABLET | Freq: Every day | ORAL | 0 refills | Status: DC
Start: 2020-02-12 — End: 2020-03-28

## 2020-02-12 MED ORDER — METOPROLOL TARTRATE 25 MG PO TABS
25.0000 mg | ORAL_TABLET | Freq: Two times a day (BID) | ORAL | 3 refills | Status: DC
Start: 2020-02-12 — End: 2020-06-17

## 2020-02-12 MED ORDER — POTASSIUM CHLORIDE CRYS ER 20 MEQ PO TBCR
20.0000 meq | EXTENDED_RELEASE_TABLET | Freq: Every day | ORAL | 0 refills | Status: DC
Start: 2020-02-12 — End: 2020-03-28

## 2020-02-12 MED ORDER — TRAMADOL HCL 50 MG PO TABS
50.0000 mg | ORAL_TABLET | ORAL | 0 refills | Status: DC | PRN
Start: 2020-02-12 — End: 2020-03-28

## 2020-02-12 MED ORDER — ROSUVASTATIN CALCIUM 10 MG PO TABS
10.0000 mg | ORAL_TABLET | Freq: Every day | ORAL | 3 refills | Status: DC
Start: 2020-02-12 — End: 2020-07-23

## 2020-02-12 MED ORDER — MOMETASONE FURO-FORMOTEROL FUM 100-5 MCG/ACT IN AERO: 2.0000 | INHALATION_SPRAY | Freq: Two times a day (BID) | RESPIRATORY_TRACT | 1 refills | Status: DC

## 2020-02-12 MED ORDER — AMIODARONE HCL 200 MG PO TABS
400.0000 mg | ORAL_TABLET | Freq: Two times a day (BID) | ORAL | 1 refills | Status: DC
Start: 2020-02-12 — End: 2020-03-28

## 2020-02-12 NOTE — Progress Notes (Signed)
      [address]       [place] [address]             [phone]      7 Days Post-Op Procedure(s) (LRB): CORONARY ARTERY BYPASS GRAFTING (CABG), ON PUMP, TIMES THREE, USING LEFT INTERNAL MAMMARY ARTERY TO LEFT ANTERIOR DESCENDING ARTERY, REVERSE SAPHENOUS VEIN GRAFT TO POSTERIOR DESCENDING ARTERY AND CIRCUMFLEX ARTERY (N/A) TRANSESOPHAGEAL ECHOCARDIOGRAM (TEE) (N/A) ENDOVEIN HARVEST OF GREATER SAPHENOUS VEIN (Right)   Subjective:  Continues to feel better.  Feels like shortness of breath is improving.  He is ambulating better as well.  Objective: Vital signs in last 24 hours: Temp:  [97.7 F (36.5 C)-98.3 F (36.8 C)] 97.7 F (36.5 C) (08/31 0326) Pulse Rate:  [70-84] 70 (08/31 0326) Cardiac Rhythm: Normal sinus rhythm (08/30 2200) Resp:  [16-23] 19 (08/31 0326) BP: (100-129)/(64-76) 125/71 (08/31 0326) SpO2:  [95 %-100 %] 95 % (08/31 0326) Weight:  [92.1 kg] 92.1 kg (08/31 0326)  Intake/Output from previous day: 08/30 0701 - 08/31 0700 In: 420 [P.O.:420] Out: 1750 [Urine:1750]  General appearance: alert, cooperative and no distress Heart: regular rate and rhythm Lungs: coarse clears with cough Abdomen: soft, non-tender; bowel sounds normal; no masses,  no organomegaly Extremities: edema trace Wound: clean and dry, extensive ecchymosis RLE  Lab Results: Recent Labs    02/10/20 0206  WBC 10.9*  HGB 9.4*  HCT 28.4*  PLT 275   BMET:  Recent Labs    02/10/20 0206  NA 135  K 4.1  CL 102  CO2 22  GLUCOSE 132*  BUN 23  CREATININE 0.97  CALCIUM 8.5*    PT/INR: No results for input(s): LABPROT, INR in the last 72 hours. ABG    Component Value Date/Time   PHART 7.405 02/05/2020 1833   HCO3 19.2 (L) 02/05/2020 1833   TCO2 20 (L) 02/05/2020 1833   ACIDBASEDEF 5.0 (H) 02/05/2020 1833   O2SAT 97.0 02/05/2020 1833   CBG (last 3)  Recent Labs    02/10/20 1147 02/10/20 1553 02/10/20 2057  GLUCAP 111* 107* 117*     Assessment/Plan: S/P Procedure(s) (LRB): CORONARY ARTERY BYPASS GRAFTING (CABG), ON PUMP, TIMES THREE, USING LEFT INTERNAL MAMMARY ARTERY TO LEFT ANTERIOR DESCENDING ARTERY, REVERSE SAPHENOUS VEIN GRAFT TO POSTERIOR DESCENDING ARTERY AND CIRCUMFLEX ARTERY (N/A) TRANSESOPHAGEAL ECHOCARDIOGRAM (TEE) (N/A) ENDOVEIN HARVEST OF GREATER SAPHENOUS VEIN (Right)  1. CV- maintaining NSR, no further A. Fib- Amiodarone, Lopressor 2. Pulm- off oxygen, continue Mucinex, dulera 3. Renal-creatinine has been stable, weight is trending down, will taper Lasix 4. Dispo- patient stable, no further A. Fib, will d/c home today   LOS: 7 days    Ellwood Handler, PA-C 02/12/2020

## 2020-02-12 NOTE — Care Management Important Message (Signed)
Important Message  Patient Details  Name: Jeffery Turner MRN: [id number] Date of Birth: 04-Mar-1941   Medicare Important Message Given:  Yes  Mailed IM letter to patient upon discharged.     Holli Humbles Smith 02/12/2020, 11:56 AM

## 2020-02-12 NOTE — Progress Notes (Signed)
D/C instructions given to pt and wife. Woudn care and medications reviewed. All questions answered.  IV removed, clean and intact. Wife to escort pt home.  Clyde Canterbury, RN

## 2020-02-12 NOTE — Progress Notes (Signed)
CARDIAC REHAB PHASE I   D/c education reinforced with pt. Pt educated on importance of site care and monitoring incision daily. Encouraged continued ambulation, IS use, and sternal precautions. In-the-tube sheet and heart healthy diet at bedside. Reviewed restrictions and exercise guidelines. Pt referred to CRP II [place].  [id number] Rufina Falco, RN BSN 02/12/2020 8:55 AM

## 2020-02-13 DIAGNOSIS — D649 Anemia, unspecified: Secondary | ICD-10-CM | POA: Insufficient documentation

## 2020-02-13 DIAGNOSIS — H53121 Transient visual loss, right eye: Secondary | ICD-10-CM | POA: Insufficient documentation

## 2020-02-13 DIAGNOSIS — H05249 Constant exophthalmos, unspecified eye: Secondary | ICD-10-CM | POA: Insufficient documentation

## 2020-02-13 DIAGNOSIS — I2510 Atherosclerotic heart disease of native coronary artery without angina pectoris: Secondary | ICD-10-CM | POA: Insufficient documentation

## 2020-02-13 DIAGNOSIS — H9190 Unspecified hearing loss, unspecified ear: Secondary | ICD-10-CM | POA: Insufficient documentation

## 2020-02-13 DIAGNOSIS — E782 Mixed hyperlipidemia: Secondary | ICD-10-CM | POA: Insufficient documentation

## 2020-02-13 NOTE — Progress Notes (Deleted)
Cardiology Office Note:    Date:  02/13/2020   ID:  Jeffery Turner, DOB Sep 27, 1940, MRN [id number]  PCP:  Rochel Brome, MD  Cardiologist:  Shirlee More, MD    Referring MD: Rochel Brome, MD    ASSESSMENT:    No diagnosis found. PLAN:    In order of problems listed above:  1. ***   Next appointment: ***   Medication Adjustments/Labs and Tests Ordered: Current medicines are reviewed at length with the patient today.  Concerns regarding medicines are outlined above.  No orders of the defined types were placed in this encounter.  No orders of the defined types were placed in this encounter.   No chief complaint on file.   History of Present Illness:    Jeffery Turner is a 79 y.o. male with a hx of CAD having had PCI although all of his native vessels with PCI of the left circumflex 1997 subsequent PCI and stent LAD and right coronary artery January 2012.  Other problems include severe hyperlipidemia  He was last seen 01/17/2020 with unstable angina referred for coronary angiography.  Coronary angiography showed severe three-vessel CABG EF 50 to 55%.  Seen in consultation by cardiothoracic surgery and underwent CABG Dr. Gar Gibbon [facility] 02/05/2020 with a left thoracic artery anastomosis to left anterior descending saphenous vein graft to circumflex and a saphenous vein graft to the posterior descending artery.  Fibrillation treated with IV amiodarone with conversion to sinus rhythm and transition to oral amiodarone.  He was anemic postoperatively with a discharge hemoglobin of 9.4 treated with iron supplementation.  Compliance with diet, lifestyle and medications: ***  Significant Diagnostic Studies: angiography:    Severe multivessel CAD including LEFT MAIN:  Ost LM lesion is 60% stenosed.  Mid LAD stent has diffuse 70% in-stent restenosis  -- Jailed 1st Diag-1 lesion is 50% stenosed. 1st Diag-2 lesion is 45% stenosed.  Prox Cx lesion is 40% stenosed.  Prox  Cx to Mid Cx lesion is 75% stenosed leading up to prior stent.  Mid Cx to Dist Cx stent is 80% stenosed.  Dist Cx lesion is 70% stenosed with 50% stenosed side branch in 4th Mrg.  Prox RCA lesion is 65% stenosed.  Prox RCA to Mid RCA stent has diffuse 70% in-stent restenosis  Dist RCA lesion is 50% stenosed with 50% stenosed side branch in RPDA. RPDA lesion is 100% stenosed.  -------------  The left ventricular systolic function is normal. The left ventricular ejection fraction is 50-55% by visual estimate --anterolateral as well as basal to mid inferior hypokinesis.  LV end diastolic pressure is normal. Past Medical History:  Diagnosis Date  . Anemia   . Angina pectoris ([place]) 02/06/2018  . Angina, class III ([place]) 02/06/2018  . Anginal equivalent ([place]) 09/19/2019  . CAD (coronary artery disease)    s/p stent to CFX 1997 2/2 MI s/p DES to [facility] and DES mLAD 07/14/10 2/2 [place] cath 07/14/10: dLAD 60%; CFX stent ok; OM1 30-40%; pRCA 30%; PDA 40%; EF 60%  . Constant exophthalmos    right  . Coronary artery disease of native artery of native heart with stable angina pectoris ([place]) 07/21/2010   Qualifier: Diagnosis of  By: Denny Peon, CMA, [name]    . Epistaxis 07/21/2010   Qualifier: Diagnosis of  By: Jorene Minors, Scott    . Hearing loss    right eye - no hearing aids  . Hyperlipidemia   . MI (myocardial infarction) ([place])   . Mixed hyperlipidemia  07/21/2010   Qualifier: Diagnosis of  By: Ronne Binning    . Old myocardial infarction 07/21/2010   Qualifier: Diagnosis of  By: Hester Mates, Carol    . Osteoarthritis   . OSTEOARTHRITIS 07/21/2010   Qualifier: Diagnosis of  By: Ronne Binning    . 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 Shortness of breath 07/21/2010   Qualifier: Diagnosis of  By: Jorene Minors, Scott    . TOBACCO USE, QUIT 07/21/2010   Qualifier: Diagnosis of  By: Orville Govern CMA, Arbie Cookey    . Transient visual loss, right    wears glasses  . UNSTABLE ANGINA 07/21/2010   Qualifier: Diagnosis of  By:  Ronne Binning      Past Surgical History:  Procedure Laterality Date  . CARDIAC CATHETERIZATION    . CORONARY ARTERY BYPASS GRAFT N/A 02/05/2020   Procedure: CORONARY ARTERY BYPASS GRAFTING (CABG), ON PUMP, TIMES THREE, USING LEFT INTERNAL MAMMARY ARTERY TO LEFT ANTERIOR DESCENDING ARTERY, REVERSE SAPHENOUS VEIN GRAFT TO POSTERIOR DESCENDING ARTERY AND CIRCUMFLEX ARTERY;  Surgeon: Grace Isaac, MD;  Location: [facility];  Service: Open Heart Surgery;  Laterality: N/A;  . ENDOVEIN HARVEST OF GREATER SAPHENOUS VEIN Right 02/05/2020   Procedure: ENDOVEIN HARVEST OF GREATER SAPHENOUS VEIN;  Surgeon: Grace Isaac, MD;  Location: [facility];  Service: Open Heart Surgery;  Laterality: Right;  . LEFT HEART CATH AND CORONARY ANGIOGRAPHY N/A 01/25/2020   Procedure: LEFT HEART CATH AND CORONARY ANGIOGRAPHY;  Surgeon: Leonie Man, MD;  Location: [facility];  Service: Cardiovascular;  Laterality: N/A;  . TEE WITHOUT CARDIOVERSION N/A 02/05/2020   Procedure: TRANSESOPHAGEAL ECHOCARDIOGRAM (TEE);  Surgeon: Grace Isaac, MD;  Location: [facility];  Service: Open Heart Surgery;  Laterality: N/A;  . TONSILLECTOMY AND ADENOIDECTOMY    . WISDOM TOOTH EXTRACTION      Current Medications: No outpatient medications have been marked as taking for the 02/14/20 encounter (Appointment) with Richardo Priest, MD.     Allergies:   Codeine and Statins   Social History   Socioeconomic History  . Marital status: Married    Spouse name: Not on file  . Number of children: Not on file  . Years of education: Not on file  . Highest education level: Not on file  Occupational History  . Not on file  Tobacco Use  . Smoking status: Former Smoker    Packs/day: 1.50    Years: 37.00    Pack years: 55.50    Types: Cigarettes    Quit date: 05/2000    Years since quitting: 19.7  . Smokeless tobacco: Never Used  Vaping Use  . Vaping Use: Never used  Substance and Sexual Activity  . Alcohol use: No  .  Drug use: No  . Sexual activity: Not Currently  Other Topics Concern  . Not on file  Social History Narrative   He lives in [place] with family.  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         Social Determinants of Health   Financial Resource Strain:   . Difficulty of Paying Living Expenses: Not on file  Food Insecurity:   . Worried About Charity fundraiser in the Last Year: Not on file  . Ran Out of Food in the Last Year: Not on file  Transportation Needs:   .  Lack of Transportation (Medical): Not on file  . Lack of Transportation (Non-Medical): Not on file  Physical Activity:   . Days of Exercise per Week: Not on file  . Minutes of Exercise per Session: Not on file  Stress:   . Feeling of Stress : Not on file  Social Connections:   . Frequency of Communication with Friends and Family: Not on file  . Frequency of Social Gatherings with Friends and Family: Not on file  . Attends Religious Services: Not on file  . Active Member of Clubs or Organizations: Not on file  . Attends Archivist Meetings: Not on file  . Marital Status: Not on file     Family History: The patient's ***family history includes Alzheimer's disease in his mother; Coronary artery disease in an other family member; Heart failure in his mother. ROS:   Please see the history of present illness.    All other systems reviewed and are negative.  EKGs/Labs/Other Studies Reviewed:    The following studies were reviewed today:  EKG:  EKG ordered today and personally reviewed.  The ekg ordered today demonstrates ***  Recent Labs: 01/17/2020: NT-Pro BNP 73 02/05/2020: ALT 16 02/08/2020: Magnesium 1.8; TSH 5.401 02/10/2020: BUN 23; Creatinine, Ser 0.97; Hemoglobin 9.4; Platelets 275; Potassium 4.1; Sodium 135  Recent Lipid Panel    Component Value Date/Time   CHOL 206 (H) 09/20/2019 0854   TRIG  135 09/20/2019 0854   HDL 33 (L) 09/20/2019 0854   CHOLHDL 6.2 (H) 09/20/2019 0854   CHOLHDL 4 04/08/2011 1202   VLDL 17.8 04/08/2011 1202   LDLCALC 148 (H) 09/20/2019 0854   LDLDIRECT 152.5 10/05/2010 0832    Physical Exam:    VS:  There were no vitals taken for this visit.    Wt Readings from Last 3 Encounters:  02/12/20 203 lb (92.1 kg)  02/01/20 206 lb 9.6 oz (93.7 kg)  01/29/20 208 lb (94.3 kg)     GEN: *** Well nourished, well developed in no acute distress HEENT: Normal NECK: No JVD; No carotid bruits LYMPHATICS: No lymphadenopathy CARDIAC: ***RRR, no murmurs, rubs, gallops RESPIRATORY:  Clear to auscultation without rales, wheezing or rhonchi  ABDOMEN: Soft, non-tender, non-distended MUSCULOSKELETAL:  No edema; No deformity  SKIN: Warm and dry NEUROLOGIC:  Alert and oriented x 3 PSYCHIATRIC:  Normal affect    Signed, Shirlee More, MD  02/13/2020 3:13 PM    [facility]

## 2020-02-14 ENCOUNTER — Ambulatory Visit: Payer: Medicare HMO | Admitting: Cardiology

## 2020-02-15 ENCOUNTER — Encounter: Payer: Self-pay | Admitting: Cardiology

## 2020-02-15 ENCOUNTER — Ambulatory Visit: Payer: Medicare HMO | Admitting: Cardiology

## 2020-02-15 ENCOUNTER — Telehealth (HOSPITAL_COMMUNITY): Payer: Self-pay

## 2020-02-15 ENCOUNTER — Other Ambulatory Visit: Payer: Self-pay

## 2020-02-15 VITALS — BP 130/87 | HR 67 | Ht 67.0 in | Wt 201.8 lb

## 2020-02-15 DIAGNOSIS — I480 Paroxysmal atrial fibrillation: Secondary | ICD-10-CM | POA: Diagnosis not present

## 2020-02-15 DIAGNOSIS — Z79899 Other long term (current) drug therapy: Secondary | ICD-10-CM

## 2020-02-15 DIAGNOSIS — E782 Mixed hyperlipidemia: Secondary | ICD-10-CM | POA: Diagnosis not present

## 2020-02-15 DIAGNOSIS — I2510 Atherosclerotic heart disease of native coronary artery without angina pectoris: Secondary | ICD-10-CM | POA: Diagnosis not present

## 2020-02-15 MED ORDER — ASPIRIN EC 325 MG PO TBEC: 325.0000 mg | DELAYED_RELEASE_TABLET | Freq: Every day | ORAL | 0 refills | Status: DC

## 2020-02-15 NOTE — Telephone Encounter (Signed)
Faxed referral for Phase II Cardiac Rehab to . °

## 2020-02-15 NOTE — Patient Instructions (Signed)
Medication Instructions:  Your physician has recommended you make the following change in your medication:  START: Aspirin 325 mg take one tablet by mouth daily for the next 90 days. *If you need a refill on your cardiac medications before your next appointment, please call your pharmacy*   Lab Work: Your physician recommends that you return for lab work in: TODAY CBC, CMP, TSH, T3, T4 If you have labs (blood work) drawn today and your tests are completely normal, you will receive your results only by: Marland Kitchen MyChart Message (if you have MyChart) OR . A paper copy in the mail If you have any lab test that is abnormal or we need to change your treatment, we will call you to review the results.   Testing/Procedures: None   Follow-Up: At [facility], you and your health needs are our priority.  As part of our continuing mission to provide you with exceptional heart care, we have created designated Provider Care Teams.  These Care Teams include your primary Cardiologist (physician) and Advanced Practice Providers (APPs -  Physician Assistants and Nurse Practitioners) who all work together to provide you with the care you need, when you need it.  We recommend signing up for the patient portal called "MyChart".  Sign up information is provided on this After Visit Summary.  MyChart is used to connect with patients for Virtual Visits (Telemedicine).  Patients are able to view lab/test results, encounter notes, upcoming appointments, etc.  Non-urgent messages can be sent to your provider as well.   To learn more about what you can do with MyChart, go to [email or web address].    Your next appointment:   6 week(s)  The format for your next appointment:   In Person  Provider:   Shirlee More, MD   Other Instructions

## 2020-02-15 NOTE — Progress Notes (Signed)
Cardiology Office Note:    Date:  02/15/2020   ID:  Jeffery Turner, DOB 06-30-1940, MRN [id number]  PCP:  Rochel Brome, MD  Cardiologist:  Shirlee More, MD    Referring MD: Rochel Brome, MD    ASSESSMENT:    1. Coronary artery disease involving native heart without angina pectoris, unspecified vessel or lesion type   2. Mixed hyperlipidemia   3. On amiodarone therapy   4. Paroxysmal atrial fibrillation (HCC)    PLAN:    In order of problems listed above:  1. Stable he is early postoperative put him to 25 mg aspirin for the first 3 months continue his current medications he is scheduled to see the surgery in follow-up.  I told him I think he is 1 to 2 weeks away from starting cardiac rehabilitation 2. Continue his high intensity statin 3. Continue amiodarone he will decrease from 800 to 400 mg/day as scheduled I will check labs including his thyroids CMP P and a CBC he is maintaining sinus rhythm   Next appointment: 4 to 6 weeks   Medication Adjustments/Labs and Tests Ordered: Current medicines are reviewed at length with the patient today.  Concerns regarding medicines are outlined above.  Orders Placed This Encounter  Procedures   EKG 12-Lead   No orders of the defined types were placed in this encounter.   Chief Complaint  Patient presents with   Follow-up    After coronary artery bypass surgery   Atrial Fibrillation    Is on amiodarone oral load    History of Present Illness:    Jeffery Turner is a 79 y.o. male with a hx of CAD having had PCI although all of his native vessels with PCI of the left circumflex 1997 subsequent PCI and stent LAD and right coronary artery January 2012.  Other problems include severe hyperlipidemia  He was last seen 01/17/2020 with unstable angina referred for coronary angiography.  Coronary angiography showed severe three-vessel CABG EF 50 to 55%.  Seen in consultation by cardiothoracic surgery and underwent CABG Dr. Gar Gibbon [facility] 02/05/2020 with a left thoracic artery anastomosis to left anterior descending saphenous vein graft to circumflex and a saphenous vein graft to the posterior descending artery.    Postoperatively he had atrial fibrillation treated with IV amiodarone with conversion to sinus rhythm and transition to oral amiodarone.  He was anemic postoperatively with a discharge hemoglobin of 9.4 treated with iron supplementation.   Compliance with diet, lifestyle and medications: Yes  He is now about 11 days postoperatively remains sore unable to get into bed he is using incentive spirometry up to 2000 cc minimal cough and sputum no wound drainage fever chills.  He has not had Covid immunization I asked him to go today and get [place].   Significant Diagnostic Studies: angiography:     Severe multivessel CAD including LEFT MAIN:  Ost LM lesion is 60% stenosed.  Mid LAD stent has diffuse 70% in-stent restenosis  -- Jailed 1st Diag-1 lesion is 50% stenosed. 1st Diag-2 lesion is 45% stenosed.  Prox Cx lesion is 40% stenosed.  Prox Cx to Mid Cx lesion is 75% stenosed leading up to prior stent.  Mid Cx to Dist Cx stent is 80% stenosed.  Dist Cx lesion is 70% stenosed with 50% stenosed side branch in 4th Mrg.  Prox RCA lesion is 65% stenosed.  Prox RCA to Mid RCA stent has diffuse 70% in-stent restenosis  Dist RCA lesion is 50%  stenosed with 50% stenosed side branch in RPDA. RPDA lesion is 100% stenosed.  -------------  The left ventricular systolic function is normal. The left ventricular ejection fraction is 50-55% by visual estimate --anterolateral as well as basal to mid inferior hypokinesis.  LV end diastolic pressure is normal.  Past Medical History:  Diagnosis Date   Anemia    Angina pectoris ([place]) 02/06/2018   Angina, class III ([place]) 02/06/2018   Anginal equivalent ([place]) 09/19/2019   CAD (coronary artery disease)    s/p stent to CFX 1997 2/2 MI s/p DES to [facility] and DES mLAD 07/14/10  2/2 [place] cath 07/14/10: dLAD 60%; CFX stent ok; OM1 30-40%; pRCA 30%; PDA 40%; EF 60%   Constant exophthalmos    right   Coronary artery disease of native artery of native heart with stable angina pectoris ([place]) 07/21/2010   Qualifier: Diagnosis of  By: Denny Peon, CMA, [name]     Epistaxis 07/21/2010   Qualifier: Diagnosis of  By: Jorene Minors, Scott     Hearing loss    right eye - no hearing aids   Hyperlipidemia    MI (myocardial infarction) ([place])    Mixed hyperlipidemia 07/21/2010   Qualifier: Diagnosis of  By: Orville Govern, CMA, Carol     Old myocardial infarction 07/21/2010   Qualifier: Diagnosis of  By: Orville Govern CMA, Carol     Osteoarthritis    OSTEOARTHRITIS 07/21/2010   Qualifier: Diagnosis of  By: Orville Govern CMA, Carol     S/P CABG x 3 02/05/2020   Shortness of breath 07/21/2010   Qualifier: Diagnosis of  By: Jorene Minors, Scott     TOBACCO USE, QUIT 07/21/2010   Qualifier: Diagnosis of  By: Orville Govern, CMA, Carol     Transient visual loss, right    wears glasses   UNSTABLE ANGINA 07/21/2010   Qualifier: Diagnosis of  By: Ronne Binning      Past Surgical History:  Procedure Laterality Date   CARDIAC CATHETERIZATION     CORONARY ARTERY BYPASS GRAFT N/A 02/05/2020   Procedure: CORONARY ARTERY BYPASS GRAFTING (CABG), ON PUMP, TIMES THREE, USING LEFT INTERNAL MAMMARY ARTERY TO LEFT ANTERIOR DESCENDING ARTERY, REVERSE SAPHENOUS VEIN GRAFT TO POSTERIOR DESCENDING ARTERY AND CIRCUMFLEX ARTERY;  Surgeon: Grace Isaac, MD;  Location: [place] [facility];  Service: Open Heart Surgery;  Laterality: N/A;   ENDOVEIN HARVEST OF GREATER SAPHENOUS VEIN Right 02/05/2020   Procedure: ENDOVEIN HARVEST OF GREATER SAPHENOUS VEIN;  Surgeon: Grace Isaac, MD;  Location: [place] [facility];  Service: Open Heart Surgery;  Laterality: Right;   LEFT HEART CATH AND CORONARY ANGIOGRAPHY N/A 01/25/2020   Procedure: LEFT HEART CATH AND CORONARY ANGIOGRAPHY;  Surgeon: Leonie Man, MD;  Location: [place] [facility];  Service:  Cardiovascular;  Laterality: N/A;   TEE WITHOUT CARDIOVERSION N/A 02/05/2020   Procedure: TRANSESOPHAGEAL ECHOCARDIOGRAM (TEE);  Surgeon: Grace Isaac, MD;  Location: [facility];  Service: Open Heart Surgery;  Laterality: N/A;   TONSILLECTOMY AND ADENOIDECTOMY     WISDOM TOOTH EXTRACTION      Current Medications: Current Meds  Medication Sig   acetaminophen (TYLENOL) 500 MG tablet Take 2 tablets (1,000 mg total) by mouth every 6 (six) hours as needed.   amiodarone (PACERONE) 200 MG tablet Take 2 tablets (400 mg total) by mouth 2 (two) times daily. X 7 days, then decrease to 200 mg BID x 7 days, then decrease to 200 mg daily   furosemide (LASIX) 40 MG tablet Take 1 tablet (40 mg total)  by mouth daily.   metoprolol tartrate (LOPRESSOR) 25 MG tablet Take 1 tablet (25 mg total) by mouth 2 (two) times daily.   mometasone-formoterol (DULERA) 100-5 MCG/ACT AERO Inhale 2 puffs into the lungs 2 (two) times daily.   nitroGLYCERIN (NITROSTAT) 0.4 MG SL tablet Place 1 tablet (0.4 mg total) under the tongue every 5 (five) minutes as needed. If require 2, recommend call 911. (Patient taking differently: Place 0.4 mg under the tongue every 5 (five) minutes as needed for chest pain. If require 2, recommend call 911.)   OVER THE COUNTER MEDICATION Place 1 drop into both eyes 3 (three) times daily as needed (eye irritation.). Colloidal Silver Eye Drops   potassium chloride SA (KLOR-CON) 20 MEQ tablet Take 1 tablet (20 mEq total) by mouth daily.   rosuvastatin (CRESTOR) 10 MG tablet Take 1 tablet (10 mg total) by mouth daily.   traMADol (ULTRAM) 50 MG tablet Take 1 tablet (50 mg total) by mouth every 4 (four) hours as needed for moderate pain.   [DISCONTINUED] aspirin (ASPIRIN 81) 81 MG EC tablet Take 1 tablet (81 mg total) by mouth daily. Swallow whole. (Patient taking differently: Take 81 mg by mouth daily at 8 pm. Swallow whole.)     Allergies:   Codeine and Statins   Social History    Socioeconomic History   Marital status: Married    Spouse name: Not on file   Number of children: Not on file   Years of education: Not on file   Highest education level: Not on file  Occupational History   Not on file  Tobacco Use   Smoking status: Former Smoker    Packs/day: 1.50    Years: 37.00    Pack years: 55.50    Types: Cigarettes    Quit date: 05/2000    Years since quitting: 19.7   Smokeless tobacco: Never Used  Scientific laboratory technician Use: Never used  Substance and Sexual Activity   Alcohol use: No   Drug use: No   Sexual activity: Not Currently  Other Topics Concern   Not on file  Social History Narrative   He lives in [place] with family.  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         Social Determinants of Health   Financial Resource Strain:    Difficulty of Paying Living Expenses: Not on file  Food Insecurity:    Worried About Charity fundraiser in the Last Year: Not on file   [employer] of Food in the Last Year: Not on file  Transportation Needs:    Lack of Transportation (Medical): Not on file   Lack of Transportation (Non-Medical): Not on file  Physical Activity:    Days of Exercise per Week: Not on file   Minutes of Exercise per Session: Not on file  Stress:    Feeling of Stress : Not on file  Social Connections:    Frequency of Communication with Friends and Family: Not on file   Frequency of Social Gatherings with Friends and Family: Not on file   Attends Religious Services: Not on file   Active Member of Clubs or Organizations: Not on file   Attends Archivist Meetings: Not on file   Marital Status: Not on file     Family History: The patient's family history includes  Alzheimer's disease in his mother; Coronary artery disease in an other family member; Heart failure in his mother. ROS:   Please see the  history of present illness.    All other systems reviewed and are negative.  EKGs/Labs/Other Studies Reviewed:    The following studies were reviewed today:  EKG:  EKG ordered today and personally reviewed.  The ekg ordered today demonstrates sinus rhythm first-degree AV block T wave inversion ischemic normal QT interval  Recent Labs: 01/17/2020: NT-Pro BNP 73 02/05/2020: ALT 16 02/08/2020: Magnesium 1.8; TSH 5.401 02/10/2020: BUN 23; Creatinine, Ser 0.97; Hemoglobin 9.4; Platelets 275; Potassium 4.1; Sodium 135  Recent Lipid Panel    Component Value Date/Time   CHOL 206 (H) 09/20/2019 0854   TRIG 135 09/20/2019 0854   HDL 33 (L) 09/20/2019 0854   CHOLHDL 6.2 (H) 09/20/2019 0854   CHOLHDL 4 04/08/2011 1202   VLDL 17.8 04/08/2011 1202   LDLCALC 148 (H) 09/20/2019 0854   LDLDIRECT 152.5 10/05/2010 0832    Physical Exam:    VS:  BP 130/87    Pulse 67    Ht 5\' 7"  (1.702 m)    Wt 201 lb 12.8 oz (91.5 kg)    SpO2 94%    BMI 31.61 kg/m     Wt Readings from Last 3 Encounters:  02/15/20 201 lb 12.8 oz (91.5 kg)  02/12/20 203 lb (92.1 kg)  02/01/20 206 lb 9.6 oz (93.7 kg)     GEN:  Well nourished, well developed in no acute distress HEENT: Normal NECK: No JVD; No carotid bruits LYMPHATICS: No lymphadenopathy CARDIAC: RRR, no murmurs, rubs, gallops sternum does not appear inflamed he has had no wound drainage RESPIRATORY:  Clear to auscultation without rales, wheezing or rhonchi  ABDOMEN: Soft, non-tender, non-distended MUSCULOSKELETAL: He has 1+ edema of the right lower extremity where he had saphenous vein graft harvested.  Edema; No deformity  SKIN: Warm and dry NEUROLOGIC:  Alert and oriented x 3 PSYCHIATRIC:  Normal affect    Signed, Shirlee More, MD  02/15/2020 10:31 AM    [place]

## 2020-02-16 LAB — COMPREHENSIVE METABOLIC PANEL
ALT: 22 IU/L (ref 0–44)
AST: 15 IU/L (ref 0–40)
Albumin/Globulin Ratio: 1.7 (ref 1.2–2.2)
Albumin: 3.8 g/dL (ref 3.7–4.7)
Alkaline Phosphatase: 64 IU/L (ref 48–121)
BUN/Creatinine Ratio: 28 — ABNORMAL HIGH (ref 10–24)
BUN: 28 mg/dL — ABNORMAL HIGH (ref 8–27)
Bilirubin Total: 0.3 mg/dL (ref 0.0–1.2)
CO2: 23 mmol/L (ref 20–29)
Calcium: 9.1 mg/dL (ref 8.6–10.2)
Chloride: 100 mmol/L (ref 96–106)
Creatinine, Ser: 0.99 mg/dL (ref 0.76–1.27)
GFR calc Af Amer: 84 mL/min/{1.73_m2} (ref >59)
GFR calc non Af Amer: 73 mL/min/{1.73_m2} (ref >59)
Globulin, Total: 2.2 g/dL (ref 1.5–4.5)
Glucose: 100 mg/dL — ABNORMAL HIGH (ref 65–99)
Potassium: 5.2 mmol/L (ref 3.5–5.2)
Sodium: 136 mmol/L (ref 134–144)
Total Protein: 6 g/dL (ref 6.0–8.5)

## 2020-02-16 LAB — CBC
Hematocrit: 31.4 % — ABNORMAL LOW (ref 37.5–51.0)
Hemoglobin: 10.7 g/dL — ABNORMAL LOW (ref 13.0–17.7)
MCH: 30.5 pg (ref 26.6–33.0)
MCHC: 34.1 g/dL (ref 31.5–35.7)
MCV: 90 fL (ref 79–97)
Platelets: 621 10*3/uL — ABNORMAL HIGH (ref 150–450)
RBC: 3.51 x10E6/uL — ABNORMAL LOW (ref 4.14–5.80)
RDW: 12.4 % (ref 11.6–15.4)
WBC: 10.2 10*3/uL (ref 3.4–10.8)

## 2020-02-16 LAB — TSH+T4F+T3FREE
Free T4: 1.47 ng/dL (ref 0.82–1.77)
T3, Free: 2.4 pg/mL (ref 2.0–4.4)
TSH: 7.61 u[IU]/mL — ABNORMAL HIGH (ref 0.450–4.500)

## 2020-02-19 ENCOUNTER — Telehealth: Payer: Self-pay

## 2020-02-19 ENCOUNTER — Other Ambulatory Visit: Payer: Self-pay

## 2020-02-19 NOTE — Telephone Encounter (Signed)
Spoke with patient regarding results and recommendation.  Patient verbalizes understanding and is agreeable to plan of care. Advised patient to call back with any issues or concerns.  

## 2020-02-19 NOTE — Telephone Encounter (Signed)
-----   Message from Berniece Salines, DO sent at 02/19/2020  8:11 AM EDT ----- Your blood work is remarkable for significantly elevated platelets 621.  9 days ago your platelets i was 275.  We will have to have this testing repeated.  This will need to be followed with a repeat blood work, I will send this information to your PCP requested repeat blood work by them as you may need to be referred to hematology.  Your TSH 7.6 with normal free T3 and free T4.  Kidney function electrolytes are within normal.  Your hemoglobin is at baseline.

## 2020-02-19 NOTE — Patient Outreach (Signed)
[facility]) Care Management  02/19/2020  Jeffery Turner [birth year] [id number]   EMMI- General Discharge RED ON EMMI ALERT Day # 4 Date: 02/17/20 Red Alert Reason:  Know who to call about changes in condition? No    Incoming call from patient. He reports doing good getting stronger everyday.  Discussed red alert. Patient reports he does not remember the call but states he knows which doctor to call for questions or changes. Patient reports having transportation to appointment and has follow ups scheduled.  Patient also states he has his medications and denies any question about medications.  Discussed with patient further nurse follow ups. Patient declined at this time.     Plan: RN CM will close case.    Jone Baseman, RN, MSN [facility] Care Management Care Management Coordinator Direct Line [phone] Toll Free: [phone]  Fax: [phone]

## 2020-02-19 NOTE — Telephone Encounter (Signed)
Left message on patients voicemail to please return our call.   

## 2020-02-21 ENCOUNTER — Other Ambulatory Visit: Payer: Self-pay

## 2020-02-21 ENCOUNTER — Other Ambulatory Visit: Payer: Medicare HMO

## 2020-02-21 DIAGNOSIS — R799 Abnormal finding of blood chemistry, unspecified: Secondary | ICD-10-CM | POA: Diagnosis not present

## 2020-02-22 ENCOUNTER — Ambulatory Visit: Payer: Self-pay

## 2020-02-22 LAB — CBC WITH DIFFERENTIAL/PLATELET
Basophils Absolute: 0.1 10*3/uL (ref 0.0–0.2)
Basos: 1 %
EOS (ABSOLUTE): 0.3 10*3/uL (ref 0.0–0.4)
Eos: 3 %
Hematocrit: 32 % — ABNORMAL LOW (ref 37.5–51.0)
Hemoglobin: 10.4 g/dL — ABNORMAL LOW (ref 13.0–17.7)
Immature Grans (Abs): 0.2 10*3/uL — ABNORMAL HIGH (ref 0.0–0.1)
Immature Granulocytes: 2 %
Lymphocytes Absolute: 1.5 10*3/uL (ref 0.7–3.1)
Lymphs: 17 %
MCH: 30.1 pg (ref 26.6–33.0)
MCHC: 32.5 g/dL (ref 31.5–35.7)
MCV: 93 fL (ref 79–97)
Monocytes Absolute: 1 10*3/uL — ABNORMAL HIGH (ref 0.1–0.9)
Monocytes: 12 %
Neutrophils Absolute: 5.7 10*3/uL (ref 1.4–7.0)
Neutrophils: 65 %
Platelets: 578 10*3/uL — ABNORMAL HIGH (ref 150–450)
RBC: 3.45 x10E6/uL — ABNORMAL LOW (ref 4.14–5.80)
RDW: 12.6 % (ref 11.6–15.4)
WBC: 8.8 10*3/uL (ref 3.4–10.8)

## 2020-02-26 ENCOUNTER — Telehealth: Payer: Self-pay | Admitting: Cardiology

## 2020-02-26 NOTE — Telephone Encounter (Signed)
Patient states that he is returning a call. I don't see any documentation on anyone calling him today.

## 2020-02-26 NOTE — Telephone Encounter (Signed)
Spoke to patient just now and let him know that the reason he was called was actually in regards to his wife. They have the same number on file.

## 2020-02-28 ENCOUNTER — Telehealth: Payer: Self-pay | Admitting: Cardiology

## 2020-02-28 NOTE — Telephone Encounter (Signed)
Pt c/o medication issue:  1. Name of Medication: metoprolol tartrate (LOPRESSOR) 25 MG tablet  2. How are you currently taking this medication (dosage and times per day)? 1 tablet by mouth two times daily   3. Are you having a reaction (difficulty breathing--STAT)? Yes   4. What is your medication issue? Jeffery Turner is calling stating he believes this medication is causing him excessive fatigue. He states he is supposed to be doing cardiac rehab, but feels there is no way he can because he is unable to even walk to the mailbox without becoming extremely exhausted. Please advise.

## 2020-02-28 NOTE — Telephone Encounter (Signed)
Reduce once daily in the evening should help him with his daytime fatigue

## 2020-02-28 NOTE — Telephone Encounter (Signed)
Spoke to patient and patients wife just now and let them know these recommendations. They verbalize understanding and thank me for the call back.

## 2020-03-03 ENCOUNTER — Ambulatory Visit: Payer: Medicare HMO | Admitting: Cardiology

## 2020-03-05 ENCOUNTER — Other Ambulatory Visit: Payer: Self-pay | Admitting: Cardiothoracic Surgery

## 2020-03-05 DIAGNOSIS — Z951 Presence of aortocoronary bypass graft: Secondary | ICD-10-CM

## 2020-03-06 ENCOUNTER — Other Ambulatory Visit: Payer: Self-pay

## 2020-03-06 ENCOUNTER — Ambulatory Visit
Admission: RE | Admit: 2020-03-06 | Discharge: 2020-03-06 | Disposition: A | Discharge status: Home / Self Care | Payer: Medicare HMO | Source: Ambulatory Visit | Attending: Cardiothoracic Surgery | Admitting: Cardiothoracic Surgery

## 2020-03-06 ENCOUNTER — Ambulatory Visit (INDEPENDENT_AMBULATORY_CARE_PROVIDER_SITE_OTHER): Payer: Self-pay | Admitting: Cardiothoracic Surgery

## 2020-03-06 VITALS — BP 105/68 | HR 65 | Temp 97.6°F | Resp 20 | Ht 67.0 in | Wt 204.0 lb

## 2020-03-06 DIAGNOSIS — R0602 Shortness of breath: Secondary | ICD-10-CM | POA: Diagnosis not present

## 2020-03-06 DIAGNOSIS — I2510 Atherosclerotic heart disease of native coronary artery without angina pectoris: Secondary | ICD-10-CM

## 2020-03-06 DIAGNOSIS — Z951 Presence of aortocoronary bypass graft: Secondary | ICD-10-CM

## 2020-03-06 NOTE — Progress Notes (Signed)
[address]       [place] [address]             [phone]      Trevione A Pleitez Pepin Medical Record #[id number] Date of Birth: 03-May-1941  Referring: Leonie Man, MD Primary Care: Rochel Brome, MD Primary Cardiologist: Shirlee More, MD   Chief Complaint:   POST OP FOLLOW UP OPERATIVE REPORT DATE OF PROCEDURE:  02/05/2020 PREOPERATIVE DIAGNOSIS:  New onset of angina with coronary occlusive disease. POSTOPERATIVE DIAGNOSIS:  New onset of angina with coronary occlusive disease. SURGICAL PROCEDURE:  Coronary artery bypass grafting x3 with the left internal mammary to the left anterior descending coronary artery, reverse saphenous vein graft to the circumflex coronary artery, reverse saphenous vein graft to the proximal posterior  descending coronary artery with right greater saphenous thigh and calf endoscopic vein harvesting  History of Present Illness:     Patient is slowly improving from recent coronary artery bypass grafting.  He has had no recurrent angina, he still has dyspnea on exertion.  Notes that he has had difficulty with shortness of breath chronically, with his severe underlying lung disease.  He enrolled in cardiac rehab yesterday to start in the near future.   Past Medical History:  Diagnosis Date  . Anemia   . Angina pectoris ([place]) 02/06/2018  . Angina, class III ([place]) 02/06/2018  . Anginal equivalent ([place]) 09/19/2019  . CAD (coronary artery disease)    s/p stent to CFX 1997 2/2 MI s/p DES to [facility] and DES mLAD 07/14/10 2/2 [place] cath 07/14/10: dLAD 60%; CFX stent ok; OM1 30-40%; pRCA 30%; PDA 40%; EF 60%  . Constant exophthalmos    right  . Coronary artery disease of native artery of native heart with stable angina pectoris ([place]) 07/21/2010   Qualifier: Diagnosis of  By: Denny Peon, CMA, [name]    . Epistaxis 07/21/2010   Qualifier: Diagnosis of  By: Jorene Minors, Scott    . Hearing loss    right eye - no hearing aids  . Hyperlipidemia    . MI (myocardial infarction) ([place])   . Mixed hyperlipidemia 07/21/2010   Qualifier: Diagnosis of  By: Orville Govern CMA, Arbie Cookey    . Old myocardial infarction 07/21/2010   Qualifier: Diagnosis of  By: Hester Mates, Carol    . Osteoarthritis   . OSTEOARTHRITIS 07/21/2010   Qualifier: Diagnosis of  By: Ronne Binning    . 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 Shortness of breath 07/21/2010   Qualifier: Diagnosis of  By: Jorene Minors, Scott    . TOBACCO USE, QUIT 07/21/2010   Qualifier: Diagnosis of  By: Orville Govern CMA, Arbie Cookey    . Transient visual loss, right    wears glasses  . UNSTABLE ANGINA 07/21/2010   Qualifier: Diagnosis of  By: Orville Govern CMA, Carol       Social History   Tobacco Use  Smoking Status Former Smoker  . Packs/day: 1.50  . Years: 37.00  . Pack years: 55.50  . Types: Cigarettes  . Quit date: 05/2000  . Years since quitting: 19.8  Smokeless Tobacco Never Used    Social History   Substance and Sexual Activity  Alcohol Use No     Allergies  Allergen Reactions  . Codeine Nausea And Vomiting  . Statins Other (See Comments)    Patient preference to not take statins/cholesterol meds    Current Outpatient Medications  Medication Sig Dispense Refill  . acetaminophen (TYLENOL)  500 MG tablet Take 2 tablets (1,000 mg total) by mouth every 6 (six) hours as needed. 30 tablet 0  . amiodarone (PACERONE) 200 MG tablet Take 2 tablets (400 mg total) by mouth 2 (two) times daily. X 7 days, then decrease to 200 mg BID x 7 days, then decrease to 200 mg daily 90 tablet 1  . aspirin EC 325 MG tablet Take 1 tablet (325 mg total) by mouth daily. 90 tablet 0  . Calcium-Magnesium-Zinc (CAL-MAG-ZINC PO) Take 1 tablet by mouth daily.     . COCONUT OIL PO Take 30 mLs by mouth daily.     . ferrous sulfate 325 (65 FE) MG tablet Take 325 mg by mouth once a week.     . furosemide (LASIX) 40 MG tablet Take 1 tablet (40 mg total) by mouth daily. 7 tablet 0  . Glucosamine-Chondroitin (COSAMIN DS PO) Take 1 tablet by  mouth daily.     . metoprolol tartrate (LOPRESSOR) 25 MG tablet Take 1 tablet (25 mg total) by mouth 2 (two) times daily. 60 tablet 3  . mometasone-formoterol (DULERA) 100-5 MCG/ACT AERO Inhale 2 puffs into the lungs 2 (two) times daily. 1 each 1  . Multiple Vitamin (MULTIVITAMIN WITH MINERALS) TABS tablet Take 1 tablet by mouth daily.     Marland Kitchen NATTOKINASE PO Take 1 capsule by mouth daily.     . nitroGLYCERIN (NITROSTAT) 0.4 MG SL tablet Place 1 tablet (0.4 mg total) under the tongue every 5 (five) minutes as needed. If require 2, recommend call 911. (Patient taking differently: Place 0.4 mg under the tongue every 5 (five) minutes as needed for chest pain. If require 2, recommend call 911.) 50 tablet 0  . OVER THE COUNTER MEDICATION Place 1 drop into both eyes 3 (three) times daily as needed (eye irritation.). Colloidal Silver Eye Drops    . potassium chloride SA (KLOR-CON) 20 MEQ tablet Take 1 tablet (20 mEq total) by mouth daily. 7 tablet 0  . rosuvastatin (CRESTOR) 10 MG tablet Take 1 tablet (10 mg total) by mouth daily. 30 tablet 3  . traMADol (ULTRAM) 50 MG tablet Take 1 tablet (50 mg total) by mouth every 4 (four) hours as needed for moderate pain. 30 tablet 0  . TURMERIC PO Take 1 capsule by mouth 3 (three) times daily as needed (inflammation).      No current facility-administered medications for this visit.       Physical Exam: BP 105/68   Pulse 65   Temp 97.6 F (36.4 C) (Skin)   Resp 20   Ht 5\' 7"  (1.702 m)   Wt 204 lb (92.5 kg)   SpO2 94% Comment: RA  BMI 31.95 kg/m   General appearance: alert, cooperative and no distress Neurologic: intact Heart: regular rate and rhythm, S1, S2 normal, no murmur, click, rub or gallop Lungs: diminished breath sounds bibasilar Abdomen: soft, non-tender; bowel sounds normal; no masses,  no organomegaly Extremities: extremities normal, atraumatic, no cyanosis or edema and Homans sign is negative, no sign of DVT Wound: Right vein harvest site  and sternal incisions are intact, bone appears stable   Diagnostic Studies & Laboratory data:     Recent Radiology Findings:   DG Chest 2 View  Result Date: 03/06/2020 CLINICAL DATA:  Shortness of breath and pain at incision site status post CABG. EXAM: CHEST - 2 VIEW COMPARISON:  None. FINDINGS: Status post median sternotomy and CABG. Persistent small left greater than right pleural effusions. No consult elation. No  evidence of pulmonary edema. IMPRESSION: Persistent small bilateral pleural effusions. No acute cardiopulmonary abnormality. Electronically Signed   By: Margaretha [place] MD   On: 03/06/2020 10:51    I have independently reviewed the above radiology studies  and reviewed the findings with the patient.    Recent Lab Findings: Lab Results  Component Value Date   WBC 8.8 02/21/2020   HGB 10.4 (L) 02/21/2020   HCT 32.0 (L) 02/21/2020   PLT 578 (H) 02/21/2020   GLUCOSE 100 (H) 02/15/2020   CHOL 206 (H) 09/20/2019   TRIG 135 09/20/2019   HDL 33 (L) 09/20/2019   LDLDIRECT 152.5 10/05/2010   LDLCALC 148 (H) 09/20/2019   ALT 22 02/15/2020   AST 15 02/15/2020   NA 136 02/15/2020   K 5.2 02/15/2020   CL 100 02/15/2020   CREATININE 0.99 02/15/2020   BUN 28 (H) 02/15/2020   CO2 23 02/15/2020   TSH 7.610 (H) 02/15/2020   INR 1.4 (H) 02/05/2020   HGBA1C 5.6 02/01/2020      Assessment / Plan:   Patient making stable progress following recent coronary artery bypass grafting. Continues on amiodarone for brief postop atrial fibrillation currently sinus Hyperlipidemia-continue high intensity statin Plan to see me back in 4 weeks Has enrolled to start cardiac rehab in the near future  Medication Changes: No orders of the defined types were placed in this encounter.     Grace Isaac MD      [address] [place], [address] Office [phone]     03/06/2020 12:16 PM

## 2020-03-12 DIAGNOSIS — E785 Hyperlipidemia, unspecified: Secondary | ICD-10-CM | POA: Diagnosis not present

## 2020-03-12 DIAGNOSIS — I25119 Atherosclerotic heart disease of native coronary artery with unspecified angina pectoris: Secondary | ICD-10-CM | POA: Diagnosis not present

## 2020-03-12 DIAGNOSIS — Z79899 Other long term (current) drug therapy: Secondary | ICD-10-CM | POA: Diagnosis not present

## 2020-03-12 DIAGNOSIS — I252 Old myocardial infarction: Secondary | ICD-10-CM | POA: Diagnosis not present

## 2020-03-12 DIAGNOSIS — Z7982 Long term (current) use of aspirin: Secondary | ICD-10-CM | POA: Diagnosis not present

## 2020-03-12 DIAGNOSIS — Z951 Presence of aortocoronary bypass graft: Secondary | ICD-10-CM | POA: Diagnosis not present

## 2020-03-14 DIAGNOSIS — Z7982 Long term (current) use of aspirin: Secondary | ICD-10-CM | POA: Diagnosis not present

## 2020-03-14 DIAGNOSIS — Z79899 Other long term (current) drug therapy: Secondary | ICD-10-CM | POA: Diagnosis not present

## 2020-03-14 DIAGNOSIS — I25119 Atherosclerotic heart disease of native coronary artery with unspecified angina pectoris: Secondary | ICD-10-CM | POA: Diagnosis not present

## 2020-03-14 DIAGNOSIS — I252 Old myocardial infarction: Secondary | ICD-10-CM | POA: Diagnosis not present

## 2020-03-14 DIAGNOSIS — Z951 Presence of aortocoronary bypass graft: Secondary | ICD-10-CM | POA: Diagnosis not present

## 2020-03-14 DIAGNOSIS — E785 Hyperlipidemia, unspecified: Secondary | ICD-10-CM | POA: Diagnosis not present

## 2020-03-17 DIAGNOSIS — I252 Old myocardial infarction: Secondary | ICD-10-CM | POA: Diagnosis not present

## 2020-03-17 DIAGNOSIS — I25119 Atherosclerotic heart disease of native coronary artery with unspecified angina pectoris: Secondary | ICD-10-CM | POA: Diagnosis not present

## 2020-03-17 DIAGNOSIS — E785 Hyperlipidemia, unspecified: Secondary | ICD-10-CM | POA: Diagnosis not present

## 2020-03-17 DIAGNOSIS — Z951 Presence of aortocoronary bypass graft: Secondary | ICD-10-CM | POA: Diagnosis not present

## 2020-03-17 DIAGNOSIS — Z7982 Long term (current) use of aspirin: Secondary | ICD-10-CM | POA: Diagnosis not present

## 2020-03-17 DIAGNOSIS — Z79899 Other long term (current) drug therapy: Secondary | ICD-10-CM | POA: Diagnosis not present

## 2020-03-19 DIAGNOSIS — I25119 Atherosclerotic heart disease of native coronary artery with unspecified angina pectoris: Secondary | ICD-10-CM | POA: Diagnosis not present

## 2020-03-19 DIAGNOSIS — Z7982 Long term (current) use of aspirin: Secondary | ICD-10-CM | POA: Diagnosis not present

## 2020-03-19 DIAGNOSIS — Z951 Presence of aortocoronary bypass graft: Secondary | ICD-10-CM | POA: Diagnosis not present

## 2020-03-19 DIAGNOSIS — I252 Old myocardial infarction: Secondary | ICD-10-CM | POA: Diagnosis not present

## 2020-03-19 DIAGNOSIS — Z79899 Other long term (current) drug therapy: Secondary | ICD-10-CM | POA: Diagnosis not present

## 2020-03-19 DIAGNOSIS — E785 Hyperlipidemia, unspecified: Secondary | ICD-10-CM | POA: Diagnosis not present

## 2020-03-21 DIAGNOSIS — I252 Old myocardial infarction: Secondary | ICD-10-CM | POA: Diagnosis not present

## 2020-03-21 DIAGNOSIS — Z7982 Long term (current) use of aspirin: Secondary | ICD-10-CM | POA: Diagnosis not present

## 2020-03-21 DIAGNOSIS — E785 Hyperlipidemia, unspecified: Secondary | ICD-10-CM | POA: Diagnosis not present

## 2020-03-21 DIAGNOSIS — Z951 Presence of aortocoronary bypass graft: Secondary | ICD-10-CM | POA: Diagnosis not present

## 2020-03-21 DIAGNOSIS — Z79899 Other long term (current) drug therapy: Secondary | ICD-10-CM | POA: Diagnosis not present

## 2020-03-21 DIAGNOSIS — I25119 Atherosclerotic heart disease of native coronary artery with unspecified angina pectoris: Secondary | ICD-10-CM | POA: Diagnosis not present

## 2020-03-24 DIAGNOSIS — I252 Old myocardial infarction: Secondary | ICD-10-CM | POA: Diagnosis not present

## 2020-03-24 DIAGNOSIS — E785 Hyperlipidemia, unspecified: Secondary | ICD-10-CM | POA: Diagnosis not present

## 2020-03-24 DIAGNOSIS — Z79899 Other long term (current) drug therapy: Secondary | ICD-10-CM | POA: Diagnosis not present

## 2020-03-24 DIAGNOSIS — I25119 Atherosclerotic heart disease of native coronary artery with unspecified angina pectoris: Secondary | ICD-10-CM | POA: Diagnosis not present

## 2020-03-24 DIAGNOSIS — Z7982 Long term (current) use of aspirin: Secondary | ICD-10-CM | POA: Diagnosis not present

## 2020-03-24 DIAGNOSIS — Z951 Presence of aortocoronary bypass graft: Secondary | ICD-10-CM | POA: Diagnosis not present

## 2020-03-26 DIAGNOSIS — I252 Old myocardial infarction: Secondary | ICD-10-CM | POA: Diagnosis not present

## 2020-03-26 DIAGNOSIS — Z79899 Other long term (current) drug therapy: Secondary | ICD-10-CM | POA: Diagnosis not present

## 2020-03-26 DIAGNOSIS — Z7982 Long term (current) use of aspirin: Secondary | ICD-10-CM | POA: Diagnosis not present

## 2020-03-26 DIAGNOSIS — Z951 Presence of aortocoronary bypass graft: Secondary | ICD-10-CM | POA: Diagnosis not present

## 2020-03-26 DIAGNOSIS — E785 Hyperlipidemia, unspecified: Secondary | ICD-10-CM | POA: Diagnosis not present

## 2020-03-26 DIAGNOSIS — I25119 Atherosclerotic heart disease of native coronary artery with unspecified angina pectoris: Secondary | ICD-10-CM | POA: Diagnosis not present

## 2020-03-27 NOTE — Progress Notes (Signed)
Cardiology Office Note:    Date:  03/28/2020   ID:  Jeffery Turner, DOB 10/18/1940, MRN [id number]  PCP:  Jeffery Brome, MD  Cardiologist:  Shirlee More, MD    Referring MD: Jeffery Brome, MD    ASSESSMENT:    1. Coronary artery disease involving native heart without angina pectoris, unspecified vessel or lesion type   2. Paroxysmal atrial fibrillation (HCC)   3. On amiodarone therapy   4. Mixed hyperlipidemia    PLAN:    In order of problems listed above:  1. He is improving after bypass surgery engaged in cardiac rehabilitation New [place] Heart Association class I having no angina on current treatment and continue aspirin 90 days drop to low-dose aspirin beta-blocker and lipid-lowering.  He has had exertional shortness of breath we will check a proBNP level today. 2. Stable no recurrence stop amiodarone 3. Continue statin check lipid profile   Next appointment: 3 months this is abdominal bulge   Medication Adjustments/Labs and Tests Ordered: Current medicines are reviewed at length with the patient today.  Concerns regarding medicines are outlined above.  No orders of the defined types were placed in this encounter.  No orders of the defined types were placed in this encounter.   Chief Complaint  Patient presents with  . Follow-up  . Coronary Artery Disease    History of Present Illness:    Jeffery Turner is a 79 y.o. male with a hx of CAD with PCI of all native vessels left circumflex 1997 LAD and right coronary artery January 2012 severe hyperlipidemia and unstable angina pectoris with coronary angiography showing severe three-vessel coronary artery disease EF 50 to 55% and CABG [facility] Dr. Pia Mau 02/05/2020.  Postoperatively he had atrial fibrillation treated with intravenous and subsequently oral amiodarone.  He was last seen 02/15/2020.  Compliance with diet, lifestyle and medications: Yes  He has slowly and steadily improved is engaged in cardiac  rehabilitation and is gaining strength and endurance.  No edema shortness of breath chest pain palpitation or syncope.  I reviewed the notes from CT surgery we will stop his amiodarone today.  Check EKG and then follow-up rhythm and cardiac rehabilitation.  He tolerates his statin without muscle pain or weakness check liver function lipid profile today.  Chest x-ray 01/18/2020 at [place] showed findings of atelectasis otherwise normal performed for shortness of breath. Past Medical History:  Diagnosis Date  . Anemia   . Angina pectoris ([place]) 02/06/2018  . Angina, class III ([place]) 02/06/2018  . Anginal equivalent ([place]) 09/19/2019  . CAD (coronary artery disease)    s/p stent to CFX 1997 2/2 MI s/p DES to [facility] and DES mLAD 07/14/10 2/2 [place] cath 07/14/10: dLAD 60%; CFX stent ok; OM1 30-40%; pRCA 30%; PDA 40%; EF 60%  . Constant exophthalmos    right  . Coronary artery disease of native artery of native heart with stable angina pectoris ([place]) 07/21/2010   Qualifier: Diagnosis of  By: Denny Peon, CMA, [name]    . Epistaxis 07/21/2010   Qualifier: Diagnosis of  By: Jorene Minors, Scott    . Hearing loss    right eye - no hearing aids  . Hyperlipidemia   . MI (myocardial infarction) ([place])   . Mixed hyperlipidemia 07/21/2010   Qualifier: Diagnosis of  By: Orville Govern CMA, Arbie Cookey    . Old myocardial infarction 07/21/2010   Qualifier: Diagnosis of  By: Hester Mates, Carol    . Osteoarthritis   . OSTEOARTHRITIS  07/21/2010   Qualifier: Diagnosis of  By: Ronne Binning    . 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 Shortness of breath 07/21/2010   Qualifier: Diagnosis of  By: Jorene Minors, Scott    . TOBACCO USE, QUIT 07/21/2010   Qualifier: Diagnosis of  By: Orville Govern CMA, Arbie Cookey    . Transient visual loss, right    wears glasses  . UNSTABLE ANGINA 07/21/2010   Qualifier: Diagnosis of  By: Ronne Binning      Past Surgical History:  Procedure Laterality Date  . CARDIAC CATHETERIZATION    . CORONARY ARTERY BYPASS GRAFT N/A  02/05/2020   Procedure: CORONARY ARTERY BYPASS GRAFTING (CABG), ON PUMP, TIMES THREE, USING LEFT INTERNAL MAMMARY ARTERY TO LEFT ANTERIOR DESCENDING ARTERY, REVERSE SAPHENOUS VEIN GRAFT TO POSTERIOR DESCENDING ARTERY AND CIRCUMFLEX ARTERY;  Surgeon: Grace Isaac, MD;  Location: [place];  Service: Open Heart Surgery;  Laterality: N/A;  . ENDOVEIN HARVEST OF GREATER SAPHENOUS VEIN Right 02/05/2020   Procedure: ENDOVEIN HARVEST OF GREATER SAPHENOUS VEIN;  Surgeon: Grace Isaac, MD;  Location: [place];  Service: Open Heart Surgery;  Laterality: Right;  . LEFT HEART CATH AND CORONARY ANGIOGRAPHY N/A 01/25/2020   Procedure: LEFT HEART CATH AND CORONARY ANGIOGRAPHY;  Surgeon: Leonie Man, MD;  Location: [facility];  Service: Cardiovascular;  Laterality: N/A;  . TEE WITHOUT CARDIOVERSION N/A 02/05/2020   Procedure: TRANSESOPHAGEAL ECHOCARDIOGRAM (TEE);  Surgeon: Grace Isaac, MD;  Location: [place];  Service: Open Heart Surgery;  Laterality: N/A;  . TONSILLECTOMY AND ADENOIDECTOMY    . WISDOM TOOTH EXTRACTION      Current Medications: Current Meds  Medication Sig  . amiodarone (PACERONE) 200 MG tablet Take 2 tablets (400 mg total) by mouth 2 (two) times daily. X 7 days, then decrease to 200 mg BID x 7 days, then decrease to 200 mg daily  . aspirin EC 325 MG tablet Take 1 tablet (325 mg total) by mouth daily.  . Calcium-Magnesium-Zinc (CAL-MAG-ZINC PO) Take 1 tablet by mouth daily.   . COCONUT OIL PO Take 30 mLs by mouth daily.   . Glucosamine-Chondroitin (COSAMIN DS PO) Take 1 tablet by mouth daily.   . metoprolol tartrate (LOPRESSOR) 25 MG tablet Take 1 tablet (25 mg total) by mouth 2 (two) times daily.  . mometasone-formoterol (DULERA) 100-5 MCG/ACT AERO Inhale 2 puffs into the lungs 2 (two) times daily.  . Multiple Vitamin (MULTIVITAMIN WITH MINERALS) TABS tablet Take 1 tablet by mouth daily.   Marland Kitchen NATTOKINASE PO Take 1 capsule by mouth daily.   . nitroGLYCERIN (NITROSTAT) 0.4  MG SL tablet Place 1 tablet (0.4 mg total) under the tongue every 5 (five) minutes as needed. If require 2, recommend call 911. (Patient taking differently: Place 0.4 mg under the tongue every 5 (five) minutes as needed for chest pain. If require 2, recommend call 911.)  . OVER THE COUNTER MEDICATION Place 1 drop into both eyes 3 (three) times daily as needed (eye irritation.). Colloidal Silver Eye Drops  . rosuvastatin (CRESTOR) 10 MG tablet Take 1 tablet (10 mg total) by mouth daily.  . TURMERIC PO Take 1 capsule by mouth 3 (three) times daily as needed (inflammation).      Allergies:   Codeine and Statins   Social History   Socioeconomic History  . Marital status: Married    Spouse name: Not on file  . Number of children: Not on file  . Years of education: Not on  file  . Highest education level: Not on file  Occupational History  . Not on file  Tobacco Use  . Smoking status: Former Smoker    Packs/day: 1.50    Years: 37.00    Pack years: 55.50    Types: Cigarettes    Quit date: 05/2000    Years since quitting: 19.8  . Smokeless tobacco: Never Used  Vaping Use  . Vaping Use: Never used  Substance and Sexual Activity  . Alcohol use: No  . Drug use: No  . Sexual activity: Not Currently  Other Topics Concern  . Not on file  Social History Narrative   He lives in [place] with family.  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         Social Determinants of Health   Financial Resource Strain:   . Difficulty of Paying Living Expenses: Not on file  Food Insecurity:   . Worried About Charity fundraiser in the Last Year: Not on file  . Ran Out of Food in the Last Year: Not on file  Transportation Needs:   . Lack of Transportation (Medical): Not on file  . Lack of Transportation (Non-Medical): Not on file  Physical Activity:   . Days of Exercise per Week: Not on file  .  Minutes of Exercise per Session: Not on file  Stress:   . Feeling of Stress : Not on file  Social Connections:   . Frequency of Communication with Friends and Family: Not on file  . Frequency of Social Gatherings with Friends and Family: Not on file  . Attends Religious Services: Not on file  . Active Member of Clubs or Organizations: Not on file  . Attends Archivist Meetings: Not on file  . Marital Status: Not on file     Family History: The patient's family history includes Alzheimer's disease in his mother; Coronary artery disease in an other family member; Heart failure in his mother. ROS:   Please see the history of present illness.    All other systems reviewed and are negative.  EKGs/Labs/Other Studies Reviewed:    The following studies were reviewed today:  EKG:  EKG ordered today and personally reviewed.  The ekg ordered today demonstrates sinus rhythm first-degree AV block nonspecific minor ST and T abnormality.  QT interval is normal  Recent Labs: 01/17/2020: NT-Pro BNP 73 02/08/2020: Magnesium 1.8 02/15/2020: ALT 22; BUN 28; Creatinine, Ser 0.99; Potassium 5.2; Sodium 136; TSH 7.610 02/21/2020: Hemoglobin 10.4; Platelets 578  Recent Lipid Panel    Component Value Date/Time   CHOL 206 (H) 09/20/2019 0854   TRIG 135 09/20/2019 0854   HDL 33 (L) 09/20/2019 0854   CHOLHDL 6.2 (H) 09/20/2019 0854   CHOLHDL 4 04/08/2011 1202   VLDL 17.8 04/08/2011 1202   LDLCALC 148 (H) 09/20/2019 0854   LDLDIRECT 152.5 10/05/2010 0832    Physical Exam:    VS:  BP 118/68   Pulse 66   Ht 5\' 7"  (1.702 m)   Wt 208 lb (94.3 kg)   SpO2 99%   BMI 32.58 kg/m     Wt Readings from Last 3 Encounters:  03/28/20 208 lb (94.3 kg)  03/06/20 204 lb (92.5 kg)  02/15/20 201 lb 12.8 oz (91.5 kg)     GEN:  Well nourished, well developed in no acute distress  HEENT: Normal NECK: No JVD; No carotid bruits LYMPHATICS: No lymphadenopathy CARDIAC: RRR, no murmurs, rubs,  gallops RESPIRATORY:  Clear to auscultation without rales, wheezing or rhonchi  ABDOMEN: Soft, non-tender, non-distended MUSCULOSKELETAL:  No edema; No deformity  SKIN: Warm and dry NEUROLOGIC:  Alert and oriented x 3 PSYCHIATRIC:  Normal affect    Signed, Shirlee More, MD  03/28/2020 10:14 AM    [place]

## 2020-03-28 ENCOUNTER — Other Ambulatory Visit: Payer: Self-pay

## 2020-03-28 ENCOUNTER — Ambulatory Visit: Payer: Medicare HMO | Admitting: Cardiology

## 2020-03-28 ENCOUNTER — Encounter: Payer: Self-pay | Admitting: Cardiology

## 2020-03-28 VITALS — BP 118/68 | HR 66 | Ht 67.0 in | Wt 208.0 lb

## 2020-03-28 DIAGNOSIS — I25119 Atherosclerotic heart disease of native coronary artery with unspecified angina pectoris: Secondary | ICD-10-CM | POA: Diagnosis not present

## 2020-03-28 DIAGNOSIS — R0602 Shortness of breath: Secondary | ICD-10-CM | POA: Diagnosis not present

## 2020-03-28 DIAGNOSIS — E785 Hyperlipidemia, unspecified: Secondary | ICD-10-CM | POA: Diagnosis not present

## 2020-03-28 DIAGNOSIS — I480 Paroxysmal atrial fibrillation: Secondary | ICD-10-CM

## 2020-03-28 DIAGNOSIS — I252 Old myocardial infarction: Secondary | ICD-10-CM | POA: Diagnosis not present

## 2020-03-28 DIAGNOSIS — Z951 Presence of aortocoronary bypass graft: Secondary | ICD-10-CM | POA: Diagnosis not present

## 2020-03-28 DIAGNOSIS — Z79899 Other long term (current) drug therapy: Secondary | ICD-10-CM | POA: Diagnosis not present

## 2020-03-28 DIAGNOSIS — I2510 Atherosclerotic heart disease of native coronary artery without angina pectoris: Secondary | ICD-10-CM

## 2020-03-28 DIAGNOSIS — E782 Mixed hyperlipidemia: Secondary | ICD-10-CM

## 2020-03-28 DIAGNOSIS — Z7982 Long term (current) use of aspirin: Secondary | ICD-10-CM | POA: Diagnosis not present

## 2020-03-28 NOTE — Patient Instructions (Addendum)
Medication Instructions:  Your physician has recommended you make the following change in your medication:  STOP: Amiodarone On 05/07/2020 please reduce your aspirin to 81 mg daily.   *If you need a refill on your cardiac medications before your next appointment, please call your pharmacy*   Lab Work: Your physician recommends that you return for lab work in: TODAY CMP, Lipids, ProBNP If you have labs (blood work) drawn today and your tests are completely normal, you will receive your results only by: Marland Kitchen MyChart Message (if you have MyChart) OR . A paper copy in the mail If you have any lab test that is abnormal or we need to change your treatment, we will call you to review the results.   Testing/Procedures: None   Follow-Up: At [facility], you and your health needs are our priority.  As part of our continuing mission to provide you with exceptional heart care, we have created designated Provider Care Teams.  These Care Teams include your primary Cardiologist (physician) and Advanced Practice Providers (APPs -  Physician Assistants and Nurse Practitioners) who all work together to provide you with the care you need, when you need it.  We recommend signing up for the patient portal called "MyChart".  Sign up information is provided on this After Visit Summary.  MyChart is used to connect with patients for Virtual Visits (Telemedicine).  Patients are able to view lab/test results, encounter notes, upcoming appointments, etc.  Non-urgent messages can be sent to your provider as well.   To learn more about what you can do with MyChart, go to [email or web address].    Your next appointment:   3 month(s)  The format for your next appointment:   In Person  Provider:   Shirlee More, MD   Other Instructions

## 2020-03-29 LAB — COMPREHENSIVE METABOLIC PANEL
ALT: 15 IU/L (ref 0–44)
AST: 16 IU/L (ref 0–40)
Albumin/Globulin Ratio: 1.6 (ref 1.2–2.2)
Albumin: 4.3 g/dL (ref 3.7–4.7)
Alkaline Phosphatase: 60 IU/L (ref 44–121)
BUN/Creatinine Ratio: 15 (ref 10–24)
BUN: 19 mg/dL (ref 8–27)
Bilirubin Total: 0.3 mg/dL (ref 0.0–1.2)
CO2: 22 mmol/L (ref 20–29)
Calcium: 9.3 mg/dL (ref 8.6–10.2)
Chloride: 98 mmol/L (ref 96–106)
Creatinine, Ser: 1.25 mg/dL (ref 0.76–1.27)
GFR calc Af Amer: 63 mL/min/{1.73_m2} (ref >59)
GFR calc non Af Amer: 55 mL/min/{1.73_m2} — ABNORMAL LOW (ref >59)
Globulin, Total: 2.7 g/dL (ref 1.5–4.5)
Glucose: 117 mg/dL — ABNORMAL HIGH (ref 65–99)
Potassium: 4.5 mmol/L (ref 3.5–5.2)
Sodium: 135 mmol/L (ref 134–144)
Total Protein: 7 g/dL (ref 6.0–8.5)

## 2020-03-29 LAB — LIPID PANEL
Chol/HDL Ratio: 3.1 ratio (ref 0.0–5.0)
Cholesterol, Total: 124 mg/dL (ref 100–199)
HDL: 40 mg/dL (ref >39)
LDL Chol Calc (NIH): 56 mg/dL (ref 0–99)
Triglycerides: 163 mg/dL — ABNORMAL HIGH (ref 0–149)
VLDL Cholesterol Cal: 28 mg/dL (ref 5–40)

## 2020-03-29 LAB — PRO B NATRIURETIC PEPTIDE: NT-Pro BNP: 163 pg/mL (ref 0–486)

## 2020-03-31 ENCOUNTER — Telehealth: Payer: Self-pay

## 2020-03-31 DIAGNOSIS — I25119 Atherosclerotic heart disease of native coronary artery with unspecified angina pectoris: Secondary | ICD-10-CM | POA: Diagnosis not present

## 2020-03-31 DIAGNOSIS — Z7982 Long term (current) use of aspirin: Secondary | ICD-10-CM | POA: Diagnosis not present

## 2020-03-31 DIAGNOSIS — Z951 Presence of aortocoronary bypass graft: Secondary | ICD-10-CM | POA: Diagnosis not present

## 2020-03-31 DIAGNOSIS — I252 Old myocardial infarction: Secondary | ICD-10-CM | POA: Diagnosis not present

## 2020-03-31 DIAGNOSIS — Z79899 Other long term (current) drug therapy: Secondary | ICD-10-CM | POA: Diagnosis not present

## 2020-03-31 DIAGNOSIS — E785 Hyperlipidemia, unspecified: Secondary | ICD-10-CM | POA: Diagnosis not present

## 2020-03-31 NOTE — Telephone Encounter (Signed)
Left message on patients voicemail to please return our call.   

## 2020-03-31 NOTE — Telephone Encounter (Signed)
Patient is returning call.  °

## 2020-03-31 NOTE — Telephone Encounter (Signed)
Pt/ wife verbalized understanding of his lab results.

## 2020-03-31 NOTE — Telephone Encounter (Signed)
-----   Message from Richardo Priest, MD sent at 03/29/2020  2:50 PM EDT ----- Good result on target no changes

## 2020-04-02 DIAGNOSIS — E785 Hyperlipidemia, unspecified: Secondary | ICD-10-CM | POA: Diagnosis not present

## 2020-04-02 DIAGNOSIS — I252 Old myocardial infarction: Secondary | ICD-10-CM | POA: Diagnosis not present

## 2020-04-02 DIAGNOSIS — Z951 Presence of aortocoronary bypass graft: Secondary | ICD-10-CM | POA: Diagnosis not present

## 2020-04-02 DIAGNOSIS — Z79899 Other long term (current) drug therapy: Secondary | ICD-10-CM | POA: Diagnosis not present

## 2020-04-02 DIAGNOSIS — Z7982 Long term (current) use of aspirin: Secondary | ICD-10-CM | POA: Diagnosis not present

## 2020-04-02 DIAGNOSIS — I25119 Atherosclerotic heart disease of native coronary artery with unspecified angina pectoris: Secondary | ICD-10-CM | POA: Diagnosis not present

## 2020-04-03 ENCOUNTER — Ambulatory Visit (INDEPENDENT_AMBULATORY_CARE_PROVIDER_SITE_OTHER): Payer: Self-pay | Admitting: Cardiothoracic Surgery

## 2020-04-03 ENCOUNTER — Other Ambulatory Visit: Payer: Self-pay

## 2020-04-03 VITALS — BP 121/69 | HR 59 | Temp 97.6°F | Resp 20 | Ht 67.0 in | Wt 207.0 lb

## 2020-04-03 DIAGNOSIS — I2510 Atherosclerotic heart disease of native coronary artery without angina pectoris: Secondary | ICD-10-CM

## 2020-04-03 DIAGNOSIS — Z951 Presence of aortocoronary bypass graft: Secondary | ICD-10-CM

## 2020-04-03 NOTE — Progress Notes (Signed)
[address]       [place] [address]             [phone]      Ammaar A Lampert Govan Medical Record #[id number] Date of Birth: 1941-05-23  Referring: Leonie Man, MD Primary Care: Rochel Brome, MD Primary Cardiologist: Shirlee More, MD   Chief Complaint:   POST OP FOLLOW UP OPERATIVE REPORT DATE OF PROCEDURE:  02/05/2020 PREOPERATIVE DIAGNOSIS:  New onset of angina with coronary occlusive disease. POSTOPERATIVE DIAGNOSIS:  New onset of angina with coronary occlusive disease. SURGICAL PROCEDURE:  Coronary artery bypass grafting x3 with the left internal mammary to the left anterior descending coronary artery, reverse saphenous vein graft to the circumflex coronary artery, reverse saphenous vein graft to the proximal posterior  descending coronary artery with right greater saphenous thigh and calf endoscopic vein harvesting  History of Present Illness:     Patient is slowly improving from recent coronary artery bypass grafting.  He has had no recurrent angina, he has been progressing and cardiac rehab.  He does have dyspnea on exertion  He does have some memory issues, he notes that he does not remember his previous visit to the office here postop.  His wife notes that he falls asleep frequently, this may be related to urinary frequency at night.  He notes 5 or 6 times a night he has to get up to urinate. Past Medical History:  Diagnosis Date  . Anemia   . Angina pectoris ([place]) 02/06/2018  . Angina, class III ([place]) 02/06/2018  . Anginal equivalent ([place]) 09/19/2019  . CAD (coronary artery disease)    s/p stent to CFX 1997 2/2 MI s/p DES to [facility] and DES mLAD 07/14/10 2/2 [place] cath 07/14/10: dLAD 60%; CFX stent ok; OM1 30-40%; pRCA 30%; PDA 40%; EF 60%  . Constant exophthalmos    right  . Coronary artery disease of native artery of native heart with stable angina pectoris ([place]) 07/21/2010   Qualifier: Diagnosis of  By: Denny Peon, CMA, [name]    .  Epistaxis 07/21/2010   Qualifier: Diagnosis of  By: Jorene Minors, Scott    . Hearing loss    right eye - no hearing aids  . Hyperlipidemia   . MI (myocardial infarction) ([place])   . Mixed hyperlipidemia 07/21/2010   Qualifier: Diagnosis of  By: Orville Govern CMA, Arbie Cookey    . Old myocardial infarction 07/21/2010   Qualifier: Diagnosis of  By: Hester Mates, Carol    . Osteoarthritis   . OSTEOARTHRITIS 07/21/2010   Qualifier: Diagnosis of  By: Ronne Binning    . 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 Shortness of breath 07/21/2010   Qualifier: Diagnosis of  By: Jorene Minors, Scott    . TOBACCO USE, QUIT 07/21/2010   Qualifier: Diagnosis of  By: Orville Govern CMA, Arbie Cookey    . Transient visual loss, right    wears glasses  . UNSTABLE ANGINA 07/21/2010   Qualifier: Diagnosis of  By: Orville Govern CMA, Carol       Social History   Tobacco Use  Smoking Status Former Smoker  . Packs/day: 1.50  . Years: 37.00  . Pack years: 55.50  . Types: Cigarettes  . Quit date: 05/2000  . Years since quitting: 19.9  Smokeless Tobacco Never Used    Social History   Substance and Sexual Activity  Alcohol Use No     Allergies  Allergen Reactions  . Codeine Nausea And Vomiting  .  Statins Other (See Comments)    Patient preference to not take statins/cholesterol meds    Current Outpatient Medications  Medication Sig Dispense Refill  . aspirin EC 325 MG tablet Take 1 tablet (325 mg total) by mouth daily. 90 tablet 0  . Calcium-Magnesium-Zinc (CAL-MAG-ZINC PO) Take 1 tablet by mouth daily.     . COCONUT OIL PO Take 30 mLs by mouth daily.     . Glucosamine-Chondroitin (COSAMIN DS PO) Take 1 tablet by mouth daily.     . metoprolol tartrate (LOPRESSOR) 25 MG tablet Take 1 tablet (25 mg total) by mouth 2 (two) times daily. 60 tablet 3  . mometasone-formoterol (DULERA) 100-5 MCG/ACT AERO Inhale 2 puffs into the lungs 2 (two) times daily. 1 each 1  . Multiple Vitamin (MULTIVITAMIN WITH MINERALS) TABS tablet Take 1 tablet by mouth daily.     Marland Kitchen  NATTOKINASE PO Take 1 capsule by mouth daily.     . nitroGLYCERIN (NITROSTAT) 0.4 MG SL tablet Place 1 tablet (0.4 mg total) under the tongue every 5 (five) minutes as needed. If require 2, recommend call 911. (Patient taking differently: Place 0.4 mg under the tongue every 5 (five) minutes as needed for chest pain. If require 2, recommend call 911.) 50 tablet 0  . OVER THE COUNTER MEDICATION Place 1 drop into both eyes 3 (three) times daily as needed (eye irritation.). Colloidal Silver Eye Drops    . rosuvastatin (CRESTOR) 10 MG tablet Take 1 tablet (10 mg total) by mouth daily. 30 tablet 3  . TURMERIC PO Take 1 capsule by mouth 3 (three) times daily as needed (inflammation).      No current facility-administered medications for this visit.       Physical Exam: BP 121/69   Pulse (!) 59   Temp 97.6 F (36.4 C) (Skin)   Resp 20   Ht 5\' 7"  (1.702 m)   Wt 207 lb (93.9 kg)   SpO2 93% Comment: RA  BMI 32.42 kg/m  General appearance: alert, cooperative and no distress Neck: no adenopathy, no carotid bruit, no JVD, supple, symmetrical, trachea midline and thyroid not enlarged, symmetric, no tenderness/mass/nodules Lymph nodes: Cervical, supraclavicular, and axillary nodes normal. Resp: diminished breath sounds bilaterally Cardio: regular rate and rhythm, S1, S2 normal, no murmur, click, rub or gallop GI: soft, non-tender; bowel sounds normal; no masses,  no organomegaly Extremities: extremities normal, atraumatic, no cyanosis or edema and Homans sign is negative, no sign of DVT Neurologic: Grossly normal Patient sternum is stable and well-healed there is no popping or clicking or sternal instability noted Vein harvest sites healed well  Diagnostic Studies & Laboratory data:     Recent Radiology Findings:   none  Recent Lab Findings: Lab Results  Component Value Date   WBC 8.8 02/21/2020   HGB 10.4 (L) 02/21/2020   HCT 32.0 (L) 02/21/2020   PLT 578 (H) 02/21/2020   GLUCOSE 117  (H) 03/28/2020   CHOL 124 03/28/2020   TRIG 163 (H) 03/28/2020   HDL 40 03/28/2020   LDLDIRECT 152.5 10/05/2010   LDLCALC 56 03/28/2020   ALT 15 03/28/2020   AST 16 03/28/2020   NA 135 03/28/2020   K 4.5 03/28/2020   CL 98 03/28/2020   CREATININE 1.25 03/28/2020   BUN 19 03/28/2020   CO2 22 03/28/2020   TSH 7.610 (H) 02/15/2020   INR 1.4 (H) 02/05/2020   HGBA1C 5.6 02/01/2020      Assessment / Plan:   #1 status post  coronary artery bypass grafting-currently enrolled in cardiac rehab.  Making good progress postoperatively without recurrent angina. #2 underlying pulmonary disease-notes some dyspnea on exertion #3 memory issues-patient will discuss further work-up of this with primary care #4 nocturia-patient will discuss with primary care his urinary frequency at night and consider urology consultation and possible use of Flomax if appropriate.   Patient will continue with cardiology follow-up with Dr. Bettina Gavia     Medication Changes: No orders of the defined types were placed in this encounter.     Grace Isaac MD      [place] [place] [address] Office [phone]     04/03/2020 9:47 AM

## 2020-04-04 DIAGNOSIS — I252 Old myocardial infarction: Secondary | ICD-10-CM | POA: Diagnosis not present

## 2020-04-04 DIAGNOSIS — I25119 Atherosclerotic heart disease of native coronary artery with unspecified angina pectoris: Secondary | ICD-10-CM | POA: Diagnosis not present

## 2020-04-04 DIAGNOSIS — Z951 Presence of aortocoronary bypass graft: Secondary | ICD-10-CM | POA: Diagnosis not present

## 2020-04-04 DIAGNOSIS — Z79899 Other long term (current) drug therapy: Secondary | ICD-10-CM | POA: Diagnosis not present

## 2020-04-04 DIAGNOSIS — Z7982 Long term (current) use of aspirin: Secondary | ICD-10-CM | POA: Diagnosis not present

## 2020-04-04 DIAGNOSIS — E785 Hyperlipidemia, unspecified: Secondary | ICD-10-CM | POA: Diagnosis not present

## 2020-04-05 NOTE — Progress Notes (Signed)
Patient needs hospital follow up please. Also needs toc. Kc

## 2020-04-07 DIAGNOSIS — I252 Old myocardial infarction: Secondary | ICD-10-CM | POA: Diagnosis not present

## 2020-04-07 DIAGNOSIS — Z79899 Other long term (current) drug therapy: Secondary | ICD-10-CM | POA: Diagnosis not present

## 2020-04-07 DIAGNOSIS — I25119 Atherosclerotic heart disease of native coronary artery with unspecified angina pectoris: Secondary | ICD-10-CM | POA: Diagnosis not present

## 2020-04-07 DIAGNOSIS — E785 Hyperlipidemia, unspecified: Secondary | ICD-10-CM | POA: Diagnosis not present

## 2020-04-07 DIAGNOSIS — Z951 Presence of aortocoronary bypass graft: Secondary | ICD-10-CM | POA: Diagnosis not present

## 2020-04-07 DIAGNOSIS — Z7982 Long term (current) use of aspirin: Secondary | ICD-10-CM | POA: Diagnosis not present

## 2020-04-09 DIAGNOSIS — Z79899 Other long term (current) drug therapy: Secondary | ICD-10-CM | POA: Diagnosis not present

## 2020-04-09 DIAGNOSIS — E785 Hyperlipidemia, unspecified: Secondary | ICD-10-CM | POA: Diagnosis not present

## 2020-04-09 DIAGNOSIS — I25119 Atherosclerotic heart disease of native coronary artery with unspecified angina pectoris: Secondary | ICD-10-CM | POA: Diagnosis not present

## 2020-04-09 DIAGNOSIS — I252 Old myocardial infarction: Secondary | ICD-10-CM | POA: Diagnosis not present

## 2020-04-09 DIAGNOSIS — Z7982 Long term (current) use of aspirin: Secondary | ICD-10-CM | POA: Diagnosis not present

## 2020-04-09 DIAGNOSIS — Z951 Presence of aortocoronary bypass graft: Secondary | ICD-10-CM | POA: Diagnosis not present

## 2020-04-10 ENCOUNTER — Encounter: Payer: Self-pay | Admitting: Family Medicine

## 2020-04-10 ENCOUNTER — Other Ambulatory Visit: Payer: Self-pay

## 2020-04-10 ENCOUNTER — Ambulatory Visit (INDEPENDENT_AMBULATORY_CARE_PROVIDER_SITE_OTHER): Payer: Medicare HMO | Admitting: Family Medicine

## 2020-04-10 VITALS — BP 112/60 | HR 70 | Temp 97.3°F | Ht 67.0 in | Wt 210.0 lb

## 2020-04-10 DIAGNOSIS — I25118 Atherosclerotic heart disease of native coronary artery with other forms of angina pectoris: Secondary | ICD-10-CM

## 2020-04-10 DIAGNOSIS — E6609 Other obesity due to excess calories: Secondary | ICD-10-CM

## 2020-04-10 DIAGNOSIS — I2510 Atherosclerotic heart disease of native coronary artery without angina pectoris: Secondary | ICD-10-CM

## 2020-04-10 DIAGNOSIS — I252 Old myocardial infarction: Secondary | ICD-10-CM

## 2020-04-10 DIAGNOSIS — R739 Hyperglycemia, unspecified: Secondary | ICD-10-CM

## 2020-04-10 DIAGNOSIS — E782 Mixed hyperlipidemia: Secondary | ICD-10-CM | POA: Diagnosis not present

## 2020-04-10 DIAGNOSIS — Z6832 Body mass index (BMI) 32.0-32.9, adult: Secondary | ICD-10-CM

## 2020-04-10 NOTE — Patient Instructions (Signed)
Mederma ointment over scar may help.

## 2020-04-10 NOTE — Progress Notes (Signed)
Subjective:  Patient ID: Jeffery Turner, male    DOB: 1940-08-07  Age: 79 y.o. MRN: [id number]  Chief Complaint  Patient presents with  . Coronary Artery Disease    HPI  Patient presents for a hospital follow up. Patient underwent a coronary artery bypass grafting x3 with the left internal mammary to the left anterior descending coronary artery, reverse saphenous vein graft to the circumflex coronary artery, reverse saphenous vein graft to the proximal posteriordescending coronary artery with right greater saphenous thigh and calf endoscopic vein harvesting on August 24. Since then he has followed up with Dr. Bettina Gavia and Dr. Servando Snare. He is currently in cardiopulmonary rehab.  He feels this is going very well.  He does feel very fatigued and thought that he would recover more quickly than he has.  Patient is currently on aspirin 325 mg once daily, metoprolol 25 mg twice daily, rosuvastatin 10 mg once daily.  He does have nitroglycerin on hand although has not had to use it.  Denies having any chest pain since his bypass surgery.  Hyperlipidemia: Takes Crestor 10 mg once daily.  Does try to eat eat very healthy.  He does exercise.  Patient has asthma.  He is on Dulera 2 puffs twice daily.  Patient is overweight, with BMI of 32.  Again as stated above he is eating healthy and exercising regularly. Current Outpatient Medications on File Prior to Visit  Medication Sig Dispense Refill  . aspirin EC 325 MG tablet Take 1 tablet (325 mg total) by mouth daily. 90 tablet 0  . Calcium-Magnesium-Zinc (CAL-MAG-ZINC PO) Take 1 tablet by mouth daily.     . COCONUT OIL PO Take 30 mLs by mouth daily.     . metoprolol tartrate (LOPRESSOR) 25 MG tablet Take 1 tablet (25 mg total) by mouth 2 (two) times daily. 60 tablet 3  . mometasone-formoterol (DULERA) 100-5 MCG/ACT AERO Inhale 2 puffs into the lungs 2 (two) times daily. 1 each 1  . Multiple Vitamin (MULTIVITAMIN WITH MINERALS) TABS tablet Take 1 tablet  by mouth daily.     . nitroGLYCERIN (NITROSTAT) 0.4 MG SL tablet Place 1 tablet (0.4 mg total) under the tongue every 5 (five) minutes as needed. If require 2, recommend call 911. (Patient taking differently: Place 0.4 mg under the tongue every 5 (five) minutes as needed for chest pain. If require 2, recommend call 911.) 50 tablet 0  . OVER THE COUNTER MEDICATION Place 1 drop into both eyes 3 (three) times daily as needed (eye irritation.). Colloidal Silver Eye Drops    . rosuvastatin (CRESTOR) 10 MG tablet Take 1 tablet (10 mg total) by mouth daily. 30 tablet 3   No current facility-administered medications on file prior to visit.   Past Medical History:  Diagnosis Date  . Anemia   . Angina pectoris ([place]) 02/06/2018  . Angina, class III ([place]) 02/06/2018  . Anginal equivalent ([place]) 09/19/2019  . CAD (coronary artery disease)    s/p stent to CFX 1997 2/2 MI s/p DES to [facility] and DES mLAD 07/14/10 2/2 [place] cath 07/14/10: dLAD 60%; CFX stent ok; OM1 30-40%; pRCA 30%; PDA 40%; EF 60%  . Constant exophthalmos    right  . Coronary artery disease of native artery of native heart with stable angina pectoris ([place]) 07/21/2010   Qualifier: Diagnosis of  By: Denny Peon, CMA, [name]    . Epistaxis 07/21/2010   Qualifier: Diagnosis of  By: Jorene Minors, Scott    . Hearing loss  right eye - no hearing aids  . Hyperlipidemia   . MI (myocardial infarction) ([place])   . Mixed hyperlipidemia 07/21/2010   Qualifier: Diagnosis of  By: Orville Govern CMA, Arbie Cookey    . Old myocardial infarction 07/21/2010   Qualifier: Diagnosis of  By: Hester Mates, Carol    . Osteoarthritis   . OSTEOARTHRITIS 07/21/2010   Qualifier: Diagnosis of  By: Ronne Binning    . 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 Shortness of breath 07/21/2010   Qualifier: Diagnosis of  By: Jorene Minors, Scott    . TOBACCO USE, QUIT 07/21/2010   Qualifier: Diagnosis of  By: Orville Govern CMA, Arbie Cookey    . Transient visual loss, right    wears glasses  . UNSTABLE ANGINA 07/21/2010   Qualifier:  Diagnosis of  By: Ronne Binning     Past Surgical History:  Procedure Laterality Date  . CARDIAC CATHETERIZATION    . CORONARY ARTERY BYPASS GRAFT N/A 02/05/2020   Procedure: CORONARY ARTERY BYPASS GRAFTING (CABG), ON PUMP, TIMES THREE, USING LEFT INTERNAL MAMMARY ARTERY TO LEFT ANTERIOR DESCENDING ARTERY, REVERSE SAPHENOUS VEIN GRAFT TO POSTERIOR DESCENDING ARTERY AND CIRCUMFLEX ARTERY;  Surgeon: Grace Isaac, MD;  Location: [place];  Service: Open Heart Surgery;  Laterality: N/A;  . ENDOVEIN HARVEST OF GREATER SAPHENOUS VEIN Right 02/05/2020   Procedure: ENDOVEIN HARVEST OF GREATER SAPHENOUS VEIN;  Surgeon: Grace Isaac, MD;  Location: [place];  Service: Open Heart Surgery;  Laterality: Right;  . LEFT HEART CATH AND CORONARY ANGIOGRAPHY N/A 01/25/2020   Procedure: LEFT HEART CATH AND CORONARY ANGIOGRAPHY;  Surgeon: Leonie Man, MD;  Location: [place] [facility];  Service: Cardiovascular;  Laterality: N/A;  . TEE WITHOUT CARDIOVERSION N/A 02/05/2020   Procedure: TRANSESOPHAGEAL ECHOCARDIOGRAM (TEE);  Surgeon: Grace Isaac, MD;  Location: [place];  Service: Open Heart Surgery;  Laterality: N/A;  . TONSILLECTOMY AND ADENOIDECTOMY    . WISDOM TOOTH EXTRACTION      Family History  Problem Relation Age of Onset  . Coronary artery disease Other   . Heart failure Mother   . Alzheimer's disease Mother    Social History   Socioeconomic History  . Marital status: Married    Spouse name: Not on file  . Number of children: Not on file  . Years of education: Not on file  . Highest education level: Not on file  Occupational History  . Not on file  Tobacco Use  . Smoking status: Former Smoker    Packs/day: 1.50    Years: 37.00    Pack years: 55.50    Types: Cigarettes    Quit date: 05/2000    Years since quitting: 19.9  . Smokeless tobacco: Never Used  Vaping Use  . Vaping Use: Never used  Substance and Sexual Activity  . Alcohol use: No  . Drug use: No  . Sexual  activity: Not Currently  Other Topics Concern  . Not on file  Social History Narrative   He lives in [place] with family.  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         Social Determinants of Health   Financial Resource Strain:   . Difficulty of Paying Living Expenses: Not on file  Food Insecurity:   . Worried About Charity fundraiser in the Last Year: Not on file  .  Ran Out of Food in the Last Year: Not on file  Transportation Needs:   . Lack of Transportation (Medical): Not on file  . Lack of Transportation (Non-Medical): Not on file  Physical Activity:   . Days of Exercise per Week: Not on file  . Minutes of Exercise per Session: Not on file  Stress:   . Feeling of Stress : Not on file  Social Connections:   . Frequency of Communication with Friends and Family: Not on file  . Frequency of Social Gatherings with Friends and Family: Not on file  . Attends Religious Services: Not on file  . Active Member of Clubs or Organizations: Not on file  . Attends Archivist Meetings: Not on file  . Marital Status: Not on file    Review of Systems  Constitutional: Positive for fatigue (since CABG.  In cardiopulmonary rehab.). Negative for chills, diaphoresis and fever.  HENT: Negative for congestion, ear pain and sore throat.   Respiratory: Positive for shortness of breath. Negative for cough.   Cardiovascular: Negative for chest pain and leg swelling.  Gastrointestinal: Negative for abdominal pain, constipation, diarrhea, nausea and vomiting.  Endocrine: Positive for polyuria (More at night).  Genitourinary: Negative for dysuria and urgency.  Musculoskeletal: Negative for arthralgias and myalgias.  Neurological: Positive for weakness. Negative for dizziness and headaches.  Psychiatric/Behavioral: Negative for dysphoric mood.     Objective:  BP 112/60   Pulse 70    Temp (!) 97.3 F (36.3 C)   Ht 5\' 7"  (1.702 m)   Wt 210 lb (95.3 kg)   SpO2 99%   BMI 32.89 kg/m   BP/Weight 04/10/2020 04/03/2020 00/34/9179  Systolic BP 150 569 794  Diastolic BP 60 69 68  Wt. (Lbs) 210 207 208  BMI 32.89 32.42 32.58    Physical Exam Vitals reviewed.  Constitutional:      Appearance: Normal appearance.  Neck:     Vascular: No carotid bruit.  Cardiovascular:     Rate and Rhythm: Normal rate and regular rhythm.     Pulses: Normal pulses.     Heart sounds: Normal heart sounds.  Pulmonary:     Effort: Pulmonary effort is normal.     Breath sounds: Normal breath sounds. No wheezing, rhonchi or rales.  Abdominal:     General: Bowel sounds are normal.     Palpations: Abdomen is soft.     Tenderness: There is no abdominal tenderness.  Skin:    Comments: Midline sternotomy scar  Neurological:     Mental Status: He is alert.  Psychiatric:        Mood and Affect: Mood normal.        Behavior: Behavior normal.     Diabetic Foot Exam - Simple   No data filed       Lab Results  Component Value Date   WBC 8.8 02/21/2020   HGB 10.4 (L) 02/21/2020   HCT 32.0 (L) 02/21/2020   PLT 578 (H) 02/21/2020   GLUCOSE 117 (H) 03/28/2020   CHOL 124 03/28/2020   TRIG 163 (H) 03/28/2020   HDL 40 03/28/2020   LDLDIRECT 152.5 10/05/2010   LDLCALC 56 03/28/2020   ALT 15 03/28/2020   AST 16 03/28/2020   NA 135 03/28/2020   K 4.5 03/28/2020   CL 98 03/28/2020   CREATININE 1.25 03/28/2020   BUN 19 03/28/2020   CO2 22 03/28/2020   TSH 7.610 (H) 02/15/2020   INR 1.4 (H) 02/05/2020  HGBA1C 6.0 (H) 04/11/2020      Assessment & Plan:  1. Coronary artery disease involving native coronary artery of native heart without angina pectoris The current medical regimen is effective;  continue present plan and medications. Continue cardiopulmonary rehab. Continue to follow-up with cardiology. Suggested Mederma for his sternotomy scar.  2. Elevated blood sugar looking  back it appears that sugars have been elevated several times.  Last A1c was over the summer. - Hemoglobin A1c  3. Mixed hyperlipidemia Continue current medication (Crestor) Continue cardiopulmonary rehab.   Continue eating a low-fat diet.  4. Old myocardial infarction Status post triple bypass in August 2021.  Continue cardiopulmonary rehab  5. Class 1 obesity due to excess calories with serious comorbidity and body mass index (BMI) of 32.0 to 32.9 in adult Recommend continue to work on eating healthy diet and exercise.  Orders Placed This Encounter  Procedures  . Hemoglobin A1c     Follow-up: Return in about 3 months (around 07/11/2020).  An After Visit Summary was printed and given to the patient.  Rochel Brome [facility] [phone]

## 2020-04-11 ENCOUNTER — Other Ambulatory Visit: Payer: Medicare HMO

## 2020-04-11 DIAGNOSIS — Z7982 Long term (current) use of aspirin: Secondary | ICD-10-CM | POA: Diagnosis not present

## 2020-04-11 DIAGNOSIS — I25119 Atherosclerotic heart disease of native coronary artery with unspecified angina pectoris: Secondary | ICD-10-CM | POA: Diagnosis not present

## 2020-04-11 DIAGNOSIS — E785 Hyperlipidemia, unspecified: Secondary | ICD-10-CM | POA: Diagnosis not present

## 2020-04-11 DIAGNOSIS — R739 Hyperglycemia, unspecified: Secondary | ICD-10-CM | POA: Diagnosis not present

## 2020-04-11 DIAGNOSIS — Z79899 Other long term (current) drug therapy: Secondary | ICD-10-CM | POA: Diagnosis not present

## 2020-04-11 DIAGNOSIS — I252 Old myocardial infarction: Secondary | ICD-10-CM | POA: Diagnosis not present

## 2020-04-11 DIAGNOSIS — Z951 Presence of aortocoronary bypass graft: Secondary | ICD-10-CM | POA: Diagnosis not present

## 2020-04-11 LAB — HEMOGLOBIN A1C
Est. average glucose Bld gHb Est-mCnc: 126 mg/dL
Hgb A1c MFr Bld: 6 % — ABNORMAL HIGH (ref 4.8–5.6)

## 2020-04-12 DIAGNOSIS — R739 Hyperglycemia, unspecified: Secondary | ICD-10-CM | POA: Insufficient documentation

## 2020-04-12 DIAGNOSIS — E6609 Other obesity due to excess calories: Secondary | ICD-10-CM | POA: Insufficient documentation

## 2020-04-14 DIAGNOSIS — Z955 Presence of coronary angioplasty implant and graft: Secondary | ICD-10-CM | POA: Diagnosis not present

## 2020-04-14 DIAGNOSIS — Z951 Presence of aortocoronary bypass graft: Secondary | ICD-10-CM | POA: Diagnosis not present

## 2020-04-16 DIAGNOSIS — Z951 Presence of aortocoronary bypass graft: Secondary | ICD-10-CM | POA: Diagnosis not present

## 2020-04-16 DIAGNOSIS — Z955 Presence of coronary angioplasty implant and graft: Secondary | ICD-10-CM | POA: Diagnosis not present

## 2020-04-18 DIAGNOSIS — Z955 Presence of coronary angioplasty implant and graft: Secondary | ICD-10-CM | POA: Diagnosis not present

## 2020-04-18 DIAGNOSIS — Z951 Presence of aortocoronary bypass graft: Secondary | ICD-10-CM | POA: Diagnosis not present

## 2020-04-21 ENCOUNTER — Encounter: Payer: Self-pay | Admitting: Family Medicine

## 2020-04-21 ENCOUNTER — Ambulatory Visit (INDEPENDENT_AMBULATORY_CARE_PROVIDER_SITE_OTHER): Payer: Medicare HMO | Admitting: Family Medicine

## 2020-04-21 VITALS — BP 114/60 | HR 53 | Temp 97.2°F | Ht 67.0 in | Wt 208.0 lb

## 2020-04-21 DIAGNOSIS — Z0000 Encounter for general adult medical examination without abnormal findings: Secondary | ICD-10-CM

## 2020-04-21 DIAGNOSIS — E782 Mixed hyperlipidemia: Secondary | ICD-10-CM

## 2020-04-21 DIAGNOSIS — Z951 Presence of aortocoronary bypass graft: Secondary | ICD-10-CM | POA: Diagnosis not present

## 2020-04-21 DIAGNOSIS — Z1211 Encounter for screening for malignant neoplasm of colon: Secondary | ICD-10-CM

## 2020-04-21 DIAGNOSIS — Z955 Presence of coronary angioplasty implant and graft: Secondary | ICD-10-CM | POA: Diagnosis not present

## 2020-04-21 NOTE — Progress Notes (Deleted)
Established Patient Office Visit  Subjective:  Patient ID: Jeffery Turner, male    DOB: 1941/01/12  Age: 79 y.o. MRN: [id number]  CC:  Chief Complaint  Patient presents with  . Hospitalization Follow-up    CABG    HPI Jeffery Turner has history of CAD with stents in 1996 and 2012 presents c/o BL jaw pain with exertion. He recognizes these symptoms from his previous heart attacks. He has not had any symptoms for 3 weeks. He has some associated shortness of breath. Denies associated chest pain, nausea, vomiting, diaphoresis. He also develops BL leg pain with ambulation which resolves with rest. He has refused statins in the past due to friend's experience, not his own. He has not followed up with cardiology. He has ntg he keeps in his freezer that is likely expired, but has not tried it.   Past Medical History:  Diagnosis Date  . Anemia   . Angina pectoris ([place]) 02/06/2018  . Angina, class III ([place]) 02/06/2018  . Anginal equivalent ([place]) 09/19/2019  . CAD (coronary artery disease)    s/p stent to CFX 1997 2/2 MI s/p DES to [facility] and DES mLAD 07/14/10 2/2 [place] cath 07/14/10: dLAD 60%; CFX stent ok; OM1 30-40%; pRCA 30%; PDA 40%; EF 60%  . Constant exophthalmos    right  . Coronary artery disease of native artery of native heart with stable angina pectoris ([place]) 07/21/2010   Qualifier: Diagnosis of  By: Denny Peon, CMA, [name]    . Epistaxis 07/21/2010   Qualifier: Diagnosis of  By: Jorene Minors, Scott    . Hearing loss    right eye - no hearing aids  . Hyperlipidemia   . MI (myocardial infarction) ([place])   . Mixed hyperlipidemia 07/21/2010   Qualifier: Diagnosis of  By: Orville Govern CMA, Arbie Cookey    . Old myocardial infarction 07/21/2010   Qualifier: Diagnosis of  By: Hester Mates, Carol    . Osteoarthritis   . OSTEOARTHRITIS 07/21/2010   Qualifier: Diagnosis of  By: Ronne Binning    . 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 Shortness of breath 07/21/2010   Qualifier: Diagnosis of  By: Jorene Minors, Scott    . TOBACCO USE,  QUIT 07/21/2010   Qualifier: Diagnosis of  By: Orville Govern CMA, Arbie Cookey    . Transient visual loss, right    wears glasses  . UNSTABLE ANGINA 07/21/2010   Qualifier: Diagnosis of  By: Ronne Binning      Past Surgical History:  Procedure Laterality Date  . CARDIAC CATHETERIZATION    . CORONARY ARTERY BYPASS GRAFT N/A 02/05/2020   Procedure: CORONARY ARTERY BYPASS GRAFTING (CABG), ON PUMP, TIMES THREE, USING LEFT INTERNAL MAMMARY ARTERY TO LEFT ANTERIOR DESCENDING ARTERY, REVERSE SAPHENOUS VEIN GRAFT TO POSTERIOR DESCENDING ARTERY AND CIRCUMFLEX ARTERY;  Surgeon: Grace Isaac, MD;  Location: [place] [facility];  Service: Open Heart Surgery;  Laterality: N/A;  . ENDOVEIN HARVEST OF GREATER SAPHENOUS VEIN Right 02/05/2020   Procedure: ENDOVEIN HARVEST OF GREATER SAPHENOUS VEIN;  Surgeon: Grace Isaac, MD;  Location: [facility];  Service: Open Heart Surgery;  Laterality: Right;  . LEFT HEART CATH AND CORONARY ANGIOGRAPHY N/A 01/25/2020   Procedure: LEFT HEART CATH AND CORONARY ANGIOGRAPHY;  Surgeon: Leonie Man, MD;  Location: [facility];  Service: Cardiovascular;  Laterality: N/A;  . TEE WITHOUT CARDIOVERSION N/A 02/05/2020   Procedure: TRANSESOPHAGEAL ECHOCARDIOGRAM (TEE);  Surgeon: Grace Isaac, MD;  Location: [facility];  Service: Open Heart  Surgery;  Laterality: N/A;  . TONSILLECTOMY AND ADENOIDECTOMY    . WISDOM TOOTH EXTRACTION      Family History  Problem Relation Age of Onset  . Coronary artery disease Other   . Heart failure Mother   . Alzheimer's disease Mother     Social History   Socioeconomic History  . Marital status: Married    Spouse name: Not on file  . Number of children: Not on file  . Years of education: Not on file  . Highest education level: Not on file  Occupational History  . Not on file  Tobacco Use  . Smoking status: Former Smoker    Packs/day: 1.50    Years: 37.00    Pack years: 55.50    Types: Cigarettes    Quit date: 05/2000    Years since  quitting: 19.9  . Smokeless tobacco: Never Used  Vaping Use  . Vaping Use: Never used  Substance and Sexual Activity  . Alcohol use: No  . Drug use: No  . Sexual activity: Not Currently  Other Topics Concern  . Not on file  Social History Narrative   He lives in [place] with family.  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         Social Determinants of Health   Financial Resource Strain:   . Difficulty of Paying Living Expenses: Not on file  Food Insecurity:   . Worried About Charity fundraiser in the Last Year: Not on file  . Ran Out of Food in the Last Year: Not on file  Transportation Needs:   . Lack of Transportation (Medical): Not on file  . Lack of Transportation (Non-Medical): Not on file  Physical Activity:   . Days of Exercise per Week: Not on file  . Minutes of Exercise per Session: Not on file  Stress:   . Feeling of Stress : Not on file  Social Connections:   . Frequency of Communication with Friends and Family: Not on file  . Frequency of Social Gatherings with Friends and Family: Not on file  . Attends Religious Services: Not on file  . Active Member of Clubs or Organizations: Not on file  . Attends Archivist Meetings: Not on file  . Marital Status: Not on file  Intimate Partner Violence:   . Fear of Current or Ex-Partner: Not on file  . Emotionally Abused: Not on file  . Physically Abused: Not on file  . Sexually Abused: Not on file    Outpatient Medications Prior to Visit  Medication Sig Dispense Refill  . aspirin EC 325 MG tablet Take 1 tablet (325 mg total) by mouth daily. 90 tablet 0  . Calcium-Magnesium-Zinc (CAL-MAG-ZINC PO) Take 1 tablet by mouth daily.     . COCONUT OIL PO Take 30 mLs by mouth daily.     . metoprolol tartrate (LOPRESSOR) 25 MG tablet Take 1 tablet (25 mg total) by mouth 2 (two) times daily. 60 tablet 3  .  mometasone-formoterol (DULERA) 100-5 MCG/ACT AERO Inhale 2 puffs into the lungs 2 (two) times daily. 1 each 1  . Multiple Vitamin (MULTIVITAMIN WITH MINERALS) TABS tablet Take 1 tablet by mouth daily.     . nitroGLYCERIN (NITROSTAT) 0.4 MG SL tablet Place 1 tablet (0.4 mg total) under the tongue every 5 (five) minutes as needed. If  require 2, recommend call 911. (Patient taking differently: Place 0.4 mg under the tongue every 5 (five) minutes as needed for chest pain. If require 2, recommend call 911.) 50 tablet 0  . OVER THE COUNTER MEDICATION Place 1 drop into both eyes 3 (three) times daily as needed (eye irritation.). Colloidal Silver Eye Drops    . rosuvastatin (CRESTOR) 10 MG tablet Take 1 tablet (10 mg total) by mouth daily. 30 tablet 3   No facility-administered medications prior to visit.    Allergies  Allergen Reactions  . Bactrim [Sulfamethoxazole-Trimethoprim] Shortness Of Breath    Breathing problems.  Rt ear - hearing loss.   . Codeine Nausea And Vomiting  . Statins Other (See Comments)    Patient preference to not take statins/cholesterol meds    ROS Review of Systems  Constitutional: Negative for chills, diaphoresis, fatigue and fever.  HENT: Negative for congestion, ear pain and sore throat.   Respiratory: Positive for shortness of breath (with exertion). Negative for cough.   Cardiovascular: Positive for leg swelling. Negative for chest pain.  Gastrointestinal: Negative for abdominal pain, constipation, diarrhea, nausea and vomiting.  Genitourinary: Negative for dysuria and urgency.  Musculoskeletal: Negative for arthralgias and myalgias.  Neurological: Negative for dizziness and headaches.  Psychiatric/Behavioral: Negative for dysphoric mood.      Objective:    Physical Exam  Constitutional: He appears well-developed and well-nourished.  Cardiovascular: Normal rate, regular rhythm, normal heart sounds and intact distal pulses.  Pulmonary/Chest: Effort normal  and breath sounds normal.  Abdominal: Soft. Bowel sounds are normal. There is no abdominal tenderness.  Neurological: He is alert.  Psychiatric: He has a normal mood and affect. His behavior is normal.    BP 114/60   Pulse (!) 53   Temp (!) 97.2 F (36.2 C)   Ht 5\' 7"  (1.702 m)   Wt 208 lb (94.3 kg)   SpO2 97%   BMI 32.58 kg/m  Wt Readings from Last 3 Encounters:  04/21/20 208 lb (94.3 kg)  04/10/20 210 lb (95.3 kg)  04/03/20 207 lb (93.9 kg)     Health Maintenance Due  Topic Date Due  . Hepatitis C Screening  Never done    There are no preventive care reminders to display for this patient.  Lab Results  Component Value Date   TSH 7.610 (H) 02/15/2020   Lab Results  Component Value Date   WBC 8.8 02/21/2020   HGB 10.4 (L) 02/21/2020   HCT 32.0 (L) 02/21/2020   MCV 93 02/21/2020   PLT 578 (H) 02/21/2020   Lab Results  Component Value Date   NA 135 03/28/2020   K 4.5 03/28/2020   CO2 22 03/28/2020   GLUCOSE 117 (H) 03/28/2020   BUN 19 03/28/2020   CREATININE 1.25 03/28/2020   BILITOT 0.3 03/28/2020   ALKPHOS 60 03/28/2020   AST 16 03/28/2020   ALT 15 03/28/2020   PROT 7.0 03/28/2020   ALBUMIN 4.3 03/28/2020   CALCIUM 9.3 03/28/2020   ANIONGAP 11 02/10/2020   Lab Results  Component Value Date   CHOL 124 03/28/2020   Lab Results  Component Value Date   HDL 40 03/28/2020   Lab Results  Component Value Date   LDLCALC 56 03/28/2020   Lab Results  Component Value Date   TRIG 163 (H) 03/28/2020   Lab Results  Component Value Date   CHOLHDL 3.1 03/28/2020   Lab Results  Component Value Date   HGBA1C 6.0 (H) 04/11/2020  Assessment & Plan:  1. Coronary artery disease of native artery of native heart with stable angina pectoris (HCC)  - CBC with Differential/Platelet - Comprehensive metabolic panel - Lipid panel  2. Mixed hyperlipidemia Continue Crestor   Follow-up: No follow-ups on file.    Arsenio Katz, CMA

## 2020-04-21 NOTE — Patient Instructions (Signed)
Ordering cologuard test for colon cancer screening.   Preventive Care 39 Years and Older, Male Preventive care refers to lifestyle choices and visits with your health care provider that can promote health and wellness. This includes:  A yearly physical exam. This is also called an annual well check.  Regular dental and eye exams.  Immunizations.  Screening for certain conditions.  Healthy lifestyle choices, such as diet and exercise. What can I expect for my preventive care visit? Physical exam Your health care provider will check:  Height and weight. These may be used to calculate body mass index (BMI), which is a measurement that tells if you are at a healthy weight.  Heart rate and blood pressure.  Your skin for abnormal spots. Counseling Your health care provider may ask you questions about:  Alcohol, tobacco, and drug use.  Emotional well-being.  Home and relationship well-being.  Sexual activity.  Eating habits.  History of falls.  Memory and ability to understand (cognition).  Work and work Statistician. What immunizations do I need?  Influenza (flu) vaccine  This is recommended every year. Tetanus, diphtheria, and pertussis (Tdap) vaccine  You may need a Td booster every 10 years. Varicella (chickenpox) vaccine  You may need this vaccine if you have not already been vaccinated. Zoster (shingles) vaccine  You may need this after age 90. Pneumococcal conjugate (PCV13) vaccine  One dose is recommended after age 24. Pneumococcal polysaccharide (PPSV23) vaccine  One dose is recommended after age 79. Measles, mumps, and rubella (MMR) vaccine  You may need at least one dose of MMR if you were born in 1957 or later. You may also need a second dose. Meningococcal conjugate (MenACWY) vaccine  You may need this if you have certain conditions. Hepatitis A vaccine  You may need this if you have certain conditions or if you travel or work in places where  you may be exposed to hepatitis A. Hepatitis B vaccine  You may need this if you have certain conditions or if you travel or work in places where you may be exposed to hepatitis B. Haemophilus influenzae type b (Hib) vaccine  You may need this if you have certain conditions. You may receive vaccines as individual doses or as more than one vaccine together in one shot (combination vaccines). Talk with your health care provider about the risks and benefits of combination vaccines. What tests do I need? Blood tests  Lipid and cholesterol levels. These may be checked every 5 years, or more frequently depending on your overall health.  Hepatitis C test.  Hepatitis B test. Screening  Lung cancer screening. You may have this screening every year starting at age 83 if you have a 30-pack-year history of smoking and currently smoke or have quit within the past 15 years.  Colorectal cancer screening. All adults should have this screening starting at age 56 and continuing until age 37. Your health care provider may recommend screening at age 87 if you are at increased risk. You will have tests every 1-10 years, depending on your results and the type of screening test.  Prostate cancer screening. Recommendations will vary depending on your family history and other risks.  Diabetes screening. This is done by checking your blood sugar (glucose) after you have not eaten for a while (fasting). You may have this done every 1-3 years.  Abdominal aortic aneurysm (AAA) screening. You may need this if you are a current or former smoker.  Sexually transmitted disease (STD) testing. Follow  these instructions at home: Eating and drinking  Eat a diet that includes fresh fruits and vegetables, whole grains, lean protein, and low-fat dairy products. Limit your intake of foods with high amounts of sugar, saturated fats, and salt.  Take vitamin and mineral supplements as recommended by your health care  provider.  Do not drink alcohol if your health care provider tells you not to drink.  If you drink alcohol: ? Limit how much you have to 0-2 drinks a day. ? Be aware of how much alcohol is in your drink. In the [place] one drink equals one 12 oz bottle of beer (355 mL), one 5 oz glass of wine (148 mL), or one 1 oz glass of hard liquor (44 mL). Lifestyle  Take daily care of your teeth and gums.  Stay active. Exercise for at least 30 minutes on 5 or more days each week.  Do not use any products that contain nicotine or tobacco, such as cigarettes, e-cigarettes, and chewing tobacco. If you need help quitting, ask your health care provider.  If you are sexually active, practice safe sex. Use a condom or other form of protection to prevent STIs (sexually transmitted infections).  Talk with your health care provider about taking a low-dose aspirin or statin. What's next?  Visit your health care provider once a year for a well check visit.  Ask your health care provider how often you should have your eyes and teeth checked.  Stay up to date on all vaccines. This information is not intended to replace advice given to you by your health care provider. Make sure you discuss any questions you have with your health care provider. Document Revised: 05/25/2018 Document Reviewed: 05/25/2018 Elsevier Patient Education  2020 [place] Directive  Advance directives are legal documents that let you make choices ahead of time about your health care and medical treatment in case you become unable to communicate for yourself. Advance directives are a way for you to make known your wishes to family, friends, and health care providers. This can let others know about your end-of-life care if you become unable to communicate. Discussing and writing advance directives should happen over time rather than all at once. Advance directives can be changed depending on your situation and what you want,  even after you have signed the advance directives. There are different types of advance directives, such as:  Medical power of attorney.  Living will.  Do not resuscitate (DNR) or do not attempt resuscitation (DNAR) order. Health care proxy and medical power of attorney A health care proxy is also called a health care agent. This is a person who is appointed to make medical decisions for you in cases where you are unable to make the decisions yourself. Generally, people choose someone they know well and trust to represent their preferences. Make sure to ask this person for an agreement to act as your proxy. A proxy may have to exercise judgment in the event of a medical decision for which your wishes are not known. A medical power of attorney is a legal document that names your health care proxy. Depending on the laws in your state, after the document is written, it may also need to be:  Signed.  Notarized.  Dated.  Copied.  Witnessed.  Incorporated into your medical record. You may also want to appoint someone to manage your money in a situation in which you are unable to do so. This is called a durable  power of attorney for finances. It is a separate legal document from the durable power of attorney for health care. You may choose the same person or someone different from your health care proxy to act as your agent in money matters. If you do not appoint a proxy, or if there is a concern that the proxy is not acting in your best interests, a court may appoint a guardian to act on your behalf. Living will A living will is a set of instructions that state your wishes about medical care when you cannot express them yourself. Health care providers should keep a copy of your living will in your medical record. You may want to give a copy to family members or friends. To alert caregivers in case of an emergency, you can place a card in your wallet to let them know that you have a living will  and where they can find it. A living will is used if you become:  Terminally ill.  Disabled.  Unable to communicate or make decisions. Items to consider in your living will include:  To use or not to use life-support equipment, such as dialysis machines and breathing machines (ventilators).  A DNR or DNAR order. This tells health care providers not to use cardiopulmonary resuscitation (CPR) if breathing or heartbeat stops.  To use or not to use tube feeding.  To be given or not to be given food and fluids.  Comfort (palliative) care when the goal becomes comfort rather than a cure.  Donation of organs and tissues. A living will does not give instructions for distributing your money and property if you should pass away. DNR or DNAR A DNR or DNAR order is a request not to have CPR in the event that your heart stops beating or you stop breathing. If a DNR or DNAR order has not been made and shared, a health care provider will try to help any patient whose heart has stopped or who has stopped breathing. If you plan to have surgery, talk with your health care provider about how your DNR or DNAR order will be followed if problems occur. What if I do not have an advance directive? If you do not have an advance directive, some states assign family decision makers to act on your behalf based on how closely you are related to them. Each state has its own laws about advance directives. You may want to check with your health care provider, attorney, or state representative about the laws in your state. Summary  Advance directives are the legal documents that allow you to make choices ahead of time about your health care and medical treatment in case you become unable to tell others about your care.  The process of discussing and writing advance directives should happen over time. You can change the advance directives, even after you have signed them.  Advance directives include DNR or DNAR orders,  living wills, and designating an agent as your medical power of attorney. This information is not intended to replace advice given to you by your health care provider. Make sure you discuss any questions you have with your health care provider. Document Revised: 12/28/2018 Document Reviewed: 12/28/2018 Elsevier Patient Education  [place].

## 2020-04-23 ENCOUNTER — Encounter: Payer: Self-pay | Admitting: Family Medicine

## 2020-04-23 DIAGNOSIS — Z955 Presence of coronary angioplasty implant and graft: Secondary | ICD-10-CM | POA: Diagnosis not present

## 2020-04-23 DIAGNOSIS — Z951 Presence of aortocoronary bypass graft: Secondary | ICD-10-CM | POA: Diagnosis not present

## 2020-04-23 NOTE — Progress Notes (Signed)
Subjective:  Patient ID: Jeffery Turner, male    DOB: [birth year]  Age: 79 y.o. MRN: [id number]  Chief Complaint  Patient presents with  . Annual Exam    HPI Encounter for general adult medical examination without abnormal findings  Physical ("At Risk" items are starred): Patient's last physical exam was 1 year ago .   Growth percentile SmartLinks can only be used for patients less than 48 years old.   SDOH Screenings   Alcohol Screen:   . Last Alcohol Screening Score (AUDIT): Not on file  Depression (PHQ2-9): Low Risk   . PHQ-2 Score: 0  Financial Resource Strain:   . Difficulty of Paying Living Expenses: Not on file  Food Insecurity:   . Worried About Charity fundraiser in the Last Year: Not on file  . Ran Out of Food in the Last Year: Not on file  Housing:   . Last Housing Risk Score: Not on file  Physical Activity: Sufficiently Active  . Days of Exercise per Week: 5 days  . Minutes of Exercise per Session: 30 min  Social Connections: Unknown  . Frequency of Communication with Friends and Family: Not on file  . Frequency of Social Gatherings with Friends and Family: Not on file  . Attends Religious Services: Not on file  . Active Member of Clubs or Organizations: Not on file  . Attends Archivist Meetings: Not on file  . Marital Status: Married  Stress: Stress Concern Present  . Feeling of Stress : To some extent  Tobacco Use: Medium Risk  . Smoking Tobacco Use: Former Smoker  . Smokeless Tobacco Use: Never Used  Transportation Needs:   . Film/video editor (Medical): Not on file  . Lack of Transportation (Non-Medical): Not on file    Fall Risk  04/23/2020 04/12/2020  Falls in the past year? 0 0  Number falls in past yr: 0 0  Injury with Fall? 0 0  Risk for fall due to : - No Fall Risks  Follow up - Falls evaluation completed;Falls prevention discussed    Depression screen [facility] 2/9 04/23/2020 04/12/2020  Decreased Interest 0 0  Down, Depressed,  Hopeless 0 0  PHQ - 2 Score 0 0    Functional Status Survey: Is the patient deaf or have difficulty hearing?: No Does the patient have difficulty seeing, even when wearing glasses/contacts?: No Does the patient have difficulty concentrating, remembering, or making decisions?: No Does the patient have difficulty walking or climbing stairs?: No Does the patient have difficulty dressing or bathing?: No Does the patient have difficulty doing errands alone such as visiting a doctor's office or shopping?: No   Safety: reviewed ;  Patient wears a seat belt. Patient's home has smoke detectors and carbon monoxide detectors. Dental Care: brushes Ophthalmology/Optometry: Annual visit.  Hearing loss: none Vision impairments: none Patient is not afflicted from Stress Incontinence and Urge Incontinence   Current Outpatient Medications on File Prior to Visit  Medication Sig Dispense Refill  . aspirin EC 325 MG tablet Take 1 tablet (325 mg total) by mouth daily. 90 tablet 0  . Calcium-Magnesium-Zinc (CAL-MAG-ZINC PO) Take 1 tablet by mouth daily.     . COCONUT OIL PO Take 30 mLs by mouth daily.     . metoprolol tartrate (LOPRESSOR) 25 MG tablet Take 1 tablet (25 mg total) by mouth 2 (two) times daily. 60 tablet 3  . mometasone-formoterol (DULERA) 100-5 MCG/ACT AERO Inhale 2 puffs into the lungs 2 (two)  times daily. 1 each 1  . Multiple Vitamin (MULTIVITAMIN WITH MINERALS) TABS tablet Take 1 tablet by mouth daily.     . nitroGLYCERIN (NITROSTAT) 0.4 MG SL tablet Place 1 tablet (0.4 mg total) under the tongue every 5 (five) minutes as needed. If require 2, recommend call 911. (Patient taking differently: Place 0.4 mg under the tongue every 5 (five) minutes as needed for chest pain. If require 2, recommend call 911.) 50 tablet 0  . OVER THE COUNTER MEDICATION Place 1 drop into both eyes 3 (three) times daily as needed (eye irritation.). Colloidal Silver Eye Drops    . rosuvastatin (CRESTOR) 10 MG tablet  Take 1 tablet (10 mg total) by mouth daily. 30 tablet 3   No current facility-administered medications on file prior to visit.    Social Hx   Social History   Socioeconomic History  . Marital status: Married    Spouse name: Not on file  . Number of children: Not on file  . Years of education: Not on file  . Highest education level: Not on file  Occupational History  . Not on file  Tobacco Use  . Smoking status: Former Smoker    Packs/day: 1.50    Years: 37.00    Pack years: 55.50    Types: Cigarettes    Quit date: 05/2000    Years since quitting: 20.0  . Smokeless tobacco: Never Used  Vaping Use  . Vaping Use: Never used  Substance and Sexual Activity  . Alcohol use: No  . Drug use: No  . Sexual activity: Not Currently  Other Topics Concern  . Not on file  Social History Narrative   He lives in [place] with family.  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         Social Determinants of Health   Financial Resource Strain:   . Difficulty of Paying Living Expenses: Not on file  Food Insecurity:   . Worried About Charity fundraiser in the Last Year: Not on file  . Ran Out of Food in the Last Year: Not on file  Transportation Needs:   . Lack of Transportation (Medical): Not on file  . Lack of Transportation (Non-Medical): Not on file  Physical Activity: Sufficiently Active  . Days of Exercise per Week: 5 days  . Minutes of Exercise per Session: 30 min  Stress: Stress Concern Present  . Feeling of Stress : To some extent  Social Connections: Unknown  . Frequency of Communication with Friends and Family: Not on file  . Frequency of Social Gatherings with Friends and Family: Not on file  . Attends Religious Services: Not on file  . Active Member of Clubs or Organizations: Not on file  . Attends Archivist Meetings: Not on file  . Marital Status: Married     Past Medical History:  Diagnosis Date  . Anemia   . Angina pectoris ([place]) 02/06/2018  . Angina, class III ([place]) 02/06/2018  . Anginal equivalent ([place]) 09/19/2019  . CAD (coronary artery disease)    s/p stent to CFX 1997 2/2 MI s/p DES to [facility] and DES mLAD 07/14/10 2/2 [place] cath 07/14/10: dLAD 60%; CFX stent ok; OM1 30-40%; pRCA 30%; PDA 40%; EF 60%  . Constant exophthalmos    right  . Coronary artery disease of native artery of native  heart with stable angina pectoris ([place]) 07/21/2010   Qualifier: Diagnosis of  By: Denny Peon, CMA, [name]    . Epistaxis 07/21/2010   Qualifier: Diagnosis of  By: Jorene Minors, Scott    . Hearing loss    right eye - no hearing aids  . Hyperlipidemia   . MI (myocardial infarction) ([place])   . Mixed hyperlipidemia 07/21/2010   Qualifier: Diagnosis of  By: Orville Govern CMA, Arbie Cookey    . Old myocardial infarction 07/21/2010   Qualifier: Diagnosis of  By: Hester Mates, Carol    . Osteoarthritis   . OSTEOARTHRITIS 07/21/2010   Qualifier: Diagnosis of  By: Ronne Binning    . 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 Shortness of breath 07/21/2010   Qualifier: Diagnosis of  By: Jorene Minors, Scott    . TOBACCO USE, QUIT 07/21/2010   Qualifier: Diagnosis of  By: Orville Govern CMA, Arbie Cookey    . Transient visual loss, right    wears glasses  . UNSTABLE ANGINA 07/21/2010   Qualifier: Diagnosis of  By: Orville Govern CMA, Carol     Family History  Problem Relation Age of Onset  . Coronary artery disease Other   . Heart failure Mother   . Alzheimer's disease Mother     Review of Systems  Constitutional: Negative for chills, fatigue and fever.  HENT: Negative for congestion, ear pain and sore throat.   Respiratory: Negative for cough and shortness of breath.   Cardiovascular: Negative for chest pain.  Gastrointestinal: Negative for abdominal pain, constipation, diarrhea, nausea and vomiting.  Endocrine: Negative for polydipsia, polyphagia and polyuria.  Genitourinary: Negative for dysuria and frequency.   Musculoskeletal: Negative for arthralgias and myalgias.  Neurological: Negative for dizziness and headaches.  Psychiatric/Behavioral: Negative for dysphoric mood.       No dysphoria     Objective:  BP 114/60   Pulse (!) 53   Temp (!) 97.2 F (36.2 C)   Ht 5\' 7"  (1.702 m)   Wt 208 lb (94.3 kg)   SpO2 97%   BMI 32.58 kg/m   BP/Weight 04/21/2020 04/10/2020 66/59/9357  Systolic BP 017 793 903  Diastolic BP 60 60 69  Wt. (Lbs) 208 210 207  BMI 32.58 32.89 32.42    Physical Exam Constitutional:      Appearance: Normal appearance.  Cardiovascular:     Rate and Rhythm: Normal rate and regular rhythm.     Heart sounds: Normal heart sounds.  Pulmonary:     Effort: Pulmonary effort is normal.     Breath sounds: Normal breath sounds.  Neurological:     Mental Status: He is alert and oriented to person, place, and time.  Psychiatric:        Mood and Affect: Mood normal.        Behavior: Behavior normal.     Lab Results  Component Value Date   WBC 8.8 02/21/2020   HGB 10.4 (L) 02/21/2020   HCT 32.0 (L) 02/21/2020   PLT 578 (H) 02/21/2020   GLUCOSE 117 (H) 03/28/2020   CHOL 124 03/28/2020   TRIG 163 (H) 03/28/2020   HDL 40 03/28/2020   LDLDIRECT 152.5 10/05/2010   LDLCALC 56 03/28/2020   ALT 15 03/28/2020   AST 16 03/28/2020   NA 135 03/28/2020   K 4.5 03/28/2020   CL 98 03/28/2020   CREATININE 1.25 03/28/2020   BUN 19 03/28/2020   CO2 22 03/28/2020   TSH 13.200 (H) 05/05/2020   INR 1.4 (H) 02/05/2020  HGBA1C 6.0 (H) 04/11/2020      Assessment & Plan:  1. Routine medical exam  Patient is working hard to be healthy having had significant cardiac issues over the last year. Education given.  Refuses flu shots and pneumonia vaccines.  2. Colon cancer screening   Order cologuard.  This is a list of the screening recommended for you and due dates:  Health Maintenance  Topic Date Due  .  Hepatitis C: One time screening is recommended by Center for Disease  Control  (CDC) for  adults born from [birth year] through 1965.   Never done  . Flu Shot  09/11/2020*  . Pneumonia vaccines (1 of 2 - PCV13) 04/12/2021*  . Tetanus Vaccine  04/07/2025  . COVID-19 Vaccine  Completed  *Topic was postponed. The date shown is not the original due date.     AN INDIVIDUALIZED CARE PLAN: was established or reinforced today.   SELF MANAGEMENT: The patient and I together assessed ways to personally work towards obtaining the recommended goals  Support needs The patient and/or family needs were assessed and services were offered and not necessary at this time.    Follow-up: Return in about 3 months (around 07/22/2020) for fasting. Rochel Brome, MD [place] [phone]

## 2020-04-25 DIAGNOSIS — Z951 Presence of aortocoronary bypass graft: Secondary | ICD-10-CM | POA: Diagnosis not present

## 2020-04-25 DIAGNOSIS — Z955 Presence of coronary angioplasty implant and graft: Secondary | ICD-10-CM | POA: Diagnosis not present

## 2020-04-28 DIAGNOSIS — Z951 Presence of aortocoronary bypass graft: Secondary | ICD-10-CM | POA: Diagnosis not present

## 2020-04-28 DIAGNOSIS — Z955 Presence of coronary angioplasty implant and graft: Secondary | ICD-10-CM | POA: Diagnosis not present

## 2020-04-30 DIAGNOSIS — Z955 Presence of coronary angioplasty implant and graft: Secondary | ICD-10-CM | POA: Diagnosis not present

## 2020-04-30 DIAGNOSIS — Z951 Presence of aortocoronary bypass graft: Secondary | ICD-10-CM | POA: Diagnosis not present

## 2020-05-02 DIAGNOSIS — Z951 Presence of aortocoronary bypass graft: Secondary | ICD-10-CM | POA: Diagnosis not present

## 2020-05-02 DIAGNOSIS — Z955 Presence of coronary angioplasty implant and graft: Secondary | ICD-10-CM | POA: Diagnosis not present

## 2020-05-05 ENCOUNTER — Other Ambulatory Visit: Payer: Medicare HMO

## 2020-05-05 ENCOUNTER — Telehealth: Payer: Self-pay | Admitting: Family Medicine

## 2020-05-05 ENCOUNTER — Other Ambulatory Visit: Payer: Self-pay | Admitting: Family Medicine

## 2020-05-05 ENCOUNTER — Encounter: Payer: Self-pay | Admitting: Family Medicine

## 2020-05-05 DIAGNOSIS — R7989 Other specified abnormal findings of blood chemistry: Secondary | ICD-10-CM | POA: Diagnosis not present

## 2020-05-05 DIAGNOSIS — Z951 Presence of aortocoronary bypass graft: Secondary | ICD-10-CM | POA: Diagnosis not present

## 2020-05-05 DIAGNOSIS — Z955 Presence of coronary angioplasty implant and graft: Secondary | ICD-10-CM | POA: Diagnosis not present

## 2020-05-05 NOTE — Telephone Encounter (Signed)
Pt here for TSH repeat-Elevated on labwork-d/w pt need to repeat labwork and possible need for medication. Pt would start synthroid 26mcg if TSH continued to be elevated. Pt understands need to take separate from other medications

## 2020-05-06 ENCOUNTER — Other Ambulatory Visit: Payer: Self-pay

## 2020-05-06 ENCOUNTER — Other Ambulatory Visit: Payer: Self-pay | Admitting: Family Medicine

## 2020-05-06 DIAGNOSIS — Z951 Presence of aortocoronary bypass graft: Secondary | ICD-10-CM | POA: Diagnosis not present

## 2020-05-06 DIAGNOSIS — E039 Hypothyroidism, unspecified: Secondary | ICD-10-CM

## 2020-05-06 DIAGNOSIS — Z955 Presence of coronary angioplasty implant and graft: Secondary | ICD-10-CM | POA: Diagnosis not present

## 2020-05-06 LAB — T4 AND TSH
T4, Total: 6.7 ug/dL (ref 4.5–12.0)
TSH: 13.2 u[IU]/mL — ABNORMAL HIGH (ref 0.450–4.500)

## 2020-05-07 ENCOUNTER — Other Ambulatory Visit: Payer: Self-pay

## 2020-05-07 DIAGNOSIS — Z951 Presence of aortocoronary bypass graft: Secondary | ICD-10-CM | POA: Diagnosis not present

## 2020-05-07 DIAGNOSIS — Z955 Presence of coronary angioplasty implant and graft: Secondary | ICD-10-CM | POA: Diagnosis not present

## 2020-05-07 MED ORDER — LEVOTHYROXINE SODIUM 25 MCG PO TABS: 25.0000 ug | ORAL_TABLET | Freq: Every day | ORAL | 0 refills | Status: DC

## 2020-05-12 DIAGNOSIS — Z951 Presence of aortocoronary bypass graft: Secondary | ICD-10-CM | POA: Diagnosis not present

## 2020-05-12 DIAGNOSIS — Z955 Presence of coronary angioplasty implant and graft: Secondary | ICD-10-CM | POA: Diagnosis not present

## 2020-05-16 DIAGNOSIS — Z951 Presence of aortocoronary bypass graft: Secondary | ICD-10-CM | POA: Diagnosis not present

## 2020-05-16 DIAGNOSIS — Z955 Presence of coronary angioplasty implant and graft: Secondary | ICD-10-CM | POA: Diagnosis not present

## 2020-05-19 DIAGNOSIS — Z955 Presence of coronary angioplasty implant and graft: Secondary | ICD-10-CM | POA: Diagnosis not present

## 2020-05-19 DIAGNOSIS — Z951 Presence of aortocoronary bypass graft: Secondary | ICD-10-CM | POA: Diagnosis not present

## 2020-05-21 DIAGNOSIS — Z951 Presence of aortocoronary bypass graft: Secondary | ICD-10-CM | POA: Diagnosis not present

## 2020-05-21 DIAGNOSIS — Z955 Presence of coronary angioplasty implant and graft: Secondary | ICD-10-CM | POA: Diagnosis not present

## 2020-05-23 DIAGNOSIS — Z951 Presence of aortocoronary bypass graft: Secondary | ICD-10-CM | POA: Diagnosis not present

## 2020-05-23 DIAGNOSIS — Z955 Presence of coronary angioplasty implant and graft: Secondary | ICD-10-CM | POA: Diagnosis not present

## 2020-05-26 ENCOUNTER — Ambulatory Visit (INDEPENDENT_AMBULATORY_CARE_PROVIDER_SITE_OTHER): Payer: Medicare HMO | Admitting: Nurse Practitioner

## 2020-05-26 ENCOUNTER — Encounter: Payer: Self-pay | Admitting: Nurse Practitioner

## 2020-05-26 ENCOUNTER — Other Ambulatory Visit: Payer: Self-pay

## 2020-05-26 VITALS — BP 128/76 | HR 64 | Temp 97.2°F | Ht 67.0 in | Wt 204.0 lb

## 2020-05-26 DIAGNOSIS — Z951 Presence of aortocoronary bypass graft: Secondary | ICD-10-CM | POA: Diagnosis not present

## 2020-05-26 DIAGNOSIS — I25118 Atherosclerotic heart disease of native coronary artery with other forms of angina pectoris: Secondary | ICD-10-CM | POA: Diagnosis not present

## 2020-05-26 DIAGNOSIS — Z955 Presence of coronary angioplasty implant and graft: Secondary | ICD-10-CM | POA: Diagnosis not present

## 2020-05-26 DIAGNOSIS — I252 Old myocardial infarction: Secondary | ICD-10-CM | POA: Diagnosis not present

## 2020-05-26 DIAGNOSIS — I209 Angina pectoris, unspecified: Secondary | ICD-10-CM

## 2020-05-26 DIAGNOSIS — Z862 Personal history of diseases of the blood and blood-forming organs and certain disorders involving the immune mechanism: Secondary | ICD-10-CM

## 2020-05-26 LAB — CBC WITH DIFFERENTIAL/PLATELET
Basophils Absolute: 0 10*3/uL (ref 0.0–0.2)
Basos: 0 %
EOS (ABSOLUTE): 0.2 10*3/uL (ref 0.0–0.4)
Eos: 3 %
Hematocrit: 39.3 % (ref 37.5–51.0)
Hemoglobin: 13.3 g/dL (ref 13.0–17.7)
Lymphocytes Absolute: 1.3 10*3/uL (ref 0.7–3.1)
Lymphs: 24 %
MCH: 29.6 pg (ref 26.6–33.0)
MCHC: 33.8 g/dL (ref 31.5–35.7)
MCV: 87 fL (ref 79–97)
Monocytes Absolute: 0.8 10*3/uL (ref 0.1–0.9)
Monocytes: 14 %
Neutrophils Absolute: 3.2 10*3/uL (ref 1.4–7.0)
Neutrophils: 59 %
Platelets: 299 10*3/uL (ref 150–450)
RBC: 4.5 x10E6/uL (ref 4.14–5.80)
RDW: 15 % (ref 11.6–15.4)
WBC: 5.5 10*3/uL (ref 3.4–10.8)

## 2020-05-26 LAB — COMPREHENSIVE METABOLIC PANEL
ALT: 14 IU/L (ref 0–44)
AST: 14 IU/L (ref 0–40)
Albumin/Globulin Ratio: 2 (ref 1.2–2.2)
Albumin: 4.3 g/dL (ref 3.7–4.7)
Alkaline Phosphatase: 53 IU/L (ref 44–121)
BUN/Creatinine Ratio: 18 (ref 10–24)
BUN: 18 mg/dL (ref 8–27)
Bilirubin Total: 0.2 mg/dL (ref 0.0–1.2)
CO2: 25 mmol/L (ref 20–29)
Calcium: 8.9 mg/dL (ref 8.6–10.2)
Chloride: 101 mmol/L (ref 96–106)
Creatinine, Ser: 0.98 mg/dL (ref 0.76–1.27)
GFR calc Af Amer: 85 mL/min/{1.73_m2} (ref >59)
GFR calc non Af Amer: 74 mL/min/{1.73_m2} (ref >59)
Globulin, Total: 2.1 g/dL (ref 1.5–4.5)
Glucose: 86 mg/dL (ref 65–99)
Potassium: 4.9 mmol/L (ref 3.5–5.2)
Sodium: 136 mmol/L (ref 134–144)
Total Protein: 6.4 g/dL (ref 6.0–8.5)

## 2020-05-26 LAB — TROPONIN T: Troponin T (Highly Sensitive): 8 ng/L (ref 0–22)

## 2020-05-26 NOTE — Progress Notes (Signed)
Subjective:  Patient ID: Jeffery Turner, male    DOB: Oct 16, 1940  Age: 79 y.o. MRN: [id number]  CC: Chest Pain  HPI:  Loc is a 79 year old Caucasian male that presents to the office lobby with c/o chest pain.He rates chest pain 6 out of 10 with mild dyspnea. Onset of symptoms was 3 days ago. States pain and dyspnea are aggravated with activity and relieved with rest. States he has not taken any medication to relieve symptoms. States he has NTG 0.4 mg but did not take it. States he has chronic dyspnea. He is a former cigarette smoker, quit 2001. States he has chronic dyspnea. Pt has a known long cardiac history. S/P CABG X 3 vessels on 02/05/20. History of previous MI, CAD, hyperlipidemia, and angina. Pt was recently diagnosed with hypothyroidism, began Levothyroxine 25 mcg on 05/07/20. Pt was instructed to take Levothyroxine first thing in the morning without other medications. He misunderstood. He stopped all medications except Levothyroxine since 05/07/20. He rates chest pain 6 out of 10 with mild dyspnea. He is a former cigarette smoker, quit 2001. States he has chronic dyspnea. States he has not taken any medication to relieve symptoms. States he has NTG 0.4 mg at home but did not take it.    Current Outpatient Medications on File Prior to Visit  Medication Sig Dispense Refill  . aspirin EC 325 MG tablet Take 1 tablet (325 mg total) by mouth daily. 90 tablet 0  . Calcium-Magnesium-Zinc (CAL-MAG-ZINC PO) Take 1 tablet by mouth daily.     . COCONUT OIL PO Take 30 mLs by mouth daily.     Marland Kitchen levothyroxine (SYNTHROID) 25 MCG tablet Take 1 tablet (25 mcg total) by mouth daily before breakfast. 60 tablet 0  . metoprolol tartrate (LOPRESSOR) 25 MG tablet Take 1 tablet (25 mg total) by mouth 2 (two) times daily. 60 tablet 3  . mometasone-formoterol (DULERA) 100-5 MCG/ACT AERO Inhale 2 puffs into the lungs 2 (two) times daily. 1 each 1  . Multiple Vitamin (MULTIVITAMIN WITH MINERALS) TABS tablet Take 1  tablet by mouth daily.     . nitroGLYCERIN (NITROSTAT) 0.4 MG SL tablet Place 1 tablet (0.4 mg total) under the tongue every 5 (five) minutes as needed. If require 2, recommend call 911. (Patient taking differently: Place 0.4 mg under the tongue every 5 (five) minutes as needed for chest pain. If require 2, recommend call 911.) 50 tablet 0  . OVER THE COUNTER MEDICATION Place 1 drop into both eyes 3 (three) times daily as needed (eye irritation.). Colloidal Silver Eye Drops    . rosuvastatin (CRESTOR) 10 MG tablet Take 1 tablet (10 mg total) by mouth daily. 30 tablet 3   No current facility-administered medications on file prior to visit.   Past Medical History:  Diagnosis Date  . Anemia   . Angina pectoris ([place]) 02/06/2018  . Angina, class III ([place]) 02/06/2018  . Anginal equivalent ([place]) 09/19/2019  . CAD (coronary artery disease)    s/p stent to CFX 1997 2/2 MI s/p DES to [facility] and DES mLAD 07/14/10 2/2 [place] cath 07/14/10: dLAD 60%; CFX stent ok; OM1 30-40%; pRCA 30%; PDA 40%; EF 60%  . Constant exophthalmos    right  . Coronary artery disease of native artery of native heart with stable angina pectoris ([place]) 07/21/2010   Qualifier: Diagnosis of  By: Denny Peon, CMA, [name]    . Epistaxis 07/21/2010   Qualifier: Diagnosis of  By: Jorene Minors, Scott    .  Hearing loss    right eye - no hearing aids  . Hyperlipidemia   . MI (myocardial infarction) ([place])   . Mixed hyperlipidemia 07/21/2010   Qualifier: Diagnosis of  By: Orville Govern CMA, Arbie Cookey    . Old myocardial infarction 07/21/2010   Qualifier: Diagnosis of  By: Hester Mates, Carol    . Osteoarthritis   . OSTEOARTHRITIS 07/21/2010   Qualifier: Diagnosis of  By: Ronne Binning    . 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 Shortness of breath 07/21/2010   Qualifier: Diagnosis of  By: Jorene Minors, Scott    . TOBACCO USE, QUIT 07/21/2010   Qualifier: Diagnosis of  By: Orville Govern CMA, Arbie Cookey    . Transient visual loss, right    wears glasses  . UNSTABLE ANGINA 07/21/2010    Qualifier: Diagnosis of  By: Ronne Binning     Past Surgical History:  Procedure Laterality Date  . CARDIAC CATHETERIZATION    . CORONARY ARTERY BYPASS GRAFT N/A 02/05/2020   Procedure: CORONARY ARTERY BYPASS GRAFTING (CABG), ON PUMP, TIMES THREE, USING LEFT INTERNAL MAMMARY ARTERY TO LEFT ANTERIOR DESCENDING ARTERY, REVERSE SAPHENOUS VEIN GRAFT TO POSTERIOR DESCENDING ARTERY AND CIRCUMFLEX ARTERY;  Surgeon: Grace Isaac, MD;  Location: [facility];  Service: Open Heart Surgery;  Laterality: N/A;  . ENDOVEIN HARVEST OF GREATER SAPHENOUS VEIN Right 02/05/2020   Procedure: ENDOVEIN HARVEST OF GREATER SAPHENOUS VEIN;  Surgeon: Grace Isaac, MD;  Location: [facility];  Service: Open Heart Surgery;  Laterality: Right;  . LEFT HEART CATH AND CORONARY ANGIOGRAPHY N/A 01/25/2020   Procedure: LEFT HEART CATH AND CORONARY ANGIOGRAPHY;  Surgeon: Leonie Man, MD;  Location: [facility];  Service: Cardiovascular;  Laterality: N/A;  . TEE WITHOUT CARDIOVERSION N/A 02/05/2020   Procedure: TRANSESOPHAGEAL ECHOCARDIOGRAM (TEE);  Surgeon: Grace Isaac, MD;  Location: [facility];  Service: Open Heart Surgery;  Laterality: N/A;  . TONSILLECTOMY AND ADENOIDECTOMY    . WISDOM TOOTH EXTRACTION      Family History  Problem Relation Age of Onset  . Coronary artery disease Other   . Heart failure Mother   . Alzheimer's disease Mother    Social History   Socioeconomic History  . Marital status: Married    Spouse name: Not on file  . Number of children: Not on file  . Years of education: Not on file  . Highest education level: Not on file  Occupational History  . Not on file  Tobacco Use  . Smoking status: Former Smoker    Packs/day: 1.50    Years: 37.00    Pack years: 55.50    Types: Cigarettes    Quit date: 05/2000    Years since quitting: 20.0  . Smokeless tobacco: Never Used  Vaping Use  . Vaping Use: Never used  Substance and Sexual Activity  . Alcohol use: No  . Drug use: No  .  Sexual activity: Not Currently  Other Topics Concern  . Not on file  Social History Narrative   He lives in [place] with family.  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         Social Determinants of Health   Financial Resource Strain: Not on file  Food Insecurity: Not on file  Transportation Needs: Not on file  Physical Activity: Sufficiently Active  . Days of Exercise  per Week: 5 days  . Minutes of Exercise per Session: 30 min  Stress: Stress Concern Present  . Feeling of Stress : To some extent  Social Connections: Unknown  . Frequency of Communication with Friends and Family: Not on file  . Frequency of Social Gatherings with Friends and Family: Not on file  . Attends Religious Services: Not on file  . Active Member of Clubs or Organizations: Not on file  . Attends Archivist Meetings: Not on file  . Marital Status: Married    Review of Systems  Constitutional: Negative for fatigue and fever.  HENT: Positive for hearing loss. Negative for congestion, ear pain, sinus pressure and sore throat.   Eyes: Negative for pain.  Respiratory: Positive for chest tightness and shortness of breath (mild). Negative for cough and wheezing.   Cardiovascular: Positive for chest pain. Negative for palpitations.  Gastrointestinal: Negative for abdominal pain, constipation, diarrhea, nausea and vomiting.  Genitourinary: Negative for dysuria and hematuria.  Musculoskeletal: Negative for arthralgias, back pain, joint swelling and myalgias.  Skin: Negative for rash.  Neurological: Negative for dizziness, weakness and headaches.  Psychiatric/Behavioral: Negative for dysphoric mood. The patient is not nervous/anxious.      Objective:  BP 128/76 (BP Location: Left Arm, Patient Position: Sitting)   Pulse 64   Temp (!) 97.2 F (36.2 C) (Temporal)   Ht 5\' 7"  (1.702 m)   Wt 204 lb  (92.5 kg)   SpO2 99%   BMI 31.95 kg/m   BP/Weight 05/26/2020 04/21/2020 98/33/8250  Systolic BP 539 767 341  Diastolic BP 76 60 60  Wt. (Lbs) 204 208 210  BMI 31.95 32.58 32.89    Physical Exam Vitals reviewed.  Constitutional:      Appearance: Normal appearance.  HENT:     Head: Normocephalic.     Right Ear: Tympanic membrane and external ear normal.     Left Ear: Tympanic membrane and external ear normal.     Nose: Nose normal.     Mouth/Throat:     Mouth: Mucous membranes are moist.  Neck:     Vascular: No JVD.     Trachea: No tracheal deviation.  Cardiovascular:     Rate and Rhythm: Normal rate and regular rhythm.     Pulses: Normal pulses.     Heart sounds: Heart sounds are distant.  Pulmonary:     Effort: Pulmonary effort is normal.     Breath sounds: Normal breath sounds.  Abdominal:     General: Abdomen is flat. Bowel sounds are normal.     Palpations: Abdomen is soft.  Musculoskeletal:     Cervical back: Neck supple.  Skin:    General: Skin is warm and dry.     Capillary Refill: Capillary refill takes less than 2 seconds.  Neurological:     General: No focal deficit present.     Mental Status: He is alert and oriented to person, place, and time.  Psychiatric:        Mood and Affect: Mood normal.        Behavior: Behavior normal.           Lab Results  Component Value Date   WBC 8.8 02/21/2020   HGB 10.4 (L) 02/21/2020   HCT 32.0 (L) 02/21/2020   PLT 578 (H) 02/21/2020   GLUCOSE 117 (H) 03/28/2020   CHOL 124 03/28/2020   TRIG 163 (H) 03/28/2020   HDL 40 03/28/2020   LDLDIRECT 152.5 10/05/2010   [place]  56 03/28/2020   ALT 15 03/28/2020   AST 16 03/28/2020   NA 135 03/28/2020   K 4.5 03/28/2020   CL 98 03/28/2020   CREATININE 1.25 03/28/2020   BUN 19 03/28/2020   CO2 22 03/28/2020   TSH 13.200 (H) 05/05/2020   INR 1.4 (H) 02/05/2020   HGBA1C 6.0 (H) 04/11/2020      Assessment & Plan:   1. Angina pectoris (HCC)-Vital signs were  stable. He is not in acute distress.   -EKG-stable, sinus rhythm with first degree block - Troponin T, STAT ([facility]) - Comprehensive metabolic panel  2. S/P CABG x 3 - Comprehensive metabolic panel  3. History of anemia - CBC with Differential  4. History of MI (myocardial infarction) - Comprehensive metabolic panel  5. Coronary artery disease of native artery of native heart with stable angina pectoris (HCC) - Troponin T, STAT ([facility]) - Comprehensive metabolic panel    Resume all prescribed medication: Metoprolol 25 mg twice daily Aspirin 325 mg daily, Crestor 20 mg daily Take Nitroglycerin 0.4mg  as needed for chest pain Seek emergency care for severe chest pain or shortness of breath Keep follow-up with Dr Bettina Gavia, cardiologist in January Keep follow-up with Dr Tobie Turner in February Return to office December 28th for repeat thyroid labs   Follow-up: As needed, if symptoms worsen of fail to improve  An After Visit Summary was printed and given to the patient.  Rip Harbour, NP [place] [phone]

## 2020-05-26 NOTE — Patient Instructions (Addendum)
Resume all prescribed medication: Metoprolol 25 mg twice daily Aspirin 325 mg daily, Crestor 20 mg daily Take Nitroglycerin 0.4mg  as needed for chest pain Seek emergency care for severe chest pain or shortness of breath Keep follow-up with Dr Bettina Gavia, cardiologist in January Keep follow-up with Dr Tobie Poet in February Return to office December 28th for repeat thyroid labs    Angina  Angina is very bad discomfort or pain in the chest, neck, arm, jaw, or back. The discomfort is caused by a lack of blood in the middle layer of the heart wall (myocardium). What are the causes? This condition is caused by a buildup of fat and cholesterol (plaque) in your arteries (atherosclerosis). This buildup narrows the arteries and makes it hard for blood to flow. What increases the risk? You are more likely to develop this condition if:  You have high levels of cholesterol in your blood.  You have high blood pressure (hypertension).  You have diabetes.  You have a family history of heart disease.  You are not active, or you do not exercise enough.  You feel sad (depressed).  You have been treated with high energy rays (radiation) on the left side of your chest. Other risk factors are:  Using tobacco.  Being very overweight (obese).  Eating a diet high in unhealthy fats (saturated fats).  Having stress, or being exposed to things that cause stress.  Using drugs, such as cocaine. Women have a greater risk for angina if:  They are older than 65.  They have stopped having their period (are in postmenopause). What are the signs or symptoms? Common symptoms of this condition in both men and women may include:  Chest pain, which may: ? Feel like a crushing or squeezing in the chest. ? Feel like a tightness, pressure, fullness, or heaviness in the chest. ? Last for more than a few minutes at a time. ? Stop and come back (recur) after a few minutes.  Pain in the neck, arm, jaw, or  back.  Heartburn or upset stomach (indigestion) for no reason.  Being short of breath.  Feeling sick to your stomach (nauseous).  Sudden cold sweats. Women and people with diabetes may have other symptoms that are not usual, such as feeling:  Tired (fatigue).  Worried or nervous (anxious) for no reason.  Weak for no reason.  Dizzy or passing out (fainting). How is this treated? This condition may be treated with:  Medicines. These are given to: ? Prevent blood clots. ? Prevent heart attack. ? Relax blood vessels and improve blood flow to the heart (nitrates). ? Reduce blood pressure. ? Improve the pumping action of the heart. ? Reduce fat and cholesterol in the blood.  A procedure to widen a narrowed or blocked artery in the heart (angioplasty).  Surgery to allow blood to go around a blocked artery (coronary artery bypass surgery). Follow these instructions at home: Medicines  Take over-the-counter and prescription medicines only as told by your doctor.  Do not take these medicines unless your doctor says that you can: ? NSAIDs. These include:  Ibuprofen.  Naproxen. ? Vitamin supplements that have vitamin A, vitamin E, or both. ? Hormone therapy that contains estrogen with or without progestin. Eating and drinking   Eat a heart-healthy diet that includes: ? Lots of fresh fruits and vegetables. ? Whole grains. ? Low-fat (lean) protein. ? Low-fat dairy products.  Follow instructions from your doctor about what you cannot eat or drink. Activity  Follow an  exercise program that your doctor tells you.  Talk with your doctor about joining a program to help improve the health of your heart (cardiac rehab).  When you feel tired, take a break. Plan breaks if you know you are going to feel tired. Lifestyle   Do not use any products that contain nicotine or tobacco. This includes cigarettes, e-cigarettes, and chewing tobacco. If you need help quitting, ask your  doctor.  If your doctor says you can drink alcohol: ? Limit how much you use to:  0-1 drink a day for women who are not pregnant.  0-2 drinks a day for men. ? Be aware of how much alcohol is in your drink. In the [place] one drink equals:  One 12 oz bottle of beer (355 mL).  One 5 oz glass of wine (148 mL).  One 1 oz glass of hard liquor (44 mL). General instructions  Stay at a healthy weight. If your doctor tells you to do so, work with him or her to lose weight.  Learn to deal with stress. If you need help, ask your doctor.  Keep your vaccines up to date. Get a flu shot every year.  Talk with your doctor if you feel sad. Take a screening test to see if you are at risk for depression.  Work with your doctor to manage any other health problems that you have. These may include diabetes or high blood pressure.  Keep all follow-up visits as told by your doctor. This is important. Get help right away if:  You have pain in your chest, neck, arm, jaw, or back, and the pain: ? Lasts more than a few minutes. ? Comes back. ? Does not get better after you take medicine under your tongue (sublingual nitroglycerin). ? Keeps getting worse. ? Comes more often.  You have any of these problems for no reason: ? Sweating a lot. ? Heartburn or upset stomach. ? Shortness of breath. ? Trouble breathing. ? Feeling sick to your stomach. ? Throwing up (vomiting). ? Feeling more tired than normal. ? Feeling nervous or worrying more than normal. ? Weakness.  You are suddenly dizzy or light-headed.  You pass out. These symptoms may be an emergency. Do not wait to see if the symptoms will go away. Get medical help right away. Call your local emergency services (911 in the [place] Do not drive yourself to the hospital. Summary  Angina is very bad discomfort or pain in the chest, neck, arm, neck, or back.  Symptoms include chest pain, heartburn or upset stomach for no reason, and shortness of  breath.  Women or people with diabetes may have symptoms that are not usual, such as feeling nervous or worried for no reason, weak for no reason, or tired.  Take all medicines only as told by your doctor.  You should eat a heart-healthy diet and follow an exercise program. Nitroglycerin sublingual tablets What is this medicine? NITROGLYCERIN (nye troe GLI ser in) is a type of vasodilator. It relaxes blood vessels, increasing the blood and oxygen supply to your heart. This medicine is used to relieve chest pain caused by angina. It is also used to prevent chest pain before activities like climbing stairs, going outdoors in cold weather, or sexual activity. This medicine may be used for other purposes; ask your health care provider or pharmacist if you have questions. COMMON BRAND NAME(S): Nitroquick, Nitrostat, Nitrotab What should I tell my health care provider before I take this medicine? They need  to know if you have any of these conditions: anemia head injury, recent stroke, or bleeding in the brain liver disease previous heart attack an unusual or allergic reaction to nitroglycerin, other medicines, foods, dyes, or preservatives pregnant or trying to get pregnant breast-feeding How should I use this medicine? Take this medicine by mouth as needed. At the first sign of an angina attack (chest pain or tightness) place one tablet under your tongue. You can also take this medicine 5 to 10 minutes before an event likely to produce chest pain. Follow the directions on the prescription label. Let the tablet dissolve under the tongue. Do not swallow whole. Replace the dose if you accidentally swallow it. It will help if your mouth is not dry. Saliva around the tablet will help it to dissolve more quickly. Do not eat or drink, smoke or chew tobacco while a tablet is dissolving. If you are not better within 5 minutes after taking ONE dose of nitroglycerin, call 9-1-1 immediately to seek emergency  medical care. Do not take more than 3 nitroglycerin tablets over 15 minutes. If you take this medicine often to relieve symptoms of angina, your doctor or health care professional may provide you with different instructions to manage your symptoms. If symptoms do not go away after following these instructions, it is important to call 9-1-1 immediately. Do not take more than 3 nitroglycerin tablets over 15 minutes. Talk to your pediatrician regarding the use of this medicine in children. Special care may be needed. Overdosage: If you think you have taken too much of this medicine contact a poison control center or emergency room at once. NOTE: This medicine is only for you. Do not share this medicine with others. What if I miss a dose? This does not apply. This medicine is only used as needed. What may interact with this medicine? Do not take this medicine with any of the following medications: certain migraine medicines like ergotamine and dihydroergotamine (DHE) medicines used to treat erectile dysfunction like sildenafil, tadalafil, and vardenafil riociguat This medicine may also interact with the following medications: alteplase aspirin heparin medicines for high blood pressure medicines for mental depression other medicines used to treat angina phenothiazines like chlorpromazine, mesoridazine, prochlorperazine, thioridazine This list may not describe all possible interactions. Give your health care provider a list of all the medicines, herbs, non-prescription drugs, or dietary supplements you use. Also tell them if you smoke, drink alcohol, or use illegal drugs. Some items may interact with your medicine. What should I watch for while using this medicine? Tell your doctor or health care professional if you feel your medicine is no longer working. Keep this medicine with you at all times. Sit or lie down when you take your medicine to prevent falling if you feel dizzy or faint after using  it. Try to remain calm. This will help you to feel better faster. If you feel dizzy, take several deep breaths and lie down with your feet propped up, or bend forward with your head resting between your knees. You may get drowsy or dizzy. Do not drive, use machinery, or do anything that needs mental alertness until you know how this drug affects you. Do not stand or sit up quickly, especially if you are an older patient. This reduces the risk of dizzy or fainting spells. Alcohol can make you more drowsy and dizzy. Avoid alcoholic drinks. Do not treat yourself for coughs, colds, or pain while you are taking this medicine without asking your doctor  or health care professional for advice. Some ingredients may increase your blood pressure. What side effects may I notice from receiving this medicine? Side effects that you should report to your doctor or health care professional as soon as possible: blurred vision dry mouth skin rash sweating the feeling of extreme pressure in the head unusually weak or tired Side effects that usually do not require medical attention (report to your doctor or health care professional if they continue or are bothersome): flushing of the face or neck headache irregular heartbeat, palpitations nausea, vomiting This list may not describe all possible side effects. Call your doctor for medical advice about side effects. You may report side effects to FDA at 1-800-FDA-1088. Where should I keep my medicine? Keep out of the reach of children. Store at room temperature between 20 and 25 degrees C (68 and 77 degrees F). Store in [employer]. Protect from light and moisture. Keep tightly closed. Throw away any unused medicine after the expiration date. NOTE: This sheet is a summary. It may not cover all possible information. If you have questions about this medicine, talk to your doctor, pharmacist, or health care provider.  2020 Elsevier/Gold Standard (2013-03-29  17:57:36)   This information is not intended to replace advice given to you by your health care provider. Make sure you discuss any questions you have with your health care provider. Document Revised: 01/16/2018 Document Reviewed: 01/16/2018 Elsevier Patient Education  [place].

## 2020-05-28 DIAGNOSIS — Z951 Presence of aortocoronary bypass graft: Secondary | ICD-10-CM | POA: Diagnosis not present

## 2020-05-28 DIAGNOSIS — Z955 Presence of coronary angioplasty implant and graft: Secondary | ICD-10-CM | POA: Diagnosis not present

## 2020-05-28 DIAGNOSIS — Z1211 Encounter for screening for malignant neoplasm of colon: Secondary | ICD-10-CM | POA: Diagnosis not present

## 2020-05-30 DIAGNOSIS — Z955 Presence of coronary angioplasty implant and graft: Secondary | ICD-10-CM | POA: Diagnosis not present

## 2020-05-30 DIAGNOSIS — Z951 Presence of aortocoronary bypass graft: Secondary | ICD-10-CM | POA: Diagnosis not present

## 2020-06-10 ENCOUNTER — Other Ambulatory Visit: Payer: Self-pay

## 2020-06-10 ENCOUNTER — Other Ambulatory Visit: Payer: Medicare HMO

## 2020-06-10 DIAGNOSIS — E039 Hypothyroidism, unspecified: Secondary | ICD-10-CM

## 2020-06-11 ENCOUNTER — Other Ambulatory Visit: Payer: Self-pay | Admitting: Nurse Practitioner

## 2020-06-11 ENCOUNTER — Other Ambulatory Visit: Payer: Self-pay | Admitting: Legal Medicine

## 2020-06-11 DIAGNOSIS — E039 Hypothyroidism, unspecified: Secondary | ICD-10-CM

## 2020-06-11 LAB — TSH: TSH: 11.8 u[IU]/mL — ABNORMAL HIGH (ref 0.450–4.500)

## 2020-06-11 MED ORDER — LEVOTHYROXINE SODIUM 50 MCG PO TABS: 50.0000 ug | ORAL_TABLET | Freq: Every day | ORAL | 0 refills | Status: DC

## 2020-06-12 ENCOUNTER — Other Ambulatory Visit: Payer: Self-pay

## 2020-06-16 LAB — COLOGUARD: COLOGUARD: POSITIVE — AB

## 2020-06-17 ENCOUNTER — Other Ambulatory Visit: Payer: Self-pay

## 2020-06-17 MED ORDER — METOPROLOL TARTRATE 25 MG PO TABS
25.0000 mg | ORAL_TABLET | Freq: Two times a day (BID) | ORAL | 3 refills | Status: DC
Start: 2020-06-17 — End: 2020-06-18

## 2020-06-18 ENCOUNTER — Other Ambulatory Visit: Payer: Self-pay

## 2020-06-18 MED ORDER — METOPROLOL TARTRATE 25 MG PO TABS: 25.0000 mg | ORAL_TABLET | Freq: Two times a day (BID) | ORAL | 2 refills | Status: DC

## 2020-06-18 NOTE — Telephone Encounter (Signed)
Refill sent to [facility] for Metoprolol Tartrate 25 mg.

## 2020-06-20 MED ORDER — METOPROLOL TARTRATE 25 MG PO TABS: 25.0000 mg | ORAL_TABLET | Freq: Two times a day (BID) | ORAL | 2 refills | Status: DC

## 2020-06-20 NOTE — Addendum Note (Signed)
Addended by: Wilman Tucker L on: 06/20/2020 11:12 AM   Modules accepted: Orders

## 2020-06-30 NOTE — Progress Notes (Deleted)
{Choose 1 Note Type (Video or Telephone):[phone]}    Date:  06/30/2020   ID:  Jeffery Turner, DOB [birth year], MRN [id number] The patient was identified using 2 identifiers.  {Patient Location:[phone]::"Home"} {Provider Location:[phone]::"Home Office"}  PCP:  Jeffery Brome, MD  Cardiologist:  Jeffery More, MD *** Electrophysiologist:  None   Evaluation Performed:  {Choose Visit Type:[id number]::"Follow-Up Visit"}  Chief Complaint:  ***  History of Present Illness:    Jeffery Turner is a 80 y.o. male with CAD with PCI of all native vessels left circumflex 1997 LAD and right coronary artery January 2012 severe hyperlipidemia and unstable angina pectoris with coronary angiography showing severe three-vessel coronary artery disease EF 50 to 55% and CABG [facility] Dr. Pia Turner 02/05/2020.  Postoperatively he had atrial fibrillation treated with intravenous and subsequently oral amiodarone. He was last seen 03/28/2020 and amiodarone was discontinued.Marland Kitchen  He was seen by his PCP Jeffery Belfast NP 05/26/2020 with chest pain EKG with minor non specific T waves and HSTroponin was normal. The patient {does/does not:200015} have symptoms concerning for COVID-19 infection (fever, chills, cough, or new shortness of breath).    Past Medical History:  Diagnosis Date  . Anemia   . Angina pectoris ([place]) 02/06/2018  . Angina, class III ([place]) 02/06/2018  . Anginal equivalent ([place]) 09/19/2019  . CAD (coronary artery disease)    s/p stent to CFX 1997 2/2 MI s/p DES to [facility] and DES mLAD 07/14/10 2/2 [place] cath 07/14/10: dLAD 60%; CFX stent ok; OM1 30-40%; pRCA 30%; PDA 40%; EF 60%  . Constant exophthalmos    right  . Coronary artery disease of native artery of native heart with stable angina pectoris ([place]) 07/21/2010   Qualifier: Diagnosis of  By: Denny Peon, CMA, [name]    . Epistaxis 07/21/2010   Qualifier: Diagnosis of  By: Jorene Minors, Scott    . Hearing loss    right eye - no hearing aids  .  Hyperlipidemia   . MI (myocardial infarction) ([place])   . Mixed hyperlipidemia 07/21/2010   Qualifier: Diagnosis of  By: Orville Govern CMA, Arbie Cookey    . Old myocardial infarction 07/21/2010   Qualifier: Diagnosis of  By: Hester Mates, Carol    . Osteoarthritis   . OSTEOARTHRITIS 07/21/2010   Qualifier: Diagnosis of  By: Ronne Binning    . 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 Shortness of breath 07/21/2010   Qualifier: Diagnosis of  By: Jorene Minors, Scott    . TOBACCO USE, QUIT 07/21/2010   Qualifier: Diagnosis of  By: Orville Govern CMA, Arbie Cookey    . Transient visual loss, right    wears glasses  . UNSTABLE ANGINA 07/21/2010   Qualifier: Diagnosis of  By: Ronne Binning     Past Surgical History:  Procedure Laterality Date  . CARDIAC CATHETERIZATION    . CORONARY ARTERY BYPASS GRAFT N/A 02/05/2020   Procedure: CORONARY ARTERY BYPASS GRAFTING (CABG), ON PUMP, TIMES THREE, USING LEFT INTERNAL MAMMARY ARTERY TO LEFT ANTERIOR DESCENDING ARTERY, REVERSE SAPHENOUS VEIN GRAFT TO POSTERIOR DESCENDING ARTERY AND CIRCUMFLEX ARTERY;  Surgeon: Grace Isaac, MD;  Location: [place];  Service: Open Heart Surgery;  Laterality: N/A;  . ENDOVEIN HARVEST OF GREATER SAPHENOUS VEIN Right 02/05/2020   Procedure: ENDOVEIN HARVEST OF GREATER SAPHENOUS VEIN;  Surgeon: Grace Isaac, MD;  Location: [place];  Service: Open Heart Surgery;  Laterality: Right;  . LEFT HEART CATH AND CORONARY ANGIOGRAPHY N/A 01/25/2020   Procedure: LEFT HEART CATH AND CORONARY ANGIOGRAPHY;  Surgeon: Leonie Man, MD;  Location: [place] [facility];  Service: Cardiovascular;  Laterality: N/A;  . TEE WITHOUT CARDIOVERSION N/A 02/05/2020   Procedure: TRANSESOPHAGEAL ECHOCARDIOGRAM (TEE);  Surgeon: Grace Isaac, MD;  Location: [place];  Service: Open Heart Surgery;  Laterality: N/A;  . TONSILLECTOMY AND ADENOIDECTOMY    . WISDOM TOOTH EXTRACTION       No outpatient medications have been marked as taking for the 07/01/20 encounter (Appointment) with Richardo Priest, MD.     Allergies:   Bactrim [sulfamethoxazole-trimethoprim], Codeine, and Statins   Social History   Tobacco Use  . Smoking status: Former Smoker    Packs/day: 1.50    Years: 37.00    Pack years: 55.50    Types: Cigarettes    Quit date: 05/2000    Years since quitting: 20.1  . Smokeless tobacco: Never Used  Vaping Use  . Vaping Use: Never used  Substance Use Topics  . Alcohol use: No  . Drug use: No     Family Hx: The patient's family history includes Alzheimer's disease in his mother; Coronary artery disease in an other family member; Heart failure in his mother.  ROS:   Please see the history of present illness.    *** All other systems reviewed and are negative.   Prior CV studies:   The following studies were reviewed today:  ***  Labs/Other Tests and Data Reviewed:    EKG:  {EKG/Telemetry Strips [phone]}  Recent Labs: 02/08/2020: Magnesium 1.8 03/28/2020: NT-Pro BNP 163 05/26/2020: ALT 14; BUN 18; Creatinine, Ser 0.98; Hemoglobin 13.3; Platelets 299; Potassium 4.9; Sodium 136 06/10/2020: TSH 11.800   Recent Lipid Panel Lab Results  Component Value Date/Time   CHOL 124 03/28/2020 10:24 AM   TRIG 163 (H) 03/28/2020 10:24 AM   HDL 40 03/28/2020 10:24 AM   CHOLHDL 3.1 03/28/2020 10:24 AM   CHOLHDL 4 04/08/2011 12:02 PM   LDLCALC 56 03/28/2020 10:24 AM   LDLDIRECT 152.5 10/05/2010 08:32 AM    Wt Readings from Last 3 Encounters:  05/26/20 204 lb (92.5 kg)  04/21/20 208 lb (94.3 kg)  04/10/20 210 lb (95.3 kg)     Risk Assessment/Calculations:   {Does this patient have ATRIAL FIBRILLATION?:[phone]}  Objective:    Vital Signs:  There were no vitals taken for this visit.   {HeartCare Virtual Exam (Optional):[phone]::"VITAL SIGNS:  reviewed"}  ASSESSMENT & PLAN:    1. ***   {Are you ordering a CV Procedure (e.g. stress test, cath, DCCV, TEE, etc)?   Press F2        :[id number]}    COVID-19 Education: The signs and  symptoms of COVID-19 were discussed with the patient and how to seek care for testing (follow up with PCP or arrange E-visit).  ***The importance of social distancing was discussed today.  Time:   Today, I have spent *** minutes with the patient with telehealth technology discussing the above problems.     Medication Adjustments/Labs and Tests Ordered: Current medicines are reviewed at length with the patient today.  Concerns regarding medicines are outlined above.   Tests Ordered: No orders of the defined types were placed in this encounter.   Medication Changes: No orders of the defined types were placed in this encounter.   Follow Up:  {F/U Format:[phone]} {follow up:15908}  Signed, Jeffery More, MD  06/30/2020 11:57 AM    [facility]

## 2020-07-01 ENCOUNTER — Ambulatory Visit: Payer: Medicare HMO | Admitting: Cardiology

## 2020-07-15 ENCOUNTER — Ambulatory Visit: Payer: Medicare HMO | Admitting: Family Medicine

## 2020-07-17 DIAGNOSIS — L820 Inflamed seborrheic keratosis: Secondary | ICD-10-CM | POA: Diagnosis not present

## 2020-07-21 NOTE — Progress Notes (Signed)
Subjective:  Patient ID: Jeffery Turner, male    DOB: [birth year]  Age: 80 y.o. MRN: [id number]  Chief Complaint  Patient presents with  . Hypothyroidism  . Hyperlipidemia    HPI HypoThyroid-Synthroid 50 mcg once daily HTN with heart disease. Having symptoms of claudication.On Metoprolol and aspirin.  Hyperlipidemia - not taking rosuvastatin because he is afraid of them. He had no side effects when he took them..  Asthma: on Dulera.   Current Outpatient Medications on File Prior to Visit  Medication Sig Dispense Refill  . aspirin EC 325 MG tablet Take 1 tablet (325 mg total) by mouth daily. 90 tablet 0  . Calcium-Magnesium-Zinc (CAL-MAG-ZINC PO) Take 1 tablet by mouth daily.    . COCONUT OIL PO Take 30 mLs by mouth daily.    Marland Kitchen levothyroxine (SYNTHROID) 50 MCG tablet Take 1 tablet (50 mcg total) by mouth daily. 45 tablet 0  . metoprolol tartrate (LOPRESSOR) 25 MG tablet Take 1 tablet (25 mg total) by mouth 2 (two) times daily. 180 tablet 2  . Multiple Vitamin (MULTIVITAMIN WITH MINERALS) TABS tablet Take 1 tablet by mouth daily.    . nitroGLYCERIN (NITROSTAT) 0.4 MG SL tablet Place 1 tablet (0.4 mg total) under the tongue every 5 (five) minutes as needed. If require 2, recommend call 911. 50 tablet 0  . mometasone-formoterol (DULERA) 100-5 MCG/ACT AERO Inhale 2 puffs into the lungs 2 (two) times daily. (Patient not taking: No sig reported) 1 each 1   No current facility-administered medications on file prior to visit.   Past Medical History:  Diagnosis Date  . Anemia   . Angina pectoris ([place]) 02/06/2018  . Angina, class III ([place]) 02/06/2018  . Anginal equivalent ([place]) 09/19/2019  . CAD (coronary artery disease)    s/p stent to CFX 1997 2/2 MI s/p DES to [facility] and DES mLAD 07/14/10 2/2 [place] cath 07/14/10: dLAD 60%; CFX stent ok; OM1 30-40%; pRCA 30%; PDA 40%; EF 60%  . Constant exophthalmos    right  . Coronary artery disease of native artery of native heart with stable angina pectoris  ([place]) 07/21/2010   Qualifier: Diagnosis of  By: Denny Peon, CMA, [name]    . Epistaxis 07/21/2010   Qualifier: Diagnosis of  By: Jorene Minors, Scott    . Hearing loss    right eye - no hearing aids  . Hyperlipidemia   . MI (myocardial infarction) ([place])   . Mixed hyperlipidemia 07/21/2010   Qualifier: Diagnosis of  By: Orville Govern CMA, Arbie Cookey    . Old myocardial infarction 07/21/2010   Qualifier: Diagnosis of  By: Hester Mates, Carol    . Osteoarthritis   . OSTEOARTHRITIS 07/21/2010   Qualifier: Diagnosis of  By: Ronne Binning    . 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 Shortness of breath 07/21/2010   Qualifier: Diagnosis of  By: Jorene Minors, Scott    . TOBACCO USE, QUIT 07/21/2010   Qualifier: Diagnosis of  By: Orville Govern CMA, Arbie Cookey    . Transient visual loss, right    wears glasses  . UNSTABLE ANGINA 07/21/2010   Qualifier: Diagnosis of  By: Ronne Binning     Past Surgical History:  Procedure Laterality Date  . CARDIAC CATHETERIZATION    . CORONARY ARTERY BYPASS GRAFT N/A 02/05/2020   Procedure: CORONARY ARTERY BYPASS GRAFTING (CABG), ON PUMP, TIMES THREE, USING LEFT INTERNAL MAMMARY ARTERY TO LEFT ANTERIOR DESCENDING ARTERY, REVERSE SAPHENOUS VEIN GRAFT TO POSTERIOR DESCENDING ARTERY AND CIRCUMFLEX ARTERY;  Surgeon:  Grace Isaac, MD;  Location: [place];  Service: Open Heart Surgery;  Laterality: N/A;  . ENDOVEIN HARVEST OF GREATER SAPHENOUS VEIN Right 02/05/2020   Procedure: ENDOVEIN HARVEST OF GREATER SAPHENOUS VEIN;  Surgeon: Grace Isaac, MD;  Location: [place];  Service: Open Heart Surgery;  Laterality: Right;  . LEFT HEART CATH AND CORONARY ANGIOGRAPHY N/A 01/25/2020   Procedure: LEFT HEART CATH AND CORONARY ANGIOGRAPHY;  Surgeon: Leonie Man, MD;  Location: [place] [facility];  Service: Cardiovascular;  Laterality: N/A;  . TEE WITHOUT CARDIOVERSION N/A 02/05/2020   Procedure: TRANSESOPHAGEAL ECHOCARDIOGRAM (TEE);  Surgeon: Grace Isaac, MD;  Location: [place];  Service: Open Heart Surgery;   Laterality: N/A;  . TONSILLECTOMY AND ADENOIDECTOMY    . WISDOM TOOTH EXTRACTION      Family History  Problem Relation Age of Onset  . Coronary artery disease Other   . Heart failure Mother   . Alzheimer's disease Mother    Social History   Socioeconomic History  . Marital status: Married    Spouse name: Not on file  . Number of children: Not on file  . Years of education: Not on file  . Highest education level: Not on file  Occupational History  . Not on file  Tobacco Use  . Smoking status: Former Smoker    Packs/day: 1.50    Years: 37.00    Pack years: 55.50    Types: Cigarettes    Quit date: 05/2000    Years since quitting: 20.2  . Smokeless tobacco: Never Used  Vaping Use  . Vaping Use: Never used  Substance and Sexual Activity  . Alcohol use: No  . Drug use: No  . Sexual activity: Not Currently  Other Topics Concern  . Not on file  Social History Narrative   He lives in [place] with family.  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         Social Determinants of Health   Financial Resource Strain: Not on file  Food Insecurity: Not on file  Transportation Needs: Not on file  Physical Activity: Sufficiently Active  . Days of Exercise per Week: 5 days  . Minutes of Exercise per Session: 30 min  Stress: Stress Concern Present  . Feeling of Stress : To some extent  Social Connections: Unknown  . Frequency of Communication with Friends and Family: Not on file  . Frequency of Social Gatherings with Friends and Family: Not on file  . Attends Religious Services: Not on file  . Active Member of Clubs or Organizations: Not on file  . Attends Archivist Meetings: Not on file  . Marital Status: Married    Review of Systems  Constitutional: Negative for chills, diaphoresis, fatigue and fever.  HENT: Negative for congestion, ear pain and sore throat.    Respiratory: Positive for cough. Negative for shortness of breath.   Cardiovascular: Negative for chest pain, palpitations and leg swelling.  Gastrointestinal: Negative for abdominal pain, constipation, diarrhea, nausea and vomiting.  Genitourinary: Negative for dysuria and urgency.  Musculoskeletal: Positive for myalgias (leg pain when walking ). Negative for arthralgias.  Neurological: Negative for dizziness and headaches.  Psychiatric/Behavioral: Negative for dysphoric mood.     Objective:  BP 124/78   Pulse 84   Temp (!) 96 F (35.6 C)   Resp 20  Ht 5\' 7"  (1.702 m)   Wt 210 lb (95.3 kg)   BMI 32.89 kg/m   BP/Weight 07/23/2020 05/26/2020 02/20/8337  Systolic BP 250 539 767  Diastolic BP 78 76 60  Wt. (Lbs) 210 204 208  BMI 32.89 31.95 32.58    Physical Exam Vitals reviewed.  Constitutional:      Appearance: Normal appearance. He is normal weight.  Neck:     Vascular: No carotid bruit.  Cardiovascular:     Rate and Rhythm: Normal rate and regular rhythm.     Pulses:          Dorsalis pedis pulses are 1+ on the right side and 1+ on the left side.     Heart sounds: Normal heart sounds.  Pulmonary:     Effort: Pulmonary effort is normal.     Breath sounds: Normal breath sounds.  Abdominal:     General: Abdomen is flat. Bowel sounds are normal.     Palpations: Abdomen is soft.  Neurological:     Mental Status: He is alert and oriented to person, place, and time.  Psychiatric:        Mood and Affect: Mood normal.        Behavior: Behavior normal.     Diabetic Foot Exam - Simple   No data filed      Lab Results  Component Value Date   WBC 5.5 05/26/2020   HGB 13.3 05/26/2020   HCT 39.3 05/26/2020   PLT 299 05/26/2020   GLUCOSE 86 05/26/2020   CHOL 124 03/28/2020   TRIG 163 (H) 03/28/2020   HDL 40 03/28/2020   LDLDIRECT 152.5 10/05/2010   LDLCALC 56 03/28/2020   ALT 14 05/26/2020   AST 14 05/26/2020   NA 136 05/26/2020   K 4.9 05/26/2020   CL 101  05/26/2020   CREATININE 0.98 05/26/2020   BUN 18 05/26/2020   CO2 25 05/26/2020   TSH 11.800 (H) 06/10/2020   INR 1.4 (H) 02/05/2020   HGBA1C 6.0 (H) 04/11/2020      Assessment & Plan:   1. Acquired hypothyroidism - TSH  2. Mixed hyperlipidemia Low fat diet and exercise.  Discussed crestor. - Lipid panel  3. Hypertensive heart disease without heart failure The current medical regimen is effective;  continue present plan and medications. Recommend continue to work on eating healthy diet and exercise. - CBC with Differential/Platelet - Comprehensive metabolic panel  4. Intermittent claudication (HCC) - VAS [place] ABI WITH/WO TBI  5. Class 1 obesity due to excess calories with serious comorbidity and body mass index (BMI) of 32.0 to 32.9 in adult  [place].   Orders Placed This Encounter  Procedures  . CBC with Differential/Platelet  . Comprehensive metabolic panel  . Lipid panel  . TSH  . VAS [place] ABI WITH/WO TBI     Follow-up: Return in about 3 months (around 10/20/2020) for fasting.  An After Visit Summary was printed and given to the patient.  Rochel Brome, MD [facility] [phone]

## 2020-07-23 ENCOUNTER — Encounter: Payer: Self-pay | Admitting: Family Medicine

## 2020-07-23 ENCOUNTER — Other Ambulatory Visit: Payer: Self-pay

## 2020-07-23 ENCOUNTER — Ambulatory Visit (INDEPENDENT_AMBULATORY_CARE_PROVIDER_SITE_OTHER): Payer: Medicare HMO | Admitting: Family Medicine

## 2020-07-23 VITALS — BP 124/78 | HR 84 | Temp 96.0°F | Resp 20 | Ht 67.0 in | Wt 210.0 lb

## 2020-07-23 DIAGNOSIS — I739 Peripheral vascular disease, unspecified: Secondary | ICD-10-CM | POA: Diagnosis not present

## 2020-07-23 DIAGNOSIS — Z6832 Body mass index (BMI) 32.0-32.9, adult: Secondary | ICD-10-CM

## 2020-07-23 DIAGNOSIS — E782 Mixed hyperlipidemia: Secondary | ICD-10-CM

## 2020-07-23 DIAGNOSIS — E039 Hypothyroidism, unspecified: Secondary | ICD-10-CM | POA: Diagnosis not present

## 2020-07-23 DIAGNOSIS — I10 Essential (primary) hypertension: Secondary | ICD-10-CM

## 2020-07-23 DIAGNOSIS — I119 Hypertensive heart disease without heart failure: Secondary | ICD-10-CM

## 2020-07-23 DIAGNOSIS — E6609 Other obesity due to excess calories: Secondary | ICD-10-CM

## 2020-07-23 DIAGNOSIS — F17201 Nicotine dependence, unspecified, in remission: Secondary | ICD-10-CM | POA: Insufficient documentation

## 2020-07-23 DIAGNOSIS — Z8669 Personal history of other diseases of the nervous system and sense organs: Secondary | ICD-10-CM | POA: Insufficient documentation

## 2020-07-23 DIAGNOSIS — R7301 Impaired fasting glucose: Secondary | ICD-10-CM | POA: Diagnosis not present

## 2020-07-23 NOTE — Patient Instructions (Signed)
Take coenzyme q10 while taking cholesterol medicine to help prevent muscle cramps. Ordering vascular study to check for peripheral artery disease.   [email or web address]">  DASH Eating Plan DASH stands for Dietary Approaches to Stop Hypertension. The DASH eating plan is a healthy eating plan that has been shown to:  Reduce high blood pressure (hypertension).  Reduce your risk for type 2 diabetes, heart disease, and stroke.  Help with weight loss. What are tips for following this plan? Reading food labels  Check food labels for the amount of salt (sodium) per serving. Choose foods with less than 5 percent of the Daily Value of sodium. Generally, foods with less than 300 milligrams (mg) of sodium per serving fit into this eating plan.  To find whole grains, look for the word "whole" as the first word in the ingredient list. Shopping  Buy products labeled as "low-sodium" or "no salt added."  Buy fresh foods. Avoid canned foods and pre-made or frozen meals. Cooking  Avoid adding salt when cooking. Use salt-free seasonings or herbs instead of table salt or sea salt. Check with your health care provider or pharmacist before using salt substitutes.  Do not fry foods. Cook foods using healthy methods such as baking, boiling, grilling, roasting, and broiling instead.  Cook with heart-healthy oils, such as olive, canola, avocado, soybean, or sunflower oil. Meal planning  Eat a balanced diet that includes: ? 4 or more servings of fruits and 4 or more servings of vegetables each day. Try to fill one-half of your plate with fruits and vegetables. ? 6-8 servings of whole grains each day. ? Less than 6 oz (170 g) of lean meat, poultry, or fish each day. A 3-oz (85-g) serving of meat is about the same size as a deck of cards. One egg equals 1 oz (28 g). ? 2-3 servings of low-fat dairy each day. One serving is 1 cup (237 mL). ? 1 serving of nuts, seeds,  or beans 5 times each week. ? 2-3 servings of heart-healthy fats. Healthy fats called omega-3 fatty acids are found in foods such as walnuts, flaxseeds, fortified milks, and eggs. These fats are also found in cold-water fish, such as sardines, salmon, and mackerel.  Limit how much you eat of: ? Canned or prepackaged foods. ? Food that is high in trans fat, such as some fried foods. ? Food that is high in saturated fat, such as fatty meat. ? Desserts and other sweets, sugary drinks, and other foods with added sugar. ? Full-fat dairy products.  Do not salt foods before eating.  Do not eat more than 4 egg yolks a week.  Try to eat at least 2 vegetarian meals a week.  Eat more home-cooked food and less restaurant, buffet, and fast food.   Lifestyle  When eating at a restaurant, ask that your food be prepared with less salt or no salt, if possible.  If you drink alcohol: ? Limit how much you use to:  0-1 drink a day for women who are not pregnant.  0-2 drinks a day for men. ? Be aware of how much alcohol is in your drink. In the [place] one drink equals one 12 oz bottle of beer (355 mL), one 5 oz glass of wine (148 mL), or one 1 oz glass of hard liquor (44 mL). General information  Avoid eating more than 2,300 mg of salt a day. If you have hypertension, you may need to reduce your sodium intake to 1,500 mg a day.  Work with your health care provider to maintain a healthy body weight or to lose weight. Ask what an ideal weight is for you.  Get at least 30 minutes of exercise that causes your heart to beat faster (aerobic exercise) most days of the week. Activities may include walking, swimming, or biking.  Work with your health care provider or dietitian to adjust your eating plan to your individual calorie needs. What foods should I eat? Fruits All fresh, dried, or frozen fruit. Canned fruit in natural juice (without added sugar). Vegetables Fresh or frozen vegetables (raw,  steamed, roasted, or grilled). Low-sodium or reduced-sodium tomato and vegetable juice. Low-sodium or reduced-sodium tomato sauce and tomato paste. Low-sodium or reduced-sodium canned vegetables. Grains Whole-grain or whole-wheat bread. Whole-grain or whole-wheat pasta. Brown rice. [place]. Bulgur. Whole-grain and low-sodium cereals. Pita bread. Low-fat, low-sodium crackers. Whole-wheat flour tortillas. Meats and other proteins Skinless chicken or [place]. Ground chicken or [place]. Pork with fat trimmed off. Fish and seafood. Egg whites. Dried beans, peas, or lentils. Unsalted nuts, nut butters, and seeds. Unsalted canned beans. Lean cuts of beef with fat trimmed off. Low-sodium, lean precooked or cured meat, such as sausages or meat loaves. Dairy Low-fat (1%) or fat-free (skim) milk. Reduced-fat, low-fat, or fat-free cheeses. Nonfat, low-sodium ricotta or cottage cheese. Low-fat or nonfat yogurt. Low-fat, low-sodium cheese. Fats and oils Soft margarine without trans fats. Vegetable oil. Reduced-fat, low-fat, or light mayonnaise and salad dressings (reduced-sodium). Canola, safflower, olive, avocado, soybean, and sunflower oils. Avocado. Seasonings and condiments Herbs. Spices. Seasoning mixes without salt. Other foods Unsalted popcorn and pretzels. Fat-free sweets. The items listed above may not be a complete list of foods and beverages you can eat. Contact a dietitian for more information. What foods should I avoid? Fruits Canned fruit in a light or heavy syrup. Fried fruit. Fruit in cream or butter sauce. Vegetables Creamed or fried vegetables. Vegetables in a cheese sauce. Regular canned vegetables (not low-sodium or reduced-sodium). Regular canned tomato sauce and paste (not low-sodium or reduced-sodium). Regular tomato and vegetable juice (not low-sodium or reduced-sodium). Angie Fava. Olives. Grains Baked goods made with fat, such as croissants, muffins, or some breads. Dry pasta or  rice meal packs. Meats and other proteins Fatty cuts of meat. Ribs. Fried meat. Berniece Salines. Bologna, salami, and other precooked or cured meats, such as sausages or meat loaves. Fat from the back of a pig (fatback). Bratwurst. Salted nuts and seeds. Canned beans with added salt. Canned or smoked fish. Whole eggs or egg yolks. Chicken or [place] with skin. Dairy Whole or 2% milk, cream, and half-and-half. Whole or full-fat cream cheese. Whole-fat or sweetened yogurt. Full-fat cheese. Nondairy creamers. Whipped toppings. Processed cheese and cheese spreads. Fats and oils Butter. Stick margarine. Lard. Shortening. Ghee. Bacon fat. Tropical oils, such as coconut, palm kernel, or palm oil. Seasonings and condiments Onion salt, garlic salt, seasoned salt, table salt, and sea salt. Worcestershire sauce. Tartar sauce. Barbecue sauce. Teriyaki sauce. Soy sauce, including reduced-sodium. Steak sauce. Canned and packaged gravies. Fish sauce. Oyster sauce. Cocktail sauce. Store-bought horseradish. Ketchup. Mustard. Meat flavorings and tenderizers. Bouillon cubes. Hot sauces. Pre-made or packaged marinades. Pre-made or packaged taco seasonings. Relishes. Regular salad dressings. Other foods Salted popcorn and pretzels. The items listed above may not be a complete list of foods and beverages you should avoid. Contact a dietitian for more information. Where to find more information  National Heart, Lung, and Blood Institute: https://wilson-eaton.com/  American Heart Association: www.heart.org  Academy of Nutrition and  Dietetics: www.eatright.[place]: www.kidney.org Summary  The DASH eating plan is a healthy eating plan that has been shown to reduce high blood pressure (hypertension). It may also reduce your risk for type 2 diabetes, heart disease, and stroke.  When on the DASH eating plan, aim to eat more fresh fruits and vegetables, whole grains, lean proteins, low-fat dairy, and heart-healthy  fats.  With the DASH eating plan, you should limit salt (sodium) intake to 2,300 mg a day. If you have hypertension, you may need to reduce your sodium intake to 1,500 mg a day.  Work with your health care provider or dietitian to adjust your eating plan to your individual calorie needs. This information is not intended to replace advice given to you by your health care provider. Make sure you discuss any questions you have with your health care provider. Document Revised: 05/04/2019 Document Reviewed: 05/04/2019 Elsevier Patient Education  2021 [place].   Peripheral Vascular Disease  Peripheral vascular disease (PVD) is a disease of the blood vessels. PVD may also be called peripheral artery disease (PAD) or poor circulation. PVD is the blocking or hardening of the arteries anywhere within the circulatory system beyond the heart. This can result in a decreased supply of blood to the arms, legs, and internal organs, such as the stomach or kidneys. However, PVD most often affects a person's lower legs and feet. Without treatment, PVD often worsens. PVD can lead to acute limb ischemia. This occurs when an arm or leg suddenly has trouble getting enough blood. This is a medical emergency. What are the causes? The most common cause of PVD is atherosclerosis. This is a buildup of fatty material and other substances (plaque)inside your arteries. Pieces of plaque can break off from the walls of an artery and become stuck in a smaller artery, blocking blood flow and possibly causing acute limb ischemia. Other common causes of PVD include:  Blood clots that form inside the blood vessels.  Injuries to blood vessels.  Diseases that cause inflammation of blood vessels or cause blood vessel tightening (spasms). What increases the risk? The following factors may make you more likely to develop this condition:  A family history of PVD.  Common medical conditions, including: ? High  cholesterol. ? Diabetes. ? High blood pressure (hypertension). ? Heart disease. ? Known atherosclerotic disease in another area of the body. ? Past injury, such as burns or a broken bone.  Other medical conditions, such as: ? Buerger's disease. This is caused by inflamed blood vessels in your hands and feet. ? Some forms of arthritis. ? Birth defects that affect the arteries in your legs. ? Kidney disease.  Using tobacco and nicotine products.  Not getting enough exercise.  Obesity.  Being age 17 or older, or being age 77 or older and having the other risk factors. What are the signs or symptoms? This condition may cause different symptoms. Your symptoms depend on what body part is not getting enough blood. Common signs and symptoms include:  Cramps in your buttocks, legs, and feet.  Intermittent claudication. This is pain and weakness in your legs during activity that resolves with rest.  Leg pain at rest and leg numbness, tingling, or weakness.  Coldness in a leg or foot, especially when compared to the other leg or foot.  Skin or hair changes. These can include: ? Hair loss. ? Shiny skin. ? Pale or bluish skin. ? Thick toenails.  Inability to get or maintain an erection (  erectile dysfunction).  Tiredness (fatigue).  Weak pulse or no pulse in the feet. People with PVD are more likely to develop open wounds (ulcers) and sores on their toes, feet, or legs. The ulcers or sores may take longer than normal to heal. How is this diagnosed? PVD is diagnosed based on your signs and symptoms, a physical exam, and your medical history. You may also have other tests to find the cause. Tests include:  Ankle-brachial index test.This test compares the blood pressure readings of the legs and arms. ? This may also include an exercise ankle-brachial index test in which you walk on a treadmill to check your symptoms.  Doppler ultrasound. This takes pictures of blood flow through your  blood vessels.  Imaging studies that use dye to show blood flow. These are: ? CT angiogram. ? Magnetic resonance angiogram, or MRA. How is this treated? Treatment for PVD depends on the cause of your condition, how severe your symptoms are, and your age. Underlying causes need to be treated and controlled. These include long-term (chronic) conditions, such as diabetes, high cholesterol, and hypertension. Treatment may include:  Lifestyle changes, such as: ? Quitting tobacco use. ? Exercising regularly. ? Following a low-fat, low-cholesterol diet. ? Not drinking alcohol.  Taking medicines, such as: ? Blood thinners to prevent blood clots. ? Medicines to improve blood flow. ? Medicines to improve cholesterol levels.  Procedures, such as: ? Angioplasty. This uses an inflated balloon to open a blocked artery and improve blood flow. ? Stent implant. This inserts a small mesh tube to keep a blocked artery open. ? Peripheral bypass surgery. This reroutes blood flow around a blocked artery. ? Surgery to remove dead tissue from an infected wound (debridement). ? Amputation. This is surgical removal of the affected limb. It may be necessary in cases of acute limb ischemia when medical or surgical treatments have not helped. Follow these instructions at home: Medicines  Take over-the-counter and prescription medicines only as told by your health care provider.  If you are taking blood thinners: ? Talk with your health care provider before you take any medicines that contain aspirin or NSAIDs, such as ibuprofen. These medicines increase your risk for dangerous bleeding. ? Take your medicine exactly as told, at the same time every day. ? Avoid activities that could cause injury or bruising, and follow instructions about how to prevent falls. ? Wear a medical alert bracelet or carry a card that lists what medicines you take. Lifestyle  Exercise regularly. Ask your health care provider about  some good activities for you.  Talk with your health care provider about maintaining a healthy weight. If needed, ask about losing weight.  Eat a diet that is low in fat and cholesterol. If you need help, talk with your health care provider.  Do not drink alcohol.  Do not use any products that contain nicotine or tobacco. These products include cigarettes, chewing tobacco, and vaping devices, such as e-cigarettes. If you need help quitting, ask your health care provider.      General instructions  Take good care of your feet. To do this: ? Wear comfortable shoes that fit well. ? Check your feet often for any cuts or sores.  Get an annual influenza vaccine.  Keep all follow-up visits. This is important. Where to find more information  Society for Vascular Surgery: vascular.org  American Heart Association: heart.org  National Heart, Lung, and Blood Institute: https://www.hartman-hill.biz/ Contact a health care provider if:  You have leg  cramps while walking.  You have leg pain when you rest.  Your leg or foot feels cold.  Your skin changes color.  You have erectile dysfunction.  You have cuts or sores on your legs or feet that do not heal. Get help right away if:  You have sudden changes in color and feeling of your arms or legs, such as: ? Your arm or leg turns cold, numb, and blue. ? Your arm or leg becomes red, warm, swollen, painful, or numb.  You have any symptoms of a stroke. "BE FAST" is an easy way to remember the main warning signs of a stroke: ? B - Balance. Signs are dizziness, sudden trouble walking, or loss of balance. ? E - Eyes. Signs are trouble seeing or a sudden change in vision. ? F - Face. Signs are sudden weakness or numbness of the face, or the face or eyelid drooping on one side. ? A - Arms. Signs are weakness or numbness in an arm. This happens suddenly and usually on one side of the body. ? S - Speech. Signs are sudden trouble speaking, slurred speech, or  trouble understanding what people say. ? T - Time. Time to call emergency services. Write down what time symptoms started.  You have other signs of a stroke, such as: ? A sudden, severe headache with no known cause. ? Nausea or vomiting. ? Seizure.  You have chest pain or trouble breathing. These symptoms may represent a serious problem that is an emergency. Do not wait to see if the symptoms will go away. Get medical help right away. Call your local emergency services (911 in the [place] Do not drive yourself to the hospital. Summary  Peripheral vascular disease (PVD) is a disease of the blood vessels.  PVD is the blocking or hardening of the arteries anywhere within the circulatory system beyond the heart.  PVD may cause different symptoms. Your symptoms depend on what part of your body is not getting enough blood.  Treatment for PVD depends on what caused it, how severe your symptoms are, and your age. This information is not intended to replace advice given to you by your health care provider. Make sure you discuss any questions you have with your health care provider. Document Revised: 12/03/2019 Document Reviewed: 12/03/2019 Elsevier Patient Education  [place].

## 2020-07-24 LAB — CBC WITH DIFFERENTIAL/PLATELET
Basophils Absolute: 0 10*3/uL (ref 0.0–0.2)
Basos: 1 %
EOS (ABSOLUTE): 0.1 10*3/uL (ref 0.0–0.4)
Eos: 3 %
Hematocrit: 41 % (ref 37.5–51.0)
Hemoglobin: 14.2 g/dL (ref 13.0–17.7)
Immature Grans (Abs): 0.1 10*3/uL (ref 0.0–0.1)
Immature Granulocytes: 1 %
Lymphocytes Absolute: 1.2 10*3/uL (ref 0.7–3.1)
Lymphs: 25 %
MCH: 30.1 pg (ref 26.6–33.0)
MCHC: 34.6 g/dL (ref 31.5–35.7)
MCV: 87 fL (ref 79–97)
Monocytes Absolute: 0.5 10*3/uL (ref 0.1–0.9)
Monocytes: 11 %
Neutrophils Absolute: 2.9 10*3/uL (ref 1.4–7.0)
Neutrophils: 59 %
Platelets: 288 10*3/uL (ref 150–450)
RBC: 4.71 x10E6/uL (ref 4.14–5.80)
RDW: 13.6 % (ref 11.6–15.4)
WBC: 4.8 10*3/uL (ref 3.4–10.8)

## 2020-07-24 LAB — COMPREHENSIVE METABOLIC PANEL
ALT: 14 IU/L (ref 0–44)
AST: 13 IU/L (ref 0–40)
Albumin/Globulin Ratio: 1.8 (ref 1.2–2.2)
Albumin: 4.4 g/dL (ref 3.7–4.7)
Alkaline Phosphatase: 50 IU/L (ref 44–121)
BUN/Creatinine Ratio: 16 (ref 10–24)
BUN: 17 mg/dL (ref 8–27)
Bilirubin Total: 0.4 mg/dL (ref 0.0–1.2)
CO2: 20 mmol/L (ref 20–29)
Calcium: 9 mg/dL (ref 8.6–10.2)
Chloride: 99 mmol/L (ref 96–106)
Creatinine, Ser: 1.07 mg/dL (ref 0.76–1.27)
GFR calc Af Amer: 76 mL/min/{1.73_m2} (ref >59)
GFR calc non Af Amer: 66 mL/min/{1.73_m2} (ref >59)
Globulin, Total: 2.4 g/dL (ref 1.5–4.5)
Glucose: 103 mg/dL — ABNORMAL HIGH (ref 65–99)
Potassium: 4.7 mmol/L (ref 3.5–5.2)
Sodium: 137 mmol/L (ref 134–144)
Total Protein: 6.8 g/dL (ref 6.0–8.5)

## 2020-07-24 LAB — CARDIOVASCULAR RISK ASSESSMENT

## 2020-07-24 LAB — LIPID PANEL
Chol/HDL Ratio: 6.4 ratio — ABNORMAL HIGH (ref 0.0–5.0)
Cholesterol, Total: 225 mg/dL — ABNORMAL HIGH (ref 100–199)
HDL: 35 mg/dL — ABNORMAL LOW (ref >39)
LDL Chol Calc (NIH): 167 mg/dL — ABNORMAL HIGH (ref 0–99)
Triglycerides: 128 mg/dL (ref 0–149)
VLDL Cholesterol Cal: 23 mg/dL (ref 5–40)

## 2020-07-24 LAB — TSH: TSH: 12.3 u[IU]/mL — ABNORMAL HIGH (ref 0.450–4.500)

## 2020-07-28 ENCOUNTER — Other Ambulatory Visit: Payer: Self-pay

## 2020-07-28 MED ORDER — ROSUVASTATIN CALCIUM 10 MG PO TABS: 10.0000 mg | ORAL_TABLET | Freq: Every evening | ORAL | 2 refills | Status: DC

## 2020-07-28 MED ORDER — LEVOTHYROXINE SODIUM 50 MCG PO TABS
50.0000 ug | ORAL_TABLET | Freq: Every day | ORAL | 2 refills | Status: DC
Start: 2020-07-28 — End: 2021-03-06

## 2020-07-29 DIAGNOSIS — I739 Peripheral vascular disease, unspecified: Secondary | ICD-10-CM | POA: Diagnosis not present

## 2020-07-29 DIAGNOSIS — I70203 Unspecified atherosclerosis of native arteries of extremities, bilateral legs: Secondary | ICD-10-CM | POA: Diagnosis not present

## 2020-07-30 LAB — SPECIMEN STATUS REPORT

## 2020-07-30 LAB — HGB A1C W/O EAG: Hgb A1c MFr Bld: 6.2 % — ABNORMAL HIGH (ref 4.8–5.6)

## 2020-07-31 ENCOUNTER — Other Ambulatory Visit: Payer: Self-pay

## 2020-07-31 DIAGNOSIS — R739 Hyperglycemia, unspecified: Secondary | ICD-10-CM

## 2020-07-31 DIAGNOSIS — I739 Peripheral vascular disease, unspecified: Secondary | ICD-10-CM

## 2020-07-31 DIAGNOSIS — R6889 Other general symptoms and signs: Secondary | ICD-10-CM

## 2020-07-31 MED ORDER — DAPAGLIFLOZIN PROPANEDIOL 5 MG PO TABS: 5.0000 mg | ORAL_TABLET | Freq: Every day | ORAL | 0 refills | Status: DC

## 2020-08-23 NOTE — Progress Notes (Signed)
Cardiology Office Note:    Date:  08/25/2020   ID:  Jeffery Turner, DOB 03/03/1941, MRN [id number]  PCP:  Rochel Brome, MD  Cardiologist:  Shirlee More, MD    Referring MD: Rochel Brome, MD    ASSESSMENT:    1. Coronary artery disease involving native heart without angina pectoris, unspecified vessel or lesion type   2. Mixed hyperlipidemia   3. Paroxysmal atrial fibrillation (HCC)   4. Acquired hypothyroidism    PLAN:    In order of problems listed above:  1. His biggest problem here is noncompliance I asked his wife to get involved supervising medications he needs to take aspirin daily needs lipid-lowering.  I strongly encouraged him to get back to regular physical activity 2. Poorly controlled discontinue statin with muscle pain and weakness transition the PCSK9 inhibitor 3. Stable no clinical recurrence continue low-dose beta-blocker 4. On thyroid supplementation   Next appointment: 6 months   Medication Adjustments/Labs and Tests Ordered: Current medicines are reviewed at length with the patient today.  Concerns regarding medicines are outlined above.  No orders of the defined types were placed in this encounter.  No orders of the defined types were placed in this encounter.   Chief complaint: Follow-up for CAD  History of Present Illness:    Jeffery Turner is a 80 y.o. male with a hx of CAD with PCI of all native vessels left circumflex 1997 LAD and right coronary artery January 2012, hyperlipidemia subsequent CABG [facility] 02/05/2020 with post operative atrial fibrillation last seen 03/28/2020.  At that visit amiodarone was withdrawn and subsequently has developed hypothyroidism requiring Synthroid replacement.  Compliance with diet, lifestyle and medications: No  He is not taking aspirin he has forgotten to go and purchase at the store He is taking a statin sporadically because of muscle pain and weakness in his lower extremities.  He is having trouble  ambulating and is due to see vascular surgery predominant complaint is leg weakness.  I suspect he is statin myopathy discontinue and transition to PCSK9 inhibitor. He is very sedentary and I encouraged him to get back to physical activity He has occasional sternal pain with upper extremity movement. He is having no angina shortness of breath palpitation or syncope.  Component Ref Range & Units 1 mo ago  (07/23/20) 2 mo ago  (06/10/20) 3 mo ago  (05/05/20) 6 mo ago  (02/15/20) 6 mo ago  (02/08/20) 10 yr ago  (07/11/10)  TSH 0.450 - 4.500 uIU/mL 12.300High  11.800High  13.200High  7.610High  5.401High R, CM  2.514    He had recent abnormal ABIs performed at [facility] and was referred to vascular surgery. He was recently restarted on a high intensity statin with an LDL of 167 previously 56 from 5 months prior. Past Medical History:  Diagnosis Date  . Anemia   . Angina pectoris ([place]) 02/06/2018  . Angina, class III ([place]) 02/06/2018  . Anginal equivalent ([place]) 09/19/2019  . CAD (coronary artery disease)    s/p stent to CFX 1997 2/2 MI s/p DES to [facility] and DES mLAD 07/14/10 2/2 [place] cath 07/14/10: dLAD 60%; CFX stent ok; OM1 30-40%; pRCA 30%; PDA 40%; EF 60%  . Constant exophthalmos    right  . Coronary artery disease of native artery of native heart with stable angina pectoris ([place]) 07/21/2010   Qualifier: Diagnosis of  By: Denny Peon, CMA, [name]    . Epistaxis 07/21/2010   Qualifier: Diagnosis of  By:  Jorene Minors, Event organiser    . Hearing loss    right eye - no hearing aids  . Hyperlipidemia   . MI (myocardial infarction) ([place])   . Mixed hyperlipidemia 07/21/2010   Qualifier: Diagnosis of  By: Orville Govern CMA, Arbie Cookey    . Old myocardial infarction 07/21/2010   Qualifier: Diagnosis of  By: Hester Mates, Carol    . Osteoarthritis   . OSTEOARTHRITIS 07/21/2010   Qualifier: Diagnosis of  By: Ronne Binning    . 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 Shortness of breath 07/21/2010   Qualifier: Diagnosis of  By:  Jorene Minors, Scott    . TOBACCO USE, QUIT 07/21/2010   Qualifier: Diagnosis of  By: Orville Govern CMA, Arbie Cookey    . Transient visual loss, right    wears glasses  . UNSTABLE ANGINA 07/21/2010   Qualifier: Diagnosis of  By: Ronne Binning      Past Surgical History:  Procedure Laterality Date  . CARDIAC CATHETERIZATION    . CORONARY ARTERY BYPASS GRAFT N/A 02/05/2020   Procedure: CORONARY ARTERY BYPASS GRAFTING (CABG), ON PUMP, TIMES THREE, USING LEFT INTERNAL MAMMARY ARTERY TO LEFT ANTERIOR DESCENDING ARTERY, REVERSE SAPHENOUS VEIN GRAFT TO POSTERIOR DESCENDING ARTERY AND CIRCUMFLEX ARTERY;  Surgeon: Grace Isaac, MD;  Location: [place];  Service: Open Heart Surgery;  Laterality: N/A;  . ENDOVEIN HARVEST OF GREATER SAPHENOUS VEIN Right 02/05/2020   Procedure: ENDOVEIN HARVEST OF GREATER SAPHENOUS VEIN;  Surgeon: Grace Isaac, MD;  Location: [place];  Service: Open Heart Surgery;  Laterality: Right;  . LEFT HEART CATH AND CORONARY ANGIOGRAPHY N/A 01/25/2020   Procedure: LEFT HEART CATH AND CORONARY ANGIOGRAPHY;  Surgeon: Leonie Man, MD;  Location: [place] CV LAB;  Service: Cardiovascular;  Laterality: N/A;  . TEE WITHOUT CARDIOVERSION N/A 02/05/2020   Procedure: TRANSESOPHAGEAL ECHOCARDIOGRAM (TEE);  Surgeon: Grace Isaac, MD;  Location: [place];  Service: Open Heart Surgery;  Laterality: N/A;  . TONSILLECTOMY AND ADENOIDECTOMY    . WISDOM TOOTH EXTRACTION      Current Medications: Current Meds  Medication Sig  . Calcium-Magnesium-Zinc (CAL-MAG-ZINC PO) Take 1 tablet by mouth daily.  . dapagliflozin propanediol (FARXIGA) 5 MG TABS tablet Take 1 tablet (5 mg total) by mouth daily.  Marland Kitchen levothyroxine (SYNTHROID) 50 MCG tablet Take 1 tablet (50 mcg total) by mouth daily.  . metoprolol tartrate (LOPRESSOR) 25 MG tablet Take 1 tablet (25 mg total) by mouth 2 (two) times daily.  . Multiple Vitamin (MULTIVITAMIN WITH MINERALS) TABS tablet Take 1 tablet by mouth daily.  . nitroGLYCERIN  (NITROSTAT) 0.4 MG SL tablet Place 1 tablet (0.4 mg total) under the tongue every 5 (five) minutes as needed. If require 2, recommend call 911.  . [DISCONTINUED] rosuvastatin (CRESTOR) 10 MG tablet Take 1 tablet (10 mg total) by mouth at bedtime.     Allergies:   Bactrim [sulfamethoxazole-trimethoprim], Codeine, and Statins   Social History   Socioeconomic History  . Marital status: Married    Spouse name: Not on file  . Number of children: Not on file  . Years of education: Not on file  . Highest education level: Not on file  Occupational History  . Not on file  Tobacco Use  . Smoking status: Former Smoker    Packs/day: 1.50    Years: 37.00    Pack years: 55.50    Types: Cigarettes    Quit date: 05/2000    Years since quitting: 20.2  . Smokeless tobacco:  Never Used  Vaping Use  . Vaping Use: Never used  Substance and Sexual Activity  . Alcohol use: No  . Drug use: No  . Sexual activity: Not Currently  Other Topics Concern  . Not on file  Social History Narrative   He lives in [place] with family.  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         Social Determinants of Health   Financial Resource Strain: Not on file  Food Insecurity: Not on file  Transportation Needs: Not on file  Physical Activity: Sufficiently Active  . Days of Exercise per Week: 5 days  . Minutes of Exercise per Session: 30 min  Stress: Stress Concern Present  . Feeling of Stress : To some extent  Social Connections: Unknown  . Frequency of Communication with Friends and Family: Not on file  . Frequency of Social Gatherings with Friends and Family: Not on file  . Attends Religious Services: Not on file  . Active Member of Clubs or Organizations: Not on file  . Attends Archivist Meetings: Not on file  . Marital Status: Married     Family History: The patient's family history  includes Alzheimer's disease in his mother; Coronary artery disease in an other family member; Heart failure in his mother. ROS:   Please see the history of present illness.    All other systems reviewed and are negative.  EKGs/Labs/Other Studies Reviewed:    The following studies were reviewed today: Recent Labs: 02/08/2020: Magnesium 1.8 03/28/2020: NT-Pro BNP 163 07/23/2020: ALT 14; BUN 17; Creatinine, Ser 1.07; Hemoglobin 14.2; Platelets 288; Potassium 4.7; Sodium 137; TSH 12.300  Recent Lipid Panel    Component Value Date/Time   CHOL 225 (H) 07/23/2020 0812   TRIG 128 07/23/2020 0812   HDL 35 (L) 07/23/2020 0812   CHOLHDL 6.4 (H) 07/23/2020 0812   CHOLHDL 4 04/08/2011 1202   VLDL 17.8 04/08/2011 1202   LDLCALC 167 (H) 07/23/2020 0812   LDLDIRECT 152.5 10/05/2010 0832    Physical Exam:    VS:  BP 98/62   Pulse 62   Ht 5\' 7"  (1.702 m)   Wt 215 lb (97.5 kg)   SpO2 98%   BMI 33.67 kg/m     Wt Readings from Last 3 Encounters:  08/25/20 215 lb (97.5 kg)  07/23/20 210 lb (95.3 kg)  05/26/20 204 lb (92.5 kg)     GEN:  Well nourished, well developed in no acute distress HEENT: Normal NECK: No JVD; No carotid bruits LYMPHATICS: No lymphadenopathy CARDIAC: RRR, no murmurs, rubs, gallops RESPIRATORY:  Clear to auscultation without rales, wheezing or rhonchi  ABDOMEN: Soft, non-tender, non-distended MUSCULOSKELETAL:  No edema; No deformity  SKIN: Warm and dry NEUROLOGIC:  Alert and oriented x 3 PSYCHIATRIC:  Normal affect    Signed, Shirlee More, MD  08/25/2020 9:51 AM    [place]

## 2020-08-25 ENCOUNTER — Encounter: Payer: Self-pay | Admitting: Cardiology

## 2020-08-25 ENCOUNTER — Telehealth: Payer: Self-pay

## 2020-08-25 ENCOUNTER — Other Ambulatory Visit: Payer: Self-pay

## 2020-08-25 ENCOUNTER — Ambulatory Visit: Payer: Medicare HMO | Admitting: Cardiology

## 2020-08-25 VITALS — BP 102/58 | HR 62 | Ht 67.0 in | Wt 215.0 lb

## 2020-08-25 DIAGNOSIS — E782 Mixed hyperlipidemia: Secondary | ICD-10-CM

## 2020-08-25 DIAGNOSIS — I480 Paroxysmal atrial fibrillation: Secondary | ICD-10-CM | POA: Diagnosis not present

## 2020-08-25 DIAGNOSIS — I2510 Atherosclerotic heart disease of native coronary artery without angina pectoris: Secondary | ICD-10-CM | POA: Diagnosis not present

## 2020-08-25 DIAGNOSIS — E039 Hypothyroidism, unspecified: Secondary | ICD-10-CM | POA: Diagnosis not present

## 2020-08-25 MED ORDER — ASPIRIN EC 81 MG PO TBEC: 81.0000 mg | DELAYED_RELEASE_TABLET | Freq: Every day | ORAL | 3 refills | Status: AC

## 2020-08-25 MED ORDER — REPATHA SURECLICK 140 MG/ML ~~LOC~~ SOAJ: 1.0000 mL | SUBCUTANEOUS | 6 refills | Status: DC

## 2020-08-25 NOTE — Patient Instructions (Signed)
Medication Instructions:  Your physician has recommended you make the following change in your medication:  STOP: Rosuvastatin  START: Repatha 140 mg per 1 ml injection. Inject once into the skin every two weeks.  Please resume taking your aspirin 81 mg daily.  *If you need a refill on your cardiac medications before your next appointment, please call your pharmacy*   Lab Work: None If you have labs (blood work) drawn today and your tests are completely normal, you will receive your results only by: Marland Kitchen MyChart Message (if you have MyChart) OR . A paper copy in the mail If you have any lab test that is abnormal or we need to change your treatment, we will call you to review the results.   Testing/Procedures: None   Follow-Up: At [facility], you and your health needs are our priority.  As part of our continuing mission to provide you with exceptional heart care, we have created designated Provider Care Teams.  These Care Teams include your primary Cardiologist (physician) and Advanced Practice Providers (APPs -  Physician Assistants and Nurse Practitioners) who all work together to provide you with the care you need, when you need it.  We recommend signing up for the patient portal called "MyChart".  Sign up information is provided on this After Visit Summary.  MyChart is used to connect with patients for Virtual Visits (Telemedicine).  Patients are able to view lab/test results, encounter notes, upcoming appointments, etc.  Non-urgent messages can be sent to your provider as well.   To learn more about what you can do with MyChart, go to [email or web address].    Your next appointment:   6 month(s)  The format for your next appointment:   In Person  Provider:   Shirlee More, MD   Other Instructions

## 2020-08-25 NOTE — Telephone Encounter (Signed)
Per cover my meds: Message from Plan PA Case: [id number]), Status: Approved, Coverage Starts on: 08/25/2020 12:00:00 AM, Coverage Ends on: 02/21/2021 12:00:00 AM. Questions? Contact [phone].

## 2020-08-25 NOTE — Telephone Encounter (Signed)
Prior Auth for Repatha initiated. Confirmation: [id number]

## 2020-09-04 ENCOUNTER — Encounter: Payer: Self-pay | Admitting: Vascular Surgery

## 2020-09-04 ENCOUNTER — Ambulatory Visit: Payer: Medicare HMO | Admitting: Vascular Surgery

## 2020-09-04 ENCOUNTER — Other Ambulatory Visit: Payer: Self-pay

## 2020-09-04 VITALS — BP 125/73 | HR 84 | Temp 98.0°F | Resp 20 | Ht 67.0 in | Wt 216.0 lb

## 2020-09-04 DIAGNOSIS — I739 Peripheral vascular disease, unspecified: Secondary | ICD-10-CM | POA: Diagnosis not present

## 2020-09-04 NOTE — Progress Notes (Signed)
Referring Physician: Dr. Dirk Dress  Patient name: Jeffery Turner MRN: [id number] DOB: 06-Dec-1940 Sex: male  REASON FOR CONSULT: Bilateral lower extremity claudication  HPI: Jeffery Turner is a 80 y.o. male, who has a several year history of tightness sensation in both calves after walking.  This has been slowly progressive since his coronary artery bypass in 1997.  He states he can walk about 1 block before he begins to get a cramping sensation in both calves.  This subsides after a few minutes of rest.  Of note he is also short of breath.  He states he has had shortness of breath for quite some time.  He was noticeably short of breath during the office visit today with some increased work of breathing.  He states this is baseline for him.  He states it is never really been evaluated in the past.  He does not describe rest pain in his feet.  He has no nonhealing wounds.  He quit smoking several years ago.  He is on a a cholesterol medication as well as aspirin.  He did have his right greater saphenous vein harvested for coronary artery bypass grafting in the past.  Other medical problems include diabetes and COPD.  The diabetes has been stable as noted above he does have chronic shortness of breath.  Past Medical History:  Diagnosis Date  . Anemia   . Angina pectoris ([place]) 02/06/2018  . Angina, class III ([place]) 02/06/2018  . Anginal equivalent ([place]) 09/19/2019  . CAD (coronary artery disease)    s/p stent to CFX 1997 2/2 MI s/p DES to [facility] and DES mLAD 07/14/10 2/2 [place] cath 07/14/10: dLAD 60%; CFX stent ok; OM1 30-40%; pRCA 30%; PDA 40%; EF 60%  . Constant exophthalmos    right  . Coronary artery disease of native artery of native heart with stable angina pectoris ([place]) 07/21/2010   Qualifier: Diagnosis of  By: Denny Peon, CMA, [name]    . Epistaxis 07/21/2010   Qualifier: Diagnosis of  By: Jorene Minors, Scott    . Hearing loss    right eye - no hearing aids  . Hyperlipidemia   . MI (myocardial  infarction) ([place])   . Mixed hyperlipidemia 07/21/2010   Qualifier: Diagnosis of  By: Orville Govern CMA, Arbie Cookey    . Old myocardial infarction 07/21/2010   Qualifier: Diagnosis of  By: Hester Mates, Carol    . Osteoarthritis   . OSTEOARTHRITIS 07/21/2010   Qualifier: Diagnosis of  By: Ronne Binning    . 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 Shortness of breath 07/21/2010   Qualifier: Diagnosis of  By: Jorene Minors, Scott    . Thyroid disease   . TOBACCO USE, QUIT 07/21/2010   Qualifier: Diagnosis of  By: Orville Govern CMA, Arbie Cookey    . Transient visual loss, right    wears glasses  . UNSTABLE ANGINA 07/21/2010   Qualifier: Diagnosis of  By: Ronne Binning     Past Surgical History:  Procedure Laterality Date  . CARDIAC CATHETERIZATION    . CORONARY ARTERY BYPASS GRAFT N/A 02/05/2020   Procedure: CORONARY ARTERY BYPASS GRAFTING (CABG), ON PUMP, TIMES THREE, USING LEFT INTERNAL MAMMARY ARTERY TO LEFT ANTERIOR DESCENDING ARTERY, REVERSE SAPHENOUS VEIN GRAFT TO POSTERIOR DESCENDING ARTERY AND CIRCUMFLEX ARTERY;  Surgeon: Grace Isaac, MD;  Location: [place];  Service: Open Heart Surgery;  Laterality: N/A;  . ENDOVEIN HARVEST OF GREATER SAPHENOUS VEIN Right 02/05/2020   Procedure: ENDOVEIN HARVEST OF  GREATER SAPHENOUS VEIN;  Surgeon: Grace Isaac, MD;  Location: [place];  Service: Open Heart Surgery;  Laterality: Right;  . LEFT HEART CATH AND CORONARY ANGIOGRAPHY N/A 01/25/2020   Procedure: LEFT HEART CATH AND CORONARY ANGIOGRAPHY;  Surgeon: Leonie Man, MD;  Location: [place] CV LAB;  Service: Cardiovascular;  Laterality: N/A;  . TEE WITHOUT CARDIOVERSION N/A 02/05/2020   Procedure: TRANSESOPHAGEAL ECHOCARDIOGRAM (TEE);  Surgeon: Grace Isaac, MD;  Location: [place];  Service: Open Heart Surgery;  Laterality: N/A;  . TONSILLECTOMY AND ADENOIDECTOMY    . WISDOM TOOTH EXTRACTION      Family History  Problem Relation Age of Onset  . Coronary artery disease Other   . Heart failure Mother   . Alzheimer's  disease Mother     SOCIAL HISTORY: Social History   Socioeconomic History  . Marital status: Married    Spouse name: Not on file  . Number of children: Not on file  . Years of education: Not on file  . Highest education level: Not on file  Occupational History  . Not on file  Tobacco Use  . Smoking status: Former Smoker    Packs/day: 1.50    Years: 37.00    Pack years: 55.50    Types: Cigarettes    Quit date: 05/2000    Years since quitting: 20.3  . Smokeless tobacco: Never Used  Vaping Use  . Vaping Use: Never used  Substance and Sexual Activity  . Alcohol use: No  . Drug use: No  . Sexual activity: Not Currently  Other Topics Concern  . Not on file  Social History Narrative   He lives in [place] with family.  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         Social Determinants of Health   Financial Resource Strain: Not on file  Food Insecurity: Not on file  Transportation Needs: Not on file  Physical Activity: Sufficiently Active  . Days of Exercise per Week: 5 days  . Minutes of Exercise per Session: 30 min  Stress: Stress Concern Present  . Feeling of Stress : To some extent  Social Connections: Unknown  . Frequency of Communication with Friends and Family: Not on file  . Frequency of Social Gatherings with Friends and Family: Not on file  . Attends Religious Services: Not on file  . Active Member of Clubs or Organizations: Not on file  . Attends Archivist Meetings: Not on file  . Marital Status: Married  Human resources officer Violence: Not on file    Allergies  Allergen Reactions  . Bactrim [Sulfamethoxazole-Trimethoprim] Shortness Of Breath    Breathing problems.  Rt ear - hearing loss.   . Codeine Nausea And Vomiting  . Statins Other (See Comments)    Patient preference to not take statins/cholesterol meds    Current Outpatient  Medications  Medication Sig Dispense Refill  . aspirin EC 81 MG tablet Take 1 tablet (81 mg total) by mouth daily. Swallow whole. 90 tablet 3  . Calcium-Magnesium-Zinc (CAL-MAG-ZINC PO) Take 1 tablet by mouth daily.    . COCONUT OIL PO Take 30 mLs by mouth daily.    . dapagliflozin propanediol (FARXIGA) 5 MG TABS tablet Take 1 tablet (5 mg total) by mouth daily. 90 tablet 0  . Evolocumab (REPATHA SURECLICK) 124 MG/ML SOAJ Inject 1 mL  into the skin every 14 (fourteen) days. 2 mL 6  . levothyroxine (SYNTHROID) 50 MCG tablet Take 1 tablet (50 mcg total) by mouth daily. 30 tablet 2  . metoprolol tartrate (LOPRESSOR) 25 MG tablet Take 1 tablet (25 mg total) by mouth 2 (two) times daily. 180 tablet 2  . mometasone-formoterol (DULERA) 100-5 MCG/ACT AERO Inhale 2 puffs into the lungs 2 (two) times daily. 1 each 1  . Multiple Vitamin (MULTIVITAMIN WITH MINERALS) TABS tablet Take 1 tablet by mouth daily.    . nitroGLYCERIN (NITROSTAT) 0.4 MG SL tablet Place 1 tablet (0.4 mg total) under the tongue every 5 (five) minutes as needed. If require 2, recommend call 911. 50 tablet 0   No current facility-administered medications for this visit.    ROS:   General:  No weight loss, Fever, chills  HEENT: No recent headaches, no nasal bleeding, no visual changes, no sore throat  Neurologic: No dizziness, blackouts, seizures. No recent symptoms of stroke or mini- stroke. No recent episodes of slurred speech, or temporary blindness.  Cardiac: No recent episodes of chest pain/pressure, + shortness of breath at rest.  + shortness of breath with exertion.  Denies history of atrial fibrillation or irregular heartbeat  Vascular: No history of rest pain in feet.  No history of claudication.  No history of non-healing ulcer, No history of DVT   Pulmonary: No home oxygen, no productive cough, no hemoptysis,  + asthma or wheezing  Musculoskeletal:  [ ]  Arthritis, [ ]  Low back pain,  [ ]  Joint pain  Hematologic:No  history of hypercoagulable state.  No history of easy bleeding.  No history of anemia  Gastrointestinal: No hematochezia or melena,  No gastroesophageal reflux, no trouble swallowing  Urinary: [ ]  chronic Kidney disease, [ ]  on HD - [ ]  MWF or [ ]  TTHS, [ ]  Burning with urination, [ ]  Frequent urination, [ ]  Difficulty urinating;   Skin: No rashes  Psychological: No history of anxiety,  No history of depression   Physical Examination  Vitals:   09/04/20 0915  BP: 125/73  Pulse: 84  Resp: 20  Temp: 98 F (36.7 C)  SpO2: 94%  Weight: 216 lb (98 kg)  Height: 5\' 7"  (1.702 m)    Body mass index is 33.83 kg/m.  General:  Alert and oriented, no acute distress HEENT: Normal Neck: No JVD Cardiac: Regular Rate and Rhythm Skin: No rash or ulcer Extremity Pulses:  2+ radial, brachial, femoral, 2+ right 1+ left dorsalis pedis, absent posterior tibial pulses bilaterally Musculoskeletal: No deformity or edema  Neurologic: Upper and lower extremity motor 5/5 and symmetric  DATA:  Patient had bilateral ABIs performed on July 29, 2020 at [facility] radiology.  Left side was 0.8 right side was 0.9  ASSESSMENT: Patient with mild to moderate symptoms of peripheral arterial disease with claudication.  He states that this currently is not disabling to him.  He agrees that overall he is fairly deconditioned since his coronary bypass grafting.  He also states the shortness of breath bothersome almost as much.  Although his ABIs are not normal he certainly is not at risk of limb loss currently.  I discussed with him today that his risk of limb loss lifetime is less than 5% now that he has quit smoking.   PLAN: 1.  Chronic shortness of breath at rest.  We will set him back up with Dr. Bettina Gavia to see if there is a cardiac etiology for this.  He may  need pulmonary evaluation.  He is almost as limited with his shortness of breath as he is with his leg symptoms.  2.  Peripheral arterial disease mild  to moderate symptoms with 1 block claudication.  Currently no rest pain or tissue loss.  He does not feel disabled by his symptoms.  He will try walking program of 30 minutes daily and continue his cholesterol medication and aspirin.  He will follow up with [place] in 6 months with repeat ABIs.  Will be seen in our APP clinic.  Ruta Hinds, MD [facility] Office: [phone]

## 2020-09-05 ENCOUNTER — Other Ambulatory Visit: Payer: Self-pay

## 2020-09-05 DIAGNOSIS — I739 Peripheral vascular disease, unspecified: Secondary | ICD-10-CM

## 2020-09-10 ENCOUNTER — Ambulatory Visit: Payer: Medicare HMO | Admitting: Cardiology

## 2020-09-15 DIAGNOSIS — H353130 Nonexudative age-related macular degeneration, bilateral, stage unspecified: Secondary | ICD-10-CM | POA: Diagnosis not present

## 2020-09-15 DIAGNOSIS — H25813 Combined forms of age-related cataract, bilateral: Secondary | ICD-10-CM | POA: Diagnosis not present

## 2020-09-26 IMAGING — CR DG CHEST 2V
2 series · 2 of 2 positions shown · non-contrast
Comparison: None.

CLINICAL DATA: Shortness of breath and pain at incision site status
post CABG.

EXAM:
CHEST - 2 VIEW

[w chest pa]
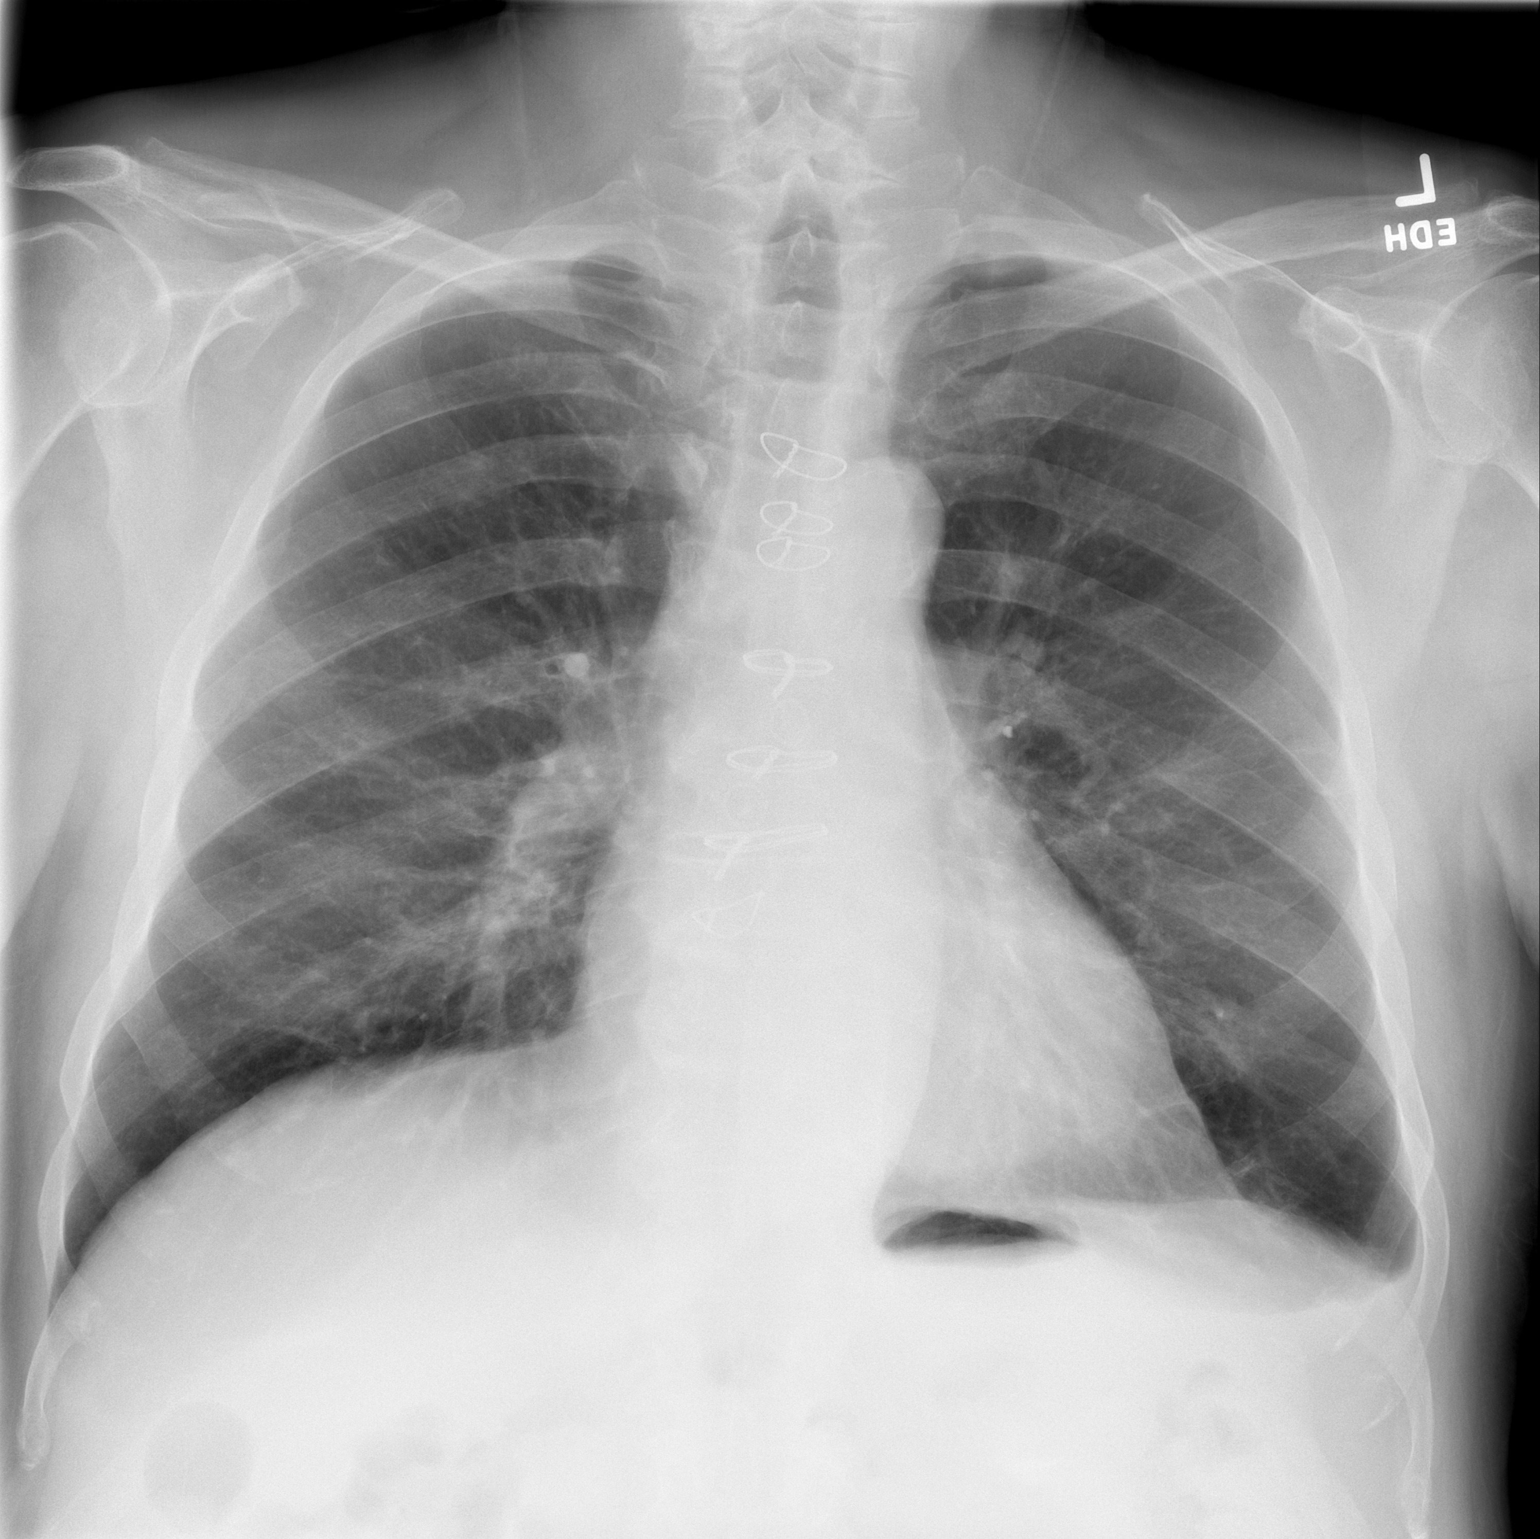

[w chest lat]
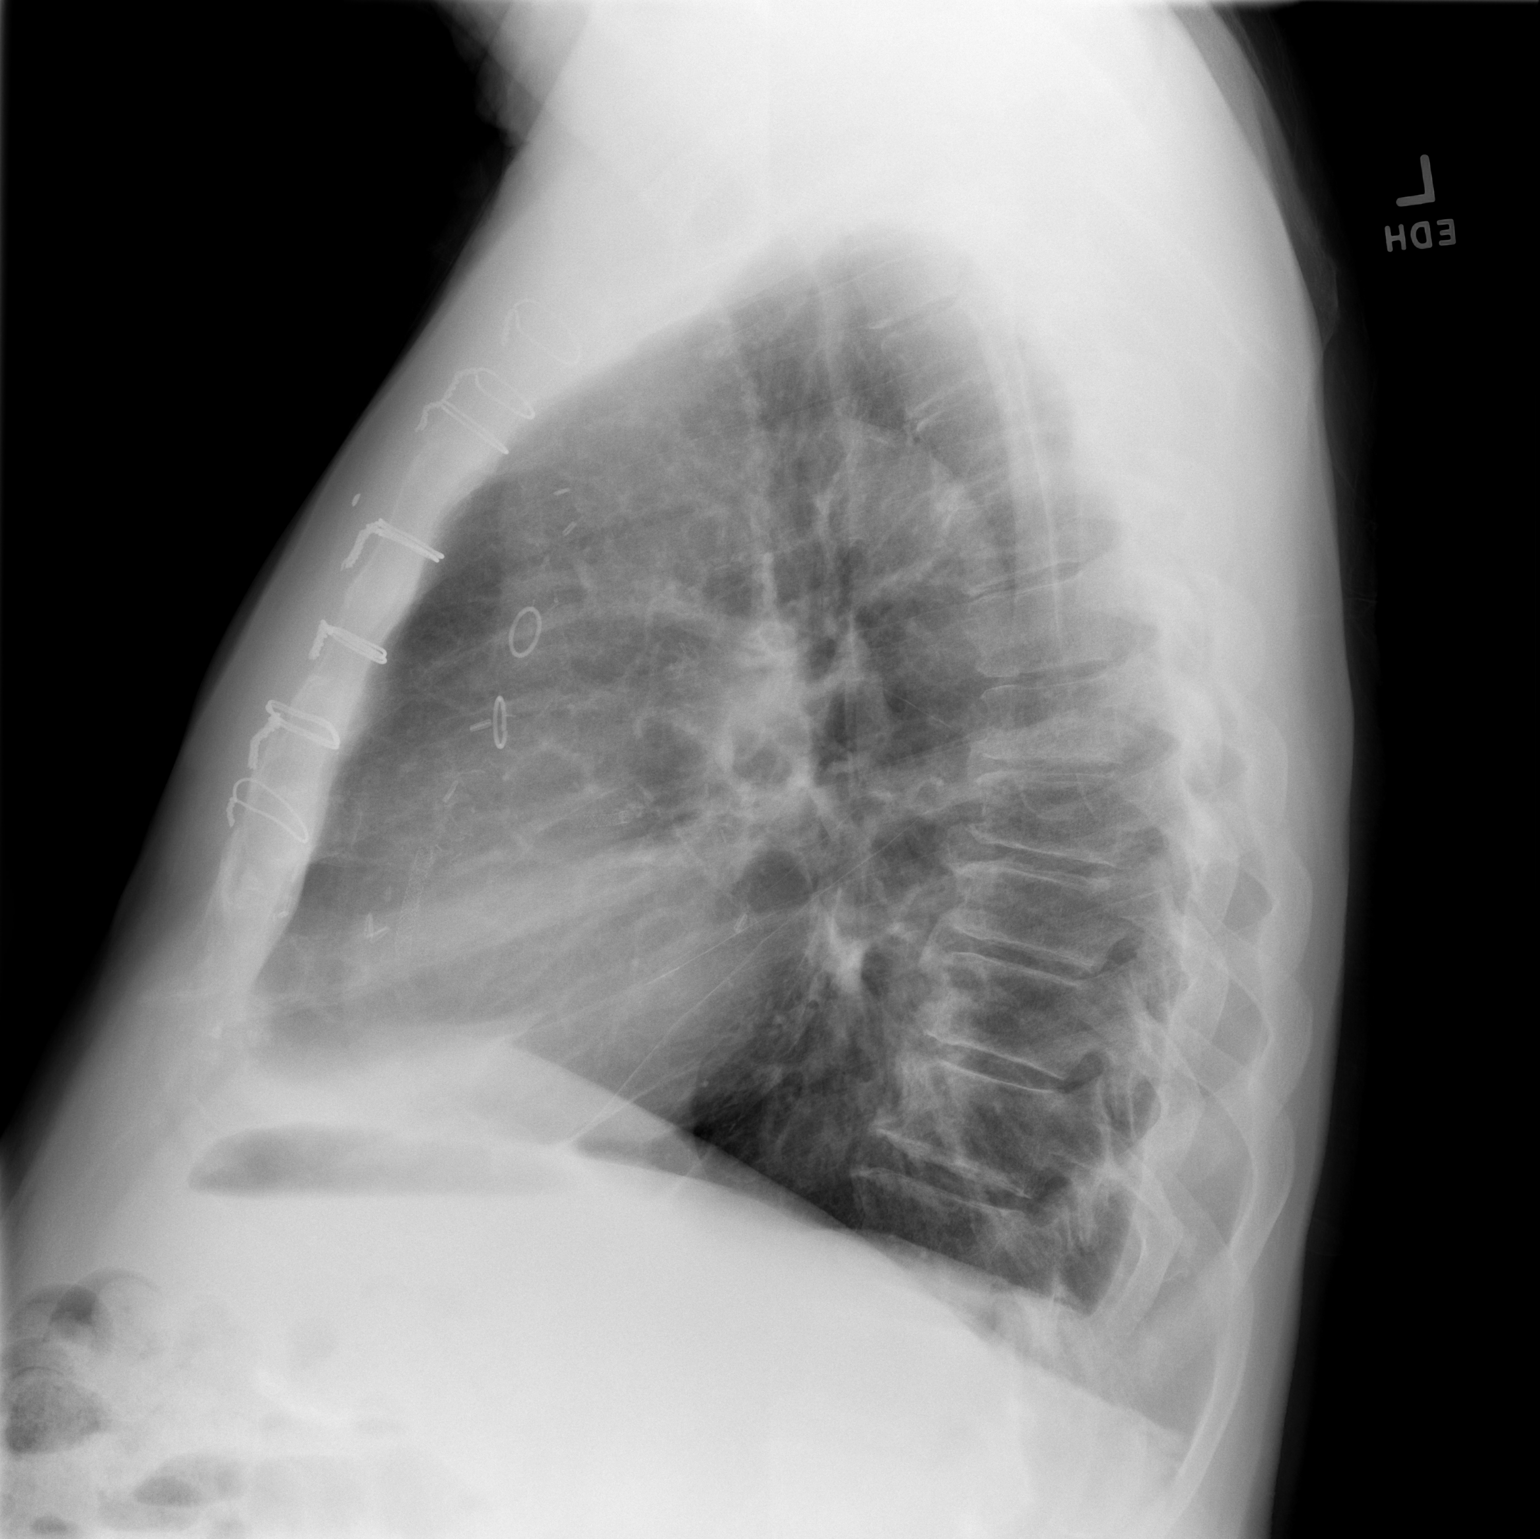

[2 of 2 positions shown; findings below may reference images not displayed]

FINDINGS: Status post median sternotomy and CABG. Persistent small left
greater than right pleural effusions. No consult elation. No
evidence of pulmonary edema.
IMPRESSION: Persistent small bilateral pleural effusions. No acute
cardiopulmonary abnormality.

## 2020-10-02 DIAGNOSIS — H353122 Nonexudative age-related macular degeneration, left eye, intermediate dry stage: Secondary | ICD-10-CM | POA: Diagnosis not present

## 2020-10-02 DIAGNOSIS — H353113 Nonexudative age-related macular degeneration, right eye, advanced atrophic without subfoveal involvement: Secondary | ICD-10-CM | POA: Diagnosis not present

## 2020-10-06 ENCOUNTER — Telehealth: Payer: Self-pay

## 2020-10-06 DIAGNOSIS — H2511 Age-related nuclear cataract, right eye: Secondary | ICD-10-CM | POA: Diagnosis not present

## 2020-10-06 DIAGNOSIS — Z01818 Encounter for other preprocedural examination: Secondary | ICD-10-CM | POA: Diagnosis not present

## 2020-10-06 NOTE — Telephone Encounter (Signed)
   [facility] Pre-operative Risk Assessment    Patient Name: Jeffery Turner  DOB: 10/17/1940  MRN: [id number]   HEARTCARE STAFF: - Please ensure there is not already an duplicate clearance open for this procedure. - Under Visit Info/Reason for Call, type in Other and utilize the format Clearance MM/DD/YY or Clearance TBD. Do not use dashes or single digits. - If request is for dental extraction, please clarify the # of teeth to be extracted.  Request for surgical clearance:  1. What type of surgery is being performed? 10/22/2020   2. When is this surgery scheduled? Cataract extraction by PE, IOL-Right   3. What type of clearance is required (medical clearance vs. Pharmacy clearance to hold med vs. Both)? Medical  4. Are there any medications that need to be held prior to surgery and how long?none   5. Practice name and name of physician performing surgery? [facility], Dr. Dwyane Luo [place]   6. What is the office phone number? [phone] ext [phone]   7.   What is the office fax number? [phone]  8.   Anesthesia type (None, local, MAC, general) ? IV sedation   Truddie Hidden 10/06/2020, 3:51 PM  _________________________________________________________________   (provider comments below)

## 2020-10-08 NOTE — Telephone Encounter (Signed)
   Patient Name: ARSHAN JABS  DOB: 1941/03/25  MRN: [id number]   Primary Cardiologist: Shirlee More, MD  Chart reviewed as part of pre-operative protocol coverage. Cataract extractions are recognized in guidelines as low risk surgeries that do not typically require specific preoperative testing or holding of blood thinner therapy. Therefore, given past medical history and time since last visit, based on ACC/AHA guidelines, Briton A Linsley would be at acceptable risk for the planned procedure without further cardiovascular testing.   I will route this recommendation to the requesting party via Epic fax function and remove from pre-op pool.  Please call with questions.  [place], [place] 10/08/2020, 4:46 PM

## 2020-10-22 DIAGNOSIS — H2511 Age-related nuclear cataract, right eye: Secondary | ICD-10-CM | POA: Diagnosis not present

## 2020-10-23 ENCOUNTER — Encounter: Payer: Self-pay | Admitting: Nurse Practitioner

## 2020-10-23 ENCOUNTER — Ambulatory Visit (INDEPENDENT_AMBULATORY_CARE_PROVIDER_SITE_OTHER): Payer: Medicare HMO | Admitting: Nurse Practitioner

## 2020-10-23 VITALS — BP 140/78 | HR 93 | Temp 97.9°F | Ht 67.0 in | Wt 211.0 lb

## 2020-10-23 DIAGNOSIS — R059 Cough, unspecified: Secondary | ICD-10-CM | POA: Diagnosis not present

## 2020-10-23 DIAGNOSIS — J029 Acute pharyngitis, unspecified: Secondary | ICD-10-CM

## 2020-10-23 DIAGNOSIS — J329 Chronic sinusitis, unspecified: Secondary | ICD-10-CM

## 2020-10-23 LAB — POCT RAPID STREP A (OFFICE): Rapid Strep A Screen: NEGATIVE

## 2020-10-23 LAB — POC COVID19 BINAXNOW: SARS Coronavirus 2 Ag: NEGATIVE

## 2020-10-23 MED ORDER — BENZONATATE 100 MG PO CAPS: 100.0000 mg | ORAL_CAPSULE | Freq: Two times a day (BID) | ORAL | 0 refills | Status: DC | PRN

## 2020-10-23 MED ORDER — AZITHROMYCIN 250 MG PO TABS
ORAL_TABLET | ORAL | 0 refills | Status: AC
Start: 2020-10-23 — End: 2020-10-28

## 2020-10-23 NOTE — Patient Instructions (Addendum)
Take Zpak two tablets today Take Tessalon perles for cough Take Mucinex for chest congestion Push fluids, especially water Follow-up as needed Sinusitis, Adult Sinusitis is soreness and swelling (inflammation) of your sinuses. Sinuses are hollow spaces in the bones around your face. They are located:  Around your eyes.  In the middle of your forehead.  Behind your nose.  In your cheekbones. Your sinuses and nasal passages are lined with a fluid called mucus. Mucus drains out of your sinuses. Swelling can trap mucus in your sinuses. This lets germs (bacteria, virus, or fungus) grow, which leads to infection. Most of the time, this condition is caused by a virus. What are the causes? This condition is caused by:  Allergies.  Asthma.  Germs.  Things that block your nose or sinuses.  Growths in the nose (nasal polyps).  Chemicals or irritants in the air.  Fungus (rare). What increases the risk? You are more likely to develop this condition if:  You have a weak body defense system (immune system).  You do a lot of swimming or diving.  You use nasal sprays too much.  You smoke. What are the signs or symptoms? The main symptoms of this condition are pain and a feeling of pressure around the sinuses. Other symptoms include:  Stuffy nose (congestion).  Runny nose (drainage).  Swelling and warmth in the sinuses.  Headache.  Toothache.  A cough that may get worse at night.  Mucus that collects in the throat or the back of the nose (postnasal drip).  Being unable to smell and taste.  Being very tired (fatigue).  A fever.  Sore throat.  Bad breath. How is this diagnosed? This condition is diagnosed based on:  Your symptoms.  Your medical history.  A physical exam.  Tests to find out if your condition is short-term (acute) or long-term (chronic). Your doctor may: ? Check your nose for growths (polyps). ? Check your sinuses using a tool that has a  light (endoscope). ? Check for allergies or germs. ? Do imaging tests, such as an MRI or CT scan. How is this treated? Treatment for this condition depends on the cause and whether it is short-term or long-term.  If caused by a virus, your symptoms should go away on their own within 10 days. You may be given medicines to relieve symptoms. They include: ? Medicines that shrink swollen tissue in the nose. ? Medicines that treat allergies (antihistamines). ? A spray that treats swelling of the nostrils. ? Rinses that help get rid of thick mucus in your nose (nasal saline washes).  If caused by bacteria, your doctor may wait to see if you will get better without treatment. You may be given antibiotic medicine if you have: ? A very bad infection. ? A weak body defense system.  If caused by growths in the nose, you may need to have surgery. Follow these instructions at home: Medicines  Take, use, or apply over-the-counter and prescription medicines only as told by your doctor. These may include nasal sprays.  If you were prescribed an antibiotic medicine, take it as told by your doctor. Do not stop taking the antibiotic even if you start to feel better. Hydrate and humidify  Drink enough water to keep your pee (urine) pale yellow.  Use a cool mist humidifier to keep the humidity level in your home above 50%.  Breathe in steam for 10-15 minutes, 3-4 times a day, or as told by your doctor. You can do  this in the bathroom while a hot shower is running.  Try not to spend time in cool or dry air.   Rest  Rest as much as you can.  Sleep with your head raised (elevated).  Make sure you get enough sleep each night. General instructions  Put a warm, moist washcloth on your face 3-4 times a day, or as often as told by your doctor. This will help with discomfort.  Wash your hands often with soap and water. If there is no soap and water, use hand sanitizer.  Do not smoke. Avoid being  around people who are smoking (secondhand smoke).  Keep all follow-up visits as told by your doctor. This is important.   Contact a doctor if:  You have a fever.  Your symptoms get worse.  Your symptoms do not get better within 10 days. Get help right away if:  You have a very bad headache.  You cannot stop throwing up (vomiting).  You have very bad pain or swelling around your face or eyes.  You have trouble seeing.  You feel confused.  Your neck is stiff.  You have trouble breathing. Summary  Sinusitis is swelling of your sinuses. Sinuses are hollow spaces in the bones around your face.  This condition is caused by tissues in your nose that become inflamed or swollen. This traps germs. These can lead to infection.  If you were prescribed an antibiotic medicine, take it as told by your doctor. Do not stop taking it even if you start to feel better.  Keep all follow-up visits as told by your doctor. This is important. This information is not intended to replace advice given to you by your health care provider. Make sure you discuss any questions you have with your health care provider. Document Revised: 10/31/2017 Document Reviewed: 10/31/2017 Elsevier Patient Education  2021 [employer].

## 2020-10-23 NOTE — Progress Notes (Signed)
Acute Office Visit  Subjective:    Patient ID: Jeffery Turner, male    DOB: July 15, 1940, 80 y.o.   MRN: [id number]  Chief Complaint  Patient presents with  . Cough    Chest congestion    HPI Patient is in today for sinus congestion, post-nasal-drip, and congested cough. Onset of symptoms was approximately 1-week-ago. He denies previous treatment. Denies chest pain, dyspnea, or body aches.   Past Medical History:  Diagnosis Date  . Anemia   . Angina pectoris ([place]) 02/06/2018  . Angina, class III ([place]) 02/06/2018  . Anginal equivalent ([place]) 09/19/2019  . CAD (coronary artery disease)    s/p stent to CFX 1997 2/2 MI s/p DES to [facility] and DES mLAD 07/14/10 2/2 [place] cath 07/14/10: dLAD 60%; CFX stent ok; OM1 30-40%; pRCA 30%; PDA 40%; EF 60%  . Constant exophthalmos    right  . Coronary artery disease of native artery of native heart with stable angina pectoris ([place]) 07/21/2010   Qualifier: Diagnosis of  By: Denny Peon, CMA, [name]    . Epistaxis 07/21/2010   Qualifier: Diagnosis of  By: Jorene Minors, Scott    . Hearing loss    right eye - no hearing aids  . Hyperlipidemia   . MI (myocardial infarction) ([place])   . Mixed hyperlipidemia 07/21/2010   Qualifier: Diagnosis of  By: Orville Govern CMA, Arbie Cookey    . Old myocardial infarction 07/21/2010   Qualifier: Diagnosis of  By: Hester Mates, Carol    . Osteoarthritis   . OSTEOARTHRITIS 07/21/2010   Qualifier: Diagnosis of  By: Ronne Binning    . 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 Shortness of breath 07/21/2010   Qualifier: Diagnosis of  By: Jorene Minors, Scott    . Thyroid disease   . TOBACCO USE, QUIT 07/21/2010   Qualifier: Diagnosis of  By: Orville Govern CMA, Arbie Cookey    . Transient visual loss, right    wears glasses  . UNSTABLE ANGINA 07/21/2010   Qualifier: Diagnosis of  By: Ronne Binning      Past Surgical History:  Procedure Laterality Date  . CARDIAC CATHETERIZATION    . CORONARY ARTERY BYPASS GRAFT N/A 02/05/2020   Procedure: CORONARY ARTERY BYPASS GRAFTING  (CABG), ON PUMP, TIMES THREE, USING LEFT INTERNAL MAMMARY ARTERY TO LEFT ANTERIOR DESCENDING ARTERY, REVERSE SAPHENOUS VEIN GRAFT TO POSTERIOR DESCENDING ARTERY AND CIRCUMFLEX ARTERY;  Surgeon: Grace Isaac, MD;  Location: [facility];  Service: Open Heart Surgery;  Laterality: N/A;  . ENDOVEIN HARVEST OF GREATER SAPHENOUS VEIN Right 02/05/2020   Procedure: ENDOVEIN HARVEST OF GREATER SAPHENOUS VEIN;  Surgeon: Grace Isaac, MD;  Location: [facility];  Service: Open Heart Surgery;  Laterality: Right;  . LEFT HEART CATH AND CORONARY ANGIOGRAPHY N/A 01/25/2020   Procedure: LEFT HEART CATH AND CORONARY ANGIOGRAPHY;  Surgeon: Leonie Man, MD;  Location: [facility];  Service: Cardiovascular;  Laterality: N/A;  . TEE WITHOUT CARDIOVERSION N/A 02/05/2020   Procedure: TRANSESOPHAGEAL ECHOCARDIOGRAM (TEE);  Surgeon: Grace Isaac, MD;  Location: [facility];  Service: Open Heart Surgery;  Laterality: N/A;  . TONSILLECTOMY AND ADENOIDECTOMY    . WISDOM TOOTH EXTRACTION      Family History  Problem Relation Age of Onset  . Coronary artery disease Other   . Heart failure Mother   . Alzheimer's disease Mother     Social History   Socioeconomic History  . Marital status: Married    Spouse name: Not on file  .  Number of children: Not on file  . Years of education: Not on file  . Highest education level: Not on file  Occupational History  . Not on file  Tobacco Use  . Smoking status: Former Smoker    Packs/day: 1.50    Years: 37.00    Pack years: 55.50    Types: Cigarettes    Quit date: 05/2000    Years since quitting: 20.4  . Smokeless tobacco: Never Used  Vaping Use  . Vaping Use: Never used  Substance and Sexual Activity  . Alcohol use: No  . Drug use: No  . Sexual activity: Not Currently  Other Topics Concern  . Not on file  Social History Narrative   He lives in [place] with family.  He is a retired     Games developer.  He quit tobacco in 1997 with approximately  30-pack-     year history and denies alcohol or drug abuse.         Social Determinants of Health   Financial Resource Strain: Not on file  Food Insecurity: Not on file  Transportation Needs: Not on file  Physical Activity: Sufficiently Active  . Days of Exercise per Week: 5 days  . Minutes of Exercise per Session: 30 min  Stress: Stress Concern Present  . Feeling of Stress : To some extent  Social Connections: Unknown  . Frequency of Communication with Friends and Family: Not on file  . Frequency of Social Gatherings with Friends and Family: Not on file  . Attends Religious Services: Not on file  . Active Member of Clubs or Organizations: Not on file  . Attends Archivist Meetings: Not on file  . Marital Status: Married  Human resources officer Violence: Not on file    Outpatient Medications Prior to Visit  Medication Sig Dispense Refill  . aspirin EC 81 MG tablet Take 1 tablet (81 mg total) by mouth daily. Swallow whole. 90 tablet 3  . Calcium-Magnesium-Zinc (CAL-MAG-ZINC PO) Take 1 tablet by mouth daily.    . COCONUT OIL PO Take 30 mLs by mouth daily.    . dapagliflozin propanediol (FARXIGA) 5 MG TABS tablet Take 1 tablet (5 mg total) by mouth daily. 90 tablet 0  . Evolocumab (REPATHA SURECLICK) XX123456 MG/ML SOAJ Inject 1 mL into the skin every 14 (fourteen) days. 2 mL 6  . levothyroxine (SYNTHROID) 50 MCG tablet Take 1 tablet (50 mcg total) by mouth daily. 30 tablet 2  . metoprolol tartrate (LOPRESSOR) 25 MG tablet Take 1 tablet (25 mg total) by mouth 2 (two) times daily. 180 tablet 2  . mometasone-formoterol (DULERA) 100-5 MCG/ACT AERO Inhale 2 puffs into the lungs 2 (two) times daily. 1 each 1  . Multiple Vitamin (MULTIVITAMIN WITH MINERALS) TABS tablet Take 1 tablet by mouth daily.    . nitroGLYCERIN (NITROSTAT) 0.4 MG SL tablet Place 1 tablet (0.4 mg total) under the tongue every 5 (five) minutes as needed. If require 2, recommend call 911. 50 tablet 0   No  facility-administered medications prior to visit.    Allergies  Allergen Reactions  . Bactrim [Sulfamethoxazole-Trimethoprim] Shortness Of Breath    Breathing problems.  Rt ear - hearing loss.   . Codeine Nausea And Vomiting  . Statins Other (See Comments)    Patient preference to not take statins/cholesterol meds    Review of Systems  Constitutional: Negative for appetite change, fatigue and unexpected weight change.  HENT: Positive for congestion, postnasal drip, rhinorrhea, sinus pressure and  sinus pain. Negative for ear pain and tinnitus.   Eyes: Negative for pain.  Respiratory: Positive for cough. Negative for shortness of breath.   Cardiovascular: Negative for chest pain, palpitations and leg swelling.  Gastrointestinal: Negative for abdominal pain, constipation, diarrhea, nausea and vomiting.  Endocrine: Negative for cold intolerance, heat intolerance, polydipsia, polyphagia and polyuria.  Genitourinary: Negative for dysuria, frequency and hematuria.  Musculoskeletal: Negative for arthralgias, back pain, joint swelling and myalgias.  Skin: Negative for rash.  Allergic/Immunologic: Negative for environmental allergies.  Neurological: Negative for dizziness and headaches.  Hematological: Negative for adenopathy.  Psychiatric/Behavioral: Negative for decreased concentration and sleep disturbance. The patient is not nervous/anxious.        Objective:    Physical Exam Vitals reviewed.  Constitutional:      Appearance: Normal appearance.  HENT:     Head: Normocephalic.     Right Ear: Tympanic membrane normal.     Left Ear: Tympanic membrane normal.     Nose: Congestion and rhinorrhea present.     Mouth/Throat:     Mouth: Mucous membranes are moist.     Pharynx: Posterior oropharyngeal erythema present.  Cardiovascular:     Rate and Rhythm: Normal rate and regular rhythm.     Pulses: Normal pulses.     Heart sounds: Normal heart sounds.  Pulmonary:     Effort:  Pulmonary effort is normal.     Breath sounds: Normal breath sounds.  Abdominal:     General: Bowel sounds are normal.     Palpations: Abdomen is soft.  Musculoskeletal:        General: Normal range of motion.     Cervical back: Neck supple.  Skin:    General: Skin is warm and dry.     Capillary Refill: Capillary refill takes less than 2 seconds.  Neurological:     General: No focal deficit present.     Mental Status: He is alert and oriented to person, place, and time.  Psychiatric:        Mood and Affect: Mood normal.        Behavior: Behavior normal.     BP 140/78 (BP Location: Left Arm, Patient Position: Sitting)   Pulse 93   Temp 97.9 F (36.6 C) (Temporal)   Ht 5\' 7"  (1.702 m)   Wt 211 lb (95.7 kg)   SpO2 95%   BMI 33.05 kg/m  Wt Readings from Last 3 Encounters:  10/23/20 211 lb (95.7 kg)  09/04/20 216 lb (98 kg)  08/25/20 215 lb (97.5 kg)    Health Maintenance Due  Topic Date Due  . Hepatitis C Screening  Never done     Lab Results  Component Value Date   TSH 12.300 (H) 07/23/2020   Lab Results  Component Value Date   WBC 4.8 07/23/2020   HGB 14.2 07/23/2020   HCT 41.0 07/23/2020   MCV 87 07/23/2020   PLT 288 07/23/2020   Lab Results  Component Value Date   NA 137 07/23/2020   K 4.7 07/23/2020   CO2 20 07/23/2020   GLUCOSE 103 (H) 07/23/2020   BUN 17 07/23/2020   CREATININE 1.07 07/23/2020   BILITOT 0.4 07/23/2020   ALKPHOS 50 07/23/2020   AST 13 07/23/2020   ALT 14 07/23/2020   PROT 6.8 07/23/2020   ALBUMIN 4.4 07/23/2020   CALCIUM 9.0 07/23/2020   ANIONGAP 11 02/10/2020   Lab Results  Component Value Date   CHOL 225 (H) 07/23/2020   Lab  Results  Component Value Date   HDL 35 (L) 07/23/2020   Lab Results  Component Value Date   LDLCALC 167 (H) 07/23/2020   Lab Results  Component Value Date   TRIG 128 07/23/2020   Lab Results  Component Value Date   CHOLHDL 6.4 (H) 07/23/2020   Lab Results  Component Value Date    HGBA1C 6.2 (H) 07/23/2020       Assessment & Plan:    1. Pharyngitis, unspecified etiology - POCT rapid strep A  2. Cough - POC COVID-19 BinaxNow - benzonatate (TESSALON) 100 MG capsule; Take 1 capsule (100 mg total) by mouth 2 (two) times daily as needed for cough.  Dispense: 20 capsule; Refill: 0  3. Other sinusitis, unspecified chronicity - azithromycin (ZITHROMAX) 250 MG tablet; Take 2 tablets on day 1, then 1 tablet daily on days 2 through 5  Dispense: 6 tablet; Refill: 0     Problem List Items Addressed This Visit   None   Visit Diagnoses    Pharyngitis, unspecified etiology    -  Primary   Relevant Orders   POCT rapid strep A (Completed)   Cough       Relevant Orders   POC COVID-19 BinaxNow (Completed)     Take Zpak two tablets today Take Tessalon perles for cough Take Mucinex for chest congestion Push fluids, especially water Follow-up as needed  Follow-up: As needed  Signed, Rip Harbour, NP

## 2020-11-05 DIAGNOSIS — H2512 Age-related nuclear cataract, left eye: Secondary | ICD-10-CM | POA: Diagnosis not present

## 2021-02-04 ENCOUNTER — Telehealth: Payer: Self-pay

## 2021-02-04 NOTE — Telephone Encounter (Signed)
PA Case: [id number], Status: Approved, Coverage Starts on: 06/14/2020 12:00:00 AM, Coverage Ends on: 06/13/2021 12:00:00 AM.

## 2021-02-13 DIAGNOSIS — E038 Other specified hypothyroidism: Secondary | ICD-10-CM

## 2021-02-13 DIAGNOSIS — E039 Hypothyroidism, unspecified: Secondary | ICD-10-CM | POA: Insufficient documentation

## 2021-02-13 DIAGNOSIS — E079 Disorder of thyroid, unspecified: Secondary | ICD-10-CM | POA: Insufficient documentation

## 2021-02-13 HISTORY — DX: Other specified hypothyroidism: E03.8

## 2021-02-25 ENCOUNTER — Ambulatory Visit: Payer: Medicare HMO | Admitting: Cardiology

## 2021-03-06 ENCOUNTER — Encounter: Payer: Self-pay | Admitting: Nurse Practitioner

## 2021-03-06 ENCOUNTER — Other Ambulatory Visit: Payer: Self-pay

## 2021-03-06 ENCOUNTER — Ambulatory Visit (INDEPENDENT_AMBULATORY_CARE_PROVIDER_SITE_OTHER): Payer: Medicare HMO | Admitting: Nurse Practitioner

## 2021-03-06 VITALS — BP 112/68 | HR 67 | Temp 96.0°F | Ht 67.0 in | Wt 214.0 lb

## 2021-03-06 DIAGNOSIS — K130 Diseases of lips: Secondary | ICD-10-CM

## 2021-03-06 MED ORDER — TRIAMCINOLONE ACETONIDE 0.025 % EX OINT: 1.0000 "application " | TOPICAL_OINTMENT | Freq: Two times a day (BID) | CUTANEOUS | 0 refills | Status: DC

## 2021-03-06 NOTE — Progress Notes (Signed)
Established Patient Office Visit  Subjective:  Patient ID: Jeffery Turner, male    DOB: 09-Jan-1941  Age: 80 y.o. MRN: [id number]  CC:  Chief Complaint  Patient presents with   Mouth Lesions    HPI Jeffery Turner presents for mid-upper lip lesion. Onset was approximately one-week ago. Treatment has included applying chap stick several times daily. Denies fever, pain, itching, or drainage from site. States he initially thought he bit his upper lip in his sleep; he does not think that is the case now. He denies previous history of similar lip lesion.   Past Medical History:  Diagnosis Date   Anemia    Angina pectoris ([place]) 02/06/2018   Angina, class III ([place]) 02/06/2018   Anginal equivalent ([place]) 09/19/2019   CAD (coronary artery disease)    s/p stent to CFX 1997 2/2 MI s/p DES to [facility] and DES mLAD 07/14/10 2/2 [place] cath 07/14/10: dLAD 60%; CFX stent ok; OM1 30-40%; pRCA 30%; PDA 40%; EF 60%   Constant exophthalmos    right   Coronary artery disease of native artery of native heart with stable angina pectoris ([place]) 07/21/2010   Qualifier: Diagnosis of  By: Denny Peon, CMA, [name]     Epistaxis 07/21/2010   Qualifier: Diagnosis of  By: Jorene Minors, Scott     Hearing loss    right eye - no hearing aids   Hyperlipidemia    MI (myocardial infarction) ([place])    Mixed hyperlipidemia 07/21/2010   Qualifier: Diagnosis of  By: Orville Govern, CMA, Carol     Old myocardial infarction 07/21/2010   Qualifier: Diagnosis of  By: Orville Govern CMA, Carol     Osteoarthritis    OSTEOARTHRITIS 07/21/2010   Qualifier: Diagnosis of  By: Orville Govern CMA, Carol     S/P CABG x 3 02/05/2020   Shortness of breath 07/21/2010   Qualifier: Diagnosis of  By: Jorene Minors, Scott     Thyroid disease    TOBACCO USE, QUIT 07/21/2010   Qualifier: Diagnosis of  By: Orville Govern, CMA, Carol     Transient visual loss, right    wears glasses   UNSTABLE ANGINA 07/21/2010   Qualifier: Diagnosis of  By: Ronne Binning      Past Surgical History:  Procedure  Laterality Date   CARDIAC CATHETERIZATION     CORONARY ARTERY BYPASS GRAFT N/A 02/05/2020   Procedure: CORONARY ARTERY BYPASS GRAFTING (CABG), ON PUMP, TIMES THREE, USING LEFT INTERNAL MAMMARY ARTERY TO LEFT ANTERIOR DESCENDING ARTERY, REVERSE SAPHENOUS VEIN GRAFT TO POSTERIOR DESCENDING ARTERY AND CIRCUMFLEX ARTERY;  Surgeon: Grace Isaac, MD;  Location: [place];  Service: Open Heart Surgery;  Laterality: N/A;   ENDOVEIN HARVEST OF GREATER SAPHENOUS VEIN Right 02/05/2020   Procedure: ENDOVEIN HARVEST OF GREATER SAPHENOUS VEIN;  Surgeon: Grace Isaac, MD;  Location: [place];  Service: Open Heart Surgery;  Laterality: Right;   LEFT HEART CATH AND CORONARY ANGIOGRAPHY N/A 01/25/2020   Procedure: LEFT HEART CATH AND CORONARY ANGIOGRAPHY;  Surgeon: Leonie Man, MD;  Location: [place] [facility];  Service: Cardiovascular;  Laterality: N/A;   TEE WITHOUT CARDIOVERSION N/A 02/05/2020   Procedure: TRANSESOPHAGEAL ECHOCARDIOGRAM (TEE);  Surgeon: Grace Isaac, MD;  Location: [place];  Service: Open Heart Surgery;  Laterality: N/A;   TONSILLECTOMY AND ADENOIDECTOMY     WISDOM TOOTH EXTRACTION      Family History  Problem Relation Age of Onset   Coronary artery disease Other    Heart failure Mother  Alzheimer's disease Mother     Social History   Socioeconomic History   Marital status: Married    Spouse name: Not on file   Number of children: Not on file   Years of education: Not on file   Highest education level: Not on file  Occupational History   Not on file  Tobacco Use   Smoking status: Former    Packs/day: 1.50    Years: 37.00    Pack years: 55.50    Types: Cigarettes    Quit date: 05/2000    Years since quitting: 20.8   Smokeless tobacco: Never  Vaping Use   Vaping Use: Never used  Substance and Sexual Activity   Alcohol use: No   Drug use: No   Sexual activity: Not Currently  Other Topics Concern   Not on file  Social History Narrative   He lives in  [place] with family.  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         Social Determinants of Health   Financial Resource Strain: Not on file  Food Insecurity: Not on file  Transportation Needs: Not on file  Physical Activity: Sufficiently Active   Days of Exercise per Week: 5 days   Minutes of Exercise per Session: 30 min  Stress: Stress Concern Present   Feeling of Stress : To some extent  Social Connections: Unknown   Frequency of Communication with Friends and Family: Not on file   Frequency of Social Gatherings with Friends and Family: Not on file   Attends Religious Services: Not on Electrical engineer or Organizations: Not on file   Attends Archivist Meetings: Not on file   Marital Status: Married  Human resources officer Violence: Not on file    Outpatient Medications Prior to Visit  Medication Sig Dispense Refill   aspirin EC 81 MG tablet Take 1 tablet (81 mg total) by mouth daily. Swallow whole. 90 tablet 3   Calcium-Magnesium-Zinc (CAL-MAG-ZINC PO) Take 1 tablet by mouth daily.     COCONUT OIL PO Take 30 mLs by mouth daily.     Multiple Vitamin (MULTIVITAMIN WITH MINERALS) TABS tablet Take 1 tablet by mouth daily.     nitroGLYCERIN (NITROSTAT) 0.4 MG SL tablet Place 1 tablet (0.4 mg total) under the tongue every 5 (five) minutes as needed. If require 2, recommend call 911. 50 tablet 0   benzonatate (TESSALON) 100 MG capsule Take 1 capsule (100 mg total) by mouth 2 (two) times daily as needed for cough. 20 capsule 0   dapagliflozin propanediol (FARXIGA) 5 MG TABS tablet Take 1 tablet (5 mg total) by mouth daily. 90 tablet 0   Evolocumab (REPATHA SURECLICK) 147 MG/ML SOAJ Inject 1 mL into the skin every 14 (fourteen) days. 2 mL 6   levothyroxine (SYNTHROID) 50 MCG tablet Take 1 tablet (50 mcg total) by mouth daily. 30 tablet 2   metoprolol tartrate  (LOPRESSOR) 25 MG tablet Take 1 tablet (25 mg total) by mouth 2 (two) times daily. 180 tablet 2   mometasone-formoterol (DULERA) 100-5 MCG/ACT AERO Inhale 2 puffs into the lungs 2 (two) times daily. 1 each 1   No facility-administered medications prior to visit.    Allergies  Allergen Reactions   Bactrim [Sulfamethoxazole-Trimethoprim] Shortness Of Breath    Breathing problems.  Rt ear - hearing  loss.    Codeine Nausea And Vomiting   Statins Other (See Comments)    Patient preference to not take statins/cholesterol meds    ROS Review of Systems  Constitutional:  Negative for chills, fatigue and fever.  HENT:  Positive for hearing loss (hard of hearing).   Eyes: Negative.   Respiratory: Negative.    Cardiovascular: Negative.   Gastrointestinal:  Negative for constipation, diarrhea, nausea and vomiting.  Endocrine: Negative.   Genitourinary: Negative.   Musculoskeletal: Negative.   Skin:  Negative for rash.       Upper mid-lip lesion  Allergic/Immunologic: Negative.   Neurological: Negative.   Hematological:  Negative for adenopathy.  Psychiatric/Behavioral: Negative.       Objective:    Physical Exam Vitals reviewed.  Constitutional:      Appearance: Normal appearance.  HENT:     Head: Normocephalic.     Right Ear: Tympanic membrane normal.     Left Ear: Tympanic membrane normal.     Nose: Nose normal.     Mouth/Throat:     Lips: Lesions present.     Mouth: Mucous membranes are dry.     Dentition: Has dentures.     Tongue: No lesions. Tongue does not deviate from midline.     Palate: No mass and lesions.      Comments: Fissure like lip lesion to inside of upper lip, approximately 2 cm in length Musculoskeletal:     Cervical back: Neck supple.  Lymphadenopathy:     Cervical: No cervical adenopathy.  Neurological:     Mental Status: He is alert.    BP 112/68 (BP Location: Left Arm, Patient Position: Sitting, Cuff Size: Normal)   Pulse 67   Temp (!) 96 F  (35.6 C) (Temporal)   Ht 5\' 7"  (1.702 m)   Wt 214 lb (97.1 kg)   SpO2 95%   BMI 33.52 kg/m  Wt Readings from Last 3 Encounters:  03/06/21 214 lb (97.1 kg)  10/23/20 211 lb (95.7 kg)  09/04/20 216 lb (98 kg)     Health Maintenance Due  Topic Date Due   Hepatitis C Screening  Never done   Zoster Vaccines- Shingrix (1 of 2) Never done   COVID-19 Vaccine (3 - J[facility]sen risk series) 04/22/2020    Lab Results  Component Value Date   TSH 12.300 (H) 07/23/2020   Lab Results  Component Value Date   WBC 4.8 07/23/2020   HGB 14.2 07/23/2020   HCT 41.0 07/23/2020   MCV 87 07/23/2020   PLT 288 07/23/2020   Lab Results  Component Value Date   NA 137 07/23/2020   K 4.7 07/23/2020   CO2 20 07/23/2020   GLUCOSE 103 (H) 07/23/2020   BUN 17 07/23/2020   CREATININE 1.07 07/23/2020   BILITOT 0.4 07/23/2020   ALKPHOS 50 07/23/2020   AST 13 07/23/2020   ALT 14 07/23/2020   PROT 6.8 07/23/2020   ALBUMIN 4.4 07/23/2020   CALCIUM 9.0 07/23/2020   ANIONGAP 11 02/10/2020   Lab Results  Component Value Date   CHOL 225 (H) 07/23/2020   Lab Results  Component Value Date   HDL 35 (L) 07/23/2020   Lab Results  Component Value Date   LDLCALC 167 (H) 07/23/2020   Lab Results  Component Value Date   TRIG 128 07/23/2020   Lab Results  Component Value Date   CHOLHDL 6.4 (H) 07/23/2020   Lab Results  Component Value Date   HGBA1C 6.2 (H)  07/23/2020      Assessment & Plan:    1. Cheilitis - triamcinolone (KENALOG) 0.025 % ointment; Apply 1 application topically 2 (two) times daily.  Dispense: 30 g; Refill: 0   Apply Kenalog ointment to lip twice daily for 1-week Do not eat or drink anything for one hour after applying ointment Follow-up in one-week If lip lesion does not heal we will refer you to dermatology   Follow-up: 1-week   I, Rip Harbour, NP, have reviewed all documentation for this visit. The documentation on 03/07/21 for the exam, diagnosis, procedures,  and orders are all accurate and complete.    Signed, Rip Harbour, NP 03/06/21 at 8:18 PM

## 2021-03-06 NOTE — Patient Instructions (Signed)
Apply Kenalog ointment to lip twice daily for 1-week Do not eat or drink anything for one hour after applying ointment Follow-up in one-week If lip lesion does not heal we will refer you to dermatology  Stomatitis Stomatitis is a condition that causes swelling (inflammation) in your mouth. It can affect all or part of the inside of the mouth. The condition often affects your cheek, teeth, gums, lips, and tongue. It can also affect the tissues that produce mucus in your mouth (mucosa). Pain from stomatitis can make it hard for you to eat or drink. Very bad cases of this condition can lead to: Not getting enough fluid in your body (dehydration). Poor nutrition. Follow these instructions at home: Medicines Take over-the-counter and prescription medicines only as told by your doctor. If you were given an antibiotic medicine, take it as told by your doctor. Do not stop taking the medicine even if you start to feel better. Do not use products that contain benzocaine in children who are younger than 2 years. Do not drive or use heavy machinery while taking prescription pain medicine. Eating and drinking Eat a balanced diet. Do not eat: Spicy foods. Citrus, such as oranges. Foods that have sharp edges, such as chips. Do not eat any foods that you think may be causing this condition. Do not drink alcohol. Drink enough fluid to keep your pee (urine) pale yellow. This will keep you hydrated. Lifestyle   Take good care of your mouth and teeth (oral hygiene): Gently brush your teeth with a soft toothbrush. Do this 2 times each day. Floss your teeth every day. Have your teeth cleaned regularly. Do this as told by your dentist. If you have dentures, make sure that they fit the way that they should. Do not use any products that contain nicotine or tobacco, such as cigarettes and e-cigarettes. If you need help quitting, ask your doctor. Find ways to lower your stress. Try yoga or meditation. Ask your  doctor for other ideas. General instructions Use a salt-water rinse for pain as told by your doctor. Keep all follow-up visits as told by your doctor. This is important. Contact a doctor if: Your symptoms get worse. You have new symptoms, like: A rash. New symptoms that do not involve your mouth area. Your symptoms last longer than 3 weeks. Your symptoms go away and then come back. You are more tired. You feel weaker. You stop feeling hungry. You feel sick to your stomach (nauseous). Get help right away if you: Have a fever. Are not able to eat or drink. Have a lot of bleeding in your mouth. Summary Stomatitis is a condition that causes swelling in your mouth. Pain from stomatitis can make it hard for you to eat or drink. Very bad cases of this condition can lead to not getting enough fluid in your body or poor nutrition. Take medicines only as told by your doctor. Follow instructions from your doctor on diet and lifestyle changes to help manage your symptoms. This information is not intended to replace advice given to you by your health care provider. Make sure you discuss any questions you have with your health care provider. Document Revised: 06/28/2017 Document Reviewed: 06/28/2017 Elsevier Patient Education  2022 [employer].

## 2021-03-13 ENCOUNTER — Ambulatory Visit (INDEPENDENT_AMBULATORY_CARE_PROVIDER_SITE_OTHER): Payer: Medicare HMO | Admitting: Family Medicine

## 2021-03-13 ENCOUNTER — Other Ambulatory Visit: Payer: Self-pay

## 2021-03-13 VITALS — BP 118/64 | HR 88 | Temp 96.7°F | Ht 67.0 in | Wt 215.0 lb

## 2021-03-13 DIAGNOSIS — K130 Diseases of lips: Secondary | ICD-10-CM | POA: Diagnosis not present

## 2021-03-13 MED ORDER — KETOCONAZOLE 2 % EX CREA: 1.0000 "application " | TOPICAL_CREAM | Freq: Two times a day (BID) | CUTANEOUS | 0 refills | Status: DC

## 2021-03-13 NOTE — Progress Notes (Signed)
Subjective:  Patient ID: Jeffery Turner, male    DOB: [birth year]  Age: 80 y.o. MRN: [id number]  Chief Complaint  Patient presents with   1 wk f/u    HPI Patient states that triamcinolone cream has not healed the lesion on his right upper lip. Has been applying triamcinolone cream 2 times daily and sometimes more than twice daily. Has been using vaseline.  Current Outpatient Medications on File Prior to Visit  Medication Sig Dispense Refill   aspirin EC 81 MG tablet Take 1 tablet (81 mg total) by mouth daily. Swallow whole. 90 tablet 3   Calcium-Magnesium-Zinc (CAL-MAG-ZINC PO) Take 1 tablet by mouth daily.     COCONUT OIL PO Take 30 mLs by mouth daily.     Multiple Vitamin (MULTIVITAMIN WITH MINERALS) TABS tablet Take 1 tablet by mouth daily.     nitroGLYCERIN (NITROSTAT) 0.4 MG SL tablet Place 1 tablet (0.4 mg total) under the tongue every 5 (five) minutes as needed. If require 2, recommend call 911. 50 tablet 0   triamcinolone (KENALOG) 0.025 % ointment Apply 1 application topically 2 (two) times daily. 30 g 0   No current facility-administered medications on file prior to visit.   Past Medical History:  Diagnosis Date   Anemia    Angina pectoris ([place]) 02/06/2018   Angina, class III ([place]) 02/06/2018   Anginal equivalent ([place]) 09/19/2019   CAD (coronary artery disease)    s/p stent to CFX 1997 2/2 MI s/p DES to [facility] and DES mLAD 07/14/10 2/2 [place] cath 07/14/10: dLAD 60%; CFX stent ok; OM1 30-40%; pRCA 30%; PDA 40%; EF 60%   Constant exophthalmos    right   Coronary artery disease of native artery of native heart with stable angina pectoris ([place]) 07/21/2010   Qualifier: Diagnosis of  By: Denny Peon, CMA, [name]     Epistaxis 07/21/2010   Qualifier: Diagnosis of  By: Jorene Minors, Scott     Hearing loss    right eye - no hearing aids   Hyperlipidemia    MI (myocardial infarction) ([place])    Mixed hyperlipidemia 07/21/2010   Qualifier: Diagnosis of  By: Orville Govern, CMA, Carol     Old myocardial  infarction 07/21/2010   Qualifier: Diagnosis of  By: Orville Govern CMA, Carol     Osteoarthritis    OSTEOARTHRITIS 07/21/2010   Qualifier: Diagnosis of  By: Orville Govern CMA, Carol     S/P CABG x 3 02/05/2020   Shortness of breath 07/21/2010   Qualifier: Diagnosis of  By: Jorene Minors, Scott     Thyroid disease    TOBACCO USE, QUIT 07/21/2010   Qualifier: Diagnosis of  By: Orville Govern, CMA, Carol     Transient visual loss, right    wears glasses   UNSTABLE ANGINA 07/21/2010   Qualifier: Diagnosis of  By: Ronne Binning     Past Surgical History:  Procedure Laterality Date   CARDIAC CATHETERIZATION     CORONARY ARTERY BYPASS GRAFT N/A 02/05/2020   Procedure: CORONARY ARTERY BYPASS GRAFTING (CABG), ON PUMP, TIMES THREE, USING LEFT INTERNAL MAMMARY ARTERY TO LEFT ANTERIOR DESCENDING ARTERY, REVERSE SAPHENOUS VEIN GRAFT TO POSTERIOR DESCENDING ARTERY AND CIRCUMFLEX ARTERY;  Surgeon: Grace Isaac, MD;  Location: [place];  Service: Open Heart Surgery;  Laterality: N/A;   ENDOVEIN HARVEST OF GREATER SAPHENOUS VEIN Right 02/05/2020   Procedure: ENDOVEIN HARVEST OF GREATER SAPHENOUS VEIN;  Surgeon: Grace Isaac, MD;  Location: [place];  Service: Open Heart Surgery;  Laterality: Right;  LEFT HEART CATH AND CORONARY ANGIOGRAPHY N/A 01/25/2020   Procedure: LEFT HEART CATH AND CORONARY ANGIOGRAPHY;  Surgeon: Leonie Man, MD;  Location: [place] [facility];  Service: Cardiovascular;  Laterality: N/A;   TEE WITHOUT CARDIOVERSION N/A 02/05/2020   Procedure: TRANSESOPHAGEAL ECHOCARDIOGRAM (TEE);  Surgeon: Grace Isaac, MD;  Location: [place];  Service: Open Heart Surgery;  Laterality: N/A;   TONSILLECTOMY AND ADENOIDECTOMY     WISDOM TOOTH EXTRACTION      Family History  Problem Relation Age of Onset   Coronary artery disease Other    Heart failure Mother    Alzheimer's disease Mother    Social History   Socioeconomic History   Marital status: Married    Spouse name: Not on file   Number of children: Not  on file   Years of education: Not on file   Highest education level: Not on file  Occupational History   Not on file  Tobacco Use   Smoking status: Former    Packs/day: 1.50    Years: 37.00    Pack years: 55.50    Types: Cigarettes    Quit date: 05/2000    Years since quitting: 20.8   Smokeless tobacco: Never  Vaping Use   Vaping Use: Never used  Substance and Sexual Activity   Alcohol use: No   Drug use: No   Sexual activity: Not Currently  Other Topics Concern   Not on file  Social History Narrative   He lives in [place] with family.  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         Social Determinants of Health   Financial Resource Strain: Not on file  Food Insecurity: Not on file  Transportation Needs: Not on file  Physical Activity: Sufficiently Active   Days of Exercise per Week: 5 days   Minutes of Exercise per Session: 30 min  Stress: Stress Concern Present   Feeling of Stress : To some extent  Social Connections: Unknown   Frequency of Communication with Friends and Family: Not on file   Frequency of Social Gatherings with Friends and Family: Not on file   Attends Religious Services: Not on file   Active Member of Clubs or Organizations: Not on file   Attends Archivist Meetings: Not on file   Marital Status: Married    Review of Systems  Constitutional:  Negative for chills, diaphoresis, fatigue and fever.  HENT:  Negative for congestion, ear pain and sore throat.   Respiratory:  Negative for cough and shortness of breath.   Cardiovascular:  Negative for chest pain and leg swelling.    Objective:  BP 118/64   Pulse 88   Temp (!) 96.7 F (35.9 C)   Ht 5\' 7"  (1.702 m)   Wt 215 lb (97.5 kg)   SpO2 94%   BMI 33.67 kg/m   BP/Weight 03/13/2021 03/06/2021 9/93/7169  Systolic BP 678 938 101  Diastolic BP 64 68 78  Wt. (Lbs) 215 214 211   BMI 33.67 33.52 33.05    Physical Exam Vitals reviewed.  Constitutional:      Appearance: Normal appearance. He is normal weight.  HENT:     Mouth/Throat:     Comments: Right upper lip linear sore. No other sores on lips.  Neurological:     Mental Status: He is  alert and oriented to person, place, and time.  Psychiatric:        Mood and Affect: Mood normal.        Behavior: Behavior normal.    Diabetic Foot Exam - Simple   No data filed      Lab Results  Component Value Date   WBC 4.8 07/23/2020   HGB 14.2 07/23/2020   HCT 41.0 07/23/2020   PLT 288 07/23/2020   GLUCOSE 103 (H) 07/23/2020   CHOL 225 (H) 07/23/2020   TRIG 128 07/23/2020   HDL 35 (L) 07/23/2020   LDLDIRECT 152.5 10/05/2010   LDLCALC 167 (H) 07/23/2020   ALT 14 07/23/2020   AST 13 07/23/2020   NA 137 07/23/2020   K 4.7 07/23/2020   CL 99 07/23/2020   CREATININE 1.07 07/23/2020   BUN 17 07/23/2020   CO2 20 07/23/2020   TSH 12.300 (H) 07/23/2020   INR 1.4 (H) 02/05/2020   HGBA1C 6.2 (H) 07/23/2020      Assessment & Plan:   Problem List Items Addressed This Visit       Digestive   Cheilitis - Primary    Similar to cheilitis, but vertical lesion is in body of rt upper lip.  I recommend nizoral cream. Apply bid.      .  Meds ordered this encounter  Medications   ketoconazole (NIZORAL) 2 % cream    Sig: Apply 1 application topically 2 (two) times daily.    Dispense:  15 g    Refill:  0       Follow-up: Return if symptoms worsen or fail to improve.  An After Visit Summary was printed and given to the patient.  Rochel Brome, MD [facility] [phone]

## 2021-03-14 ENCOUNTER — Encounter: Payer: Self-pay | Admitting: Family Medicine

## 2021-03-14 DIAGNOSIS — K130 Diseases of lips: Secondary | ICD-10-CM | POA: Insufficient documentation

## 2021-03-14 NOTE — Assessment & Plan Note (Signed)
Similar to cheilitis, but vertical lesion is in body of rt upper lip.  I recommend nizoral cream. Apply bid.

## 2021-04-02 ENCOUNTER — Ambulatory Visit (INDEPENDENT_AMBULATORY_CARE_PROVIDER_SITE_OTHER): Payer: Medicare HMO | Admitting: Family Medicine

## 2021-04-02 ENCOUNTER — Other Ambulatory Visit: Payer: Self-pay

## 2021-04-02 VITALS — BP 110/72 | HR 80 | Temp 98.3°F | Resp 18 | Wt 213.0 lb

## 2021-04-02 DIAGNOSIS — E038 Other specified hypothyroidism: Secondary | ICD-10-CM | POA: Diagnosis not present

## 2021-04-02 DIAGNOSIS — M25561 Pain in right knee: Secondary | ICD-10-CM

## 2021-04-02 DIAGNOSIS — M25562 Pain in left knee: Secondary | ICD-10-CM

## 2021-04-02 DIAGNOSIS — R413 Other amnesia: Secondary | ICD-10-CM

## 2021-04-02 DIAGNOSIS — G720 Drug-induced myopathy: Secondary | ICD-10-CM

## 2021-04-02 DIAGNOSIS — M791 Myalgia, unspecified site: Secondary | ICD-10-CM

## 2021-04-02 DIAGNOSIS — R0789 Other chest pain: Secondary | ICD-10-CM | POA: Diagnosis not present

## 2021-04-02 DIAGNOSIS — M79644 Pain in right finger(s): Secondary | ICD-10-CM

## 2021-04-02 DIAGNOSIS — R0602 Shortness of breath: Secondary | ICD-10-CM

## 2021-04-02 DIAGNOSIS — E782 Mixed hyperlipidemia: Secondary | ICD-10-CM | POA: Diagnosis not present

## 2021-04-02 DIAGNOSIS — R29898 Other symptoms and signs involving the musculoskeletal system: Secondary | ICD-10-CM | POA: Diagnosis not present

## 2021-04-02 DIAGNOSIS — T466X5A Adverse effect of antihyperlipidemic and antiarteriosclerotic drugs, initial encounter: Secondary | ICD-10-CM

## 2021-04-02 DIAGNOSIS — I25118 Atherosclerotic heart disease of native coronary artery with other forms of angina pectoris: Secondary | ICD-10-CM

## 2021-04-02 LAB — CBC WITH DIFFERENTIAL/PLATELET
Basophils Absolute: 0 10*3/uL (ref 0.0–0.2)
Basos: 0 %
EOS (ABSOLUTE): 0 10*3/uL (ref 0.0–0.4)
Eos: 0 %
Hematocrit: 38.7 % (ref 37.5–51.0)
Hemoglobin: 13.3 g/dL (ref 13.0–17.7)
Lymphocytes Absolute: 1.2 10*3/uL (ref 0.7–3.1)
Lymphs: 11 %
MCH: 30.9 pg (ref 26.6–33.0)
MCHC: 34.4 g/dL (ref 31.5–35.7)
MCV: 90 fL (ref 79–97)
Monocytes Absolute: 1.2 10*3/uL — ABNORMAL HIGH (ref 0.1–0.9)
Monocytes: 12 %
Neutrophils Absolute: 8.1 10*3/uL — ABNORMAL HIGH (ref 1.4–7.0)
Neutrophils: 77 %
Platelets: 270 10*3/uL (ref 150–450)
RBC: 4.3 x10E6/uL (ref 4.14–5.80)
RDW: 13.4 % (ref 11.6–15.4)
WBC: 10.6 10*3/uL (ref 3.4–10.8)

## 2021-04-02 LAB — TROPONIN T: Troponin T (Highly Sensitive): 11 ng/L (ref 0–22)

## 2021-04-02 LAB — COMPREHENSIVE METABOLIC PANEL
ALT: 13 IU/L (ref 0–44)
AST: 14 IU/L (ref 0–40)
Albumin/Globulin Ratio: 1.9 (ref 1.2–2.2)
Albumin: 4.2 g/dL (ref 3.7–4.7)
Alkaline Phosphatase: 52 IU/L (ref 44–121)
BUN/Creatinine Ratio: 23 (ref 10–24)
BUN: 19 mg/dL (ref 8–27)
Bilirubin Total: 0.5 mg/dL (ref 0.0–1.2)
CO2: 24 mmol/L (ref 20–29)
Calcium: 8.9 mg/dL (ref 8.6–10.2)
Chloride: 101 mmol/L (ref 96–106)
Creatinine, Ser: 0.82 mg/dL (ref 0.76–1.27)
Globulin, Total: 2.2 g/dL (ref 1.5–4.5)
Glucose: 92 mg/dL (ref 70–99)
Potassium: 4.6 mmol/L (ref 3.5–5.2)
Sodium: 136 mmol/L (ref 134–144)
Total Protein: 6.4 g/dL (ref 6.0–8.5)
eGFR: 89 mL/min/{1.73_m2} (ref >59)

## 2021-04-02 MED ORDER — PREDNISONE 50 MG PO TABS: 50.0000 mg | ORAL_TABLET | Freq: Every day | ORAL | 0 refills | Status: DC

## 2021-04-02 NOTE — Progress Notes (Signed)
Acute Office Visit  Subjective:    Patient ID: Jeffery Turner, male    DOB: 05-23-1941, 80 y.o.   MRN: [id number]  Chief Complaint  Patient presents with   Leg Pain    HPI: Patient is in today for sudden onset of BL knee/leg pain in the middle of the night.  Rt thumb painful and swollen still. Lt thumb hurt in the middle of the night, but has improved.    Chest pain: mid sternum. Sore in the middle of the night. Felt like everything was hurting all over.  Lasted a few minutes. Known history of CAD.   Shortness of breath is baseline, but has increased in exertion.. No nausea, vomiting, diaphoresis with chest pain.   Past Medical History:  Diagnosis Date   Anemia    Angina pectoris ([place]) 02/06/2018   Angina, class III ([place]) 02/06/2018   Anginal equivalent ([place]) 09/19/2019   CAD (coronary artery disease)    s/p stent to CFX 1997 2/2 MI s/p DES to [facility] and DES mLAD 07/14/10 2/2 [place] cath 07/14/10: dLAD 60%; CFX stent ok; OM1 30-40%; pRCA 30%; PDA 40%; EF 60%   Constant exophthalmos    right   Coronary artery disease of native artery of native heart with stable angina pectoris ([place]) 07/21/2010   Qualifier: Diagnosis of  By: Denny Peon, CMA, [name]     Epistaxis 07/21/2010   Qualifier: Diagnosis of  By: Jorene Minors, Scott     Hearing loss    right eye - no hearing aids   Hyperlipidemia    MI (myocardial infarction) ([place])    Mixed hyperlipidemia 07/21/2010   Qualifier: Diagnosis of  By: Orville Govern, CMA, Carol     Old myocardial infarction 07/21/2010   Qualifier: Diagnosis of  By: Orville Govern CMA, Carol     Osteoarthritis    OSTEOARTHRITIS 07/21/2010   Qualifier: Diagnosis of  By: Orville Govern CMA, Carol     S/P CABG x 3 02/05/2020   Shortness of breath 07/21/2010   Qualifier: Diagnosis of  By: Jorene Minors, Scott     Thyroid disease    TOBACCO USE, QUIT 07/21/2010   Qualifier: Diagnosis of  By: Orville Govern, CMA, Carol     Transient visual loss, right    wears glasses   UNSTABLE ANGINA 07/21/2010   Qualifier:  Diagnosis of  By: Ronne Binning      Past Surgical History:  Procedure Laterality Date   CARDIAC CATHETERIZATION     CORONARY ARTERY BYPASS GRAFT N/A 02/05/2020   Procedure: CORONARY ARTERY BYPASS GRAFTING (CABG), ON PUMP, TIMES THREE, USING LEFT INTERNAL MAMMARY ARTERY TO LEFT ANTERIOR DESCENDING ARTERY, REVERSE SAPHENOUS VEIN GRAFT TO POSTERIOR DESCENDING ARTERY AND CIRCUMFLEX ARTERY;  Surgeon: Grace Isaac, MD;  Location: [place];  Service: Open Heart Surgery;  Laterality: N/A;   ENDOVEIN HARVEST OF GREATER SAPHENOUS VEIN Right 02/05/2020   Procedure: ENDOVEIN HARVEST OF GREATER SAPHENOUS VEIN;  Surgeon: Grace Isaac, MD;  Location: [place];  Service: Open Heart Surgery;  Laterality: Right;   LEFT HEART CATH AND CORONARY ANGIOGRAPHY N/A 01/25/2020   Procedure: LEFT HEART CATH AND CORONARY ANGIOGRAPHY;  Surgeon: Leonie Man, MD;  Location: [facility];  Service: Cardiovascular;  Laterality: N/A;   TEE WITHOUT CARDIOVERSION N/A 02/05/2020   Procedure: TRANSESOPHAGEAL ECHOCARDIOGRAM (TEE);  Surgeon: Grace Isaac, MD;  Location: [facility];  Service: Open Heart Surgery;  Laterality: N/A;   TONSILLECTOMY AND ADENOIDECTOMY     WISDOM TOOTH EXTRACTION  Family History  Problem Relation Age of Onset   Coronary artery disease Other    Heart failure Mother    Alzheimer's disease Mother     Social History   Socioeconomic History   Marital status: Married    Spouse name: Not on file   Number of children: Not on file   Years of education: Not on file   Highest education level: Not on file  Occupational History   Not on file  Tobacco Use   Smoking status: Former    Packs/day: 1.50    Years: 37.00    Pack years: 55.50    Types: Cigarettes    Quit date: 05/2000    Years since quitting: 20.9   Smokeless tobacco: Never  Vaping Use   Vaping Use: Never used  Substance and Sexual Activity   Alcohol use: No   Drug use: No   Sexual activity: Not Currently  Other  Topics Concern   Not on file  Social History Narrative   He lives in [place] with family.  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         Social Determinants of Health   Financial Resource Strain: Not on file  Food Insecurity: Not on file  Transportation Needs: Not on file  Physical Activity: Sufficiently Active   Days of Exercise per Week: 5 days   Minutes of Exercise per Session: 30 min  Stress: Stress Concern Present   Feeling of Stress : To some extent  Social Connections: Unknown   Frequency of Communication with Friends and Family: Not on file   Frequency of Social Gatherings with Friends and Family: Not on file   Attends Religious Services: Not on Electrical engineer or Organizations: Not on file   Attends Archivist Meetings: Not on file   Marital Status: Married  Human resources officer Violence: Not on file    Outpatient Medications Prior to Visit  Medication Sig Dispense Refill   aspirin EC 81 MG tablet Take 1 tablet (81 mg total) by mouth daily. Swallow whole. 90 tablet 3   Calcium-Magnesium-Zinc (CAL-MAG-ZINC PO) Take 1 tablet by mouth daily.     COCONUT OIL PO Take 30 mLs by mouth daily.     ketoconazole (NIZORAL) 2 % cream Apply 1 application topically 2 (two) times daily. 15 g 0   Multiple Vitamin (MULTIVITAMIN WITH MINERALS) TABS tablet Take 1 tablet by mouth daily.     nitroGLYCERIN (NITROSTAT) 0.4 MG SL tablet Place 1 tablet (0.4 mg total) under the tongue every 5 (five) minutes as needed. If require 2, recommend call 911. 50 tablet 0   triamcinolone (KENALOG) 0.025 % ointment Apply 1 application topically 2 (two) times daily. 30 g 0   No facility-administered medications prior to visit.    Allergies  Allergen Reactions   Bactrim [Sulfamethoxazole-Trimethoprim] Shortness Of Breath    Breathing problems.  Rt ear - hearing loss.    Codeine  Nausea And Vomiting   Statins Other (See Comments)    Patient preference to not take statins/cholesterol meds    Review of Systems  Constitutional:  Negative for chills, fatigue and fever.  HENT:  Negative for congestion, ear pain and sore throat.   Respiratory:  Positive for shortness of breath. Negative for cough.   Cardiovascular:  Positive for chest pain.  Gastrointestinal:  Negative for abdominal pain, constipation, diarrhea, nausea and vomiting.  Musculoskeletal:  Positive for arthralgias (bilateral knee pain and leg pain).       Bilateral thumb pain   Neurological:  Positive for weakness.  Psychiatric/Behavioral:  The patient is not nervous/anxious.       Objective:    Physical Exam Vitals reviewed.  Constitutional:      Appearance: Normal appearance.  Neck:     Vascular: No carotid bruit.  Cardiovascular:     Rate and Rhythm: Normal rate and regular rhythm.     Pulses: Normal pulses.     Heart sounds: Normal heart sounds.  Pulmonary:     Effort: Pulmonary effort is normal.     Breath sounds: Normal breath sounds. No wheezing, rhonchi or rales.  Abdominal:     General: Bowel sounds are normal.     Palpations: Abdomen is soft.     Tenderness: There is no abdominal tenderness.  Musculoskeletal:        General: Tenderness (right thumb: erythema, warm, tender. left thumb nontender, nonerythema, no increased warmth. Knees BL tenderness, but more his BL legs.) present.  Neurological:     Mental Status: He is alert.     Cranial Nerves: Cranial nerves 2-12 are intact.     Motor: Weakness present. No tremor, abnormal muscle tone or pronator drift.     Gait: Gait abnormal (legs weakened. Using hands to hold on to furniture/exam table to get to wheelchair. He was unable to walk in which he normally does).     Deep Tendon Reflexes:     Reflex Scores:      Patellar reflexes are 2+ on the right side and 2+ on the left side. Psychiatric:        Mood and Affect: Mood normal.         Behavior: Behavior normal.    There were no vitals taken for this visit. Wt Readings from Last 3 Encounters:  04/10/21 213 lb (96.6 kg)  03/13/21 215 lb (97.5 kg)  03/06/21 214 lb (97.1 kg)    Health Maintenance Due  Topic Date Due   Pneumonia Vaccine 51+ Years old (1 - PCV) Never done   Hepatitis C Screening  Never done   Zoster Vaccines- Shingrix (1 of 2) Never done   COVID-19 Vaccine (3 - Booster for Janssen series) 04/22/2020    There are no preventive care reminders to display for this patient.   Lab Results  Component Value Date   TSH 5.280 (H) 04/02/2021   Lab Results  Component Value Date   WBC 10.6 04/02/2021   HGB 13.3 04/02/2021   HCT 38.7 04/02/2021   MCV 90 04/02/2021   PLT 270 04/02/2021   Lab Results  Component Value Date   NA 136 04/02/2021   K 4.6 04/02/2021   CO2 24 04/02/2021   GLUCOSE 92 04/02/2021   BUN 19 04/02/2021   CREATININE 0.82 04/02/2021   BILITOT 0.5 04/02/2021   ALKPHOS 52 04/02/2021   AST 14 04/02/2021   ALT 13 04/02/2021   PROT 6.4 04/02/2021   ALBUMIN 4.2 04/02/2021   CALCIUM 8.9 04/02/2021   ANIONGAP 11 02/10/2020   EGFR 89 04/02/2021   Lab Results  Component Value Date   CHOL 167 04/02/2021   Lab Results  Component Value Date   HDL 36 (L) 04/02/2021   Lab Results  Component Value Date   LDLCALC 118 (H) 04/02/2021   Lab Results  Component Value Date  TRIG 66 04/02/2021   Lab Results  Component Value Date   CHOLHDL 4.6 04/02/2021   Lab Results  Component Value Date   HGBA1C 6.2 (H) 07/23/2020       Assessment & Plan:   Problem List Items Addressed This Visit       Cardiovascular and Mediastinum   CAD (coronary artery disease)    Continue aspirin 81 mg once daily NTG SL.  Intolerant to Statins.       Relevant Orders   Lipid panel (Completed)   EKG 12-Lead (Completed)     Endocrine   Secondary hypothyroidism    Check tsh. Has been off medicine for months.       Relevant Orders    TSH (Completed)     Nervous and Auditory   Weakness of both lower extremities - Primary    Check labs.  Had pt go to hospital for esr.       Relevant Orders   CBC with Differential/Platelet (Completed)   Comprehensive metabolic panel (Completed)     Musculoskeletal and Integument   Statin myopathy    Intolerant to statin.        Other   Mixed hyperlipidemia    Intolerant to statins.      Pain of right thumb    Check uric acid level.  Start prednisone.      Relevant Orders   Uric acid (Completed)   Acute pain of left knee    Order left knee xray.  likley OA.      Acute pain of right knee    Rt knee xray. Likely has OA      Other chest pain    EKG NSR. Check Troponin.      Relevant Orders   Troponin T (Completed)   Memory loss    Check B12, folate, mma      Relevant Orders   B12 and Folate Panel (Completed)   Methylmalonic acid, serum (Completed)   RESOLVED: Shortness of breath   Relevant Orders   Pro b natriuretic peptide (Completed)   Meds ordered this encounter  Medications   DISCONTD: predniSONE (DELTASONE) 50 MG tablet    Sig: Take 1 tablet (50 mg total) by mouth daily with breakfast.    Dispense:  5 tablet    Refill:  0    Orders Placed This Encounter  Procedures   Uric acid   TSH   B12 and Folate Panel   Lipid panel   CBC with Differential/Platelet   Comprehensive metabolic panel   Troponin T   Methylmalonic acid, serum   Pro b natriuretic peptide   POC COVID-19 BinaxNow   Influenza A/B   EKG 12-Lead     Follow-up: Return in about 2 weeks (around 04/16/2021).  An After Visit Summary was printed and given to the patient.  Rochel Brome, MD [facility] [phone]

## 2021-04-02 NOTE — Patient Instructions (Signed)
Start on prednisone 50 mg once daily with food x 5 days.  May use tylenol use directions on bottle given.

## 2021-04-03 ENCOUNTER — Other Ambulatory Visit: Payer: Self-pay

## 2021-04-03 DIAGNOSIS — M25461 Effusion, right knee: Secondary | ICD-10-CM | POA: Diagnosis not present

## 2021-04-03 DIAGNOSIS — M25462 Effusion, left knee: Secondary | ICD-10-CM | POA: Diagnosis not present

## 2021-04-03 DIAGNOSIS — M353 Polymyalgia rheumatica: Secondary | ICD-10-CM

## 2021-04-03 DIAGNOSIS — R29898 Other symptoms and signs involving the musculoskeletal system: Secondary | ICD-10-CM | POA: Diagnosis not present

## 2021-04-03 DIAGNOSIS — M25561 Pain in right knee: Secondary | ICD-10-CM | POA: Diagnosis not present

## 2021-04-03 DIAGNOSIS — M25562 Pain in left knee: Secondary | ICD-10-CM | POA: Diagnosis not present

## 2021-04-03 HISTORY — DX: Polymyalgia rheumatica: M35.3

## 2021-04-03 LAB — TSH: TSH: 5.28 u[IU]/mL — ABNORMAL HIGH (ref 0.450–4.500)

## 2021-04-03 LAB — B12 AND FOLATE PANEL
Folate: 10.6 ng/mL (ref >3.0)
Vitamin B-12: 267 pg/mL (ref 232–1245)

## 2021-04-03 LAB — LIPID PANEL
Chol/HDL Ratio: 4.6 ratio (ref 0.0–5.0)
Cholesterol, Total: 167 mg/dL (ref 100–199)
HDL: 36 mg/dL — ABNORMAL LOW (ref >39)
LDL Chol Calc (NIH): 118 mg/dL — ABNORMAL HIGH (ref 0–99)
Triglycerides: 66 mg/dL (ref 0–149)
VLDL Cholesterol Cal: 13 mg/dL (ref 5–40)

## 2021-04-03 LAB — URIC ACID: Uric Acid: 4.6 mg/dL (ref 3.8–8.4)

## 2021-04-03 LAB — POCT ERYTHROCYTE SEDIMENTATION RATE, NON-AUTOMATED: Sed Rate: 73

## 2021-04-03 LAB — PRO B NATRIURETIC PEPTIDE: NT-Pro BNP: 169 pg/mL (ref 0–486)

## 2021-04-03 MED ORDER — LEVOTHYROXINE SODIUM 25 MCG PO TABS: 25.0000 ug | ORAL_TABLET | Freq: Every day | ORAL | 1 refills | Status: DC

## 2021-04-03 NOTE — Progress Notes (Signed)
Blood count normal.  Liver function normal.  Kidney function normal.  Thyroid function abnormal.  Start on Synthroid 25 mcg once daily. Cholesterol: LDL is much too high for someone with heart disease.  Recommend Repatha 140 mg every 2 weeks.   proBNP normal Troponin normal Uric acid normal. Patient sent to the hospital for bilateral knee x-rays and a sedimentation rate stat.  B12 and folate normal All this was discussed with the patient and he agreed.

## 2021-04-04 DIAGNOSIS — M791 Myalgia, unspecified site: Secondary | ICD-10-CM | POA: Insufficient documentation

## 2021-04-04 DIAGNOSIS — F0280 Dementia in other diseases classified elsewhere without behavioral disturbance: Secondary | ICD-10-CM | POA: Insufficient documentation

## 2021-04-04 DIAGNOSIS — M25562 Pain in left knee: Secondary | ICD-10-CM | POA: Insufficient documentation

## 2021-04-04 DIAGNOSIS — R0789 Other chest pain: Secondary | ICD-10-CM | POA: Insufficient documentation

## 2021-04-04 DIAGNOSIS — M79644 Pain in right finger(s): Secondary | ICD-10-CM | POA: Insufficient documentation

## 2021-04-04 DIAGNOSIS — R29898 Other symptoms and signs involving the musculoskeletal system: Secondary | ICD-10-CM

## 2021-04-04 DIAGNOSIS — G720 Drug-induced myopathy: Secondary | ICD-10-CM | POA: Insufficient documentation

## 2021-04-04 DIAGNOSIS — R413 Other amnesia: Secondary | ICD-10-CM | POA: Insufficient documentation

## 2021-04-04 DIAGNOSIS — M25561 Pain in right knee: Secondary | ICD-10-CM | POA: Insufficient documentation

## 2021-04-04 HISTORY — DX: Drug-induced myopathy: G72.0

## 2021-04-04 HISTORY — DX: Other symptoms and signs involving the musculoskeletal system: R29.898

## 2021-04-07 LAB — METHYLMALONIC ACID, SERUM: Methylmalonic Acid: 186 nmol/L (ref 0–378)

## 2021-04-09 ENCOUNTER — Encounter: Payer: Self-pay | Admitting: Family Medicine

## 2021-04-09 DIAGNOSIS — H353113 Nonexudative age-related macular degeneration, right eye, advanced atrophic without subfoveal involvement: Secondary | ICD-10-CM | POA: Diagnosis not present

## 2021-04-09 DIAGNOSIS — H353122 Nonexudative age-related macular degeneration, left eye, intermediate dry stage: Secondary | ICD-10-CM | POA: Diagnosis not present

## 2021-04-10 ENCOUNTER — Other Ambulatory Visit: Payer: Self-pay

## 2021-04-10 ENCOUNTER — Encounter: Payer: Self-pay | Admitting: Family Medicine

## 2021-04-10 ENCOUNTER — Ambulatory Visit (INDEPENDENT_AMBULATORY_CARE_PROVIDER_SITE_OTHER): Payer: Medicare HMO | Admitting: Family Medicine

## 2021-04-10 VITALS — BP 108/68 | HR 84 | Temp 97.6°F | Resp 18 | Ht 67.0 in | Wt 213.0 lb

## 2021-04-10 DIAGNOSIS — M353 Polymyalgia rheumatica: Secondary | ICD-10-CM

## 2021-04-10 MED ORDER — PREDNISONE 20 MG PO TABS: 20.0000 mg | ORAL_TABLET | Freq: Every day | ORAL | 0 refills | Status: DC

## 2021-04-10 NOTE — Patient Instructions (Signed)
Prednisone 20 mg once dailiy. With food. In am.

## 2021-04-10 NOTE — Progress Notes (Signed)
Subjective:  Patient ID: Jeffery Turner, male    DOB: 1940-08-10  Age: 80 y.o. MRN: [id number]  Chief Complaint  Patient presents with   Extremity Weakness   HPI: PMR: took prednisone 50 mg once daily x 5 days. Felt enormously better. Fatigue resolved. Weakness resolved.  Denies headaches.  Current Outpatient Medications on File Prior to Visit  Medication Sig Dispense Refill   aspirin EC 81 MG tablet Take 1 tablet (81 mg total) by mouth daily. Swallow whole. 90 tablet 3   Calcium-Magnesium-Zinc (CAL-MAG-ZINC PO) Take 1 tablet by mouth daily.     COCONUT OIL PO Take 30 mLs by mouth daily.     ketoconazole (NIZORAL) 2 % cream Apply 1 application topically 2 (two) times daily. 15 g 0   levothyroxine (SYNTHROID) 25 MCG tablet Take 1 tablet (25 mcg total) by mouth daily before breakfast. 30 tablet 1   Multiple Vitamin (MULTIVITAMIN WITH MINERALS) TABS tablet Take 1 tablet by mouth daily.     nitroGLYCERIN (NITROSTAT) 0.4 MG SL tablet Place 1 tablet (0.4 mg total) under the tongue every 5 (five) minutes as needed. If require 2, recommend call 911. 50 tablet 0   triamcinolone (KENALOG) 0.025 % ointment Apply 1 application topically 2 (two) times daily. 30 g 0   No current facility-administered medications on file prior to visit.   Past Medical History:  Diagnosis Date   Anemia    Angina pectoris ([place]) 02/06/2018   Angina, class III ([place]) 02/06/2018   Anginal equivalent ([place]) 09/19/2019   CAD (coronary artery disease)    s/p stent to CFX 1997 2/2 MI s/p DES to [facility] and DES mLAD 07/14/10 2/2 [place] cath 07/14/10: dLAD 60%; CFX stent ok; OM1 30-40%; pRCA 30%; PDA 40%; EF 60%   Constant exophthalmos    right   Coronary artery disease of native artery of native heart with stable angina pectoris ([place]) 07/21/2010   Qualifier: Diagnosis of  By: Denny Peon, CMA, [name]     Epistaxis 07/21/2010   Qualifier: Diagnosis of  By: Jorene Minors, Scott     Hearing loss    right eye - no hearing aids   Hyperlipidemia     MI (myocardial infarction) ([place])    Mixed hyperlipidemia 07/21/2010   Qualifier: Diagnosis of  By: Orville Govern, CMA, Carol     Old myocardial infarction 07/21/2010   Qualifier: Diagnosis of  By: Orville Govern CMA, Carol     Osteoarthritis    OSTEOARTHRITIS 07/21/2010   Qualifier: Diagnosis of  By: Orville Govern CMA, Carol     S/P CABG x 3 02/05/2020   Shortness of breath 07/21/2010   Qualifier: Diagnosis of  By: Jorene Minors, Scott     Thyroid disease    TOBACCO USE, QUIT 07/21/2010   Qualifier: Diagnosis of  By: Orville Govern, CMA, Carol     Transient visual loss, right    wears glasses   UNSTABLE ANGINA 07/21/2010   Qualifier: Diagnosis of  By: Ronne Binning     Past Surgical History:  Procedure Laterality Date   CARDIAC CATHETERIZATION     CORONARY ARTERY BYPASS GRAFT N/A 02/05/2020   Procedure: CORONARY ARTERY BYPASS GRAFTING (CABG), ON PUMP, TIMES THREE, USING LEFT INTERNAL MAMMARY ARTERY TO LEFT ANTERIOR DESCENDING ARTERY, REVERSE SAPHENOUS VEIN GRAFT TO POSTERIOR DESCENDING ARTERY AND CIRCUMFLEX ARTERY;  Surgeon: Grace Isaac, MD;  Location: [facility];  Service: Open Heart Surgery;  Laterality: N/A;   ENDOVEIN HARVEST OF GREATER SAPHENOUS VEIN Right 02/05/2020   Procedure:  ENDOVEIN HARVEST OF GREATER SAPHENOUS VEIN;  Surgeon: Grace Isaac, MD;  Location: [facility];  Service: Open Heart Surgery;  Laterality: Right;   LEFT HEART CATH AND CORONARY ANGIOGRAPHY N/A 01/25/2020   Procedure: LEFT HEART CATH AND CORONARY ANGIOGRAPHY;  Surgeon: Leonie Man, MD;  Location: [facility];  Service: Cardiovascular;  Laterality: N/A;   TEE WITHOUT CARDIOVERSION N/A 02/05/2020   Procedure: TRANSESOPHAGEAL ECHOCARDIOGRAM (TEE);  Surgeon: Grace Isaac, MD;  Location: [facility];  Service: Open Heart Surgery;  Laterality: N/A;   TONSILLECTOMY AND ADENOIDECTOMY     WISDOM TOOTH EXTRACTION      Family History  Problem Relation Age of Onset   Coronary artery disease Other    Heart failure Mother    Alzheimer's  disease Mother    Social History   Socioeconomic History   Marital status: Married    Spouse name: Not on file   Number of children: Not on file   Years of education: Not on file   Highest education level: Not on file  Occupational History   Not on file  Tobacco Use   Smoking status: Former    Packs/day: 1.50    Years: 37.00    Pack years: 55.50    Types: Cigarettes    Quit date: 05/2000    Years since quitting: 20.9   Smokeless tobacco: Never  Vaping Use   Vaping Use: Never used  Substance and Sexual Activity   Alcohol use: No   Drug use: No   Sexual activity: Not Currently  Other Topics Concern   Not on file  Social History Narrative   He lives in [place] with family.  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         Social Determinants of Health   Financial Resource Strain: Not on file  Food Insecurity: Not on file  Transportation Needs: Not on file  Physical Activity: Sufficiently Active   Days of Exercise per Week: 5 days   Minutes of Exercise per Session: 30 min  Stress: Stress Concern Present   Feeling of Stress : To some extent  Social Connections: Unknown   Frequency of Communication with Friends and Family: Not on file   Frequency of Social Gatherings with Friends and Family: Not on file   Attends Religious Services: Not on file   Active Member of Clubs or Organizations: Not on file   Attends Archivist Meetings: Not on file   Marital Status: Married    Review of Systems  Constitutional:  Negative for fatigue and fever.  HENT:  Negative for congestion.   Respiratory:  Positive for shortness of breath.   Cardiovascular:  Positive for palpitations. Negative for chest pain.  Gastrointestinal:  Negative for abdominal pain, nausea and vomiting.  Genitourinary:  Negative for dysuria.  Musculoskeletal:  Positive for arthralgias (bilateral knee  pain).  Neurological:  Negative for headaches.    Objective:  BP 108/68   Pulse 84   Temp 97.6 F (36.4 C)   Resp 18   Ht 5\' 7"  (1.702 m)   Wt 213 lb (96.6 kg)   BMI 33.36 kg/m   BP/Weight 04/10/2021 04/02/2021 5/00/9381  Systolic BP 829 937 169  Diastolic BP 68 72 64  Wt. (Lbs) 213 213 215  BMI 33.36 33.36 33.67    Physical Exam Vitals  reviewed.  Constitutional:      Appearance: Normal appearance.  Cardiovascular:     Rate and Rhythm: Normal rate and regular rhythm.     Heart sounds: Normal heart sounds.  Pulmonary:     Effort: Pulmonary effort is normal.     Breath sounds: Normal breath sounds. No wheezing, rhonchi or rales.  Abdominal:     General: Bowel sounds are normal.     Palpations: Abdomen is soft.     Tenderness: There is no abdominal tenderness.  Musculoskeletal:        General: Normal range of motion.  Neurological:     Mental Status: He is alert.     Gait: Gait normal.  Psychiatric:        Mood and Affect: Mood normal.        Behavior: Behavior normal.    Diabetic Foot Exam - Simple   No data filed      Lab Results  Component Value Date   WBC 10.6 04/02/2021   HGB 13.3 04/02/2021   HCT 38.7 04/02/2021   PLT 270 04/02/2021   GLUCOSE 92 04/02/2021   CHOL 167 04/02/2021   TRIG 66 04/02/2021   HDL 36 (L) 04/02/2021   LDLDIRECT 152.5 10/05/2010   LDLCALC 118 (H) 04/02/2021   ALT 13 04/02/2021   AST 14 04/02/2021   NA 136 04/02/2021   K 4.6 04/02/2021   CL 101 04/02/2021   CREATININE 0.82 04/02/2021   BUN 19 04/02/2021   CO2 24 04/02/2021   TSH 5.280 (H) 04/02/2021   INR 1.4 (H) 02/05/2020   HGBA1C 6.2 (H) 07/23/2020      Assessment & Plan:   Problem List Items Addressed This Visit   None Visit Diagnoses     PMR (polymyalgia rheumatica) (H[facility])    -  Primary   Relevant Orders   Sedimentation rate (Completed)     .  Meds ordered this encounter  Medications   predniSONE (DELTASONE) 20 MG tablet    Sig: Take 1 tablet (20  mg total) by mouth daily with breakfast.    Dispense:  30 tablet    Refill:  0    Orders Placed This Encounter  Procedures   Sedimentation rate     Follow-up: Return in about 1 month (around 05/11/2021) for chronic follow up.  An After Visit Summary was printed and given to the patient.  Rochel Brome, MD [facility] [phone]

## 2021-04-11 ENCOUNTER — Encounter: Payer: Self-pay | Admitting: Family Medicine

## 2021-04-11 DIAGNOSIS — M353 Polymyalgia rheumatica: Secondary | ICD-10-CM | POA: Insufficient documentation

## 2021-04-11 LAB — POCT INFLUENZA A/B
Influenza A, POC: NEGATIVE
Influenza B, POC: NEGATIVE

## 2021-04-11 LAB — SEDIMENTATION RATE: Sed Rate: 12 mm/hr (ref 0–30)

## 2021-04-11 LAB — POC COVID19 BINAXNOW: SARS Coronavirus 2 Ag: NEGATIVE

## 2021-04-11 NOTE — Assessment & Plan Note (Signed)
Check tsh. Has been off medicine for months.

## 2021-04-11 NOTE — Assessment & Plan Note (Signed)
Rt knee xray. Likely has OA

## 2021-04-11 NOTE — Assessment & Plan Note (Signed)
Hugely improved.  Check ESR.  Start on prednisone 20 mg once daily.

## 2021-04-11 NOTE — Assessment & Plan Note (Signed)
Check uric acid level.  Start prednisone.

## 2021-04-11 NOTE — Assessment & Plan Note (Signed)
Continue aspirin 81 mg once daily NTG SL.  Intolerant to Statins.

## 2021-04-11 NOTE — Assessment & Plan Note (Addendum)
EKG NSR. Check Troponin.

## 2021-04-11 NOTE — Assessment & Plan Note (Signed)
Check B12, folate, mma

## 2021-04-11 NOTE — Assessment & Plan Note (Signed)
>>  ASSESSMENT AND PLAN FOR CAD (CORONARY ARTERY DISEASE) WRITTEN ON 04/11/2021  8:41 PM BY COX, KIRSTEN, MD  Continue aspirin 81 mg once daily NTG SL.  Intolerant to Statins.

## 2021-04-11 NOTE — Assessment & Plan Note (Signed)
Intolerant to statin.  ?

## 2021-04-11 NOTE — Assessment & Plan Note (Signed)
Check labs.  Had pt go to hospital for esr.

## 2021-04-11 NOTE — Assessment & Plan Note (Signed)
Order left knee xray.  likley OA.

## 2021-04-11 NOTE — Assessment & Plan Note (Signed)
Intolerant to statins. 

## 2021-04-23 DIAGNOSIS — H353113 Nonexudative age-related macular degeneration, right eye, advanced atrophic without subfoveal involvement: Secondary | ICD-10-CM | POA: Diagnosis not present

## 2021-04-23 DIAGNOSIS — H353122 Nonexudative age-related macular degeneration, left eye, intermediate dry stage: Secondary | ICD-10-CM | POA: Diagnosis not present

## 2021-05-11 ENCOUNTER — Other Ambulatory Visit: Payer: Self-pay

## 2021-05-11 MED ORDER — PREDNISONE 5 MG PO TABS: 15.0000 mg | ORAL_TABLET | Freq: Every day | ORAL | 0 refills | Status: DC

## 2021-05-14 ENCOUNTER — Ambulatory Visit (INDEPENDENT_AMBULATORY_CARE_PROVIDER_SITE_OTHER): Payer: Medicare HMO | Admitting: Family Medicine

## 2021-05-14 ENCOUNTER — Encounter: Payer: Self-pay | Admitting: Family Medicine

## 2021-05-14 DIAGNOSIS — J069 Acute upper respiratory infection, unspecified: Secondary | ICD-10-CM | POA: Diagnosis not present

## 2021-05-14 DIAGNOSIS — U071 COVID-19: Secondary | ICD-10-CM

## 2021-05-14 DIAGNOSIS — M353 Polymyalgia rheumatica: Secondary | ICD-10-CM

## 2021-05-14 HISTORY — DX: Acute upper respiratory infection, unspecified: J06.9

## 2021-05-14 HISTORY — DX: COVID-19: U07.1

## 2021-05-14 LAB — POCT INFLUENZA A/B
Influenza A, POC: NEGATIVE
Influenza B, POC: NEGATIVE

## 2021-05-14 LAB — POC COVID19 BINAXNOW: SARS Coronavirus 2 Ag: POSITIVE — AB

## 2021-05-14 MED ORDER — MOLNUPIRAVIR EUA 200MG CAPSULE: 4.0000 | ORAL_CAPSULE | Freq: Two times a day (BID) | ORAL | 0 refills | Status: AC

## 2021-05-14 NOTE — Assessment & Plan Note (Signed)
Molnupiravir was sent to pharmacy. Recommend Tylenol for fevers or achiness. Recommend OTC cough medicine.  Your COVID test is positive. You should remain isolated and quarantine for at least 5 days from start of symptoms. You must be feeling better and be fever free without any fever reducers for at least 24 hours as well. You should wear a mask at all times when out of your home or around others for 5 days after leaving isolation.  Your household contacts should be tested as well as work contacts. If you feel worse or have increasing shortness of breath, you should be seen in person at urgent care or the emergency room.

## 2021-05-14 NOTE — Progress Notes (Signed)
Acute Office Visit  Subjective:    Patient ID: Jeffery Turner, male    DOB: [birth year], 80 y.o.   MRN: [id number]  Chief Complaint  Patient presents with   Covid Exposure    HPI: Patient was scheduled for chronic follow-up related to PMR which I diagnosed 1 month ago.  The patient's prednisone was decreased earlier this week to 15 mg daily.  Can she just started this yesterday.  Patient reports his wife is tested positive for COVID she has been sick for 4 to 5 days.  He began having cough and some shortness of breath with some burning in his chest which began this morning.  He denies fever, fatigue, earaches, sore throat, headaches, nausea.  He is here with his wife and we tested him as well and he is positive for COVID.  He has had 2 COVID vaccinations.  He is negative for the flu.  Past Medical History:  Diagnosis Date   Anemia    Angina pectoris ([place]) 02/06/2018   Angina, class III ([place]) 02/06/2018   Anginal equivalent ([place]) 09/19/2019   CAD (coronary artery disease)    s/p stent to CFX 1997 2/2 MI s/p DES to [facility] and DES mLAD 07/14/10 2/2 [place] cath 07/14/10: dLAD 60%; CFX stent ok; OM1 30-40%; pRCA 30%; PDA 40%; EF 60%   Constant exophthalmos    right   Coronary artery disease of native artery of native heart with stable angina pectoris ([place]) 07/21/2010   Qualifier: Diagnosis of  By: Denny Peon, CMA, [name]     Epistaxis 07/21/2010   Qualifier: Diagnosis of  By: Jorene Minors, Scott     Hearing loss    right eye - no hearing aids   Hyperlipidemia    MI (myocardial infarction) ([place])    Mixed hyperlipidemia 07/21/2010   Qualifier: Diagnosis of  By: Orville Govern, CMA, Carol     Old myocardial infarction 07/21/2010   Qualifier: Diagnosis of  By: Orville Govern CMA, Carol     Osteoarthritis    OSTEOARTHRITIS 07/21/2010   Qualifier: Diagnosis of  By: Orville Govern CMA, Carol     S/P CABG x 3 02/05/2020   Shortness of breath 07/21/2010   Qualifier: Diagnosis of  By: Jorene Minors, Scott     Thyroid disease    TOBACCO USE,  QUIT 07/21/2010   Qualifier: Diagnosis of  By: Orville Govern, CMA, Carol     Transient visual loss, right    wears glasses   UNSTABLE ANGINA 07/21/2010   Qualifier: Diagnosis of  By: Ronne Binning      Past Surgical History:  Procedure Laterality Date   CARDIAC CATHETERIZATION     CORONARY ARTERY BYPASS GRAFT N/A 02/05/2020   Procedure: CORONARY ARTERY BYPASS GRAFTING (CABG), ON PUMP, TIMES THREE, USING LEFT INTERNAL MAMMARY ARTERY TO LEFT ANTERIOR DESCENDING ARTERY, REVERSE SAPHENOUS VEIN GRAFT TO POSTERIOR DESCENDING ARTERY AND CIRCUMFLEX ARTERY;  Surgeon: Grace Isaac, MD;  Location: [facility];  Service: Open Heart Surgery;  Laterality: N/A;   ENDOVEIN HARVEST OF GREATER SAPHENOUS VEIN Right 02/05/2020   Procedure: ENDOVEIN HARVEST OF GREATER SAPHENOUS VEIN;  Surgeon: Grace Isaac, MD;  Location: [place];  Service: Open Heart Surgery;  Laterality: Right;   LEFT HEART CATH AND CORONARY ANGIOGRAPHY N/A 01/25/2020   Procedure: LEFT HEART CATH AND CORONARY ANGIOGRAPHY;  Surgeon: Leonie Man, MD;  Location: [facility];  Service: Cardiovascular;  Laterality: N/A;   TEE WITHOUT CARDIOVERSION N/A 02/05/2020   Procedure: TRANSESOPHAGEAL ECHOCARDIOGRAM (TEE);  Surgeon: Grace Isaac, MD;  Location: [facility];  Service: Open Heart Surgery;  Laterality: N/A;   TONSILLECTOMY AND ADENOIDECTOMY     WISDOM TOOTH EXTRACTION      Family History  Problem Relation Age of Onset   Coronary artery disease Other    Heart failure Mother    Alzheimer's disease Mother     Social History   Socioeconomic History   Marital status: Married    Spouse name: Not on file   Number of children: Not on file   Years of education: Not on file   Highest education level: Not on file  Occupational History   Not on file  Tobacco Use   Smoking status: Former    Packs/day: 1.50    Years: 37.00    Pack years: 55.50    Types: Cigarettes    Quit date: 05/2000    Years since quitting: 21.0   Smokeless  tobacco: Never  Vaping Use   Vaping Use: Never used  Substance and Sexual Activity   Alcohol use: No   Drug use: No   Sexual activity: Not Currently  Other Topics Concern   Not on file  Social History Narrative   He lives in [place] with family.  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         Social Determinants of Health   Financial Resource Strain: Not on file  Food Insecurity: Not on file  Transportation Needs: Not on file  Physical Activity: Sufficiently Active   Days of Exercise per Week: 5 days   Minutes of Exercise per Session: 30 min  Stress: Stress Concern Present   Feeling of Stress : To some extent  Social Connections: Unknown   Frequency of Communication with Friends and Family: Not on file   Frequency of Social Gatherings with Friends and Family: Not on file   Attends Religious Services: Not on Electrical engineer or Organizations: Not on file   Attends Archivist Meetings: Not on file   Marital Status: Married  Human resources officer Violence: Not on file    Outpatient Medications Prior to Visit  Medication Sig Dispense Refill   aspirin EC 81 MG tablet Take 1 tablet (81 mg total) by mouth daily. Swallow whole. 90 tablet 3   Calcium-Magnesium-Zinc (CAL-MAG-ZINC PO) Take 1 tablet by mouth daily.     COCONUT OIL PO Take 30 mLs by mouth daily.     ketoconazole (NIZORAL) 2 % cream Apply 1 application topically 2 (two) times daily. 15 g 0   levothyroxine (SYNTHROID) 25 MCG tablet Take 1 tablet (25 mcg total) by mouth daily before breakfast. 30 tablet 1   Multiple Vitamin (MULTIVITAMIN WITH MINERALS) TABS tablet Take 1 tablet by mouth daily.     nitroGLYCERIN (NITROSTAT) 0.4 MG SL tablet Place 1 tablet (0.4 mg total) under the tongue every 5 (five) minutes as needed. If require 2, recommend call 911. 50 tablet 0   predniSONE (DELTASONE) 5 MG tablet  Take 3 tablets (15 mg total) by mouth daily. 90 tablet 0   triamcinolone (KENALOG) 0.025 % ointment Apply 1 application topically 2 (two) times daily. 30 g 0   No facility-administered medications prior to visit.    Allergies  Allergen Reactions   Bactrim [Sulfamethoxazole-Trimethoprim] Shortness Of Breath    Breathing problems.  Rt  ear - hearing loss.    Codeine Nausea And Vomiting   Statins Other (See Comments)    Patient preference to not take statins/cholesterol meds    Review of Systems  Constitutional:  Negative for chills and fever.  HENT:  Negative for congestion, ear pain and sore throat.   Respiratory:  Positive for cough and shortness of breath.   Cardiovascular:  Positive for chest pain (burning).  Gastrointestinal:  Negative for abdominal pain, constipation, nausea and vomiting.      Objective:    Physical Exam Constitutional:      Appearance: Normal appearance.  Neurological:     Mental Status: He is alert.   No vital signs were done.  Patient was seen in our parking lot.  6 foot distance was maintained during the visit.  There were no vitals taken for this visit. Wt Readings from Last 3 Encounters:  04/10/21 213 lb (96.6 kg)  04/11/21 213 lb (96.6 kg)  03/13/21 215 lb (97.5 kg)    Health Maintenance Due  Topic Date Due   Pneumonia Vaccine 47+ Years old (1 - PCV) Never done   Hepatitis C Screening  Never done   Zoster Vaccines- Shingrix (1 of 2) Never done   COVID-19 Vaccine (3 - Booster for Janssen series) 04/22/2020    There are no preventive care reminders to display for this patient.   Lab Results  Component Value Date   TSH 5.280 (H) 04/02/2021   Lab Results  Component Value Date   WBC 10.6 04/02/2021   HGB 13.3 04/02/2021   HCT 38.7 04/02/2021   MCV 90 04/02/2021   PLT 270 04/02/2021   Lab Results  Component Value Date   NA 136 04/02/2021   K 4.6 04/02/2021   CO2 24 04/02/2021   GLUCOSE 92 04/02/2021   BUN 19 04/02/2021    CREATININE 0.82 04/02/2021   BILITOT 0.5 04/02/2021   ALKPHOS 52 04/02/2021   AST 14 04/02/2021   ALT 13 04/02/2021   PROT 6.4 04/02/2021   ALBUMIN 4.2 04/02/2021   CALCIUM 8.9 04/02/2021   ANIONGAP 11 02/10/2020   EGFR 89 04/02/2021   Lab Results  Component Value Date   CHOL 167 04/02/2021   Lab Results  Component Value Date   HDL 36 (L) 04/02/2021   Lab Results  Component Value Date   LDLCALC 118 (H) 04/02/2021   Lab Results  Component Value Date   TRIG 66 04/02/2021   Lab Results  Component Value Date   CHOLHDL 4.6 04/02/2021   Lab Results  Component Value Date   HGBA1C 6.2 (H) 07/23/2020       Assessment & Plan:   Problem List Items Addressed This Visit       Respiratory   Upper respiratory tract infection due to COVID-19 virus - Primary    Molnupiravir was sent to pharmacy. Recommend Tylenol for fevers or achiness. Recommend OTC cough medicine.  Your COVID test is positive. You should remain isolated and quarantine for at least 5 days from start of symptoms. You must be feeling better and be fever free without any fever reducers for at least 24 hours as well. You should wear a mask at all times when out of your home or around others for 5 days after leaving isolation.  Your household contacts should be tested as well as work contacts. If you feel worse or have increasing shortness of breath, you should be seen in person at urgent care or the emergency room.  Relevant Medications   molnupiravir EUA (LAGEVRIO) 200 mg CAPS capsule   Other Relevant Orders   POC COVID-19 (Completed)   POCT Influenza A/B (Completed)     Other   PMR (polymyalgia rheumatica) (HCC)    Continue prednisone 15 mg daily. Patient to return in 2 weeks for sed rate.  Patient to call if weakness or muscle pain increases.      Meds ordered this encounter  Medications   molnupiravir EUA (LAGEVRIO) 200 mg CAPS capsule    Sig: Take 4 capsules (800 mg total) by mouth 2 (two)  times daily for 5 days.    Dispense:  40 capsule    Refill:  0    Orders Placed This Encounter  Procedures   POC COVID-19   POCT Influenza A/B     Follow-up: Return if symptoms worsen, particularly shortness of breat, pt should return or go directly to the ED.  An After Visit Summary was printed and given to the patient.  Rochel Brome, MD [facility] [phone]

## 2021-05-14 NOTE — Assessment & Plan Note (Signed)
Continue prednisone 15 mg daily. Patient to return in 2 weeks for sed rate.  Patient to call if weakness or muscle pain increases.

## 2021-06-10 ENCOUNTER — Telehealth: Payer: Self-pay

## 2021-06-10 ENCOUNTER — Other Ambulatory Visit: Payer: Self-pay | Admitting: Family Medicine

## 2021-06-10 MED ORDER — PREDNISONE 5 MG PO TABS: 10.0000 mg | ORAL_TABLET | Freq: Every day | ORAL | 0 refills | Status: DC

## 2021-06-10 NOTE — Telephone Encounter (Signed)
Wife requesting next dose of prednisone. States provider was decreasing dose. He has one day left of current dose.   Harrell Lark 06/10/21 3:55 PM

## 2021-06-10 NOTE — Telephone Encounter (Signed)
Wife made aware. Appointment made.   Harrell Lark 06/10/21 4:37 PM

## 2021-06-23 ENCOUNTER — Telehealth: Payer: Self-pay

## 2021-06-23 NOTE — Telephone Encounter (Signed)
PA started on [facility] for Repatha 140 mg. [place] [id number]

## 2021-06-24 DIAGNOSIS — L209 Atopic dermatitis, unspecified: Secondary | ICD-10-CM | POA: Diagnosis not present

## 2021-06-24 NOTE — Telephone Encounter (Signed)
PA approved for Repatha on CMM from 06/14/21 to 06/13/22.

## 2021-06-30 ENCOUNTER — Encounter: Payer: Self-pay | Admitting: Family Medicine

## 2021-06-30 ENCOUNTER — Other Ambulatory Visit: Payer: Self-pay

## 2021-06-30 ENCOUNTER — Ambulatory Visit (INDEPENDENT_AMBULATORY_CARE_PROVIDER_SITE_OTHER): Payer: Medicare HMO | Admitting: Family Medicine

## 2021-06-30 VITALS — BP 104/78 | HR 84 | Temp 96.0°F | Resp 18 | Ht 67.0 in | Wt 216.0 lb

## 2021-06-30 DIAGNOSIS — M353 Polymyalgia rheumatica: Secondary | ICD-10-CM

## 2021-06-30 DIAGNOSIS — R413 Other amnesia: Secondary | ICD-10-CM | POA: Diagnosis not present

## 2021-06-30 NOTE — Assessment & Plan Note (Signed)
Plan to decrease prednisone to 5 mg once daily if esr normal. Pt understands he is to call back if his leg weakness and fatigue return.

## 2021-06-30 NOTE — Progress Notes (Signed)
Subjective:  Patient ID: Jeffery Turner, male    DOB: 1941/06/14  Age: 81 y.o. MRN: [id number]  Chief Complaint  Patient presents with   pmr    HPI PMR diagnosed 04/10/2021. Pt is currently on taper of prednisone 10 mg once daily. He continues to feel great. No weakness or leg pain. He has some issues with memory since he had his heart surgery. MRI brain in 2017 showed no cerebral atrophy. Questioning whether cbd oil would help. B12 and folate normal.   Current Outpatient Medications on File Prior to Visit  Medication Sig Dispense Refill   aspirin EC 81 MG tablet Take 1 tablet (81 mg total) by mouth daily. Swallow whole. 90 tablet 3   Calcium-Magnesium-Zinc (CAL-MAG-ZINC PO) Take 1 tablet by mouth daily.     COCONUT OIL PO Take 30 mLs by mouth daily.     ketoconazole (NIZORAL) 2 % cream Apply 1 application topically 2 (two) times daily. 15 g 0   levothyroxine (SYNTHROID) 25 MCG tablet Take 1 tablet (25 mcg total) by mouth daily before breakfast. 30 tablet 1   Multiple Vitamin (MULTIVITAMIN WITH MINERALS) TABS tablet Take 1 tablet by mouth daily.     nitroGLYCERIN (NITROSTAT) 0.4 MG SL tablet Place 1 tablet (0.4 mg total) under the tongue every 5 (five) minutes as needed. If require 2, recommend call 911. 50 tablet 0   predniSONE (DELTASONE) 5 MG tablet Take 2 tablets (10 mg total) by mouth daily. 60 tablet 0   triamcinolone (KENALOG) 0.025 % ointment Apply 1 application topically 2 (two) times daily. 30 g 0   No current facility-administered medications on file prior to visit.   Past Medical History:  Diagnosis Date   Anemia    Angina pectoris ([place]) 02/06/2018   Angina, class III ([place]) 02/06/2018   Anginal equivalent ([place]) 09/19/2019   CAD (coronary artery disease)    s/p stent to CFX 1997 2/2 MI s/p DES to [facility] and DES mLAD 07/14/10 2/2 [place] cath 07/14/10: dLAD 60%; CFX stent ok; OM1 30-40%; pRCA 30%; PDA 40%; EF 60%   Constant exophthalmos    right   Coronary artery disease of  native artery of native heart with stable angina pectoris ([place]) 07/21/2010   Qualifier: Diagnosis of  By: Denny Peon, CMA, [name]     Epistaxis 07/21/2010   Qualifier: Diagnosis of  By: Jorene Minors, Scott     Hearing loss    right eye - no hearing aids   Hyperlipidemia    MI (myocardial infarction) ([place])    Mixed hyperlipidemia 07/21/2010   Qualifier: Diagnosis of  By: Orville Govern, CMA, Carol     Old myocardial infarction 07/21/2010   Qualifier: Diagnosis of  By: Orville Govern CMA, Carol     Osteoarthritis    OSTEOARTHRITIS 07/21/2010   Qualifier: Diagnosis of  By: Orville Govern CMA, Carol     PMR (polymyalgia rheumatica) ([place]) 04/03/2021   S/P CABG x 3 02/05/2020   Shortness of breath 07/21/2010   Qualifier: Diagnosis of  By: Jorene Minors, Scott     Thyroid disease    TOBACCO USE, QUIT 07/21/2010   Qualifier: Diagnosis of  By: Orville Govern, CMA, Carol     Transient visual loss, right    wears glasses   UNSTABLE ANGINA 07/21/2010   Qualifier: Diagnosis of  By: Ronne Binning     Past Surgical History:  Procedure Laterality Date   CARDIAC CATHETERIZATION     CORONARY ARTERY BYPASS GRAFT N/A 02/05/2020   Procedure:  CORONARY ARTERY BYPASS GRAFTING (CABG), ON PUMP, TIMES THREE, USING LEFT INTERNAL MAMMARY ARTERY TO LEFT ANTERIOR DESCENDING ARTERY, REVERSE SAPHENOUS VEIN GRAFT TO POSTERIOR DESCENDING ARTERY AND CIRCUMFLEX ARTERY;  Surgeon: Grace Isaac, MD;  Location: [facility];  Service: Open Heart Surgery;  Laterality: N/A;   ENDOVEIN HARVEST OF GREATER SAPHENOUS VEIN Right 02/05/2020   Procedure: ENDOVEIN HARVEST OF GREATER SAPHENOUS VEIN;  Surgeon: Grace Isaac, MD;  Location: [place];  Service: Open Heart Surgery;  Laterality: Right;   LEFT HEART CATH AND CORONARY ANGIOGRAPHY N/A 01/25/2020   Procedure: LEFT HEART CATH AND CORONARY ANGIOGRAPHY;  Surgeon: Leonie Man, MD;  Location: [facility];  Service: Cardiovascular;  Laterality: N/A;   TEE WITHOUT CARDIOVERSION N/A 02/05/2020   Procedure:  TRANSESOPHAGEAL ECHOCARDIOGRAM (TEE);  Surgeon: Grace Isaac, MD;  Location: [facility];  Service: Open Heart Surgery;  Laterality: N/A;   TONSILLECTOMY AND ADENOIDECTOMY     WISDOM TOOTH EXTRACTION      Family History  Problem Relation Age of Onset   Coronary artery disease Other    Heart failure Mother    Alzheimer's disease Mother    Social History   Socioeconomic History   Marital status: Married    Spouse name: Not on file   Number of children: Not on file   Years of education: Not on file   Highest education level: Not on file  Occupational History   Not on file  Tobacco Use   Smoking status: Former    Packs/day: 1.50    Years: 37.00    Pack years: 55.50    Types: Cigarettes    Quit date: 05/2000    Years since quitting: 21.1   Smokeless tobacco: Never  Vaping Use   Vaping Use: Never used  Substance and Sexual Activity   Alcohol use: No   Drug use: No   Sexual activity: Not Currently  Other Topics Concern   Not on file  Social History Narrative   He lives in [place] with family.  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         Social Determinants of Health   Financial Resource Strain: Not on file  Food Insecurity: Not on file  Transportation Needs: Not on file  Physical Activity: Not on file  Stress: Not on file  Social Connections: Not on file    Review of Systems  Constitutional:  Negative for chills and fever.  HENT:  Negative for congestion, rhinorrhea and sore throat.   Respiratory:  Positive for shortness of breath. Negative for cough.   Cardiovascular:  Negative for chest pain and palpitations.  Gastrointestinal:  Negative for abdominal pain, constipation, diarrhea, nausea and vomiting.  Genitourinary:  Negative for dysuria and urgency.  Musculoskeletal:  Negative for arthralgias, back pain and myalgias (leg pain much improved with prednisone  therapy).  Neurological:  Negative for dizziness and headaches.  Psychiatric/Behavioral:  Negative for dysphoric mood. The patient is not nervous/anxious.     Objective:  BP 104/78    Pulse 84    Temp (!) 96 F (35.6 C)    Resp 18    Ht 5' 7"  (1.702 m)    Wt 216 lb (98 kg)    BMI 33.83 kg/m   BP/Weight 06/30/2021 04/10/2021 40/01/6760  Systolic BP 950 932 671  Diastolic BP 78 68  72  Wt. (Lbs) 216 213 213  BMI 33.83 33.36 33.36    Physical Exam Vitals reviewed.  Constitutional:      Appearance: Normal appearance.  Cardiovascular:     Rate and Rhythm: Normal rate and regular rhythm.     Heart sounds: Normal heart sounds.  Pulmonary:     Effort: Pulmonary effort is normal.     Breath sounds: Normal breath sounds. No wheezing, rhonchi or rales.  Neurological:     Mental Status: He is alert.  Psychiatric:        Mood and Affect: Mood normal.        Behavior: Behavior normal.    Diabetic Foot Exam - Simple   No data filed      Lab Results  Component Value Date   WBC 10.6 04/02/2021   HGB 13.3 04/02/2021   HCT 38.7 04/02/2021   PLT 270 04/02/2021   GLUCOSE 92 04/02/2021   CHOL 167 04/02/2021   TRIG 66 04/02/2021   HDL 36 (L) 04/02/2021   LDLDIRECT 152.5 10/05/2010   LDLCALC 118 (H) 04/02/2021   ALT 13 04/02/2021   AST 14 04/02/2021   NA 136 04/02/2021   K 4.6 04/02/2021   CL 101 04/02/2021   CREATININE 0.82 04/02/2021   BUN 19 04/02/2021   CO2 24 04/02/2021   TSH 5.280 (H) 04/02/2021   INR 1.4 (H) 02/05/2020   HGBA1C 6.2 (H) 07/23/2020   Assessment & Plan:   Problem List Items Addressed This Visit       Other   Memory loss    MMSE 29/30. No further work up needed at this time.  Not extensive studies       PMR (polymyalgia rheumatica) (HCC) - Primary    Plan to decrease prednisone to 5 mg once daily if esr normal. Pt understands he is to call back if his leg weakness and fatigue return.       Relevant Orders   Sedimentation rate  . Orders Placed  This Encounter  Procedures   Sedimentation rate     Follow-up: Return in about 4 weeks (around 07/28/2021) for chronic fasting.  An After Visit Summary was printed and given to the patient.  Rochel Brome, MD [facility] [phone]

## 2021-06-30 NOTE — Assessment & Plan Note (Signed)
MMSE 29/30. No further work up needed at this time.  Not extensive studies

## 2021-07-01 LAB — SEDIMENTATION RATE: Sed Rate: 7 mm/hr (ref 0–30)

## 2021-07-02 ENCOUNTER — Other Ambulatory Visit: Payer: Self-pay

## 2021-07-02 MED ORDER — PREDNISONE 5 MG PO TABS: 10.0000 mg | ORAL_TABLET | Freq: Every day | ORAL | 0 refills | Status: DC

## 2021-08-05 ENCOUNTER — Ambulatory Visit: Payer: Medicare HMO | Admitting: Family Medicine

## 2021-08-10 ENCOUNTER — Other Ambulatory Visit: Payer: Medicare HMO

## 2021-08-10 ENCOUNTER — Other Ambulatory Visit: Payer: Self-pay

## 2021-08-10 DIAGNOSIS — E038 Other specified hypothyroidism: Secondary | ICD-10-CM

## 2021-08-10 DIAGNOSIS — E782 Mixed hyperlipidemia: Secondary | ICD-10-CM

## 2021-08-10 DIAGNOSIS — I119 Hypertensive heart disease without heart failure: Secondary | ICD-10-CM | POA: Diagnosis not present

## 2021-08-11 ENCOUNTER — Encounter: Payer: Self-pay | Admitting: Family Medicine

## 2021-08-11 ENCOUNTER — Other Ambulatory Visit: Payer: Medicare HMO

## 2021-08-11 ENCOUNTER — Ambulatory Visit (INDEPENDENT_AMBULATORY_CARE_PROVIDER_SITE_OTHER): Payer: Medicare HMO | Admitting: Family Medicine

## 2021-08-11 VITALS — BP 98/62 | HR 89 | Temp 98.0°F | Ht 67.0 in | Wt 219.0 lb

## 2021-08-11 DIAGNOSIS — R400 Somnolence: Secondary | ICD-10-CM

## 2021-08-11 DIAGNOSIS — R0683 Snoring: Secondary | ICD-10-CM

## 2021-08-11 DIAGNOSIS — R7303 Prediabetes: Secondary | ICD-10-CM | POA: Diagnosis not present

## 2021-08-11 DIAGNOSIS — E782 Mixed hyperlipidemia: Secondary | ICD-10-CM

## 2021-08-11 DIAGNOSIS — G720 Drug-induced myopathy: Secondary | ICD-10-CM

## 2021-08-11 DIAGNOSIS — R0681 Apnea, not elsewhere classified: Secondary | ICD-10-CM | POA: Diagnosis not present

## 2021-08-11 DIAGNOSIS — E038 Other specified hypothyroidism: Secondary | ICD-10-CM

## 2021-08-11 DIAGNOSIS — R413 Other amnesia: Secondary | ICD-10-CM

## 2021-08-11 DIAGNOSIS — T466X5A Adverse effect of antihyperlipidemic and antiarteriosclerotic drugs, initial encounter: Secondary | ICD-10-CM

## 2021-08-11 DIAGNOSIS — M353 Polymyalgia rheumatica: Secondary | ICD-10-CM | POA: Diagnosis not present

## 2021-08-11 DIAGNOSIS — I119 Hypertensive heart disease without heart failure: Secondary | ICD-10-CM | POA: Diagnosis not present

## 2021-08-11 DIAGNOSIS — I739 Peripheral vascular disease, unspecified: Secondary | ICD-10-CM

## 2021-08-11 HISTORY — DX: Prediabetes: R73.03

## 2021-08-11 HISTORY — DX: Somnolence: R40.0

## 2021-08-11 HISTORY — DX: Apnea, not elsewhere classified: R06.81

## 2021-08-11 HISTORY — DX: Hypertensive heart disease without heart failure: I11.9

## 2021-08-11 HISTORY — DX: Peripheral vascular disease, unspecified: I73.9

## 2021-08-11 LAB — COMPREHENSIVE METABOLIC PANEL
ALT: 15 IU/L (ref 0–44)
AST: 13 IU/L (ref 0–40)
Albumin/Globulin Ratio: 1.7 (ref 1.2–2.2)
Albumin: 4.4 g/dL (ref 3.7–4.7)
Alkaline Phosphatase: 59 IU/L (ref 44–121)
BUN/Creatinine Ratio: 16 (ref 10–24)
BUN: 18 mg/dL (ref 8–27)
Bilirubin Total: 0.5 mg/dL (ref 0.0–1.2)
CO2: 23 mmol/L (ref 20–29)
Calcium: 9.1 mg/dL (ref 8.6–10.2)
Chloride: 98 mmol/L (ref 96–106)
Creatinine, Ser: 1.1 mg/dL (ref 0.76–1.27)
Globulin, Total: 2.6 g/dL (ref 1.5–4.5)
Glucose: 103 mg/dL — ABNORMAL HIGH (ref 70–99)
Potassium: 4.6 mmol/L (ref 3.5–5.2)
Sodium: 134 mmol/L (ref 134–144)
Total Protein: 7 g/dL (ref 6.0–8.5)
eGFR: 68 mL/min/{1.73_m2} (ref >59)

## 2021-08-11 LAB — CBC WITH DIFFERENTIAL/PLATELET
Basophils Absolute: 0 10*3/uL (ref 0.0–0.2)
Basos: 1 %
EOS (ABSOLUTE): 0.1 10*3/uL (ref 0.0–0.4)
Eos: 2 %
Hematocrit: 41.4 % (ref 37.5–51.0)
Hemoglobin: 14.3 g/dL (ref 13.0–17.7)
Immature Grans (Abs): 0 10*3/uL (ref 0.0–0.1)
Immature Granulocytes: 1 %
Lymphocytes Absolute: 1.5 10*3/uL (ref 0.7–3.1)
Lymphs: 29 %
MCH: 30.9 pg (ref 26.6–33.0)
MCHC: 34.5 g/dL (ref 31.5–35.7)
MCV: 89 fL (ref 79–97)
Monocytes Absolute: 0.8 10*3/uL (ref 0.1–0.9)
Monocytes: 15 %
Neutrophils Absolute: 2.8 10*3/uL (ref 1.4–7.0)
Neutrophils: 52 %
Platelets: 319 10*3/uL (ref 150–450)
RBC: 4.63 x10E6/uL (ref 4.14–5.80)
RDW: 12.3 % (ref 11.6–15.4)
WBC: 5.2 10*3/uL (ref 3.4–10.8)

## 2021-08-11 LAB — LIPID PANEL
Chol/HDL Ratio: 6.8 ratio — ABNORMAL HIGH (ref 0.0–5.0)
Cholesterol, Total: 190 mg/dL (ref 100–199)
HDL: 28 mg/dL — ABNORMAL LOW (ref >39)
LDL Chol Calc (NIH): 134 mg/dL — ABNORMAL HIGH (ref 0–99)
Triglycerides: 152 mg/dL — ABNORMAL HIGH (ref 0–149)
VLDL Cholesterol Cal: 28 mg/dL (ref 5–40)

## 2021-08-11 LAB — CARDIOVASCULAR RISK ASSESSMENT

## 2021-08-11 LAB — TSH: TSH: 3.08 u[IU]/mL (ref 0.450–4.500)

## 2021-08-11 MED ORDER — LEVOTHYROXINE SODIUM 25 MCG PO TABS: 25.0000 ug | ORAL_TABLET | Freq: Every day | ORAL | 3 refills | Status: DC

## 2021-08-11 NOTE — Assessment & Plan Note (Signed)
Ordering home sleep study 

## 2021-08-11 NOTE — Assessment & Plan Note (Signed)
Resolved. If recurrent symptoms occur, patient to call.

## 2021-08-11 NOTE — Assessment & Plan Note (Signed)
Order brain mri  Also may be secondary to apnea.

## 2021-08-11 NOTE — Assessment & Plan Note (Signed)
Check a1c in 3 months. Patient has been on prednisone over the last 2 months

## 2021-08-11 NOTE — Progress Notes (Signed)
Blood count normal.  Liver function normal.  Kidney function normal.  Thyroid function normal.  Cholesterol: elevated. Known cad. Recommended consider repatha. Information given. Patient to consider. I discussed at his appointment.

## 2021-08-11 NOTE — Assessment & Plan Note (Addendum)
Not at goal. Recommend start on repatha. Patient to consider. Education given

## 2021-08-11 NOTE — Patient Instructions (Signed)
Ordering home sleep study.  Order an MRI of Brain.  Let me know if legs start to get weak again.   Consider repatha.

## 2021-08-11 NOTE — Progress Notes (Signed)
Subjective:  Patient ID: Jeffery Turner, male    DOB: January 12, 1941  Age: 81 y.o. MRN: [id number]  Chief Complaint  Patient presents with   Hypertension   Hyperlipidemia   Hypothyroidism   HPI: Hyperlipidemia: intolerant to statins. Tries to eat healthy.   Hypertensive Heart Disease without heart failure: S/P CABG 2021. On aspirin 81 mg daily. Has ntg.  Hypothyroidism: tsh therapeutic. On synthroid 25 mcg once daily in am.   Dyspnea at times. Mainly with exertion. Snores a lot. Stops breathing in sleep. Feels tired a lot. Daytime somnolence.  PMR: discontinued prednisone 1-2 weeks ago. Patient's leg weakness has resolved.   Complaining of memory loss since CABG. Forgets things from yesterday. Previous lab work up was normal 3 months go. MMSE done 3 months ago was 29/30. MRI brain in 2017 showed no atrophy.   Current Outpatient Medications on File Prior to Visit  Medication Sig Dispense Refill   aspirin EC 81 MG tablet Take 1 tablet (81 mg total) by mouth daily. Swallow whole. 90 tablet 3   Calcium-Magnesium-Zinc (CAL-MAG-ZINC PO) Take 1 tablet by mouth daily.     COCONUT OIL PO Take 30 mLs by mouth daily.     Multiple Vitamin (MULTIVITAMIN WITH MINERALS) TABS tablet Take 1 tablet by mouth daily.     nitroGLYCERIN (NITROSTAT) 0.4 MG SL tablet Place 1 tablet (0.4 mg total) under the tongue every 5 (five) minutes as needed. If require 2, recommend call 911. 50 tablet 0   triamcinolone (KENALOG) 0.025 % ointment Apply 1 application topically 2 (two) times daily. 30 g 0   ketoconazole (NIZORAL) 2 % cream Apply 1 application topically 2 (two) times daily. (Patient not taking: Reported on 08/11/2021) 15 g 0   No current facility-administered medications on file prior to visit.   Past Medical History:  Diagnosis Date   Anemia    Angina pectoris ([place]) 02/06/2018   Angina, class III ([place]) 02/06/2018   Anginal equivalent ([place]) 09/19/2019   CAD (coronary artery disease)    s/p stent to CFX  1997 2/2 MI s/p DES to [facility] and DES mLAD 07/14/10 2/2 [place] cath 07/14/10: dLAD 60%; CFX stent ok; OM1 30-40%; pRCA 30%; PDA 40%; EF 60%   Constant exophthalmos    right   Coronary artery disease of native artery of native heart with stable angina pectoris ([place]) 07/21/2010   Qualifier: Diagnosis of  By: Denny Peon, CMA, [name]     Epistaxis 07/21/2010   Qualifier: Diagnosis of  By: Jorene Minors, Scott     Hearing loss    right eye - no hearing aids   Hyperlipidemia    MI (myocardial infarction) ([place])    Mixed hyperlipidemia 07/21/2010   Qualifier: Diagnosis of  By: Orville Govern CMA, Carol     Old myocardial infarction 07/21/2010   Qualifier: Diagnosis of  By: Orville Govern CMA, Carol     Osteoarthritis    OSTEOARTHRITIS 07/21/2010   Qualifier: Diagnosis of  By: Orville Govern CMA, Carol     PMR (polymyalgia rheumatica) ([place]) 04/03/2021   S/P CABG x 3 02/05/2020   Shortness of breath 07/21/2010   Qualifier: Diagnosis of  By: Jorene Minors, Scott     Thyroid disease    TOBACCO USE, QUIT 07/21/2010   Qualifier: Diagnosis of  By: Orville Govern, CMA, Carol     Transient visual loss, right    wears glasses   UNSTABLE ANGINA 07/21/2010   Qualifier: Diagnosis of  By: Orville Govern CMA, Eloy End  respiratory tract infection due to COVID-19 virus 05/14/2021   Past Surgical History:  Procedure Laterality Date   CARDIAC CATHETERIZATION     CORONARY ARTERY BYPASS GRAFT N/A 02/05/2020   Procedure: CORONARY ARTERY BYPASS GRAFTING (CABG), ON PUMP, TIMES THREE, USING LEFT INTERNAL MAMMARY ARTERY TO LEFT ANTERIOR DESCENDING ARTERY, REVERSE SAPHENOUS VEIN GRAFT TO POSTERIOR DESCENDING ARTERY AND CIRCUMFLEX ARTERY;  Surgeon: Grace Isaac, MD;  Location: [place];  Service: Open Heart Surgery;  Laterality: N/A;   ENDOVEIN HARVEST OF GREATER SAPHENOUS VEIN Right 02/05/2020   Procedure: ENDOVEIN HARVEST OF GREATER SAPHENOUS VEIN;  Surgeon: Grace Isaac, MD;  Location: [place];  Service: Open Heart Surgery;  Laterality: Right;   LEFT  HEART CATH AND CORONARY ANGIOGRAPHY N/A 01/25/2020   Procedure: LEFT HEART CATH AND CORONARY ANGIOGRAPHY;  Surgeon: Leonie Man, MD;  Location: [place] [facility];  Service: Cardiovascular;  Laterality: N/A;   TEE WITHOUT CARDIOVERSION N/A 02/05/2020   Procedure: TRANSESOPHAGEAL ECHOCARDIOGRAM (TEE);  Surgeon: Grace Isaac, MD;  Location: [place];  Service: Open Heart Surgery;  Laterality: N/A;   TONSILLECTOMY AND ADENOIDECTOMY     WISDOM TOOTH EXTRACTION      Family History  Problem Relation Age of Onset   Coronary artery disease Other    Heart failure Mother    Alzheimer's disease Mother    Social History   Socioeconomic History   Marital status: Married    Spouse name: Not on file   Number of children: Not on file   Years of education: Not on file   Highest education level: Not on file  Occupational History   Not on file  Tobacco Use   Smoking status: Former    Packs/day: 1.50    Years: 37.00    Pack years: 55.50    Types: Cigarettes    Quit date: 05/2000    Years since quitting: 21.2   Smokeless tobacco: Never  Vaping Use   Vaping Use: Never used  Substance and Sexual Activity   Alcohol use: No   Drug use: No   Sexual activity: Not Currently  Other Topics Concern   Not on file  Social History Narrative   He lives in [place] with family.  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         Social Determinants of Health   Financial Resource Strain: Not on file  Food Insecurity: Not on file  Transportation Needs: Not on file  Physical Activity: Not on file  Stress: Not on file  Social Connections: Not on file    Review of Systems  Constitutional:  Positive for fatigue. Negative for chills and fever.  HENT:  Negative for congestion, ear pain and sore throat.   Respiratory:  Negative for cough and shortness of breath.   Cardiovascular:  Negative for chest  pain and leg swelling.  Gastrointestinal:  Negative for abdominal pain, constipation, diarrhea, nausea and vomiting.  Genitourinary:  Negative for dysuria and frequency.  Musculoskeletal:  Negative for arthralgias and myalgias.  Neurological:  Negative for dizziness and headaches.       Memory loss  Psychiatric/Behavioral:  Negative for dysphoric mood. The patient is not nervous/anxious.     Objective:  BP 98/62 (BP Location: Right Arm, Patient Position: Sitting)    Pulse 89    Temp 98 F (36.7 C) (Oral)  Ht 5\' 7"  (1.702 m)    Wt 219 lb (99.3 kg)    SpO2 98%    BMI 34.30 kg/m   BP/Weight 08/11/2021 06/30/2021 66/11/43  Systolic BP 98 997 741  Diastolic BP 62 78 68  Wt. (Lbs) 219 216 213  BMI 34.3 33.83 33.36    Physical Exam Vitals reviewed.  Constitutional:      Appearance: Normal appearance. He is normal weight.  Cardiovascular:     Rate and Rhythm: Normal rate and regular rhythm.     Pulses: Normal pulses.     Heart sounds: Normal heart sounds.  Pulmonary:     Breath sounds: Normal breath sounds.  Abdominal:     General: Abdomen is flat. Bowel sounds are normal.     Palpations: Abdomen is soft.  Skin:    General: Skin is warm and dry.  Neurological:     Mental Status: He is alert and oriented to person, place, and time.  Psychiatric:        Mood and Affect: Mood normal.        Behavior: Behavior normal.    Diabetic Foot Exam - Simple   No data filed      Lab Results  Component Value Date   WBC 5.2 08/10/2021   HGB 14.3 08/10/2021   HCT 41.4 08/10/2021   PLT 319 08/10/2021   GLUCOSE 103 (H) 08/10/2021   CHOL 190 08/10/2021   TRIG 152 (H) 08/10/2021   HDL 28 (L) 08/10/2021   LDLDIRECT 152.5 10/05/2010   LDLCALC 134 (H) 08/10/2021   ALT 15 08/10/2021   AST 13 08/10/2021   NA 134 08/10/2021   K 4.6 08/10/2021   CL 98 08/10/2021   CREATININE 1.10 08/10/2021   BUN 18 08/10/2021   CO2 23 08/10/2021   TSH 3.080 08/10/2021   INR 1.4 (H) 02/05/2020    HGBA1C 6.2 (H) 07/23/2020      Assessment & Plan:   Problem List Items Addressed This Visit       Cardiovascular and Mediastinum   Hypertensive heart disease without heart failure     Endocrine   Secondary hypothyroidism    The current medical regimen is effective;  continue present plan and medications.       Relevant Medications   levothyroxine (SYNTHROID) 25 MCG tablet     Musculoskeletal and Integument   Statin myopathy    Intolerant to statins.         Other   Mixed hyperlipidemia - Primary    Not at goal. Recommend start on repatha. Patient to consider. Education given      Memory loss    Order brain mri  Also may be secondary to apnea.      Relevant Orders   MR BRAIN W WO CONTRAST   Home sleep test   PMR (polymyalgia rheumatica) (HCC)    Resolved. If recurrent symptoms occur, patient to call.       Prediabetes    Check a1c in 3 months. Patient has been on prednisone over the last 2 months       Apnea    Ordering home sleep study.       Relevant Orders   Home sleep test   Daytime somnolence    Ordering home sleep study.       Relevant Orders   Home sleep test   Snoring    Ordering home sleep study.       Relevant Orders   Home sleep test  .  Meds ordered this encounter  Medications   levothyroxine (SYNTHROID) 25 MCG tablet    Sig: Take 1 tablet (25 mcg total) by mouth daily before breakfast.    Dispense:  90 tablet    Refill:  3    Orders Placed This Encounter  Procedures   MR BRAIN W WO CONTRAST   Home sleep test    Total time spent on today's visit was greater than 30 minutes, including both face-to-face time and nonface-to-face time personally spent on review of chart (labs and imaging), discussing labs and goals, discussing further work-up, treatment options, answering patient's questions, and coordinating care.  Follow-up: Return in about 6 weeks (around 09/22/2021) for with [facility]. , chronic fasting 4 months. .  An  After Visit Summary was printed and given to the patient.  I,Lauren M Auman,acting as a scribe for Rochel Brome, MD.,have documented all relevant documentation on the behalf of Rochel Brome, MD,as directed by  Rochel Brome, MD while in the presence of Rochel Brome, MD.    Rochel Brome, MD [place] [phone]

## 2021-08-11 NOTE — Assessment & Plan Note (Signed)
Intolerant to statins. 

## 2021-08-11 NOTE — Assessment & Plan Note (Signed)
The current medical regimen is effective;  continue present plan and medications.  

## 2021-08-12 DIAGNOSIS — L820 Inflamed seborrheic keratosis: Secondary | ICD-10-CM | POA: Diagnosis not present

## 2021-08-12 DIAGNOSIS — L821 Other seborrheic keratosis: Secondary | ICD-10-CM | POA: Diagnosis not present

## 2021-08-12 DIAGNOSIS — L209 Atopic dermatitis, unspecified: Secondary | ICD-10-CM | POA: Diagnosis not present

## 2021-08-24 ENCOUNTER — Ambulatory Visit
Admission: RE | Admit: 2021-08-24 | Discharge: 2021-08-24 | Disposition: A | Discharge status: Home / Self Care | Payer: Medicare HMO | Source: Ambulatory Visit | Attending: Family Medicine | Admitting: Family Medicine

## 2021-08-24 ENCOUNTER — Other Ambulatory Visit: Payer: Self-pay

## 2021-08-24 DIAGNOSIS — I6782 Cerebral ischemia: Secondary | ICD-10-CM | POA: Diagnosis not present

## 2021-08-24 DIAGNOSIS — R413 Other amnesia: Secondary | ICD-10-CM | POA: Diagnosis not present

## 2021-08-24 DIAGNOSIS — R4182 Altered mental status, unspecified: Secondary | ICD-10-CM | POA: Diagnosis not present

## 2021-08-24 MED ORDER — GADOBENATE DIMEGLUMINE 529 MG/ML IV SOLN
20.0000 mL | Freq: Once | INTRAVENOUS | Status: AC | PRN
  Administered 2021-08-24: 20 mL via INTRAVENOUS

## 2021-08-26 ENCOUNTER — Other Ambulatory Visit: Payer: Self-pay

## 2021-08-26 DIAGNOSIS — R413 Other amnesia: Secondary | ICD-10-CM

## 2021-08-26 DIAGNOSIS — C4402 Squamous cell carcinoma of skin of lip: Secondary | ICD-10-CM | POA: Diagnosis not present

## 2021-08-26 MED ORDER — MEMANTINE HCL 10 MG PO TABS: 10.0000 mg | ORAL_TABLET | Freq: Two times a day (BID) | ORAL | 1 refills | Status: DC

## 2021-09-22 ENCOUNTER — Encounter: Payer: Self-pay | Admitting: Nurse Practitioner

## 2021-09-22 ENCOUNTER — Ambulatory Visit (INDEPENDENT_AMBULATORY_CARE_PROVIDER_SITE_OTHER): Payer: Medicare HMO | Admitting: Nurse Practitioner

## 2021-09-22 VITALS — BP 110/72 | HR 80 | Temp 97.2°F | Ht 68.0 in | Wt 220.0 lb

## 2021-09-22 DIAGNOSIS — G319 Degenerative disease of nervous system, unspecified: Secondary | ICD-10-CM | POA: Diagnosis not present

## 2021-09-22 DIAGNOSIS — R413 Other amnesia: Secondary | ICD-10-CM

## 2021-09-22 DIAGNOSIS — I679 Cerebrovascular disease, unspecified: Secondary | ICD-10-CM

## 2021-09-22 NOTE — Progress Notes (Signed)
? ?Subjective:  ?Patient ID: Jeffery Turner, male    DOB: Oct 31, 1940  Age: 81 y.o. MRN: [id number] ? ?Chief Complaint  ?Patient presents with  ? Dementia med f/u  ?   ? ? ?HPI ? Jeffery Turner is an 81 year old Caucasian male that presents for follow-up of recently diagnosed with dementia related to chronic small vessel disease and parenchymal volume loss per brain MRI.He is accompanied by his spouse, Jeffery Turner. Jeffery Turner was prescribed Namenda 10 mg BID per Dr Jeffery Turner, PCP. He denies side effects or intolerance to medication. Pt and spouse report improvement in cognitive function.  ? ? ? ? ?Current Outpatient Medications on File Prior to Visit  ?Medication Sig Dispense Refill  ? aspirin EC 81 MG tablet Take 1 tablet (81 mg total) by mouth daily. Swallow whole. 90 tablet 3  ? Calcium-Magnesium-Zinc (CAL-MAG-ZINC PO) Take 1 tablet by mouth daily.    ? COCONUT OIL PO Take 30 mLs by mouth daily.    ? ketoconazole (NIZORAL) 2 % cream Apply 1 application topically 2 (two) times daily. (Patient not taking: Reported on 08/11/2021) 15 g 0  ? levothyroxine (SYNTHROID) 25 MCG tablet Take 1 tablet (25 mcg total) by mouth daily before breakfast. 90 tablet 3  ? memantine (NAMENDA) 10 MG tablet Take 1 tablet (10 mg total) by mouth 2 (two) times daily. 180 tablet 1  ? Multiple Vitamin (MULTIVITAMIN WITH MINERALS) TABS tablet Take 1 tablet by mouth daily.    ? nitroGLYCERIN (NITROSTAT) 0.4 MG SL tablet Place 1 tablet (0.4 mg total) under the tongue every 5 (five) minutes as needed. If require 2, recommend call 911. 50 tablet 0  ? triamcinolone (KENALOG) 0.025 % ointment Apply 1 application topically 2 (two) times daily. 30 g 0  ? ?No current facility-administered medications on file prior to visit.  ? ?Past Medical History:  ?Diagnosis Date  ? Anemia   ? Angina pectoris ([place]) 02/06/2018  ? Angina, class III ([place]) 02/06/2018  ? Anginal equivalent ([place]) 09/19/2019  ? CAD (coronary artery disease)   ? s/p stent to CFX 1997 2/2 MI s/p DES to [facility] and DES mLAD  07/14/10 2/2 [place] cath 07/14/10: dLAD 60%; CFX stent ok; OM1 30-40%; pRCA 30%; PDA 40%; EF 60%  ? Constant exophthalmos   ? right  ? Coronary artery disease of native artery of native heart with stable angina pectoris ([place]) 07/21/2010  ? Qualifier: Diagnosis of  By: Jeffery Turner, [place], Jeffery Turner    ? Epistaxis 07/21/2010  ? Qualifier: Diagnosis of  By: Jeffery Turner, Jeffery Turner    ? Hearing loss   ? right eye - no hearing aids  ? Hyperlipidemia   ? MI (myocardial infarction) ([place])   ? Mixed hyperlipidemia 07/21/2010  ? Qualifier: Diagnosis of  By: Jeffery Turner    ? Old myocardial infarction 07/21/2010  ? Qualifier: Diagnosis of  By: Jeffery Turner    ? Osteoarthritis   ? OSTEOARTHRITIS 07/21/2010  ? Qualifier: Diagnosis of  By: Jeffery Turner    ? PMR (polymyalgia rheumatica) ([place]) 04/03/2021  ? S/P CABG x 3 02/05/2020  ? Shortness of breath 07/21/2010  ? Qualifier: Diagnosis of  By: Jeffery Turner, Jeffery Turner    ? Thyroid disease   ? TOBACCO USE, QUIT 07/21/2010  ? Qualifier: Diagnosis of  By: Jeffery Turner    ? Transient visual loss, right   ? wears glasses  ? UNSTABLE ANGINA 07/21/2010  ? Qualifier: Diagnosis of  By: Jeffery Turner    ?  Upper respiratory tract infection due to COVID-19 virus 05/14/2021  ? ?Past Surgical History:  ?Procedure Laterality Date  ? CARDIAC CATHETERIZATION    ? CORONARY ARTERY BYPASS GRAFT N/A 02/05/2020  ? Procedure: CORONARY ARTERY BYPASS GRAFTING (CABG), ON PUMP, TIMES THREE, USING LEFT INTERNAL MAMMARY ARTERY TO LEFT ANTERIOR DESCENDING ARTERY, REVERSE SAPHENOUS VEIN GRAFT TO POSTERIOR DESCENDING ARTERY AND CIRCUMFLEX ARTERY;  Surgeon: Jeffery Isaac, MD;  Location: [place];  Service: Open Heart Surgery;  Laterality: N/A;  ? ENDOVEIN HARVEST OF GREATER SAPHENOUS VEIN Right 02/05/2020  ? Procedure: ENDOVEIN HARVEST OF GREATER SAPHENOUS VEIN;  Surgeon: Jeffery Isaac, MD;  Location: [place];  Service: Open Heart Surgery;  Laterality: Right;  ? LEFT HEART CATH AND CORONARY ANGIOGRAPHY N/A  01/25/2020  ? Procedure: LEFT HEART CATH AND CORONARY ANGIOGRAPHY;  Surgeon: Jeffery Man, MD;  Location: [place] [facility];  Service: Cardiovascular;  Laterality: N/A;  ? TEE WITHOUT CARDIOVERSION N/A 02/05/2020  ? Procedure: TRANSESOPHAGEAL ECHOCARDIOGRAM (TEE);  Surgeon: Jeffery Isaac, MD;  Location: [place];  Service: Open Heart Surgery;  Laterality: N/A;  ? TONSILLECTOMY AND ADENOIDECTOMY    ? WISDOM TOOTH EXTRACTION    ?  ?Family History  ?Problem Relation Age of Onset  ? Coronary artery disease Other   ? Heart failure Mother   ? Alzheimer's disease Mother   ? ?Social History  ? ?Socioeconomic History  ? Marital status: Married  ?  Spouse name: Not on file  ? Number of children: Not on file  ? Years of education: Not on file  ? Highest education level: Not on file  ?Occupational History  ? Not on file  ?Tobacco Use  ? Smoking status: Former  ?  Packs/day: 1.50  ?  Years: 37.00  ?  Pack years: 55.50  ?  Types: Cigarettes  ?  Quit date: 05/2000  ?  Years since quitting: 21.3  ? Smokeless tobacco: Never  ?Vaping Use  ? Vaping Use: Never used  ?Substance and Sexual Activity  ? Alcohol use: No  ? Drug use: No  ? Sexual activity: Not Currently  ?Other Topics Concern  ? Not on file  ?Social History Narrative  ? He lives in [place] with family.  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  He quit tobacco in 1997 with approximately 30-pack-   ?  year history and denies alcohol or drug abuse.   ?     ? ?Social Determinants of Health  ? ?Financial Resource Strain: Not on file  ?Food Insecurity: Not on file  ?Transportation Needs: Not on file  ?Physical Activity: Not on file  ?Stress: Not on file  ?Social Connections: Not on file  ? ? ?Review of Systems  ?Constitutional:  Negative for chills, diaphoresis, fatigue and fever.  ?HENT:  Negative for congestion, ear pain and sore throat.   ?Respiratory:  Negative for cough and shortness of breath.   ?Cardiovascular:  Negative for chest pain and leg swelling.   ?Gastrointestinal:  Negative for abdominal pain, constipation, diarrhea, nausea and vomiting.  ?Genitourinary:  Negative for dysuria and urgency.  ?Musculoskeletal:  Negative for arthralgias and myalgias.  ?Neurological:  Negative for dizziness and headaches.  ?Psychiatric/Behavioral:  Positive for decreased concentration. Negative for dysphoric mood.   ? ? ?Objective:  ?BP 110/72   Pulse 80   Temp (!) 97.2 ?F (36.2 ?C)   Ht '5\' 8"'$  (1.727 m)   Wt 220 lb (99.8 kg)   SpO2 97%  BMI 33.45 kg/m?   ? ? ?  09/22/2021  ?  2:00 PM 08/11/2021  ?  1:35 PM 06/30/2021  ?  1:34 PM  ?BP/Weight  ?Systolic BP  98 798  ?Diastolic BP  62 78  ?Wt. (Lbs) 220 219 216  ?BMI 33.45 kg/m2 34.3 kg/m2 33.83 kg/m2  ? ? ?Physical Exam ?Vitals reviewed.  ?Constitutional:   ?   Appearance: Normal appearance.  ?HENT:  ?   Head: Normocephalic.  ?   Right Ear: Tympanic membrane normal.  ?   Left Ear: Tympanic membrane normal.  ?   Nose: Nose normal.  ?   Mouth/Throat:  ?   Mouth: Mucous membranes are moist.  ?Eyes:  ?   Pupils: Pupils are equal, round, and reactive to light.  ?Cardiovascular:  ?   Rate and Rhythm: Normal rate and regular rhythm.  ?   Pulses: Normal pulses.  ?   Heart sounds: Normal heart sounds.  ?Pulmonary:  ?   Effort: Pulmonary effort is normal.  ?   Breath sounds: Normal breath sounds.  ?Abdominal:  ?   General: Bowel sounds are normal.  ?   Palpations: Abdomen is soft.  ?Musculoskeletal:     ?   General: Normal range of motion.  ?   Cervical back: Neck supple.  ?Skin: ?   General: Skin is warm and dry.  ?   Capillary Refill: Capillary refill takes less than 2 seconds.  ?Neurological:  ?   General: No focal deficit present.  ?   Mental Status: He is alert and oriented to person, place, and time.  ?Psychiatric:     ?   Mood and Affect: Mood normal.     ?   Behavior: Behavior normal.  ? ? ? ?  ? ?Lab Results  ?Component Value Date  ? WBC 5.2 08/10/2021  ? HGB 14.3 08/10/2021  ? HCT 41.4 08/10/2021  ? PLT 319 08/10/2021  ?  GLUCOSE 103 (H) 08/10/2021  ? CHOL 190 08/10/2021  ? TRIG 152 (H) 08/10/2021  ? HDL 28 (L) 08/10/2021  ? LDLDIRECT 152.5 10/05/2010  ? LDLCALC 134 (H) 08/10/2021  ? ALT 15 08/10/2021  ? AST 13 08/10/2021  ? NA 134 08/10/2021  ? K 4.6 02

## 2021-09-22 NOTE — Patient Instructions (Addendum)
Continue medications ?Follow-up 12/10/21 at 8:00 with Dr Tobie Poet, fasting ? ?Dementia ?Dementia is a condition that affects the way the brain works. It often affects memory and thinking. There are many types of dementia. Some types get worse with time and cannot be reversed. Some types of dementia include: ?Alzheimer's disease. This is the most common type. ?Vascular dementia. This type may happen due to a stroke. ?Lewy body dementia. This type may happen to people who have Parkinson's disease. ?Frontotemporal dementia. This type is caused by damage to nerve cells in certain parts of the brain. ?Some people may have more than one type. ?What are the causes? ?This condition is caused by damage to cells in the brain. Some causes that cannot be reversed include: ?Having a condition that affects the blood vessels of the brain, such as diabetes, heart disease, or blood vessel disease. ?Changes to genes. ?Some causes that can be reversed or slowed include: ?Injury to the brain. ?Certain medicines. ?Infection. ?Not having enough vitamin B12 in the body, or thyroid problems. ?A tumor, blood clot, or too much fluid in the brain. ?Certain diseases that cause your body's defense system (immune system) to attack healthy parts of the body. ?What are the signs or symptoms? ?Problems remembering events or people. ?Having trouble taking a bath or putting clothes on. ?Forgetting appointments. ?Forgetting to pay bills. ?Trouble planning and making meals. ?Having trouble speaking. ?Getting lost easily. ?Changes in behavior or mood. ?How is this treated? ?Treatment depends on the cause of the dementia. It might include: ?Taking medicines for symptoms or to help control or slow down the dementia. ?Treating the cause of your dementia. ?Your doctor can help you find support groups and other doctors who can help with your care. ?Follow these instructions at home: ?Medicines ?Take over-the-counter and prescription medicines only as told by your  doctor. ?Use a pill organizer to help you manage your medicines. ?Avoidtaking medicines for pain or for sleep. ?Lifestyle ?Make healthy choices: ?Be active as told by your doctor. ?Do not smoke or use any products that contain nicotine or tobacco. If you need help quitting, ask your doctor. ?Do not drink alcohol. ?When you get stressed, do something that will help you relax. Your doctor can give you tips. ?Spend time with other people. ?Make sure you get good sleep. To get good sleep: ?Try not to take naps during the day. ?Keep your bedroom dark and cool. ?In the few hours before you go to bed, try not to do any exercise. ?Do not have foods and drinks with caffeine at night. ?Eating and drinking ?Drink enough fluid to keep your pee (urine) pale yellow. ?Eat a healthy diet. ?General instructions ? ?Talk with your doctor to figure out: ?What you need help with. ?What your safety needs are. ?Ask your doctor if it is safe for you to drive. ?If told, wear a bracelet that tracks where you are or shows that you are a person with memory loss. ?Work with your family to make big decisions. ?Keep all follow-up visits. ?Where to find more information ?Alzheimer's Association: CapitalMile.co.nz ?[employer] on Aging: DVDEnthusiasts.nl ?World Health Organization: RoleLink.com.br ?Contact a doctor if: ?You have any new symptoms. ?Your symptoms get worse. ?You have problems with swallowing or choking. ?Get help right away if: ?You feel very sad, or feel that you want to harm yourself. ?Your family members are worried for your safety. ?Get help right away if you feel like you may hurt yourself or others, or have thoughts about  taking your own life. Go to your nearest emergency room or: ?Call your local emergency services (911 in the [place] ?Call the [place] at 4800476931 or 988 in the [place] This is open 24 hours a day. ?Text the Crisis Text Line at 548-611-5038. ?Summary ?Dementia often affects memory  and thinking. ?Some types of dementia get worse with time and cannot be reversed. ?Treatment for this condition depends on the cause. ?Talk with your doctor to figure out what you need help with. ?Your doctor can help you find support groups and other doctors who can help with your care. ?This information is not intended to replace advice given to you by your health care provider. Make sure you discuss any questions you have with your health care provider. ?Document Revised: 12/24/2020 Document Reviewed: 10/15/2019 ?Elsevier Patient Education ? [place]. ? ?

## 2021-10-02 ENCOUNTER — Other Ambulatory Visit: Payer: Self-pay | Admitting: Nurse Practitioner

## 2021-10-08 DIAGNOSIS — H353122 Nonexudative age-related macular degeneration, left eye, intermediate dry stage: Secondary | ICD-10-CM | POA: Diagnosis not present

## 2021-10-08 DIAGNOSIS — H353113 Nonexudative age-related macular degeneration, right eye, advanced atrophic without subfoveal involvement: Secondary | ICD-10-CM | POA: Diagnosis not present

## 2021-10-21 ENCOUNTER — Other Ambulatory Visit: Payer: Self-pay

## 2021-10-21 DIAGNOSIS — I25118 Atherosclerotic heart disease of native coronary artery with other forms of angina pectoris: Secondary | ICD-10-CM

## 2021-10-21 MED ORDER — NITROGLYCERIN 0.4 MG SL SUBL: 0.4000 mg | SUBLINGUAL_TABLET | SUBLINGUAL | 0 refills | Status: DC | PRN

## 2021-10-22 ENCOUNTER — Encounter: Payer: Self-pay | Admitting: Nurse Practitioner

## 2021-10-22 ENCOUNTER — Ambulatory Visit (INDEPENDENT_AMBULATORY_CARE_PROVIDER_SITE_OTHER): Payer: Medicare HMO | Admitting: Nurse Practitioner

## 2021-10-22 VITALS — BP 128/72 | HR 84 | Temp 97.4°F | Ht 68.0 in | Wt 217.0 lb

## 2021-10-22 DIAGNOSIS — R0602 Shortness of breath: Secondary | ICD-10-CM | POA: Diagnosis not present

## 2021-10-22 DIAGNOSIS — R0609 Other forms of dyspnea: Secondary | ICD-10-CM | POA: Diagnosis not present

## 2021-10-22 DIAGNOSIS — K219 Gastro-esophageal reflux disease without esophagitis: Secondary | ICD-10-CM | POA: Diagnosis not present

## 2021-10-22 DIAGNOSIS — R0601 Orthopnea: Secondary | ICD-10-CM | POA: Diagnosis not present

## 2021-10-22 DIAGNOSIS — Z8679 Personal history of other diseases of the circulatory system: Secondary | ICD-10-CM | POA: Diagnosis not present

## 2021-10-22 DIAGNOSIS — R0789 Other chest pain: Secondary | ICD-10-CM

## 2021-10-22 DIAGNOSIS — R079 Chest pain, unspecified: Secondary | ICD-10-CM | POA: Diagnosis not present

## 2021-10-22 MED ORDER — PANTOPRAZOLE SODIUM 40 MG PO TBEC: 40.0000 mg | DELAYED_RELEASE_TABLET | Freq: Every day | ORAL | 3 refills | Status: DC

## 2021-10-22 NOTE — Patient Instructions (Addendum)
Obtain chest x-ray and labs at [facility] ?Avoid foods that trigger GERD symptoms ?Begin Protonix 40 mg daily ?Follow-up in 2 weeks, or sooner pending labs and chest x-ray results ? ?Gastroesophageal Reflux Disease, Adult ? ?Gastroesophageal reflux (GER) happens when acid from the stomach flows up into the tube that connects the mouth and the stomach (esophagus). Normally, food travels down the esophagus and stays in the stomach to be digested. With GER, food and stomach acid sometimes move back up into the esophagus. ?You may have a disease called gastroesophageal reflux disease (GERD) if the reflux: ?Happens often. ?Causes frequent or very bad symptoms. ?Causes problems such as damage to the esophagus. ?When this happens, the esophagus becomes sore and swollen. Over time, GERD can make small holes (ulcers) in the lining of the esophagus. ?What are the causes? ?This condition is caused by a problem with the muscle between the esophagus and the stomach. When this muscle is weak or not normal, it does not close properly to keep food and acid from coming back up from the stomach. ?The muscle can be weak because of: ?Tobacco use. ?Pregnancy. ?Having a certain type of hernia (hiatal hernia). ?Alcohol use. ?Certain foods and drinks, such as coffee, chocolate, onions, and peppermint. ?What increases the risk? ?Being overweight. ?Having a disease that affects your connective tissue. ?Taking NSAIDs, such a ibuprofen. ?What are the signs or symptoms? ?Heartburn. ?Difficult or painful swallowing. ?The feeling of having a lump in the throat. ?A bitter taste in the mouth. ?Bad breath. ?Having a lot of saliva. ?Having an upset or bloated stomach. ?Burping. ?Chest pain. Different conditions can cause chest pain. Make sure you see your doctor if you have chest pain. ?Shortness of breath or wheezing. ?A long-term cough or a cough at night. ?Wearing away of the surface of teeth (tooth enamel). ?Weight loss. ?How is this  treated? ?Making changes to your diet. ?Taking medicine. ?Having surgery. ?Treatment will depend on how bad your symptoms are. ?Follow these instructions at home: ?Eating and drinking ? ?Follow a diet as told by your doctor. You may need to avoid foods and drinks such as: ?Coffee and tea, with or without caffeine. ?Drinks that contain alcohol. ?Energy drinks and sports drinks. ?Bubbly (carbonated) drinks or sodas. ?Chocolate and cocoa. ?Peppermint and mint flavorings. ?Garlic and onions. ?Horseradish. ?Spicy and acidic foods. These include peppers, chili powder, curry powder, vinegar, hot sauces, and BBQ sauce. ?Citrus fruit juices and citrus fruits, such as oranges, lemons, and limes. ?Tomato-based foods. These include red sauce, chili, salsa, and pizza with red sauce. ?Fried and fatty foods. These include donuts, french fries, potato chips, and high-fat dressings. ?High-fat meats. These include hot dogs, rib eye steak, sausage, ham, and bacon. ?High-fat dairy items, such as whole milk, butter, and cream cheese. ?Eat small meals often. Avoid eating large meals. ?Avoid drinking large amounts of liquid with your meals. ?Avoid eating meals during the 2-3 hours before bedtime. ?Avoid lying down right after you eat. ?Do not exercise right after you eat. ?Lifestyle ? ?Do not smoke or use any products that contain nicotine or tobacco. If you need help quitting, ask your doctor. ?Try to lower your stress. If you need help doing this, ask your doctor. ?If you are overweight, lose an amount of weight that is healthy for you. Ask your doctor about a safe weight loss goal. ?General instructions ?Pay attention to any changes in your symptoms. ?Take over-the-counter and prescription medicines only as told by your doctor. ?Do not  take aspirin, ibuprofen, or other NSAIDs unless your doctor says it is okay. ?Wear loose clothes. Do not wear anything tight around your waist. ?Raise (elevate) the head of your bed about 6 inches (15  cm). You may need to use a wedge to do this. ?Avoid bending over if this makes your symptoms worse. ?Keep all follow-up visits. ?Contact a doctor if: ?You have new symptoms. ?You lose weight and you do not know why. ?You have trouble swallowing or it hurts to swallow. ?You have wheezing or a cough that keeps happening. ?You have a hoarse voice. ?Your symptoms do not get better with treatment. ?Get help right away if: ?You have sudden pain in your arms, neck, jaw, teeth, or back. ?You suddenly feel sweaty, dizzy, or light-headed. ?You have chest pain or shortness of breath. ?You vomit and the vomit is green, yellow, or black, or it looks like blood or coffee grounds. ?You faint. ?Your poop (stool) is red, bloody, or black. ?You cannot swallow, drink, or eat. ?These symptoms may represent a serious problem that is an emergency. Do not wait to see if the symptoms will go away. Get medical help right away. Call your local emergency services (911 in the [place] Do not drive yourself to the hospital. ?Summary ?If a person has gastroesophageal reflux disease (GERD), food and stomach acid move back up into the esophagus and cause symptoms or problems such as damage to the esophagus. ?Treatment will depend on how bad your symptoms are. ?Follow a diet as told by your doctor. ?Take all medicines only as told by your doctor. ?This information is not intended to replace advice given to you by your health care provider. Make sure you discuss any questions you have with your health care provider. ?Document Revised: 12/10/2019 Document Reviewed: 12/10/2019 ?Elsevier Patient Education ? [place]. ? ?Food Choices for Gastroesophageal Reflux Disease, Adult ?When you have gastroesophageal reflux disease (GERD), the foods you eat and your eating habits are very important. Choosing the right foods can help ease your discomfort. Think about working with a food expert (dietitian) to help you make good choices. ?What are tips for  following this plan? ?Reading food labels ?Look for foods that are low in saturated fat. Foods that may help with your symptoms include: ?Foods that have less than 5% of daily value (DV) of fat. ?Foods that have 0 grams of trans fat. ?Cooking ?Do not fry your food. ?Cook your food by baking, steaming, grilling, or broiling. These are all methods that do not need a lot of fat for cooking. ?To add flavor, try to use herbs that are low in spice and acidity. ?Meal planning ? ?Choose healthy foods that are low in fat, such as: ?Fruits and vegetables. ?Whole grains. ?Low-fat dairy products. ?Lean meats, fish, and poultry. ?Eat small meals often instead of eating 3 large meals each day. Eat your meals slowly in a place where you are relaxed. Avoid bending over or lying down until 2-3 hours after eating. ?Limit high-fat foods such as fatty meats or fried foods. ?Limit your intake of fatty foods, such as oils, butter, and shortening. ?Avoid the following as told by your doctor: ?Foods that cause symptoms. These may be different for different people. Keep a food diary to keep track of foods that cause symptoms. ?Alcohol. ?Drinking a lot of liquid with meals. ?Eating meals during the 2-3 hours before bed. ?Lifestyle ?Stay at a healthy weight. Ask your doctor what weight is healthy for you. If  you need to lose weight, work with your doctor to do so safely. ?Exercise for at least 30 minutes on 5 or more days each week, or as told by your doctor. ?Wear loose-fitting clothes. ?Do not smoke or use any products that contain nicotine or tobacco. If you need help quitting, ask your doctor. ?Sleep with the head of your bed higher than your feet. Use a wedge under the mattress or blocks under the bed frame to raise the head of the bed. ?Chew sugar-free gum after meals. ?What foods should eat? ? ?Eat a healthy, well-balanced diet of fruits, vegetables, whole grains, low-fat dairy products, lean meats, fish, and poultry. Each person is  different. ?Foods that may cause symptoms in one person may not cause any symptoms in another person. Work with your doctor to find foods that are safe for you. ?The items listed above may not be a complete list

## 2021-10-22 NOTE — Progress Notes (Signed)
? ?Subjective:  ?Patient ID: Jeffery Turner, male    DOB: 05-30-1941  Age: 81 y.o. MRN: [id number] ? ?Chief Complaint  ?Patient presents with  ? Discomfort after eating  ? ? ?HPI ? [name] is an 81 year old Caucasian male that presents with post-prandial epigastric discomfort. States he feels early satiety, mild dysphagia, and epigastric fullness. Denies fever, weight loss, change in appetite or stools. Onset of symptoms was two days ago. Denies trying any treatments at home. Denies GERD. Has a significant cardiac history including MI, CABG, and CAD. He is followed by Dr Bettina Gavia, cardiologist. Pt has chronic dyspnea and orthopnea states he is a former smoker. Denies pedal edema or chest pain. Recently diagnosed with cerebral small vessel ischemia, prescribed Namenda. ?Current Outpatient Medications on File Prior to Visit  ?Medication Sig Dispense Refill  ? aspirin EC 81 MG tablet Take 1 tablet (81 mg total) by mouth daily. Swallow whole. 90 tablet 3  ? Calcium-Magnesium-Zinc (CAL-MAG-ZINC PO) Take 1 tablet by mouth daily.    ? COCONUT OIL PO Take 30 mLs by mouth daily.    ? ketoconazole (NIZORAL) 2 % cream Apply 1 application topically 2 (two) times daily. 15 g 0  ? levothyroxine (SYNTHROID) 25 MCG tablet Take 1 tablet (25 mcg total) by mouth daily before breakfast. 90 tablet 3  ? memantine (NAMENDA) 10 MG tablet Take 1 tablet (10 mg total) by mouth 2 (two) times daily. 180 tablet 1  ? Multiple Vitamin (MULTIVITAMIN WITH MINERALS) TABS tablet Take 1 tablet by mouth daily.    ? nitroGLYCERIN (NITROSTAT) 0.4 MG SL tablet Place 1 tablet (0.4 mg total) under the tongue every 5 (five) minutes as needed. If require 2, recommend call 911. 50 tablet 0  ? triamcinolone (KENALOG) 0.025 % ointment Apply 1 application topically 2 (two) times daily. 30 g 0  ? ?No current facility-administered medications on file prior to visit.  ? ?Past Medical History:  ?Diagnosis Date  ? Anemia   ? Angina pectoris ([place]) 02/06/2018  ? Angina, class III  ([place]) 02/06/2018  ? Anginal equivalent ([place]) 09/19/2019  ? CAD (coronary artery disease)   ? s/p stent to CFX 1997 2/2 MI s/p DES to [facility] and DES mLAD 07/14/10 2/2 [place] cath 07/14/10: dLAD 60%; CFX stent ok; OM1 30-40%; pRCA 30%; PDA 40%; EF 60%  ? Constant exophthalmos   ? right  ? Coronary artery disease of native artery of native heart with stable angina pectoris ([place]) 07/21/2010  ? Qualifier: Diagnosis of  By: Denny Peon, Tracy, [name]    ? Epistaxis 07/21/2010  ? Qualifier: Diagnosis of  By: Jorene Minors, Scott    ? Hearing loss   ? right eye - no hearing aids  ? Hyperlipidemia   ? MI (myocardial infarction) ([place])   ? Mixed hyperlipidemia 07/21/2010  ? Qualifier: Diagnosis of  By: Ronne Binning    ? Old myocardial infarction 07/21/2010  ? Qualifier: Diagnosis of  By: Ronne Binning    ? Osteoarthritis   ? OSTEOARTHRITIS 07/21/2010  ? Qualifier: Diagnosis of  By: Ronne Binning    ? PMR (polymyalgia rheumatica) ([place]) 04/03/2021  ? S/P CABG x 3 02/05/2020  ? Shortness of breath 07/21/2010  ? Qualifier: Diagnosis of  By: Jorene Minors, Scott    ? Thyroid disease   ? TOBACCO USE, QUIT 07/21/2010  ? Qualifier: Diagnosis of  By: Ronne Binning    ? Transient visual loss, right   ? wears glasses  ? UNSTABLE  ANGINA 07/21/2010  ? Qualifier: Diagnosis of  By: Ronne Binning    ? Upper respiratory tract infection due to COVID-19 virus 05/14/2021  ? ?Past Surgical History:  ?Procedure Laterality Date  ? CARDIAC CATHETERIZATION    ? CORONARY ARTERY BYPASS GRAFT N/A 02/05/2020  ? Procedure: CORONARY ARTERY BYPASS GRAFTING (CABG), ON PUMP, TIMES THREE, USING LEFT INTERNAL MAMMARY ARTERY TO LEFT ANTERIOR DESCENDING ARTERY, REVERSE SAPHENOUS VEIN GRAFT TO POSTERIOR DESCENDING ARTERY AND CIRCUMFLEX ARTERY;  Surgeon: Grace Isaac, MD;  Location: [facility];  Service: Open Heart Surgery;  Laterality: N/A;  ? ENDOVEIN HARVEST OF GREATER SAPHENOUS VEIN Right 02/05/2020  ? Procedure: ENDOVEIN HARVEST OF GREATER SAPHENOUS VEIN;   Surgeon: Grace Isaac, MD;  Location: [facility];  Service: Open Heart Surgery;  Laterality: Right;  ? LEFT HEART CATH AND CORONARY ANGIOGRAPHY N/A 01/25/2020  ? Procedure: LEFT HEART CATH AND CORONARY ANGIOGRAPHY;  Surgeon: Leonie Man, MD;  Location: [facility];  Service: Cardiovascular;  Laterality: N/A;  ? TEE WITHOUT CARDIOVERSION N/A 02/05/2020  ? Procedure: TRANSESOPHAGEAL ECHOCARDIOGRAM (TEE);  Surgeon: Grace Isaac, MD;  Location: [facility];  Service: Open Heart Surgery;  Laterality: N/A;  ? TONSILLECTOMY AND ADENOIDECTOMY    ? WISDOM TOOTH EXTRACTION    ?  ?Family History  ?Problem Relation Age of Onset  ? Coronary artery disease Other   ? Heart failure Mother   ? Alzheimer's disease Mother   ? ?Social History  ? ?Socioeconomic History  ? Marital status: Married  ?  Spouse name: Not on file  ? Number of children: Not on file  ? Years of education: Not on file  ? Highest education level: Not on file  ?Occupational History  ? Not on file  ?Tobacco Use  ? Smoking status: Former  ?  Packs/day: 1.50  ?  Years: 37.00  ?  Pack years: 55.50  ?  Types: Cigarettes  ?  Quit date: 05/2000  ?  Years since quitting: 21.4  ? Smokeless tobacco: Never  ?Vaping Use  ? Vaping Use: Never used  ?Substance and Sexual Activity  ? Alcohol use: No  ? Drug use: No  ? Sexual activity: Not Currently  ?Other Topics Concern  ? Not on file  ?Social History Narrative  ? He lives in [place] with family.  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  He quit tobacco in 1997 with approximately 30-pack-   ?  year history and denies alcohol or drug abuse.   ?     ? ?Social Determinants of Health  ? ?Financial Resource Strain: Not on file  ?Food Insecurity: Not on file  ?Transportation Needs: Not on file  ?Physical Activity: Not on file  ?Stress: Not on file  ?Social Connections: Not on file  ? ? ?Review of Systems  ?Constitutional:  Positive for fatigue. Negative for appetite change and fever.  ?HENT:  Negative for congestion, ear  pain, sinus pressure and sore throat.   ?Respiratory:  Positive for shortness of breath (orthopnea). Negative for cough, chest tightness and wheezing.   ?Cardiovascular:  Negative for chest pain and palpitations.  ?Gastrointestinal:  Positive for abdominal pain (Epigastric discomfort after eating). Negative for constipation, diarrhea, nausea and vomiting.  ?Genitourinary:  Negative for dysuria and hematuria.  ?Musculoskeletal:  Negative for arthralgias, back pain, joint swelling and myalgias.  ?Skin:  Negative for rash.  ?Allergic/Immunologic: Positive for environmental allergies.  ?Neurological:  Negative for dizziness, weakness and headaches.  ?  Psychiatric/Behavioral:  Positive for decreased concentration. Negative for dysphoric mood. The patient is not nervous/anxious.   ? ? ?Objective:  ?BP 128/72 (BP Location: Left Arm, Patient Position: Sitting)   Pulse 84   Temp (!) 97.4 ?F (36.3 ?C) (Temporal)   Ht '5\' 8"'$  (1.727 m)   Wt 217 lb (98.4 kg)   SpO2 97%   BMI 32.99 kg/m?  ? ? ?  10/22/2021  ? 11:02 AM 09/22/2021  ?  2:00 PM 08/11/2021  ?  1:35 PM  ?BP/Weight  ?Systolic BP 619 509 98  ?Diastolic BP 72 72 62  ?Wt. (Lbs) 217 220 219  ?BMI 32.99 kg/m2 33.45 kg/m2 34.3 kg/m2  ? ? ?Physical Exam ?Vitals reviewed.  ?Constitutional:   ?   Appearance: Normal appearance. He is normal weight.  ?Cardiovascular:  ?   Rate and Rhythm: Normal rate and regular rhythm.  ?   Pulses: Normal pulses.  ?   Heart sounds: Normal heart sounds.  ?Pulmonary:  ?   Comments: Tachypnea noted; diminished lung sounds in all fields ?Abdominal:  ?   General: Bowel sounds are normal.  ?   Palpations: Abdomen is soft.  ?Neurological:  ?   Mental Status: He is alert and oriented to person, place, and time.  ?Psychiatric:     ?   Mood and Affect: Mood normal.     ?   Behavior: Behavior normal.  ? ? ? ?  ? ?Lab Results  ?Component Value Date  ? WBC 5.2 08/10/2021  ? HGB 14.3 08/10/2021  ? HCT 41.4 08/10/2021  ? PLT 319 08/10/2021  ? GLUCOSE 103 (H)  08/10/2021  ? CHOL 190 08/10/2021  ? TRIG 152 (H) 08/10/2021  ? HDL 28 (L) 08/10/2021  ? LDLDIRECT 152.5 10/05/2010  ? LDLCALC 134 (H) 08/10/2021  ? ALT 15 08/10/2021  ? AST 13 08/10/2021  ? NA 134 08/10/2021  ?

## 2021-10-26 ENCOUNTER — Other Ambulatory Visit: Payer: Self-pay

## 2021-10-26 DIAGNOSIS — R0789 Other chest pain: Secondary | ICD-10-CM

## 2021-10-26 DIAGNOSIS — R0609 Other forms of dyspnea: Secondary | ICD-10-CM

## 2021-10-26 DIAGNOSIS — R0601 Orthopnea: Secondary | ICD-10-CM

## 2021-10-26 DIAGNOSIS — Z8679 Personal history of other diseases of the circulatory system: Secondary | ICD-10-CM

## 2021-10-30 ENCOUNTER — Telehealth: Payer: Self-pay

## 2021-10-30 NOTE — Telephone Encounter (Signed)
Called patient to schedule an AWV- unable to reach him by phone but have asked that he return my call or call the office to schedule.  Gentry Fitz, RN, [facility]

## 2021-11-04 ENCOUNTER — Ambulatory Visit: Payer: Medicare HMO | Admitting: Family Medicine

## 2021-11-04 NOTE — Progress Notes (Unsigned)
Subjective:  Patient ID: Jeffery Turner, male    DOB: 14-Oct-1940  Age: 81 y.o. MRN: [id number]  Chief Complaint  Patient presents with   Gastroesophageal Reflux    HPI  Jeffery Turner presents for f/u of GERD. He is accompanied by his spouse, Jeffery Turner.   GERD, Follow up:  The patient was last seen for GERD 2 weeks ago. Current treatment consist DU:KGURKYHC 40 mg QD He reports excellent compliance with treatment. He is not having side effects.  He is NOT experiencing belching and eructation, dysphagia, or midespigastric pain   Current Outpatient Medications on File Prior to Visit  Medication Sig Dispense Refill   aspirin EC 81 MG tablet Take 1 tablet (81 mg total) by mouth daily. Swallow whole. 90 tablet 3   Calcium-Magnesium-Zinc (CAL-MAG-ZINC PO) Take 1 tablet by mouth daily.     COCONUT OIL PO Take 30 mLs by mouth daily.     ketoconazole (NIZORAL) 2 % cream Apply 1 application topically 2 (two) times daily. 15 g 0   levothyroxine (SYNTHROID) 25 MCG tablet Take 1 tablet (25 mcg total) by mouth daily before breakfast. 90 tablet 3   memantine (NAMENDA) 10 MG tablet Take 1 tablet (10 mg total) by mouth 2 (two) times daily. 180 tablet 1   Multiple Vitamin (MULTIVITAMIN WITH MINERALS) TABS tablet Take 1 tablet by mouth daily.     nitroGLYCERIN (NITROSTAT) 0.4 MG SL tablet Place 1 tablet (0.4 mg total) under the tongue every 5 (five) minutes as needed. If require 2, recommend call 911. 50 tablet 0   pantoprazole (PROTONIX) 40 MG tablet Take 1 tablet (40 mg total) by mouth daily. 90 tablet 3   triamcinolone (KENALOG) 0.025 % ointment Apply 1 application topically 2 (two) times daily. 30 g 0   No current facility-administered medications on file prior to visit.   Past Medical History:  Diagnosis Date   Anemia    Angina pectoris ([place]) 02/06/2018   Angina, class III ([place]) 02/06/2018   Anginal equivalent ([place]) 09/19/2019   CAD (coronary artery disease)    s/p stent to CFX 1997 2/2 MI s/p DES to  [facility] and DES mLAD 07/14/10 2/2 [place] cath 07/14/10: dLAD 60%; CFX stent ok; OM1 30-40%; pRCA 30%; PDA 40%; EF 60%   Constant exophthalmos    right   Coronary artery disease of native artery of native heart with stable angina pectoris ([place]) 07/21/2010   Qualifier: Diagnosis of  By: Denny Peon, CMA, [name]     Epistaxis 07/21/2010   Qualifier: Diagnosis of  By: Jorene Minors, Scott     Hearing loss    right eye - no hearing aids   Hyperlipidemia    MI (myocardial infarction) ([place])    Mixed hyperlipidemia 07/21/2010   Qualifier: Diagnosis of  By: Orville Govern CMA, Carol     Old myocardial infarction 07/21/2010   Qualifier: Diagnosis of  By: Orville Govern CMA, Carol     Osteoarthritis    OSTEOARTHRITIS 07/21/2010   Qualifier: Diagnosis of  By: Orville Govern CMA, Carol     PMR (polymyalgia rheumatica) ([place]) 04/03/2021   S/P CABG x 3 02/05/2020   Shortness of breath 07/21/2010   Qualifier: Diagnosis of  By: Jorene Minors, Scott     Thyroid disease    TOBACCO USE, QUIT 07/21/2010   Qualifier: Diagnosis of  By: Orville Govern, CMA, Carol     Transient visual loss, right    wears glasses   UNSTABLE ANGINA 07/21/2010   Qualifier: Diagnosis of  By: Orville Govern,  CMA, Carol     Upper respiratory tract infection due to COVID-19 virus 05/14/2021   Past Surgical History:  Procedure Laterality Date   CARDIAC CATHETERIZATION     CORONARY ARTERY BYPASS GRAFT N/A 02/05/2020   Procedure: CORONARY ARTERY BYPASS GRAFTING (CABG), ON PUMP, TIMES THREE, USING LEFT INTERNAL MAMMARY ARTERY TO LEFT ANTERIOR DESCENDING ARTERY, REVERSE SAPHENOUS VEIN GRAFT TO POSTERIOR DESCENDING ARTERY AND CIRCUMFLEX ARTERY;  Surgeon: Grace Isaac, MD;  Location: [facility];  Service: Open Heart Surgery;  Laterality: N/A;   ENDOVEIN HARVEST OF GREATER SAPHENOUS VEIN Right 02/05/2020   Procedure: ENDOVEIN HARVEST OF GREATER SAPHENOUS VEIN;  Surgeon: Grace Isaac, MD;  Location: [facility];  Service: Open Heart Surgery;  Laterality: Right;   LEFT HEART CATH AND CORONARY  ANGIOGRAPHY N/A 01/25/2020   Procedure: LEFT HEART CATH AND CORONARY ANGIOGRAPHY;  Surgeon: Leonie Man, MD;  Location: [facility];  Service: Cardiovascular;  Laterality: N/A;   TEE WITHOUT CARDIOVERSION N/A 02/05/2020   Procedure: TRANSESOPHAGEAL ECHOCARDIOGRAM (TEE);  Surgeon: Grace Isaac, MD;  Location: [facility];  Service: Open Heart Surgery;  Laterality: N/A;   TONSILLECTOMY AND ADENOIDECTOMY     WISDOM TOOTH EXTRACTION      Family History  Problem Relation Age of Onset   Coronary artery disease Other    Heart failure Mother    Alzheimer's disease Mother    Social History   Socioeconomic History   Marital status: Married    Spouse name: Not on file   Number of children: Not on file   Years of education: Not on file   Highest education level: Not on file  Occupational History   Not on file  Tobacco Use   Smoking status: Former    Packs/day: 1.50    Years: 37.00    Pack years: 55.50    Types: Cigarettes    Quit date: 05/2000    Years since quitting: 21.4   Smokeless tobacco: Never  Vaping Use   Vaping Use: Never used  Substance and Sexual Activity   Alcohol use: No   Drug use: No   Sexual activity: Not Currently  Other Topics Concern   Not on file  Social History Narrative   He lives in [place] with family.  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         Social Determinants of Health   Financial Resource Strain: Not on file  Food Insecurity: Not on file  Transportation Needs: Not on file  Physical Activity: Not on file  Stress: Not on file  Social Connections: Not on file    Review of Systems  Constitutional:  Positive for fatigue. Negative for chills, diaphoresis and fever.  HENT:  Negative for congestion, ear pain and sore throat.   Respiratory:  Negative for cough.   Cardiovascular:  Negative for chest pain and leg swelling.   Gastrointestinal:  Negative for abdominal pain, constipation, diarrhea, nausea and vomiting.  Genitourinary:  Negative for dysuria and urgency.  Musculoskeletal:  Negative for arthralgias and myalgias.  Neurological:  Negative for dizziness and headaches.  Psychiatric/Behavioral:  Negative for dysphoric mood.     Objective:  BP 120/64   Pulse 80   Temp (!) 97.1 F (36.2 C)   Resp 18   Ht '5\' 7"'$  (1.702 m)   Wt 217 lb (98.4 kg)  BMI 33.99 kg/m       10/22/2021   11:02 AM 09/22/2021    2:00 PM 08/11/2021    1:35 PM  BP/Weight  Systolic BP 185 631 98  Diastolic BP 72 72 62  Wt. (Lbs) 217 220 219  BMI 32.99 kg/m2 33.45 kg/m2 34.3 kg/m2    Physical Exam      Lab Results  Component Value Date   WBC 5.2 08/10/2021   HGB 14.3 08/10/2021   HCT 41.4 08/10/2021   PLT 319 08/10/2021   GLUCOSE 103 (H) 08/10/2021   CHOL 190 08/10/2021   TRIG 152 (H) 08/10/2021   HDL 28 (L) 08/10/2021   LDLDIRECT 152.5 10/05/2010   LDLCALC 134 (H) 08/10/2021   ALT 15 08/10/2021   AST 13 08/10/2021   NA 134 08/10/2021   K 4.6 08/10/2021   CL 98 08/10/2021   CREATININE 1.10 08/10/2021   BUN 18 08/10/2021   CO2 23 08/10/2021   TSH 3.080 08/10/2021   INR 1.4 (H) 02/05/2020   HGBA1C 6.2 (H) 07/23/2020      Assessment & Plan:  1. Gastroesophageal reflux disease, unspecified whether esophagitis present-well controlled -continue Protonix 40 mg QD -continue to avoid foods that trigger GERD    Follow-up: 12/10/21 at 8:00, fasting with Dr Tobie Turner  An After Visit Summary was printed and given to the patient.  I, Rip Harbour, NP, have reviewed all documentation for this visit. The documentation on 11/09/21 for the exam, diagnosis, procedures, and orders are all accurate and complete.   Signed, Rip Harbour, NP [place] [phone]

## 2021-11-06 ENCOUNTER — Ambulatory Visit (INDEPENDENT_AMBULATORY_CARE_PROVIDER_SITE_OTHER): Payer: Medicare HMO | Admitting: Nurse Practitioner

## 2021-11-06 ENCOUNTER — Encounter: Payer: Self-pay | Admitting: Nurse Practitioner

## 2021-11-06 VITALS — BP 120/64 | HR 80 | Temp 97.1°F | Resp 18 | Ht 67.0 in | Wt 217.0 lb

## 2021-11-06 DIAGNOSIS — K219 Gastro-esophageal reflux disease without esophagitis: Secondary | ICD-10-CM

## 2021-11-13 DIAGNOSIS — D485 Neoplasm of uncertain behavior of skin: Secondary | ICD-10-CM | POA: Diagnosis not present

## 2021-12-09 NOTE — Progress Notes (Unsigned)
Subjective:  Patient ID: Jeffery Turner, male    DOB: 1940/08/18  Age: 81 y.o. MRN: [id number]  No chief complaint on file.   HPI Memory loss: Patient is taking Namenda 10 mg twice a day.  Hypothyroidism: He takes Levothyroxine 25 mcg daily.  GERD: Taking Pantoprazole 40 mg daily. Current Outpatient Medications on File Prior to Visit  Medication Sig Dispense Refill   aspirin EC 81 MG tablet Take 1 tablet (81 mg total) by mouth daily. Swallow whole. 90 tablet 3   Calcium-Magnesium-Zinc (CAL-MAG-ZINC PO) Take 1 tablet by mouth daily.     COCONUT OIL PO Take 30 mLs by mouth daily.     levothyroxine (SYNTHROID) 25 MCG tablet Take 1 tablet (25 mcg total) by mouth daily before breakfast. (Patient not taking: Reported on 11/06/2021) 90 tablet 3   memantine (NAMENDA) 10 MG tablet Take 1 tablet (10 mg total) by mouth 2 (two) times daily. 180 tablet 1   Multiple Vitamin (MULTIVITAMIN WITH MINERALS) TABS tablet Take 1 tablet by mouth daily.     nitroGLYCERIN (NITROSTAT) 0.4 MG SL tablet Place 1 tablet (0.4 mg total) under the tongue every 5 (five) minutes as needed. If require 2, recommend call 911. 50 tablet 0   pantoprazole (PROTONIX) 40 MG tablet Take 1 tablet (40 mg total) by mouth daily. 90 tablet 3   No current facility-administered medications on file prior to visit.   Past Medical History:  Diagnosis Date   Anemia    Angina pectoris ([place]) 02/06/2018   Angina, class III ([place]) 02/06/2018   Anginal equivalent ([place]) 09/19/2019   CAD (coronary artery disease)    s/p stent to CFX 1997 2/2 MI s/p DES to [facility] and DES mLAD 07/14/10 2/2 [place] cath 07/14/10: dLAD 60%; CFX stent ok; OM1 30-40%; pRCA 30%; PDA 40%; EF 60%   Constant exophthalmos    right   Coronary artery disease of native artery of native heart with stable angina pectoris ([place]) 07/21/2010   Qualifier: Diagnosis of  By: Denny Peon, CMA, [name]     Epistaxis 07/21/2010   Qualifier: Diagnosis of  By: Jorene Minors, Scott     Hearing loss     right eye - no hearing aids   Hyperlipidemia    MI (myocardial infarction) ([place])    Mixed hyperlipidemia 07/21/2010   Qualifier: Diagnosis of  By: Orville Govern, CMA, Carol     Old myocardial infarction 07/21/2010   Qualifier: Diagnosis of  By: Orville Govern CMA, Carol     Osteoarthritis    OSTEOARTHRITIS 07/21/2010   Qualifier: Diagnosis of  By: Orville Govern CMA, Carol     PMR (polymyalgia rheumatica) ([place]) 04/03/2021   S/P CABG x 3 02/05/2020   Shortness of breath 07/21/2010   Qualifier: Diagnosis of  By: Jorene Minors, Scott     Thyroid disease    TOBACCO USE, QUIT 07/21/2010   Qualifier: Diagnosis of  By: Orville Govern, CMA, Carol     Transient visual loss, right    wears glasses   UNSTABLE ANGINA 07/21/2010   Qualifier: Diagnosis of  By: Orville Govern, CMA, Carol     Upper respiratory tract infection due to COVID-19 virus 05/14/2021   Past Surgical History:  Procedure Laterality Date   CARDIAC CATHETERIZATION     CORONARY ARTERY BYPASS GRAFT N/A 02/05/2020   Procedure: CORONARY ARTERY BYPASS GRAFTING (CABG), ON PUMP, TIMES THREE, USING LEFT INTERNAL MAMMARY ARTERY TO LEFT ANTERIOR DESCENDING ARTERY, REVERSE SAPHENOUS VEIN GRAFT TO POSTERIOR DESCENDING ARTERY AND CIRCUMFLEX ARTERY;  Surgeon:  Grace Isaac, MD;  Location: [facility];  Service: Open Heart Surgery;  Laterality: N/A;   ENDOVEIN HARVEST OF GREATER SAPHENOUS VEIN Right 02/05/2020   Procedure: ENDOVEIN HARVEST OF GREATER SAPHENOUS VEIN;  Surgeon: Grace Isaac, MD;  Location: [place];  Service: Open Heart Surgery;  Laterality: Right;   LEFT HEART CATH AND CORONARY ANGIOGRAPHY N/A 01/25/2020   Procedure: LEFT HEART CATH AND CORONARY ANGIOGRAPHY;  Surgeon: Leonie Man, MD;  Location: [place] [facility];  Service: Cardiovascular;  Laterality: N/A;   TEE WITHOUT CARDIOVERSION N/A 02/05/2020   Procedure: TRANSESOPHAGEAL ECHOCARDIOGRAM (TEE);  Surgeon: Grace Isaac, MD;  Location: [facility];  Service: Open Heart Surgery;  Laterality: N/A;   TONSILLECTOMY  AND ADENOIDECTOMY     WISDOM TOOTH EXTRACTION      Family History  Problem Relation Age of Onset   Coronary artery disease Other    Heart failure Mother    Alzheimer's disease Mother    Social History   Socioeconomic History   Marital status: Married    Spouse name: Not on file   Number of children: Not on file   Years of education: Not on file   Highest education level: Not on file  Occupational History   Not on file  Tobacco Use   Smoking status: Former    Packs/day: 1.50    Years: 37.00    Total pack years: 55.50    Types: Cigarettes    Quit date: 05/2000    Years since quitting: 21.5   Smokeless tobacco: Never  Vaping Use   Vaping Use: Never used  Substance and Sexual Activity   Alcohol use: No   Drug use: No   Sexual activity: Not Currently  Other Topics Concern   Not on file  Social History Narrative   He lives in [place] with family.  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         Social Determinants of Health   Financial Resource Strain: Not on file  Food Insecurity: Not on file  Transportation Needs: Not on file  Physical Activity: Sufficiently Active (05/18/2020)   Exercise Vital Sign    Days of Exercise per Week: 5 days    Minutes of Exercise per Session: 30 min  Stress: Stress Concern Present (05/18/2020)   [place]    Feeling of Stress : To some extent  Social Connections: Unknown (05/18/2020)   Social Connection and Isolation Panel [NHANES]    Frequency of Communication with Friends and Family: Not on file    Frequency of Social Gatherings with Friends and Family: Not on file    Attends Religious Services: Not on file    Active Member of Clubs or Organizations: Not on file    Attends Archivist Meetings: Not on file    Marital Status: Married    Review of  Systems   Objective:  There were no vitals taken for this visit.     11/06/2021   11:00 AM 10/22/2021   11:02 AM 09/22/2021    2:00 PM  BP/Weight  Systolic BP 485 462 703  Diastolic BP 64 72 72  Wt. (Lbs) 217 217 220  BMI 33.99 kg/m2 32.99 kg/m2 33.45 kg/m2    Physical Exam  Diabetic Foot Exam - Simple   No data  filed      Lab Results  Component Value Date   WBC 5.2 08/10/2021   HGB 14.3 08/10/2021   HCT 41.4 08/10/2021   PLT 319 08/10/2021   GLUCOSE 103 (H) 08/10/2021   CHOL 190 08/10/2021   TRIG 152 (H) 08/10/2021   HDL 28 (L) 08/10/2021   LDLDIRECT 152.5 10/05/2010   LDLCALC 134 (H) 08/10/2021   ALT 15 08/10/2021   AST 13 08/10/2021   NA 134 08/10/2021   K 4.6 08/10/2021   CL 98 08/10/2021   CREATININE 1.10 08/10/2021   BUN 18 08/10/2021   CO2 23 08/10/2021   TSH 3.080 08/10/2021   INR 1.4 (H) 02/05/2020   HGBA1C 6.2 (H) 07/23/2020      Assessment & Plan:   Problem List Items Addressed This Visit       Cardiovascular and Mediastinum   Coronary artery disease of native artery of native heart with stable angina pectoris ([place]) - Primary (Chronic)   Hypertensive heart disease without heart failure     Endocrine   Secondary hypothyroidism     Other   Mixed hyperlipidemia   Memory loss   Prediabetes  .  No orders of the defined types were placed in this encounter.   No orders of the defined types were placed in this encounter.    Follow-up: No follow-ups on file.  An After Visit Summary was printed and given to the patient.  Rochel Brome, MD [facility] [phone]

## 2021-12-10 ENCOUNTER — Encounter: Payer: Self-pay | Admitting: Family Medicine

## 2021-12-10 ENCOUNTER — Ambulatory Visit (INDEPENDENT_AMBULATORY_CARE_PROVIDER_SITE_OTHER): Payer: Medicare HMO | Admitting: Family Medicine

## 2021-12-10 VITALS — BP 110/64 | HR 78 | Temp 98.1°F | Resp 14 | Ht 67.0 in | Wt 218.0 lb

## 2021-12-10 DIAGNOSIS — I119 Hypertensive heart disease without heart failure: Secondary | ICD-10-CM | POA: Diagnosis not present

## 2021-12-10 DIAGNOSIS — T466X5A Adverse effect of antihyperlipidemic and antiarteriosclerotic drugs, initial encounter: Secondary | ICD-10-CM

## 2021-12-10 DIAGNOSIS — I25118 Atherosclerotic heart disease of native coronary artery with other forms of angina pectoris: Secondary | ICD-10-CM | POA: Diagnosis not present

## 2021-12-10 DIAGNOSIS — R7303 Prediabetes: Secondary | ICD-10-CM | POA: Diagnosis not present

## 2021-12-10 DIAGNOSIS — E038 Other specified hypothyroidism: Secondary | ICD-10-CM

## 2021-12-10 DIAGNOSIS — G720 Drug-induced myopathy: Secondary | ICD-10-CM

## 2021-12-10 DIAGNOSIS — G309 Alzheimer's disease, unspecified: Secondary | ICD-10-CM | POA: Diagnosis not present

## 2021-12-10 DIAGNOSIS — F0280 Dementia in other diseases classified elsewhere without behavioral disturbance: Secondary | ICD-10-CM | POA: Diagnosis not present

## 2021-12-10 DIAGNOSIS — E782 Mixed hyperlipidemia: Secondary | ICD-10-CM

## 2021-12-10 DIAGNOSIS — E6609 Other obesity due to excess calories: Secondary | ICD-10-CM | POA: Diagnosis not present

## 2021-12-10 DIAGNOSIS — Z6834 Body mass index (BMI) 34.0-34.9, adult: Secondary | ICD-10-CM

## 2021-12-10 DIAGNOSIS — R413 Other amnesia: Secondary | ICD-10-CM

## 2021-12-10 MED ORDER — DONEPEZIL HCL 5 MG PO TABS: 5.0000 mg | ORAL_TABLET | Freq: Every day | ORAL | 0 refills | Status: DC

## 2021-12-10 NOTE — Assessment & Plan Note (Addendum)
Well controlled.  No changes to medicines. : NTG. Aspirin Continue to work on eating a healthy diet and exercise.  Labs drawn today.

## 2021-12-10 NOTE — Assessment & Plan Note (Signed)
Dementia: Start aricept 5 mg before bed. Continue namenda 10 mg twice daily.  Continue Namenda 10 mg twice daily.

## 2021-12-10 NOTE — Assessment & Plan Note (Signed)
Well controlled.  ?No changes to medicines.  ?Continue to work on eating a healthy diet and exercise.  ?Labs drawn today.  ?

## 2021-12-10 NOTE — Assessment & Plan Note (Signed)
Continue to work on healthy diet and exercise.   

## 2021-12-10 NOTE — Patient Instructions (Addendum)
Call [facility] in [place] and schedule an appointment with Dr. Bettina Gavia for follow-up of heart disease.  Dementia: Start aricept 5 mg before bed. Continue namenda 10 mg twice daily.

## 2021-12-10 NOTE — Assessment & Plan Note (Signed)
Call [facility] in [place] and schedule an appointment with Dr. Bettina Gavia for follow-up of heart disease.

## 2021-12-10 NOTE — Assessment & Plan Note (Signed)
Intolerant to statins.  Refuses alternatives.

## 2021-12-10 NOTE — Assessment & Plan Note (Signed)
Recommend continue to work on eating healthy diet and exercise.  

## 2021-12-10 NOTE — Assessment & Plan Note (Addendum)
Check labs.  Patient is to take Synthroid 25 mcg daily on a regular basis.

## 2021-12-11 LAB — COMPREHENSIVE METABOLIC PANEL
ALT: 13 IU/L (ref 0–44)
AST: 15 IU/L (ref 0–40)
Albumin/Globulin Ratio: 1.6 (ref 1.2–2.2)
Albumin: 4.1 g/dL (ref 3.7–4.7)
Alkaline Phosphatase: 57 IU/L (ref 44–121)
BUN/Creatinine Ratio: 20 (ref 10–24)
BUN: 19 mg/dL (ref 8–27)
Bilirubin Total: 0.4 mg/dL (ref 0.0–1.2)
CO2: 21 mmol/L (ref 20–29)
Calcium: 9 mg/dL (ref 8.6–10.2)
Chloride: 98 mmol/L (ref 96–106)
Creatinine, Ser: 0.96 mg/dL (ref 0.76–1.27)
Globulin, Total: 2.6 g/dL (ref 1.5–4.5)
Glucose: 100 mg/dL — ABNORMAL HIGH (ref 70–99)
Potassium: 4.7 mmol/L (ref 3.5–5.2)
Sodium: 133 mmol/L — ABNORMAL LOW (ref 134–144)
Total Protein: 6.7 g/dL (ref 6.0–8.5)
eGFR: 80 mL/min/{1.73_m2} (ref >59)

## 2021-12-11 LAB — CBC WITH DIFFERENTIAL/PLATELET
Basophils Absolute: 0 10*3/uL (ref 0.0–0.2)
Basos: 1 %
EOS (ABSOLUTE): 0.1 10*3/uL (ref 0.0–0.4)
Eos: 3 %
Hematocrit: 36.5 % — ABNORMAL LOW (ref 37.5–51.0)
Hemoglobin: 12.3 g/dL — ABNORMAL LOW (ref 13.0–17.7)
Immature Grans (Abs): 0 10*3/uL (ref 0.0–0.1)
Immature Granulocytes: 0 %
Lymphocytes Absolute: 1.2 10*3/uL (ref 0.7–3.1)
Lymphs: 24 %
MCH: 29.2 pg (ref 26.6–33.0)
MCHC: 33.7 g/dL (ref 31.5–35.7)
MCV: 87 fL (ref 79–97)
Monocytes Absolute: 0.7 10*3/uL (ref 0.1–0.9)
Monocytes: 14 %
Neutrophils Absolute: 3 10*3/uL (ref 1.4–7.0)
Neutrophils: 58 %
Platelets: 284 10*3/uL (ref 150–450)
RBC: 4.21 x10E6/uL (ref 4.14–5.80)
RDW: 12.1 % (ref 11.6–15.4)
WBC: 5.1 10*3/uL (ref 3.4–10.8)

## 2021-12-11 LAB — LIPID PANEL
Chol/HDL Ratio: 6.5 ratio — ABNORMAL HIGH (ref 0.0–5.0)
Cholesterol, Total: 182 mg/dL (ref 100–199)
HDL: 28 mg/dL — ABNORMAL LOW (ref >39)
LDL Chol Calc (NIH): 133 mg/dL — ABNORMAL HIGH (ref 0–99)
Triglycerides: 115 mg/dL (ref 0–149)
VLDL Cholesterol Cal: 21 mg/dL (ref 5–40)

## 2021-12-11 LAB — HEMOGLOBIN A1C
Est. average glucose Bld gHb Est-mCnc: 128 mg/dL
Hgb A1c MFr Bld: 6.1 % — ABNORMAL HIGH (ref 4.8–5.6)

## 2021-12-11 LAB — TSH: TSH: 2.88 u[IU]/mL (ref 0.450–4.500)

## 2021-12-11 LAB — CARDIOVASCULAR RISK ASSESSMENT

## 2021-12-11 NOTE — Progress Notes (Signed)
Blood count normal.  Liver function normal.  Kidney function normal.  Thyroid function normal.  Cholesterol: LDL 133.  Too high patient has heart disease.  Intolerant to statins.  I would recommend a statin as he has known heart disease. HBA1C: 6.1.  Well-controlled.

## 2022-01-13 ENCOUNTER — Ambulatory Visit: Payer: Medicare HMO | Admitting: Nurse Practitioner

## 2022-01-13 ENCOUNTER — Encounter: Payer: Self-pay | Admitting: Nurse Practitioner

## 2022-01-13 DIAGNOSIS — Z0000 Encounter for general adult medical examination without abnormal findings: Secondary | ICD-10-CM

## 2022-01-13 NOTE — Patient Instructions (Signed)

## 2022-01-13 NOTE — Progress Notes (Signed)
MEDICARE ANNUAL WELLNESS VISIT  01/13/2022  Telephone Visit Disclaimer This Medicare AWV was conducted by telephone due to national recommendations for restrictions regarding the COVID-19 Pandemic (e.g. social distancing).  I verified, using two identifiers, that I am speaking with Jeffery Turner or their authorized healthcare agent. I discussed the limitations, risks, security, and privacy concerns of performing an evaluation and management service by telephone and the potential availability of an in-person appointment in the future. The patient expressed understanding and agreed to proceed.  Location of Patient: home Location of Provider (nurse):  office   Subjective:    Jeffery Turner is a 81 y.o. male patient of Cox, Kirsten, MD who had a Medicare Annual Wellness Visit today via telephone. Lucy is Working full time and lives with their spouse. he has 3 children. he reports that he is socially active and does interact with friends/family regularly. he is minimally physically active and enjoys gardening playing on internet and yard work.  Patient Care Team: Rochel Brome, MD as PCP - General (Family Medicine) Richardo Priest, MD as PCP - Cardiology (Cardiology)     04/23/2020    1:40 PM 02/08/2020   12:00 PM 02/01/2020    9:51 AM 01/25/2020    8:28 AM  Advanced Directives  Does Patient Have a Medical Advance Directive? No No No No  Would patient like information on creating a medical advance directive?  Yes (MAU/Ambulatory/Procedural Areas - Information given) No - Patient declined Yes (MAU/Ambulatory/Procedural Areas - Information given)    Hospital Utilization Over the Past 12 Months: # of hospitalizations or ER visits: 0 # of surgeries: 0  Review of Systems    Patient reports that his overall health is better compared to last year.  General ROS: negative No complaints  Patient Reported Readings (BP, Pulse, CBG, Weight, etc) none  Pain Assessment  0/10 currently      Current Medications & Allergies (verified) Allergies as of 01/13/2022       Reactions   Bactrim [sulfamethoxazole-trimethoprim] Shortness Of Breath   Breathing problems.  Rt ear - hearing loss.    Codeine Nausea And Vomiting   Statins Other (See Comments)   Patient preference to not take statins/cholesterol meds        Medication List        Accurate as of January 13, 2022  9:10 AM. If you have any questions, ask your nurse or doctor.          aspirin EC 81 MG tablet Take 1 tablet (81 mg total) by mouth daily. Swallow whole.   CAL-MAG-ZINC PO Take 1 tablet by mouth daily.   COCONUT OIL PO Take 30 mLs by mouth daily.   donepezil 5 MG tablet Commonly known as: ARICEPT Take 1 tablet (5 mg total) by mouth at bedtime.   levothyroxine 25 MCG tablet Commonly known as: SYNTHROID Take 1 tablet (25 mcg total) by mouth daily before breakfast.   memantine 10 MG tablet Commonly known as: NAMENDA Take 1 tablet (10 mg total) by mouth 2 (two) times daily.   multivitamin with minerals Tabs tablet Take 1 tablet by mouth daily.   nitroGLYCERIN 0.4 MG SL tablet Commonly known as: NITROSTAT Place 1 tablet (0.4 mg total) under the tongue every 5 (five) minutes as needed. If require 2, recommend call 911.   pantoprazole 40 MG tablet Commonly known as: PROTONIX Take 1 tablet (40 mg total) by mouth daily.        History (reviewed):  Past Medical History:  Diagnosis Date   Anemia    Angina pectoris ([place]) 02/06/2018   Angina, class III ([place]) 02/06/2018   Anginal equivalent ([place]) 09/19/2019   CAD (coronary artery disease)    s/p stent to CFX 1997 2/2 MI s/p DES to [facility] and DES mLAD 07/14/10 2/2 [place] cath 07/14/10: dLAD 60%; CFX stent ok; OM1 30-40%; pRCA 30%; PDA 40%; EF 60%   Constant exophthalmos    right   Coronary artery disease of native artery of native heart with stable angina pectoris ([place]) 07/21/2010   Qualifier: Diagnosis of  By: Denny Peon, CMA, [name]     Epistaxis  07/21/2010   Qualifier: Diagnosis of  By: Jorene Minors, Scott     Hearing loss    right eye - no hearing aids   Hyperlipidemia    MI (myocardial infarction) ([place])    Mixed hyperlipidemia 07/21/2010   Qualifier: Diagnosis of  By: Orville Govern, CMA, Carol     Old myocardial infarction 07/21/2010   Qualifier: Diagnosis of  By: Orville Govern CMA, Carol     Osteoarthritis    OSTEOARTHRITIS 07/21/2010   Qualifier: Diagnosis of  By: Orville Govern CMA, Carol     PMR (polymyalgia rheumatica) ([place]) 04/03/2021   S/P CABG x 3 02/05/2020   Shortness of breath 07/21/2010   Qualifier: Diagnosis of  By: Jorene Minors, Scott     Thyroid disease    TOBACCO USE, QUIT 07/21/2010   Qualifier: Diagnosis of  By: Orville Govern, CMA, Carol     Transient visual loss, right    wears glasses   UNSTABLE ANGINA 07/21/2010   Qualifier: Diagnosis of  By: Orville Govern, CMA, Carol     Upper respiratory tract infection due to COVID-19 virus 05/14/2021   Past Surgical History:  Procedure Laterality Date   CARDIAC CATHETERIZATION     CORONARY ARTERY BYPASS GRAFT N/A 02/05/2020   Procedure: CORONARY ARTERY BYPASS GRAFTING (CABG), ON PUMP, TIMES THREE, USING LEFT INTERNAL MAMMARY ARTERY TO LEFT ANTERIOR DESCENDING ARTERY, REVERSE SAPHENOUS VEIN GRAFT TO POSTERIOR DESCENDING ARTERY AND CIRCUMFLEX ARTERY;  Surgeon: Grace Isaac, MD;  Location: [facility];  Service: Open Heart Surgery;  Laterality: N/A;   ENDOVEIN HARVEST OF GREATER SAPHENOUS VEIN Right 02/05/2020   Procedure: ENDOVEIN HARVEST OF GREATER SAPHENOUS VEIN;  Surgeon: Grace Isaac, MD;  Location: [place];  Service: Open Heart Surgery;  Laterality: Right;   LEFT HEART CATH AND CORONARY ANGIOGRAPHY N/A 01/25/2020   Procedure: LEFT HEART CATH AND CORONARY ANGIOGRAPHY;  Surgeon: Leonie Man, MD;  Location: [facility];  Service: Cardiovascular;  Laterality: N/A;   TEE WITHOUT CARDIOVERSION N/A 02/05/2020   Procedure: TRANSESOPHAGEAL ECHOCARDIOGRAM (TEE);  Surgeon: Grace Isaac, MD;   Location: [facility];  Service: Open Heart Surgery;  Laterality: N/A;   TONSILLECTOMY AND ADENOIDECTOMY     WISDOM TOOTH EXTRACTION     Family History  Problem Relation Age of Onset   Heart failure Mother    Alzheimer's disease Mother    Alzheimer's disease Sister    Coronary artery disease Other    Social History   Socioeconomic History   Marital status: Married    Spouse name: Not on file   Number of children: Not on file   Years of education: Not on file   Highest education level: Not on file  Occupational History   Not on file  Tobacco Use   Smoking status: Former    Packs/day: 1.50    Years: 37.00  Total pack years: 55.50    Types: Cigarettes    Quit date: 05/2000    Years since quitting: 21.6   Smokeless tobacco: Never  Vaping Use   Vaping Use: Never used  Substance and Sexual Activity   Alcohol use: No   Drug use: No   Sexual activity: Not Currently  Other Topics Concern   Not on file  Social History Narrative   He lives in [place] with family.  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         Social Determinants of Health   Financial Resource Strain: Not on file  Food Insecurity: Not on file  Transportation Needs: Not on file  Physical Activity: Sufficiently Active (05/18/2020)   Exercise Vital Sign    Days of Exercise per Week: 5 days    Minutes of Exercise per Session: 30 min  Stress: Stress Concern Present (05/18/2020)   [place]    Feeling of Stress : To some extent  Social Connections: Unknown (05/18/2020)   Social Connection and Isolation Panel [NHANES]    Frequency of Communication with Friends and Family: Not on file    Frequency of Social Gatherings with Friends and Family: Not on file    Attends Religious Services: Not on file    Active Member of Clubs or Organizations: Not on  file    Attends Archivist Meetings: Not on file    Marital Status: Married    Activities of Daily Living    01/13/2022    9:04 AM  In your present state of health, do you have any difficulty performing the following activities:  Hearing? 1  Comment wear hearing aids  Vision? 1  Comment reading glasses  Difficulty concentrating or making decisions? 1  Comment is on aricept and namenda  Walking or climbing stairs? 0  Dressing or bathing? 0  Doing errands, shopping? 0  Preparing Food and eating ? N  Using the Toilet? N  In the past six months, have you accidently leaked urine? N  Do you have problems with loss of bowel control? N  Managing your Medications? N  Managing your Finances? N  Housekeeping or managing your Housekeeping? N    Patient Education/ Literacy    Exercise Current Exercise Habits: Home exercise routine, Type of exercise: Other - see comments (stays active), Time (Minutes): 35, Frequency (Times/Week): 5, Weekly Exercise (Minutes/Week): 175, Intensity: Moderate, Exercise limited by: cardiac condition(s)  Diet Patient reports consuming 2 meals a day and 2 snack(s) a day Patient reports that his primary diet is: Regular Patient reports that she does have regular access to food.   Depression Screen    01/13/2022    9:04 AM 08/11/2021    1:36 PM 07/23/2020    7:42 AM 04/23/2020    1:41 PM 04/12/2020    9:04 PM  PHQ 2/9 Scores  PHQ - 2 Score 0 0 0 0 0     Fall Risk    01/13/2022    9:04 AM 09/22/2021    2:04 PM 03/13/2021    8:40 AM 07/23/2020    7:42 AM 04/23/2020    1:40 PM  Fall Risk   Falls in the past year? 0 0 0 0 0  Number falls in past yr: 0 0 0 0 0  Injury  with Fall? 1 0 0 0 0  Risk for fall due to :  No Fall Risks No Fall Risks    Follow up Falls evaluation completed;Falls prevention discussed Falls evaluation completed Falls evaluation completed       Objective:  Shane A Lipschutz seemed alert and oriented and he participated  appropriately during our telephone visit.  Blood Pressure Weight BMI  BP Readings from Last 3 Encounters:  12/10/21 110/64  11/06/21 120/64  10/22/21 128/72   Wt Readings from Last 3 Encounters:  12/10/21 218 lb (98.9 kg)  11/06/21 217 lb (98.4 kg)  10/22/21 217 lb (98.4 kg)   BMI Readings from Last 1 Encounters:  12/10/21 34.14 kg/m    *Unable to obtain current vital signs, weight, and BMI due to telephone visit type  Hearing/Vision  Mindy did not seem to have difficulty with hearing/understanding during the telephone conversation Reports that he has had a formal eye exam by an eye care professional within the past year Reports that he has had a formal hearing evaluation within the past year with hearing aids in place *Unable to fully assess hearing and vision during telephone visit type  Cognitive Function:    01/13/2022    9:08 AM  6CIT Screen  What Year? 0 points  What month? 0 points  What time? 0 points  Count back from 20 0 points  Months in reverse 0 points  Repeat phrase 0 points  Total Score 0 points   (Normal:0-7, Significant for Dysfunction: >8)  Normal Cognitive Function Screening: Yes   Immunization & Health Maintenance Record Immunization History  Administered Date(s) Administered   Janssen (J&J) SARS-COV-2 Vaccination 12/26/2019, 02/26/2020   Tdap 04/08/2015    Health Maintenance  Topic Date Due   Zoster Vaccines- Shingrix (1 of 2) Never done   Pneumonia Vaccine 40+ Years old (1 - PCV) Never done   COVID-19 Vaccine (3 - Booster for [employer] series) 04/22/2020   TETANUS/TDAP  04/07/2025   HPV VACCINES  Aged Out   INFLUENZA VACCINE  Discontinued       Assessment  This is a routine wellness examination for [employer].  Health Maintenance: Due or Overdue Health Maintenance Due  Topic Date Due   Zoster Vaccines- Shingrix (1 of 2) Never done   Pneumonia Vaccine 5+ Years old (1 - PCV) Never done   COVID-19 Vaccine (3 - Booster for [employer]  series) 04/22/2020    Alfreddie A Bauder does not need a referral for [employer] Assistance: Care Management:   no Social Work:    no Prescription Assistance:  no Nutrition/Diabetes Education:  no   Plan:  Personalized Goals  Goals Addressed             This Visit's Progress    Activity Tolerance Optimized       Evidence-based guidance:  Promote daily physical activity that improves functional ability, cognition and quality of life.  Encourage reduction in sedentary time.   Encourage optimal, safe functional mobility and self-care performance based on ability and tolerance.   Promote breathing and energy conservation techniques, such as pursed-lip breathing, preplanning and pacing of activity, balancing activity and rest.  Encourage participation in cardiac rehabilitation services.   Notes:      DIET - EAT MORE FRUITS AND VEGETABLES       He tries to eat fruits and veetables       Personalized Health Maintenance & Screening Recommendations  Pneumococcal vaccine  Influenza vaccine  Lung Cancer Screening  Recommended: not applicable (Low Dose CT Chest recommended if Age 58-80 years, 30 pack-year currently smoking OR have quit w/in past 15 years) Hepatitis C Screening recommended: no HIV Screening recommended: no  Advanced Directives: Written information was not prepared per patient's request. Patient already has advanced direstives  Referrals & Orders No orders of the defined types were placed in this encounter.   Follow-up Plan Follow-up with Cox, Kirsten, MD as planned Schedule 2 months    I have personally reviewed and noted the following in the patient's chart:   Medical and social history Use of alcohol, tobacco or illicit drugs  Current medications and supplements Functional ability and status Nutritional status Physical activity Advanced directives List of other physicians Hospitalizations, surgeries, and ER visits in previous 12 months Vitals Screenings  to include cognitive, depression, and falls Referrals and appointments  In addition, I have reviewed and discussed with Maximilian A Waldren certain preventive protocols, quality metrics, and best practice recommendations. A written personalized care plan for preventive services as well as general preventive health recommendations is available and can be mailed to the patient at his request.      Mary-Margaret Hassell Done  01/13/2022

## 2022-01-18 ENCOUNTER — Ambulatory Visit (INDEPENDENT_AMBULATORY_CARE_PROVIDER_SITE_OTHER): Payer: Medicare HMO | Admitting: Family Medicine

## 2022-01-18 ENCOUNTER — Encounter: Payer: Self-pay | Admitting: Family Medicine

## 2022-01-18 VITALS — BP 90/62 | HR 68 | Temp 97.6°F | Ht 67.0 in | Wt 217.0 lb

## 2022-01-18 DIAGNOSIS — R252 Cramp and spasm: Secondary | ICD-10-CM | POA: Diagnosis not present

## 2022-01-18 DIAGNOSIS — R4189 Other symptoms and signs involving cognitive functions and awareness: Secondary | ICD-10-CM | POA: Insufficient documentation

## 2022-01-18 DIAGNOSIS — J00 Acute nasopharyngitis [common cold]: Secondary | ICD-10-CM | POA: Diagnosis not present

## 2022-01-18 DIAGNOSIS — G301 Alzheimer's disease with late onset: Secondary | ICD-10-CM | POA: Diagnosis not present

## 2022-01-18 DIAGNOSIS — F02A0 Dementia in other diseases classified elsewhere, mild, without behavioral disturbance, psychotic disturbance, mood disturbance, and anxiety: Secondary | ICD-10-CM | POA: Diagnosis not present

## 2022-01-18 HISTORY — DX: Cramp and spasm: R25.2

## 2022-01-18 MED ORDER — DONEPEZIL HCL 10 MG PO TABS: 10.0000 mg | ORAL_TABLET | Freq: Every day | ORAL | 0 refills | Status: DC

## 2022-01-18 NOTE — Assessment & Plan Note (Signed)
Continue namenda and increase aricept to 10 mg before bed.  I reviewed supplement and I am unsure how it might react with other medicines. I cautioned against use.

## 2022-01-18 NOTE — Progress Notes (Signed)
Acute Office Visit  Subjective:    Patient ID: Jeffery Turner, male    DOB: 1941/06/01, 81 y.o.   MRN: [id number]  Chief Complaint  Patient presents with   Chest congestion    Little bit of sore throat    HPI Patient is in today for sore throat, chest congestion, nasal congestion x 2 days. No fever, chills.   Memory loss: on namenda twice daily. On aricept 5 mg before bed. Tolerating well. Would like to increase aricept to 10 mg before bed. Patient brought an herbal that is supposed to help with memory. Wanted me to review it.   Muscle cramps. Drinking plenty of water.     Past Medical History:  Diagnosis Date   Anemia    Angina pectoris ([place]) 02/06/2018   Angina, class III ([place]) 02/06/2018   Anginal equivalent ([place]) 09/19/2019   CAD (coronary artery disease)    s/p stent to CFX 1997 2/2 MI s/p DES to [facility] and DES mLAD 07/14/10 2/2 [place] cath 07/14/10: dLAD 60%; CFX stent ok; OM1 30-40%; pRCA 30%; PDA 40%; EF 60%   Constant exophthalmos    right   Coronary artery disease of native artery of native heart with stable angina pectoris ([place]) 07/21/2010   Qualifier: Diagnosis of  By: Denny Peon, CMA, [name]     Epistaxis 07/21/2010   Qualifier: Diagnosis of  By: Jorene Minors, Scott     Hearing loss    right eye - no hearing aids   Hyperlipidemia    MI (myocardial infarction) ([place])    Mixed hyperlipidemia 07/21/2010   Qualifier: Diagnosis of  By: Orville Govern, CMA, Carol     Old myocardial infarction 07/21/2010   Qualifier: Diagnosis of  By: Orville Govern CMA, Carol     Osteoarthritis    OSTEOARTHRITIS 07/21/2010   Qualifier: Diagnosis of  By: Orville Govern CMA, Carol     PMR (polymyalgia rheumatica) ([place]) 04/03/2021   S/P CABG x 3 02/05/2020   Shortness of breath 07/21/2010   Qualifier: Diagnosis of  By: Jorene Minors, Scott     Thyroid disease    TOBACCO USE, QUIT 07/21/2010   Qualifier: Diagnosis of  By: Orville Govern, CMA, Carol     Transient visual loss, right    wears glasses   UNSTABLE ANGINA  07/21/2010   Qualifier: Diagnosis of  By: Orville Govern, CMA, Carol     Upper respiratory tract infection due to COVID-19 virus 05/14/2021    Past Surgical History:  Procedure Laterality Date   CARDIAC CATHETERIZATION     CORONARY ARTERY BYPASS GRAFT N/A 02/05/2020   Procedure: CORONARY ARTERY BYPASS GRAFTING (CABG), ON PUMP, TIMES THREE, USING LEFT INTERNAL MAMMARY ARTERY TO LEFT ANTERIOR DESCENDING ARTERY, REVERSE SAPHENOUS VEIN GRAFT TO POSTERIOR DESCENDING ARTERY AND CIRCUMFLEX ARTERY;  Surgeon: Grace Isaac, MD;  Location: [facility];  Service: Open Heart Surgery;  Laterality: N/A;   ENDOVEIN HARVEST OF GREATER SAPHENOUS VEIN Right 02/05/2020   Procedure: ENDOVEIN HARVEST OF GREATER SAPHENOUS VEIN;  Surgeon: Grace Isaac, MD;  Location: [facility];  Service: Open Heart Surgery;  Laterality: Right;   LEFT HEART CATH AND CORONARY ANGIOGRAPHY N/A 01/25/2020   Procedure: LEFT HEART CATH AND CORONARY ANGIOGRAPHY;  Surgeon: Leonie Man, MD;  Location: [facility];  Service: Cardiovascular;  Laterality: N/A;   TEE WITHOUT CARDIOVERSION N/A 02/05/2020   Procedure: TRANSESOPHAGEAL ECHOCARDIOGRAM (TEE);  Surgeon: Grace Isaac, MD;  Location: [facility];  Service: Open Heart Surgery;  Laterality: N/A;   TONSILLECTOMY  AND ADENOIDECTOMY     WISDOM TOOTH EXTRACTION      Family History  Problem Relation Age of Onset   Heart failure Mother    Alzheimer's disease Mother    Alzheimer's disease Sister    Coronary artery disease Other     Social History   Socioeconomic History   Marital status: Married    Spouse name: Not on file   Number of children: Not on file   Years of education: Not on file   Highest education level: Not on file  Occupational History   Not on file  Tobacco Use   Smoking status: Former    Packs/day: 1.50    Years: 37.00    Total pack years: 55.50    Types: Cigarettes    Quit date: 05/2000    Years since quitting: 21.6   Smokeless tobacco: Never  Vaping Use    Vaping Use: Never used  Substance and Sexual Activity   Alcohol use: No   Drug use: No   Sexual activity: Not Currently  Other Topics Concern   Not on file  Social History Narrative   He lives in [place] with family.  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         Social Determinants of Health   Financial Resource Strain: Not on file  Food Insecurity: Not on file  Transportation Needs: Not on file  Physical Activity: Sufficiently Active (05/18/2020)   Exercise Vital Sign    Days of Exercise per Week: 5 days    Minutes of Exercise per Session: 30 min  Stress: Stress Concern Present (05/18/2020)   [place]    Feeling of Stress : To some extent  Social Connections: Unknown (05/18/2020)   Social Connection and Isolation Panel [NHANES]    Frequency of Communication with Friends and Family: Not on file    Frequency of Social Gatherings with Friends and Family: Not on file    Attends Religious Services: Not on file    Active Member of Clubs or Organizations: Not on file    Attends Archivist Meetings: Not on file    Marital Status: Married  Intimate Partner Violence: Not on file    Outpatient Medications Prior to Visit  Medication Sig Dispense Refill   aspirin EC 81 MG tablet Take 1 tablet (81 mg total) by mouth daily. Swallow whole. 90 tablet 3   Calcium-Magnesium-Zinc (CAL-MAG-ZINC PO) Take 1 tablet by mouth daily.     COCONUT OIL PO Take 30 mLs by mouth daily.     levothyroxine (SYNTHROID) 25 MCG tablet Take 1 tablet (25 mcg total) by mouth daily before breakfast. 90 tablet 3   memantine (NAMENDA) 10 MG tablet Take 1 tablet (10 mg total) by mouth 2 (two) times daily. 180 tablet 1   Multiple Vitamin (MULTIVITAMIN WITH MINERALS) TABS tablet Take 1 tablet by mouth daily.     nitroGLYCERIN (NITROSTAT) 0.4  MG SL tablet Place 1 tablet (0.4 mg total) under the tongue every 5 (five) minutes as needed. If require 2, recommend call 911. 50 tablet 0   pantoprazole (PROTONIX) 40 MG tablet Take 1 tablet (40 mg total) by mouth daily. 90 tablet 3   donepezil (ARICEPT) 5 MG tablet Take 1 tablet (5 mg total) by mouth at bedtime. 90 tablet 0  No facility-administered medications prior to visit.    Allergies  Allergen Reactions   Bactrim [Sulfamethoxazole-Trimethoprim] Shortness Of Breath    Breathing problems.  Rt ear - hearing loss.    Codeine Nausea And Vomiting   Statins Other (See Comments)    Patient preference to not take statins/cholesterol meds    Review of Systems  Constitutional:  Negative for appetite change, fatigue and fever.  HENT:  Positive for congestion (Chest) and sore throat. Negative for ear pain and sinus pressure.   Respiratory:  Negative for cough, chest tightness, shortness of breath and wheezing.   Cardiovascular:  Negative for chest pain and palpitations.  Gastrointestinal:  Negative for abdominal pain, constipation, diarrhea, nausea and vomiting.  Genitourinary:  Negative for dysuria and hematuria.  Musculoskeletal:  Negative for arthralgias, back pain, joint swelling and myalgias.  Skin:  Negative for rash.  Neurological:  Negative for dizziness, weakness and headaches.  Psychiatric/Behavioral:  Negative for dysphoric mood. The patient is not nervous/anxious.        Objective:    Physical Exam Vitals reviewed.  Constitutional:      Appearance: Normal appearance. He is normal weight.  HENT:     Right Ear: Tympanic membrane normal.     Left Ear: Tympanic membrane normal.     Nose: Congestion present. No rhinorrhea.     Mouth/Throat:     Pharynx: No oropharyngeal exudate or posterior oropharyngeal erythema.  Cardiovascular:     Rate and Rhythm: Normal rate and regular rhythm.     Heart sounds: Normal heart sounds.  Pulmonary:     Effort: Pulmonary effort is  normal.     Breath sounds: Normal breath sounds.  Abdominal:     General: Abdomen is flat. Bowel sounds are normal.     Palpations: Abdomen is soft.  Neurological:     Mental Status: He is alert and oriented to person, place, and time.  Psychiatric:        Mood and Affect: Mood normal.        Behavior: Behavior normal.     BP 90/62 (BP Location: Left Arm, Patient Position: Sitting)   Pulse 68   Temp 97.6 F (36.4 C) (Oral)   Ht 5' 7"  (1.702 m)   Wt 217 lb (98.4 kg)   SpO2 95%   BMI 33.99 kg/m  Wt Readings from Last 3 Encounters:  01/18/22 217 lb (98.4 kg)  12/10/21 218 lb (98.9 kg)  11/06/21 217 lb (98.4 kg)    Health Maintenance Due  Topic Date Due   Zoster Vaccines- Shingrix (1 of 2) Never done   Pneumonia Vaccine 52+ Years old (1 - PCV) Never done   COVID-19 Vaccine (3 - Booster for Janssen series) 04/22/2020    There are no preventive care reminders to display for this patient.   Lab Results  Component Value Date   TSH 2.880 12/10/2021   Lab Results  Component Value Date   WBC 5.1 12/10/2021   HGB 12.3 (L) 12/10/2021   HCT 36.5 (L) 12/10/2021   MCV 87 12/10/2021   PLT 284 12/10/2021   Lab Results  Component Value Date   NA 133 (L) 12/10/2021   K 4.7 12/10/2021   CO2 21 12/10/2021   GLUCOSE 100 (H) 12/10/2021   BUN 19 12/10/2021   CREATININE 0.96 12/10/2021   BILITOT 0.4 12/10/2021   ALKPHOS 57 12/10/2021   AST 15 12/10/2021   ALT 13 12/10/2021   PROT 6.7 12/10/2021   ALBUMIN 4.1  12/10/2021   CALCIUM 9.0 12/10/2021   ANIONGAP 11 02/10/2020   EGFR 80 12/10/2021   Lab Results  Component Value Date   CHOL 182 12/10/2021   Lab Results  Component Value Date   HDL 28 (L) 12/10/2021   Lab Results  Component Value Date   LDLCALC 133 (H) 12/10/2021   Lab Results  Component Value Date   TRIG 115 12/10/2021   Lab Results  Component Value Date   CHOLHDL 6.5 (H) 12/10/2021   Lab Results  Component Value Date   HGBA1C 6.1 (H) 12/10/2021          Assessment & Plan:   Problem List Items Addressed This Visit       Respiratory   Acute nasopharyngitis - Primary    Recommend mucinex or mucinex dm.         Nervous and Auditory   Mild late onset Alzheimer's dementia without behavioral disturbance, psychotic disturbance, mood disturbance, or anxiety (HCC)    Continue namenda and increase aricept to 10 mg before bed.  I reviewed supplement and I am unsure how it might react with other medicines. I cautioned against use.       Relevant Medications   donepezil (ARICEPT) 10 MG tablet     Other   Muscle cramp    Recommend hyland cramps       Meds ordered this encounter  Medications   donepezil (ARICEPT) 10 MG tablet    Sig: Take 1 tablet (10 mg total) by mouth at bedtime.    Dispense:  90 tablet    Refill:  0   Follow up: as needed if not improving towards the end of the week.   Rochel Brome, MD

## 2022-01-18 NOTE — Assessment & Plan Note (Signed)
Recommend mucinex or mucinex dm.

## 2022-01-18 NOTE — Patient Instructions (Signed)
For muscle cramps: try hyland's cramps. May also try tonic water.   Try mucinex for cough.   Increase aricept 10 mg before bed.

## 2022-01-18 NOTE — Assessment & Plan Note (Signed)
Recommend hyland cramps

## 2022-02-10 ENCOUNTER — Other Ambulatory Visit: Payer: Self-pay | Admitting: Family Medicine

## 2022-02-10 DIAGNOSIS — R413 Other amnesia: Secondary | ICD-10-CM

## 2022-03-15 ENCOUNTER — Other Ambulatory Visit: Payer: Medicare HMO

## 2022-03-15 DIAGNOSIS — E038 Other specified hypothyroidism: Secondary | ICD-10-CM

## 2022-03-15 DIAGNOSIS — I119 Hypertensive heart disease without heart failure: Secondary | ICD-10-CM | POA: Diagnosis not present

## 2022-03-15 DIAGNOSIS — E782 Mixed hyperlipidemia: Secondary | ICD-10-CM

## 2022-03-15 DIAGNOSIS — R7303 Prediabetes: Secondary | ICD-10-CM

## 2022-03-16 ENCOUNTER — Ambulatory Visit (INDEPENDENT_AMBULATORY_CARE_PROVIDER_SITE_OTHER): Payer: Medicare HMO | Admitting: Family Medicine

## 2022-03-16 VITALS — BP 120/64 | HR 84 | Temp 97.0°F | Resp 18 | Ht 67.0 in | Wt 215.0 lb

## 2022-03-16 DIAGNOSIS — F02A0 Dementia in other diseases classified elsewhere, mild, without behavioral disturbance, psychotic disturbance, mood disturbance, and anxiety: Secondary | ICD-10-CM | POA: Diagnosis not present

## 2022-03-16 DIAGNOSIS — I25118 Atherosclerotic heart disease of native coronary artery with other forms of angina pectoris: Secondary | ICD-10-CM

## 2022-03-16 DIAGNOSIS — T466X5A Adverse effect of antihyperlipidemic and antiarteriosclerotic drugs, initial encounter: Secondary | ICD-10-CM

## 2022-03-16 DIAGNOSIS — G301 Alzheimer's disease with late onset: Secondary | ICD-10-CM

## 2022-03-16 DIAGNOSIS — F02B0 Dementia in other diseases classified elsewhere, moderate, without behavioral disturbance, psychotic disturbance, mood disturbance, and anxiety: Secondary | ICD-10-CM | POA: Diagnosis not present

## 2022-03-16 DIAGNOSIS — M791 Myalgia, unspecified site: Secondary | ICD-10-CM

## 2022-03-16 DIAGNOSIS — R252 Cramp and spasm: Secondary | ICD-10-CM

## 2022-03-16 DIAGNOSIS — E782 Mixed hyperlipidemia: Secondary | ICD-10-CM | POA: Diagnosis not present

## 2022-03-16 LAB — CBC WITH DIFF/PLATELET
Basophils Absolute: 0 10*3/uL (ref 0.0–0.2)
Basos: 1 %
EOS (ABSOLUTE): 0.2 10*3/uL (ref 0.0–0.4)
Eos: 4 %
Hematocrit: 38.2 % (ref 37.5–51.0)
Hemoglobin: 12.8 g/dL — ABNORMAL LOW (ref 13.0–17.7)
Immature Grans (Abs): 0 10*3/uL (ref 0.0–0.1)
Immature Granulocytes: 0 %
Lymphocytes Absolute: 1.3 10*3/uL (ref 0.7–3.1)
Lymphs: 29 %
MCH: 29.2 pg (ref 26.6–33.0)
MCHC: 33.5 g/dL (ref 31.5–35.7)
MCV: 87 fL (ref 79–97)
Monocytes Absolute: 0.7 10*3/uL (ref 0.1–0.9)
Monocytes: 14 %
Neutrophils Absolute: 2.4 10*3/uL (ref 1.4–7.0)
Neutrophils: 52 %
Platelets: 320 10*3/uL (ref 150–450)
RBC: 4.38 x10E6/uL (ref 4.14–5.80)
RDW: 13.2 % (ref 11.6–15.4)
WBC: 4.6 10*3/uL (ref 3.4–10.8)

## 2022-03-16 LAB — COMPREHENSIVE METABOLIC PANEL
ALT: 12 IU/L (ref 0–44)
AST: 12 IU/L (ref 0–40)
Albumin/Globulin Ratio: 1.7 (ref 1.2–2.2)
Albumin: 4.2 g/dL (ref 3.8–4.8)
Alkaline Phosphatase: 60 IU/L (ref 44–121)
BUN/Creatinine Ratio: 18 (ref 10–24)
BUN: 17 mg/dL (ref 8–27)
Bilirubin Total: 0.5 mg/dL (ref 0.0–1.2)
CO2: 23 mmol/L (ref 20–29)
Calcium: 9.3 mg/dL (ref 8.6–10.2)
Chloride: 99 mmol/L (ref 96–106)
Creatinine, Ser: 0.94 mg/dL (ref 0.76–1.27)
Globulin, Total: 2.5 g/dL (ref 1.5–4.5)
Glucose: 103 mg/dL — ABNORMAL HIGH (ref 70–99)
Potassium: 4.4 mmol/L (ref 3.5–5.2)
Sodium: 136 mmol/L (ref 134–144)
Total Protein: 6.7 g/dL (ref 6.0–8.5)
eGFR: 82 mL/min/{1.73_m2} (ref >59)

## 2022-03-16 LAB — LIPID PANEL
Chol/HDL Ratio: 7.3 ratio — ABNORMAL HIGH (ref 0.0–5.0)
Cholesterol, Total: 197 mg/dL (ref 100–199)
HDL: 27 mg/dL — ABNORMAL LOW (ref >39)
LDL Chol Calc (NIH): 138 mg/dL — ABNORMAL HIGH (ref 0–99)
Triglycerides: 174 mg/dL — ABNORMAL HIGH (ref 0–149)
VLDL Cholesterol Cal: 32 mg/dL (ref 5–40)

## 2022-03-16 LAB — HEMOGLOBIN A1C
Est. average glucose Bld gHb Est-mCnc: 126 mg/dL
Hgb A1c MFr Bld: 6 % — ABNORMAL HIGH (ref 4.8–5.6)

## 2022-03-16 LAB — CARDIOVASCULAR RISK ASSESSMENT

## 2022-03-16 LAB — TSH: TSH: 1.23 u[IU]/mL (ref 0.450–4.500)

## 2022-03-16 IMAGING — MR MR HEAD WO/W CM
15 series · 48 of 48 positions shown · IV contrast (20 ml Multihance)
Comparison: MRI of the brain September 26, 2015.

CLINICAL DATA: Memory loss QJI.0 (QZX-GW-CM). Mental status change,
unknown cause.

EXAM:
MRI HEAD WITHOUT AND WITH CONTRAST
TECHNIQUE: Multiplanar, multiecho pulse sequences of the brain and surrounding
structures were obtained without and with intravenous contrast.
CONTRAST:  20mL MULTIHANCE GADOBENATE DIMEGLUMINE 529 MG/ML IV SOLN

[Series 2: T1 · sagittal · 5.0mm · 0.47mm/px · 2 of 25 slices shown]
[im 1/25]
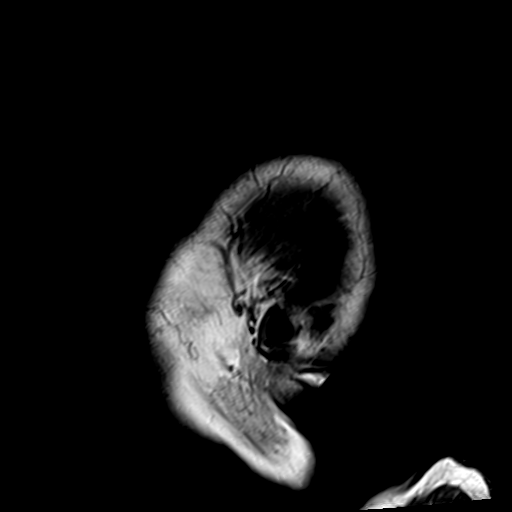
[im 25/25]
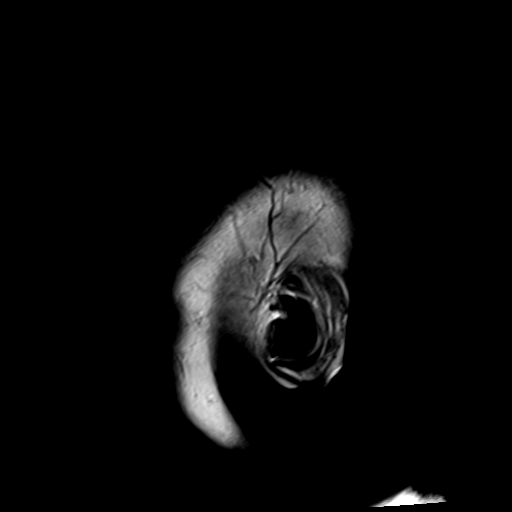

[Series 3: ax ep2d_diff_3 · axial · 3.0mm · 1.80mm/px · z∈[-3,+148]mm · 5 of 107 slices shown (1 of 2)]
[im 1/107]
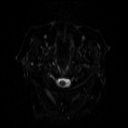
[im 27/107]
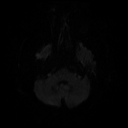
[im 54/107]
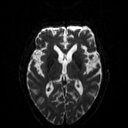
[im 80/107]
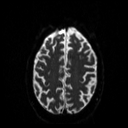
[im 107/107]
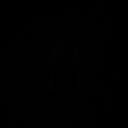

[Series 4: ax ep2d_diff_3_adc · axial · 3.0mm · 1.80mm/px · z∈[-3,+148]mm · 3 of 54 slices shown (1 of 2)]
[im 1/54]
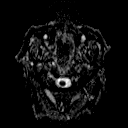
[im 27/54]
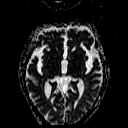
[im 54/54]
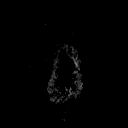

[Series 5: cor ep2d_diff · coronal · 5.0mm · 1.77mm/px · 3 of 59 slices shown]
[im 1/59]
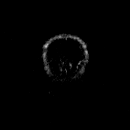
[im 30/59]
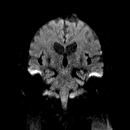
[im 59/59]
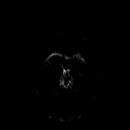

[Series 6: cor ep2d_diff_adc · coronal · 5.0mm · 1.77mm/px · 1 of 30 slices shown]
[im 1/30]
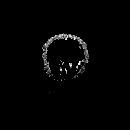

[Series 7: ax ep2d_diff_3 · axial · 3.0mm · 1.88mm/px · z∈[-3,+147]mm · 5 of 107 slices shown (2 of 2)]
[im 1/107]
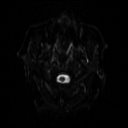
[im 27/107]
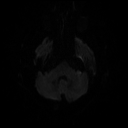
[im 54/107]
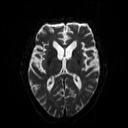
[im 80/107]
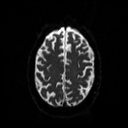
[im 107/107]
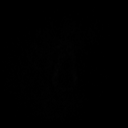

[Series 8: ax ep2d_diff_3_adc · axial · 3.0mm · 1.88mm/px · z∈[-3,+147]mm · 3 of 55 slices shown (2 of 2)]
[im 1/55]
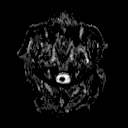
[im 28/55]
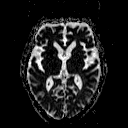
[im 55/55]
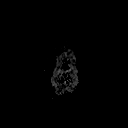

[Series 9: mip_images(sw) · axial · 16.0mm · 0.98mm/px · z∈[+8,+142]mm · 3 of 73 slices shown]
[im 1/73]
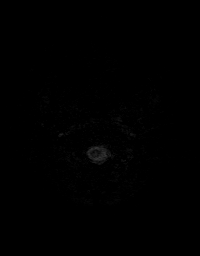
[im 37/73]
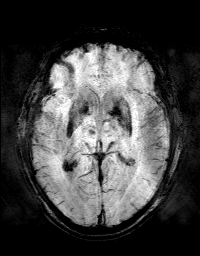
[im 73/73]
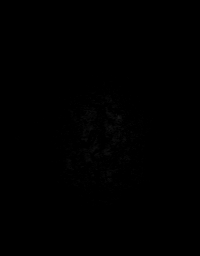

[Series 10: swi_images · axial · 2.0mm · 0.98mm/px · z∈[+1,+148]mm · 4 of 80 slices shown]
[im 1/80]
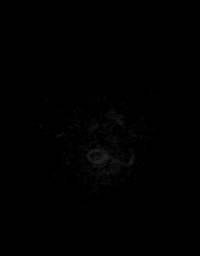
[im 27/80]
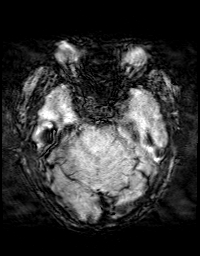
[im 53/80]
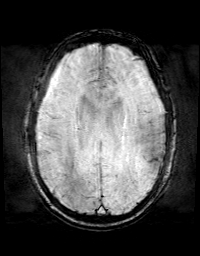
[im 80/80]
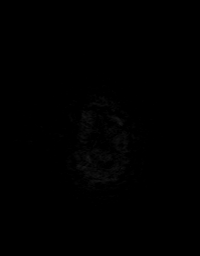

[Series 11: FLAIR · axial · 3.0mm · 0.43mm/px · z∈[-2,+139]mm · 2 of 40 slices shown]
[im 1/40]
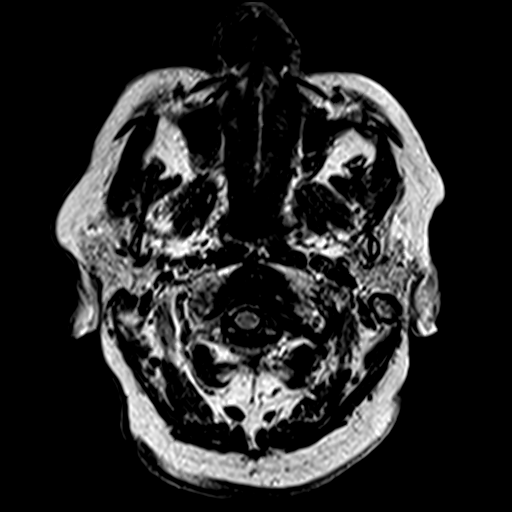
[im 40/40]
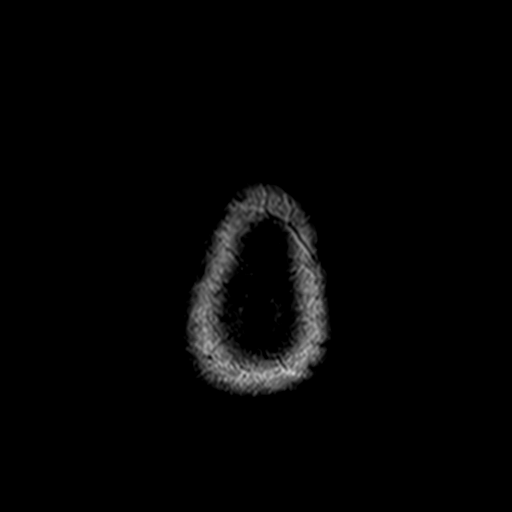

[Series 12: T2 · axial · 5.0mm · 0.65mm/px · 1 of 28 slices shown (1 of 2)]
[im 1/28]
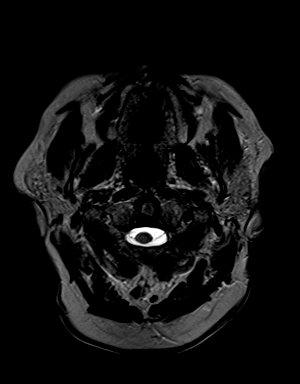

[Series 13: t1_mpr_tra · axial · 1.0mm · 0.72mm/px · z∈[-3,+145]mm · 7 of 160 slices shown]
[im 1/160]
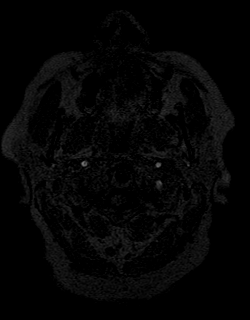
[im 27/160]
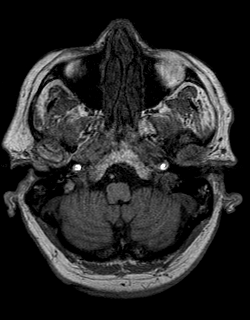
[im 54/160]
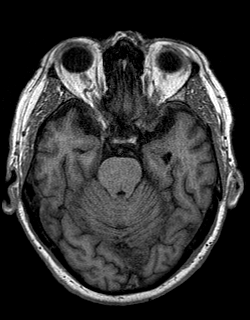
[im 80/160]
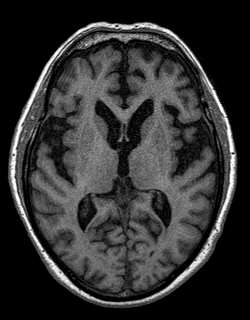
[im 107/160]
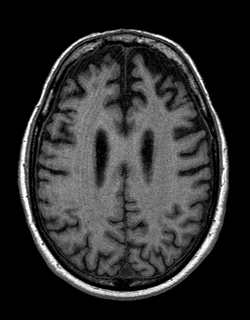
[im 133/160]
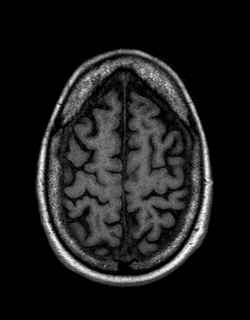
[im 160/160]
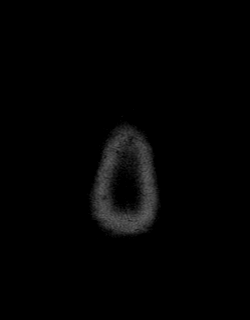

[Series 14: T2 · coronal · 5.0mm · 0.43mm/px · 1 of 28 slices shown (2 of 2)]
[im 1/28]
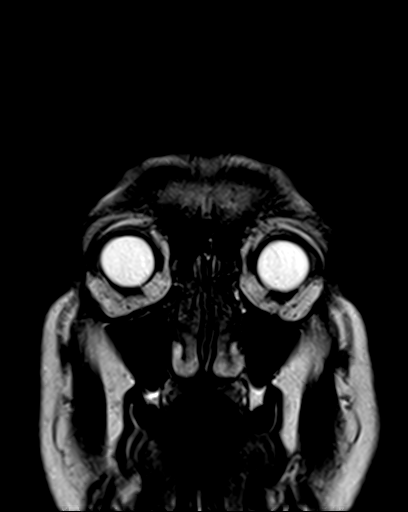

[Series 15: post t1_mpr_tra · axial · 1.0mm · 0.72mm/px · z∈[-3,+145]mm · 7 of 160 slices shown]
[im 1/160]
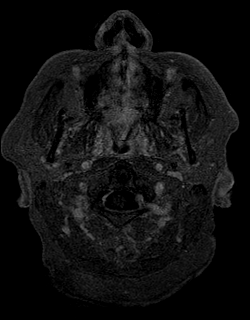
[im 27/160]
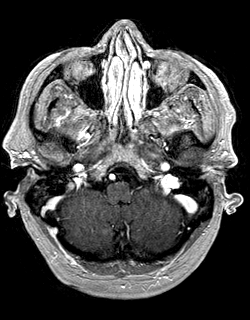
[im 54/160]
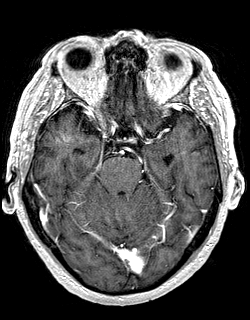
[im 80/160]
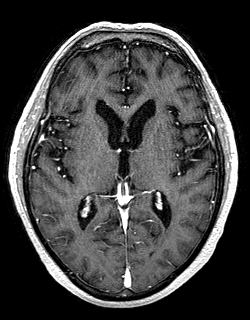
[im 107/160]
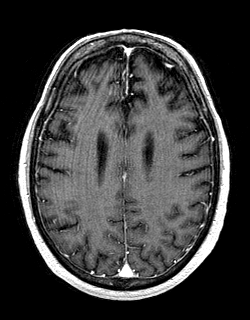
[im 133/160]
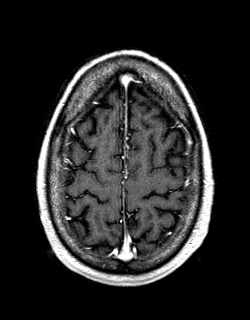
[im 160/160]
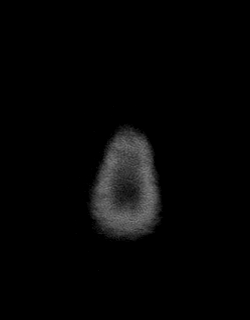

[Series 16: T1 post-contrast · coronal · 5.0mm · 0.72mm/px · 1 of 26 slices shown]
[im 1/26]
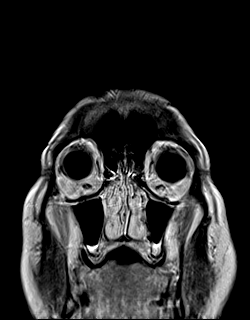

[48 of 48 positions shown; findings below may reference images not displayed]

FINDINGS: The study is partially degraded by motion.

Brain: No acute infarction, hemorrhage, hydrocephalus, extra-axial
collection or mass lesion. Patchy T2 hyperintensity in the
periventricular white matter, nonspecific, most likely related to
chronic small vessel ischemia. Mild parenchymal volume loss. No
focus of abnormal contrast enhancement identified.

Vascular: Normal flow voids.

Skull and upper cervical spine: Normal marrow signal.

Sinuses/Orbits: Negative.

Other: Bilateral mastoid effusion, right greater than left.
IMPRESSION: 1. Mild chronic microvascular ischemic changes of the white matter.
2. Mild parenchymal volume loss.

## 2022-03-16 MED ORDER — REPATHA 140 MG/ML ~~LOC~~ SOSY: 140.0000 mg | PREFILLED_SYRINGE | SUBCUTANEOUS | 0 refills | Status: DC

## 2022-03-16 NOTE — Patient Instructions (Signed)
Start on repatha 140 mg/ml. One shot every 2 weeks.   Refer to neurology.   Recommend tylenol and/or glucosamine chondroitin.

## 2022-03-16 NOTE — Progress Notes (Signed)
Subjective:  Patient ID: Jeffery Turner, male    DOB: 1940/07/31  Age: 81 y.o. MRN: [id number]  Chief Complaint  Patient presents with   Prediabetes    HPI Memory loss: Patient is taking Namenda 10 mg twice a day.  Aricept 10 mg before bed.   Hypothyroidism: He takes Levothyroxine 25 mcg daily.   CORONARY ARTERY DISEASE: NTG. Aspirin. No significant chest pain or breathing issues.   Prediabetic: Last A1c was 6.2.  We will recheck today.  Eats healthy.  Active.  Paroxysmal Atrial fibrillation: not currently on medicines.   Current Outpatient Medications on File Prior to Visit  Medication Sig Dispense Refill   aspirin EC 81 MG tablet Take 1 tablet (81 mg total) by mouth daily. Swallow whole. 90 tablet 3   donepezil (ARICEPT) 10 MG tablet Take 1 tablet (10 mg total) by mouth at bedtime. 90 tablet 0   levothyroxine (SYNTHROID) 25 MCG tablet Take 1 tablet (25 mcg total) by mouth daily before breakfast. 90 tablet 3   memantine (NAMENDA) 10 MG tablet TAKE 1 TABLET TWICE DAILY 180 tablet 1   nitroGLYCERIN (NITROSTAT) 0.4 MG SL tablet Place 1 tablet (0.4 mg total) under the tongue every 5 (five) minutes as needed. If require 2, recommend call 911. 50 tablet 0   No current facility-administered medications on file prior to visit.   Past Medical History:  Diagnosis Date   Anemia    Angina pectoris ([place]) 02/06/2018   Angina, class III ([place]) 02/06/2018   Anginal equivalent 09/19/2019   CAD (coronary artery disease)    s/p stent to CFX 1997 2/2 MI s/p DES to [facility] and DES mLAD 07/14/10 2/2 [place] cath 07/14/10: dLAD 60%; CFX stent ok; OM1 30-40%; pRCA 30%; PDA 40%; EF 60%   Constant exophthalmos    right   Coronary artery disease of native artery of native heart with stable angina pectoris ([place]) 07/21/2010   Qualifier: Diagnosis of  By: Denny Peon, CMA, [name]     Epistaxis 07/21/2010   Qualifier: Diagnosis of  By: Jorene Minors, Scott     Hearing loss    right eye - no hearing aids    Hyperlipidemia    MI (myocardial infarction) ([place])    Mixed hyperlipidemia 07/21/2010   Qualifier: Diagnosis of  By: Orville Govern, CMA, Carol     Old myocardial infarction 07/21/2010   Qualifier: Diagnosis of  By: Orville Govern CMA, Carol     Osteoarthritis    OSTEOARTHRITIS 07/21/2010   Qualifier: Diagnosis of  By: Orville Govern CMA, Carol     PMR (polymyalgia rheumatica) ([place]) 04/03/2021   S/P CABG x 3 02/05/2020   Shortness of breath 07/21/2010   Qualifier: Diagnosis of  By: Jorene Minors, Scott     Thyroid disease    TOBACCO USE, QUIT 07/21/2010   Qualifier: Diagnosis of  By: Orville Govern, CMA, Carol     Transient visual loss, right    wears glasses   UNSTABLE ANGINA 07/21/2010   Qualifier: Diagnosis of  By: Orville Govern, CMA, Carol     Upper respiratory tract infection due to COVID-19 virus 05/14/2021   Past Surgical History:  Procedure Laterality Date   CARDIAC CATHETERIZATION     CORONARY ARTERY BYPASS GRAFT N/A 02/05/2020   Procedure: CORONARY ARTERY BYPASS GRAFTING (CABG), ON PUMP, TIMES THREE, USING LEFT INTERNAL MAMMARY ARTERY TO LEFT ANTERIOR DESCENDING ARTERY, REVERSE SAPHENOUS VEIN GRAFT TO POSTERIOR DESCENDING ARTERY AND CIRCUMFLEX ARTERY;  Surgeon: Grace Isaac, MD;  Location: [facility];  Service:  Open Heart Surgery;  Laterality: N/A;   ENDOVEIN HARVEST OF GREATER SAPHENOUS VEIN Right 02/05/2020   Procedure: ENDOVEIN HARVEST OF GREATER SAPHENOUS VEIN;  Surgeon: Grace Isaac, MD;  Location: [facility];  Service: Open Heart Surgery;  Laterality: Right;   LEFT HEART CATH AND CORONARY ANGIOGRAPHY N/A 01/25/2020   Procedure: LEFT HEART CATH AND CORONARY ANGIOGRAPHY;  Surgeon: Leonie Man, MD;  Location: [facility];  Service: Cardiovascular;  Laterality: N/A;   TEE WITHOUT CARDIOVERSION N/A 02/05/2020   Procedure: TRANSESOPHAGEAL ECHOCARDIOGRAM (TEE);  Surgeon: Grace Isaac, MD;  Location: [facility];  Service: Open Heart Surgery;  Laterality: N/A;   TONSILLECTOMY AND ADENOIDECTOMY     WISDOM  TOOTH EXTRACTION      Family History  Problem Relation Age of Onset   Heart failure Mother    Alzheimer's disease Mother    Alzheimer's disease Sister    Coronary artery disease Other    Social History   Socioeconomic History   Marital status: Married    Spouse name: Not on file   Number of children: Not on file   Years of education: Not on file   Highest education level: Not on file  Occupational History   Not on file  Tobacco Use   Smoking status: Former    Packs/day: 1.50    Years: 37.00    Total pack years: 55.50    Types: Cigarettes    Quit date: 05/2000    Years since quitting: 21.8   Smokeless tobacco: Never  Vaping Use   Vaping Use: Never used  Substance and Sexual Activity   Alcohol use: No   Drug use: No   Sexual activity: Not Currently  Other Topics Concern   Not on file  Social History Narrative   He lives in [place] with family.  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         Social Determinants of Health   Financial Resource Strain: Not on file  Food Insecurity: Not on file  Transportation Needs: Not on file  Physical Activity: Sufficiently Active (05/18/2020)   Exercise Vital Sign    Days of Exercise per Week: 5 days    Minutes of Exercise per Session: 30 min  Stress: Stress Concern Present (05/18/2020)   [place]    Feeling of Stress : To some extent  Social Connections: Unknown (05/18/2020)   Social Connection and Isolation Panel [NHANES]    Frequency of Communication with Friends and Family: Not on file    Frequency of Social Gatherings with Friends and Family: Not on file    Attends Religious Services: Not on file    Active Member of Clubs or Organizations: Not on file    Attends Archivist Meetings: Not on file    Marital Status: Married    Review of Systems   Constitutional:  Negative for chills and fever.  HENT:  Negative for congestion, rhinorrhea and sore throat.   Respiratory:  Positive for cough, shortness of breath and wheezing.   Cardiovascular:  Negative for chest pain and palpitations.  Gastrointestinal:  Negative for abdominal pain, constipation, diarrhea, nausea and vomiting.  Genitourinary:  Negative for dysuria and urgency.  Musculoskeletal:  Positive for arthralgias (bilateral knee pain, hand pain). Negative for back pain and myalgias.  Neurological:  Negative for dizziness, numbness and headaches.  Psychiatric/Behavioral:  Negative for dysphoric mood. The patient is not nervous/anxious.      Objective:  BP 120/64   Pulse 84   Temp (!) 97 F (36.1 C)   Resp 18   Ht '5\' 7"'$  (1.702 m)   Wt 215 lb (97.5 kg)   BMI 33.67 kg/m      03/16/2022    3:26 PM 01/18/2022    1:35 PM 12/10/2021    8:03 AM  BP/Weight  Systolic BP 165 90 537  Diastolic BP 64 62 64  Wt. (Lbs) 215 217 218  BMI 33.67 kg/m2 33.99 kg/m2 34.14 kg/m2    Physical Exam Vitals reviewed.  Constitutional:      Appearance: Normal appearance.  Neck:     Vascular: No carotid bruit.  Cardiovascular:     Rate and Rhythm: Normal rate and regular rhythm.     Pulses: Normal pulses.     Heart sounds: Normal heart sounds.  Pulmonary:     Effort: Pulmonary effort is normal.     Breath sounds: Normal breath sounds. No wheezing, rhonchi or rales.  Abdominal:     General: Bowel sounds are normal.     Palpations: Abdomen is soft.     Tenderness: There is no abdominal tenderness.  Neurological:     Mental Status: He is alert.  Psychiatric:        Mood and Affect: Mood normal.        Behavior: Behavior normal.     Diabetic Foot Exam - Simple   No data filed      Lab Results  Component Value Date   WBC 4.6 03/15/2022   HGB 12.8 (L) 03/15/2022   HCT 38.2 03/15/2022   PLT 320 03/15/2022   GLUCOSE 103 (H) 03/15/2022   CHOL 197 03/15/2022   TRIG 174 (H)  03/15/2022   HDL 27 (L) 03/15/2022   LDLDIRECT 152.5 10/05/2010   LDLCALC 138 (H) 03/15/2022   ALT 12 03/15/2022   AST 12 03/15/2022   NA 136 03/15/2022   K 4.4 03/15/2022   CL 99 03/15/2022   CREATININE 0.94 03/15/2022   BUN 17 03/15/2022   CO2 23 03/15/2022   TSH 1.230 03/15/2022   INR 1.4 (H) 02/05/2020   HGBA1C 6.0 (H) 03/15/2022      Assessment & Plan:   Problem List Items Addressed This Visit       Cardiovascular and Mediastinum   CAD (coronary artery disease)    Continue aspirin 81 mg daily. Has ntg SL.  Needs cholesterol medicines. Intolerant to statins.      Relevant Medications   Evolocumab (REPATHA) 140 MG/ML SOSY     Nervous and Auditory   Mild late onset Alzheimer's dementia without behavioral disturbance, psychotic disturbance, mood disturbance, or anxiety (HCC)    Continue namenda 10 mg twice daily, aricept 10 mg daily.  Refer to neurology.       Relevant Orders   Ambulatory referral to Neurology     Other   Mixed hyperlipidemia    Start  repatha 140 mg IM once every 2 weeks. First one given in our office. Continue to work on eating a healthy diet and exercise.  Labs drawn today.        Relevant Medications   Evolocumab (REPATHA) 140 MG/ML SOSY   Muscle cramp    Intolerant to statins.       Other Visit Diagnoses     Moderate late onset Alzheimer's  dementia, unspecified whether behavioral, psychotic, or mood disturbance or anxiety ([place])    -  Primary   Myalgia due to statin         .  Meds ordered this encounter  Medications   Evolocumab (REPATHA) 140 MG/ML SOSY    Sig: Inject 140 mg into the skin every 14 (fourteen) days.    Dispense:  6 mL    Refill:  0    Orders Placed This Encounter  Procedures   Ambulatory referral to Neurology     Follow-up: Return in about 3 months (around 06/16/2022) for chronic fasting, lab visit.  An After Visit Summary was printed and given to the patient.  Rochel Brome, MD [facility] [phone]

## 2022-03-21 ENCOUNTER — Encounter: Payer: Self-pay | Admitting: Family Medicine

## 2022-03-21 NOTE — Assessment & Plan Note (Signed)
Continue aspirin 81 mg daily. Has ntg SL.  Needs cholesterol medicines. Intolerant to statins.

## 2022-03-21 NOTE — Assessment & Plan Note (Signed)
Start  repatha 140 mg IM once every 2 weeks. First one given in our office. Continue to work on eating a healthy diet and exercise.  Labs drawn today.

## 2022-03-21 NOTE — Assessment & Plan Note (Signed)
Intolerant to statins. 

## 2022-03-21 NOTE — Assessment & Plan Note (Signed)
Continue namenda 10 mg twice daily, aricept 10 mg daily.  Refer to neurology.

## 2022-03-21 NOTE — Assessment & Plan Note (Signed)
>>  ASSESSMENT AND PLAN FOR CAD (CORONARY ARTERY DISEASE) WRITTEN ON 03/21/2022  6:18 PM BY COX, KIRSTEN, MD  Continue aspirin 81 mg daily. Has ntg SL.  Needs cholesterol medicines. Intolerant to statins.

## 2022-03-23 ENCOUNTER — Encounter: Payer: Self-pay | Admitting: Physician Assistant

## 2022-03-31 ENCOUNTER — Encounter: Payer: Self-pay | Admitting: Physician Assistant

## 2022-03-31 ENCOUNTER — Ambulatory Visit: Payer: Medicare HMO | Admitting: Physician Assistant

## 2022-03-31 VITALS — BP 110/60 | Resp 18 | Wt 214.0 lb

## 2022-03-31 DIAGNOSIS — F02A0 Dementia in other diseases classified elsewhere, mild, without behavioral disturbance, psychotic disturbance, mood disturbance, and anxiety: Secondary | ICD-10-CM

## 2022-03-31 DIAGNOSIS — R413 Other amnesia: Secondary | ICD-10-CM

## 2022-03-31 DIAGNOSIS — G301 Alzheimer's disease with late onset: Secondary | ICD-10-CM | POA: Diagnosis not present

## 2022-03-31 NOTE — Patient Instructions (Addendum)
It was a pleasure to see you today at our office.  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 Side effects were discussed  Continue Memantine 10 mg twice daily.Side effects were discussed  Neuropsych testing for clarity of diagnosis and disease trajectory   Consider resume Physical therapy    Whom to call:  Memory  decline, memory medications: Call our office [phone]   For psychiatric meds, mood meds: Please have your primary care physician manage these medications.   Counseling regarding caregiver distress, including caregiver depression, anxiety and issues regarding community resources, adult day care programs, adult living facilities, or memory care questions:   Feel free to contact [place], Social Worker at [phone]   For assessment of decision of mental capacity and competency:  Call Dr. Anthoney Harada, geriatric psychiatrist at [phone]  For guidance in geriatric dementia issues please call [facility] [phone]  For guidance regarding [facility] and if placement were needed at the facility, contact Arnell Asal, Social Worker tel: [phone]  If you have any severe symptoms of a stroke, or other severe issues such as confusion,severe chills or fever, etc call 911 or go to the ER as you may need to be evaluated further      RECOMMENDATIONS FOR ALL PATIENTS WITH MEMORY PROBLEMS: 1. Continue to exercise (Recommend 30 minutes of walking everyday, or 3 hours every week) 2. Increase social interactions - continue going to [place] and enjoy social gatherings with friends and family 3. Eat healthy, avoid fried foods and eat more fruits and vegetables 4. Maintain adequate blood pressure, blood sugar, and blood cholesterol level. Reducing the risk of  stroke and cardiovascular disease also helps promoting better memory. 5. Avoid stressful situations. Live a simple life and avoid aggravations. Organize your time and prepare for the next day in anticipation. 6. Sleep well, avoid any interruptions of sleep and avoid any distractions in the bedroom that may interfere with adequate sleep quality 7. Avoid sugar, avoid sweets as there is a strong link between excessive sugar intake, diabetes, and cognitive impairment We discussed the Mediterranean diet, which has been shown to help patients reduce the risk of progressive memory disorders and reduces cardiovascular risk. This includes eating fish, eat fruits and green leafy vegetables, nuts like almonds and hazelnuts, walnuts, and also use olive oil. Avoid fast foods and fried foods as much as possible. Avoid sweets and sugar as sugar use has been linked to worsening of memory function.  There is always a concern of gradual progression of memory problems. If this is the case, then we may need to adjust level of care according to patient needs. Support, both to the patient and caregiver, should then be put into place.    The Alzheimer's Association is here all day, every day for people facing Alzheimer's disease through our free 24/7 Helpline: [phone]. The Helpline provides reliable information and support to all those who need assistance, such as individuals living with memory loss, Alzheimer's or other dementia, caregivers, health care professionals and the public.  Our highly trained and knowledgeable staff can help you with: Understanding memory loss, dementia and Alzheimer's  Medications and other treatment options  General information about aging and brain health  Skills to provide quality care and to find the best care from professionals  Legal, financial and  living-arrangement decisions Our Helpline also features: Confidential care consultation provided by master's level clinicians who can  help with decision-making support, crisis assistance and education on issues families face every day  Help in a caller's preferred language using our translation service that features more than 200 languages and dialects  Referrals to local community programs, services and ongoing support     FALL PRECAUTIONS: Be cautious when walking. Scan the area for obstacles that may increase the risk of trips and falls. When getting up in the mornings, sit up at the edge of the bed for a few minutes before getting out of bed. Consider elevating the bed at the head end to avoid drop of blood pressure when getting up. Walk always in a well-lit room (use night lights in the walls). Avoid area rugs or power cords from appliances in the middle of the walkways. Use a walker or a cane if necessary and consider physical therapy for balance exercise. Get your eyesight checked regularly.  FINANCIAL OVERSIGHT: Supervision, especially oversight when making financial decisions or transactions is also recommended.  HOME SAFETY: Consider the safety of the kitchen when operating appliances like stoves, microwave oven, and blender. Consider having supervision and share cooking responsibilities until no longer able to participate in those. Accidents with firearms and other hazards in the house should be identified and addressed as well.   ABILITY TO BE LEFT ALONE: If patient is unable to contact 911 operator, consider using LifeLine, or when the need is there, arrange for someone to stay with patients. Smoking is a fire hazard, consider supervision or cessation. Risk of wandering should be assessed by caregiver and if detected at any point, supervision and safe proof recommendations should be instituted.  MEDICATION SUPERVISION: Inability to self-administer medication needs to be constantly addressed. Implement a mechanism to ensure safe administration of the medications.   DRIVING: Regarding driving, in patients with  progressive memory problems, driving will be impaired. We advise to have someone else do the driving if trouble finding directions or if minor accidents are reported. Independent driving assessment is available to determine safety of driving.   If you are interested in the driving assessment, you can contact the following:  [employer] in [place]  Benson Santiago [phone] or [phone]      [place] refers to food and lifestyle choices that are based on the traditions of countries located on the [employer]. This way of eating has been shown to help prevent certain conditions and improve outcomes for people who have chronic diseases, like kidney disease and heart disease. What are tips for following this plan? Lifestyle  Cook and eat meals together with your family, when possible. Drink enough fluid to keep your urine clear or pale yellow. Be physically active every day. This includes: Aerobic exercise like running or swimming. Leisure activities like gardening, walking, or housework. Get 7-8 hours of sleep each night. If recommended by your health care provider, drink red wine in moderation. This means 1 glass a day for nonpregnant women and 2 glasses a day for men. A glass of wine equals 5 oz (150 mL). Reading food labels  Check the serving size of packaged foods. For foods such as rice and pasta, the serving size refers to the amount of cooked product, not dry. Check the total fat in packaged foods. Avoid foods that have saturated fat or trans fats. Check the  ingredients list for added sugars, such as corn syrup. Shopping  At the grocery store, buy most of your food from the areas near the walls of the store. This includes: Fresh fruits and vegetables (produce). Grains, beans, nuts, and seeds. Some of these may be available in  unpackaged forms or large amounts (in bulk). Fresh seafood. Poultry and eggs. Low-fat dairy products. Buy whole ingredients instead of prepackaged foods. Buy fresh fruits and vegetables in-season from local farmers markets. Buy frozen fruits and vegetables in resealable bags. If you do not have access to quality fresh seafood, buy precooked frozen shrimp or canned fish, such as tuna, salmon, or sardines. Buy small amounts of raw or cooked vegetables, salads, or olives from the deli or salad bar at your store. Stock your pantry so you always have certain foods on hand, such as olive oil, canned tuna, canned tomatoes, rice, pasta, and beans. Cooking  Cook foods with extra-virgin olive oil instead of using butter or other vegetable oils. Have meat as a side dish, and have vegetables or grains as your main dish. This means having meat in small portions or adding small amounts of meat to foods like pasta or stew. Use beans or vegetables instead of meat in common dishes like chili or lasagna. Experiment with different cooking methods. Try roasting or broiling vegetables instead of steaming or sauteing them. Add frozen vegetables to soups, stews, pasta, or rice. Add nuts or seeds for added healthy fat at each meal. You can add these to yogurt, salads, or vegetable dishes. Marinate fish or vegetables using olive oil, lemon juice, garlic, and fresh herbs. Meal planning  Plan to eat 1 vegetarian meal one day each week. Try to work up to 2 vegetarian meals, if possible. Eat seafood 2 or more times a week. Have healthy snacks readily available, such as: Vegetable sticks with hummus. Greek yogurt. Fruit and nut trail mix. Eat balanced meals throughout the week. This includes: Fruit: 2-3 servings a day Vegetables: 4-5 servings a day Low-fat dairy: 2 servings a day Fish, poultry, or lean meat: 1 serving a day Beans and legumes: 2 or more servings a week Nuts and seeds: 1-2 servings a day Whole  grains: 6-8 servings a day Extra-virgin olive oil: 3-4 servings a day Limit red meat and sweets to only a few servings a month What are my food choices? Mediterranean diet Recommended Grains: Whole-grain pasta. Brown rice. Bulgar wheat. Polenta. Couscous. Whole-wheat bread. [place]. Vegetables: Artichokes. Beets. Broccoli. Cabbage. Carrots. Eggplant. Green beans. Chard. Kale. Spinach. Onions. Leeks. Peas. Squash. Tomatoes. Peppers. Radishes. Fruits: Apples. Apricots. Avocado. Berries. Bananas. Cherries. Dates. Figs. Grapes. Lemons. Melon. Oranges. Peaches. Plums. Pomegranate. Meats and other protein foods: Beans. Almonds. Sunflower seeds. Pine nuts. Peanuts. [place]. Salmon. Scallops. Shrimp. [place]. Tilapia. Clams. Oysters. Eggs. Dairy: Low-fat milk. Cheese. Greek yogurt. Beverages: Water. Red wine. Herbal tea. Fats and oils: Extra virgin olive oil. Avocado oil. Grape seed oil. Sweets and desserts: [place] yogurt with honey. Baked apples. Poached pears. Trail mix. Seasoning and other foods: Basil. Cilantro. Coriander. Cumin. Mint. Parsley. Sage. Rosemary. Tarragon. Garlic. Oregano. Thyme. Pepper. Balsalmic vinegar. Tahini. Hummus. Tomato sauce. Olives. Mushrooms. Limit these Grains: Prepackaged pasta or rice dishes. Prepackaged cereal with added sugar. Vegetables: Deep fried potatoes (french fries). Fruits: Fruit canned in syrup. Meats and other protein foods: Beef. Pork. Lamb. Poultry with skin. Hot dogs. Berniece Salines. Dairy: Ice cream. Sour cream. Whole milk. Beverages: Juice. Sugar-sweetened soft drinks. Beer. Liquor and spirits. Fats and oils: Butter. Canola  oil. Vegetable oil. Beef fat (tallow). Lard. Sweets and desserts: Cookies. Cakes. Pies. Candy. Seasoning and other foods: Mayonnaise. Premade sauces and marinades. The items listed may not be a complete list. Talk with your dietitian about what dietary choices are right for you. Summary The Mediterranean diet includes both food and  lifestyle choices. Eat a variety of fresh fruits and vegetables, beans, nuts, seeds, and whole grains. Limit the amount of red meat and sweets that you eat. Talk with your health care provider about whether it is safe for you to drink red wine in moderation. This means 1 glass a day for nonpregnant women and 2 glasses a day for men. A glass of wine equals 5 oz (150 mL). This information is not intended to replace advice given to you by your health care provider. Make sure you discuss any questions you have with your health care provider. Document Released: 01/22/2016 Document Revised: 02/24/2016 Document Reviewed: 01/22/2016 Elsevier Interactive Patient Education  2017 [employer].

## 2022-03-31 NOTE — Progress Notes (Signed)
Assessment/Plan:    The patient is seen in neurologic consultation at the request of Cox, Elnita Maxwell, MD for the evaluation of memory.  Jeffery Turner is a very pleasant 81 y.o. year old RH male with  a history of  sensorineural hearing loss, CAD sp CABG, , PMR,  prediabetes, hyperlipidemia, OA, PAF, hypothyroidism,  seen today for evaluation of memory loss. MRI brain 08/2021 personally reviewed is remarkable for mild chronic microvascular ischemic changes and mild parenchymal volume loss.  Patient is already on memantine 10 mg bid  and then added  donepezil 10 mg qhs recently as per PCP. MoCA today is  25/30 .  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 and for clarity of diagnosis  Check B12  Continue donepezil 10 mg daily. Side effects were discussed  Continue Memantine 10 mg twice daily. Side effects were discussed  Recommend good control of cardiovascular risk factors.   Continue to control mood as per PCP  Folllow up 6 months   Subjective:    The patient is accompanied by his wife who supplements the history.    How long did patient have memory difficulties? About 2 years ago after CABG he noticed more forgetfulness with conversations and short term memory in general . Long term memory is good. He may not pay much attention to what it is said to him.  His wife reports he also has difficulty using tools and appliances such as the remote control or the computer . He and enjoys doing crossword puzzles, word finding.  repeats oneself? Endorsed , especially with appointment Disoriented when walking into a room?  Patient denies   Leaving objects in unusual places?  Patient denies   Patient lives with his wife  Ambulates  with difficulty?   Patient denies, but he gets short of breath so he uses  wheelchair or cane  Recent falls?  Patient denies   Any head injuries?  Patient denies   History of seizures?   Patient denies   Wandering behavior?  Patient denies   Patient drives?  Over the lat 6 months he gets lost at times, or does not remember where to go Any mood changes ? "Occasionally when he may not understand something or cannot hear well " Any depression?:  Patient denies   Hallucinations?  Patient denies   Paranoia?  Patient denies   Patient reports that sleeps well without vivid dreams, REM behavior or sleepwalking    History of sleep apnea?  Patient denies   Any hygiene concerns?  Patient denies   Independent of bathing and dressing?  Endorsed  Does the patient needs help with medications? Wife  in charge  Who is in charge of the finances?   Wife is in charge   Any changes in appetite?  Patient denies , but not as much as before  Patient have trouble swallowing? Patient denies   Does the patient cook?  Patient denies   Any headaches?  Patient denies   Double vision? Patient denies   Any focal numbness or tingling?  Patient denies   Chronic back pain Patient denies   Unilateral weakness?  Patient denies   Any tremors?  Patient denies   Any history of anosmia?" Not as well as before, for the last year " Any incontinence of urine?  Patient denies  Any bowel dysfunction?   Patient denies   History of heavy alcohol intake?  Patient denies   History of heavy tobacco use?  Patient denies  quit x 22 years  Family history of dementia?  Strong family history in  Mother and aunts uncles and brothers (Alzheimer's disease)  Pertinent labs 03/2022 Hb 12.8, TG 174, LDL 138, A1C 6.0, TSH 1.2  Allergies  Allergen Reactions   Bactrim [Sulfamethoxazole-Trimethoprim] Shortness Of Breath    Breathing problems.  Rt ear - hearing loss.    Codeine Nausea And Vomiting   Statins Other (See Comments)    Patient preference to not take statins/cholesterol meds    Current Outpatient  Medications  Medication Instructions   aspirin EC 81 mg, Oral, Daily, Swallow whole.   donepezil (ARICEPT) 10 mg, Oral, Daily at bedtime   levothyroxine (SYNTHROID) 25 mcg, Oral, Daily before breakfast   memantine (NAMENDA) 10 mg, Oral, 2 times daily   nitroGLYCERIN (NITROSTAT) 0.4 mg, Sublingual, Every 5 min PRN, If require 2, recommend call 911.   Repatha 140 mg, Subcutaneous, Every 14 days     VITALS:   Vitals:   03/31/22 1315  BP: 110/60  Resp: 18  Weight: 214 lb (97.1 kg)      03/16/2022    3:31 PM 01/13/2022    9:04 AM 08/11/2021    1:36 PM 07/23/2020    7:42 AM 04/23/2020    1:41 PM  Depression screen PHQ 2/9  Decreased Interest 0 0 0 0 0  Down, Depressed, Hopeless 0 0 0 0 0  PHQ - 2 Score 0 0 0 0 0    PHYSICAL EXAM   HEENT:  Normocephalic, atraumatic. The mucous membranes are moist. The superficial temporal arteries are without ropiness or tenderness. Cardiovascular: Regular rate and rhythm. Lungs: Clear to auscultation bilaterally. Neck: There are no carotid bruits noted bilaterally.  NEUROLOGICAL:    03/31/2022    1:00 PM  Montreal Cognitive Assessment   Visuospatial/ Executive (0/5) 5  Naming (0/3) 3  Attention: Read list of digits (0/2) 2  Attention: Read list of letters (0/1) 1  Attention: Serial 7 subtraction starting at 100 (0/3) 3  Language: Repeat phrase (0/2) 2  Language : Fluency (0/1) 1  Abstraction (0/2) 2  Delayed Recall (0/5) 0  Orientation (0/6) 6  Total 25  Adjusted Score (based on education) 25       06/30/2021    2:09 PM 04/21/2020    4:31 PM  MMSE - Mini Mental State Exam  Orientation to time 5 5  Orientation to Place 5 5  Registration 3 3  Attention/ Calculation 5 5  Recall 2 1  Recall-comments  problems with recall  Language- name 2 objects 2 2  Language- repeat 1 1  Language- follow 3 step command 3 3  Language- read & follow direction 1 1  Write a sentence 1 1  Copy design 1 1  Total score 29 28     Orientation:   Alert and oriented to person, place and time. No aphasia or dysarthria. Fund of knowledge is appropriate. Recent memory impaired and remote memory intact.  Attention and concentration are normal.  Able to name objects and repeat phrases. Delayed recall  0/5 Cranial nerves: There is good facial symmetry. Extraocular muscles are intact and visual fields are full to confrontational testing on the L, constant outer deviation on the R. Speech is fluent and clear. Soft palate rises symmetrically and there is no tongue deviation. Hearing  is intact to conversational tone. Tone: Tone is good throughout. Sensation: Sensation is intact to light touch and pinprick throughout. Vibration is intact at the bilateral big toe.There is no extinction with double simultaneous stimulation. There is no sensory dermatomal level identified. Coordination: The patient has no difficulty with RAM's or FNF bilaterally. Normal finger to nose  Motor: Strength is 5/5 in the bilateral upper and lower extremities. There is no pronator drift. There are no fasciculations noted. DTR's: Deep tendon reflexes are 1/4 at the bilateral biceps, triceps, brachioradialis, patella and achilles.  Plantar responses are downgoing bilaterally. Gait and Station: The patient is able to ambulate with difficulty, needs a cane.The patient is unable to heel toe or in a tandem fashion or to stand in the Romberg position.     Thank you for allowing [place] the opportunity to participate in the care of this nice patient. Please do not hesitate to contact us for any questions or concerns.   Total time spent on today's visit was 60 minutes dedicated to this patient today, preparing to see patient, examining the patient, ordering tests and/or medications and counseling the patient, documenting clinical information in the EHR or other health record, independently interpreting results and communicating results to the patient/family, discussing treatment and goals,  answering patient's questions and coordinating care.  Cc:  Rochel Brome, MD  Sharene Butters 03/31/2022 7:09 PM

## 2022-04-06 ENCOUNTER — Other Ambulatory Visit: Payer: Self-pay | Admitting: Family Medicine

## 2022-04-09 ENCOUNTER — Telehealth: Payer: Self-pay | Admitting: Family Medicine

## 2022-04-09 NOTE — Telephone Encounter (Signed)
PT STATED [facility] HAS [facility]KED IF HIS PCP COULD WRITE LETTER WITH CONCERNS OF HIS HEART IF IT WAS RELATED TO HIS TIME IN THE SERVICE THAT HE COULD GET [facility] DIS[facility] -- TOLD PT IF THE DOCTOR HAD MORE QUESTIONS WE WILL CALL

## 2022-04-13 ENCOUNTER — Other Ambulatory Visit: Payer: Self-pay

## 2022-04-13 NOTE — Telephone Encounter (Signed)
Patient notified.  He wanted to schedule an appointment to come in and discuss the side effects of his medication.  Appointment scheduled for Nov. 7.

## 2022-04-16 ENCOUNTER — Other Ambulatory Visit: Payer: Self-pay

## 2022-04-19 NOTE — Progress Notes (Unsigned)
Subjective:  Patient ID: Jeffery Turner, male    DOB: 07/28/1940  Age: 81 y.o. MRN: [id number]  No chief complaint on file.   HPI  CORONARY ARTERY DISEASE/Hyperlipidemia: presents with questions about high cholesterol.  Current Outpatient Medications on File Prior to Visit  Medication Sig Dispense Refill   aspirin EC 81 MG tablet Take 1 tablet (81 mg total) by mouth daily. Swallow whole. 90 tablet 3   donepezil (ARICEPT) 10 MG tablet TAKE 1 TABLET (10 MG TOTAL) BY MOUTH AT BEDTIME. 90 tablet 1   Evolocumab (REPATHA) 140 MG/ML SOSY Inject 140 mg into the skin every 14 (fourteen) days. 6 mL 0   levothyroxine (SYNTHROID) 25 MCG tablet Take 1 tablet (25 mcg total) by mouth daily before breakfast. 90 tablet 3   memantine (NAMENDA) 10 MG tablet TAKE 1 TABLET TWICE DAILY 180 tablet 1   nitroGLYCERIN (NITROSTAT) 0.4 MG SL tablet Place 1 tablet (0.4 mg total) under the tongue every 5 (five) minutes as needed. If require 2, recommend call 911. 50 tablet 0   No current facility-administered medications on file prior to visit.   Past Medical History:  Diagnosis Date   Anemia    Angina pectoris ([place]) 02/06/2018   Angina, class III ([place]) 02/06/2018   Anginal equivalent 09/19/2019   CAD (coronary artery disease)    s/p stent to CFX 1997 2/2 MI s/p DES to [facility] and DES mLAD 07/14/10 2/2 [place] cath 07/14/10: dLAD 60%; CFX stent ok; OM1 30-40%; pRCA 30%; PDA 40%; EF 60%   Constant exophthalmos    right   Coronary artery disease of native artery of native heart with stable angina pectoris ([place]) 07/21/2010   Qualifier: Diagnosis of  By: Denny Peon, CMA, [name]     Epistaxis 07/21/2010   Qualifier: Diagnosis of  By: Jorene Minors, Scott     Hearing loss    right eye - no hearing aids   Hyperlipidemia    MI (myocardial infarction) ([place])    Mixed hyperlipidemia 07/21/2010   Qualifier: Diagnosis of  By: Orville Govern, CMA, Carol     Old myocardial infarction 07/21/2010   Qualifier: Diagnosis of  By: Orville Govern CMA,  Carol     Osteoarthritis    OSTEOARTHRITIS 07/21/2010   Qualifier: Diagnosis of  By: Orville Govern CMA, Carol     PMR (polymyalgia rheumatica) ([place]) 04/03/2021   S/P CABG x 3 02/05/2020   Shortness of breath 07/21/2010   Qualifier: Diagnosis of  By: Jorene Minors, Scott     Thyroid disease    TOBACCO USE, QUIT 07/21/2010   Qualifier: Diagnosis of  By: Orville Govern, CMA, Carol     Transient visual loss, right    wears glasses   UNSTABLE ANGINA 07/21/2010   Qualifier: Diagnosis of  By: Orville Govern, CMA, Carol     Upper respiratory tract infection due to COVID-19 virus 05/14/2021   Past Surgical History:  Procedure Laterality Date   CARDIAC CATHETERIZATION     CORONARY ARTERY BYPASS GRAFT N/A 02/05/2020   Procedure: CORONARY ARTERY BYPASS GRAFTING (CABG), ON PUMP, TIMES THREE, USING LEFT INTERNAL MAMMARY ARTERY TO LEFT ANTERIOR DESCENDING ARTERY, REVERSE SAPHENOUS VEIN GRAFT TO POSTERIOR DESCENDING ARTERY AND CIRCUMFLEX ARTERY;  Surgeon: Grace Isaac, MD;  Location: [place];  Service: Open Heart Surgery;  Laterality: N/A;   ENDOVEIN HARVEST OF GREATER SAPHENOUS VEIN Right 02/05/2020   Procedure: ENDOVEIN HARVEST OF GREATER SAPHENOUS VEIN;  Surgeon: Grace Isaac, MD;  Location: [place];  Service: Open Heart Surgery;  Laterality: Right;   LEFT HEART CATH AND CORONARY ANGIOGRAPHY N/A 01/25/2020   Procedure: LEFT HEART CATH AND CORONARY ANGIOGRAPHY;  Surgeon: Leonie Man, MD;  Location: [place] [facility];  Service: Cardiovascular;  Laterality: N/A;   TEE WITHOUT CARDIOVERSION N/A 02/05/2020   Procedure: TRANSESOPHAGEAL ECHOCARDIOGRAM (TEE);  Surgeon: Grace Isaac, MD;  Location: [place];  Service: Open Heart Surgery;  Laterality: N/A;   TONSILLECTOMY AND ADENOIDECTOMY     WISDOM TOOTH EXTRACTION      Family History  Problem Relation Age of Onset   Heart failure Mother    Alzheimer's disease Mother    Alzheimer's disease Sister    Coronary artery disease Other    Social History   Socioeconomic  History   Marital status: Married    Spouse name: Not on file   Number of children: 2   Years of education: 14   Highest education level: Not on file  Occupational History   Not on file  Tobacco Use   Smoking status: Former    Packs/day: 1.50    Years: 37.00    Total pack years: 55.50    Types: Cigarettes    Quit date: 05/2000    Years since quitting: 21.9   Smokeless tobacco: Never  Vaping Use   Vaping Use: Never used  Substance and Sexual Activity   Alcohol use: No   Drug use: No   Sexual activity: Not Currently  Other Topics Concern   Not on file  Social History Narrative   He lives in [place] with family.  He is a retired  Games developer.  He quit tobacco in 1997 with approximately 30-pack-  year history and denies alcohol or drug abuse.   Right handed   Drinks caffeine   Two story home      Social Determinants of Health   Financial Resource Strain: Not on file  Food Insecurity: Not on file  Transportation Needs: Not on file  Physical Activity: Sufficiently Active (05/18/2020)   Exercise Vital Sign    Days of Exercise per Week: 5 days    Minutes of Exercise per Session: 30 min  Stress: Stress Concern Present (05/18/2020)   [place]    Feeling of Stress : To some extent  Social Connections: Unknown (05/18/2020)   Social Connection and Isolation Panel [NHANES]    Frequency of Communication with Friends and Family: Not on file    Frequency of Social Gatherings with Friends and Family: Not on file    Attends Religious Services: Not on file    Active Member of Clubs or Organizations: Not on file    Attends Archivist Meetings: Not on file    Marital Status: Married    Review of Systems  Constitutional:  Negative for chills, fatigue and fever.  HENT:  Negative for congestion, ear pain and sore throat.   Respiratory:  Negative for cough and shortness of breath.   Cardiovascular:   Negative for chest pain.  Gastrointestinal:  Negative for abdominal pain, constipation, diarrhea, nausea and vomiting.  Endocrine: Negative for polydipsia, polyphagia and polyuria.  Genitourinary:  Negative for dysuria and frequency.  Musculoskeletal:  Negative for arthralgias and myalgias.  Neurological:  Negative for dizziness and headaches.  Psychiatric/Behavioral:  Negative for dysphoric mood.        No dysphoria     Objective:  There were no vitals taken for this visit.     03/31/2022  1:15 PM 03/16/2022    3:26 PM 01/18/2022    1:35 PM  BP/Weight  Systolic BP 056 979 90  Diastolic BP 60 64 62  Wt. (Lbs) 214 215 217  BMI 33.52 kg/m2 33.67 kg/m2 33.99 kg/m2    Physical Exam Vitals reviewed.  Constitutional:      Appearance: Normal appearance.  Neck:     Vascular: No carotid bruit.  Cardiovascular:     Rate and Rhythm: Normal rate and regular rhythm.     Pulses: Normal pulses.     Heart sounds: Normal heart sounds.  Pulmonary:     Effort: Pulmonary effort is normal.     Breath sounds: Normal breath sounds. No wheezing, rhonchi or rales.  Abdominal:     General: Bowel sounds are normal.     Palpations: Abdomen is soft.     Tenderness: There is no abdominal tenderness.  Neurological:     Mental Status: He is alert.  Psychiatric:        Mood and Affect: Mood normal.        Behavior: Behavior normal.     Diabetic Foot Exam - Simple   No data filed      Lab Results  Component Value Date   WBC 4.6 03/15/2022   HGB 12.8 (L) 03/15/2022   HCT 38.2 03/15/2022   PLT 320 03/15/2022   GLUCOSE 103 (H) 03/15/2022   CHOL 197 03/15/2022   TRIG 174 (H) 03/15/2022   HDL 27 (L) 03/15/2022   LDLDIRECT 152.5 10/05/2010   LDLCALC 138 (H) 03/15/2022   ALT 12 03/15/2022   AST 12 03/15/2022   NA 136 03/15/2022   K 4.4 03/15/2022   CL 99 03/15/2022   CREATININE 0.94 03/15/2022   BUN 17 03/15/2022   CO2 23 03/15/2022   TSH 1.230 03/15/2022   INR 1.4 (H) 02/05/2020    HGBA1C 6.0 (H) 03/15/2022      Assessment & Plan:   Problem List Items Addressed This Visit       Cardiovascular and Mediastinum   Coronary artery disease of native artery of native heart with stable angina pectoris ([place]) - Primary (Chronic)     Other   Mixed hyperlipidemia  .  No orders of the defined types were placed in this encounter.   No orders of the defined types were placed in this encounter.    Follow-up: No follow-ups on file.  An After Visit Summary was printed and given to the patient.  Rochel Brome, MD [facility] [phone]

## 2022-04-20 ENCOUNTER — Ambulatory Visit (INDEPENDENT_AMBULATORY_CARE_PROVIDER_SITE_OTHER): Payer: Medicare HMO | Admitting: Family Medicine

## 2022-04-20 ENCOUNTER — Encounter: Payer: Self-pay | Admitting: Family Medicine

## 2022-04-20 VITALS — BP 122/62 | HR 72 | Temp 97.7°F | Ht 67.0 in | Wt 214.0 lb

## 2022-04-20 DIAGNOSIS — I25118 Atherosclerotic heart disease of native coronary artery with other forms of angina pectoris: Secondary | ICD-10-CM

## 2022-04-20 DIAGNOSIS — R7303 Prediabetes: Secondary | ICD-10-CM | POA: Diagnosis not present

## 2022-04-20 DIAGNOSIS — G5623 Lesion of ulnar nerve, bilateral upper limbs: Secondary | ICD-10-CM

## 2022-04-20 DIAGNOSIS — E782 Mixed hyperlipidemia: Secondary | ICD-10-CM | POA: Diagnosis not present

## 2022-04-20 HISTORY — DX: Lesion of ulnar nerve, bilateral upper limbs: G56.23

## 2022-04-20 NOTE — Assessment & Plan Note (Signed)
Recommend continue to work on eating healthy diet and exercise.  

## 2022-04-20 NOTE — Assessment & Plan Note (Signed)
Work on decreasing compression of ulnar nerves in lazy boy chair to see if improves.

## 2022-04-20 NOTE — Assessment & Plan Note (Addendum)
Recommend continue to work on eating healthy diet and exercise.  Continue repatha

## 2022-04-20 NOTE — Assessment & Plan Note (Signed)
The current medical regimen is effective;  continue present plan and medications. Continue repatha

## 2022-05-10 ENCOUNTER — Ambulatory Visit (INDEPENDENT_AMBULATORY_CARE_PROVIDER_SITE_OTHER): Payer: Medicare HMO | Admitting: Family Medicine

## 2022-05-10 VITALS — BP 100/52 | HR 85 | Temp 98.1°F | Resp 15 | Ht 67.0 in | Wt 213.0 lb

## 2022-05-10 DIAGNOSIS — R29898 Other symptoms and signs involving the musculoskeletal system: Secondary | ICD-10-CM

## 2022-05-10 NOTE — Progress Notes (Signed)
Subjective:  Patient ID: Jeffery Turner, male    DOB: 03-13-1941  Age: 81 y.o. MRN: [id number]  Chief Complaint  Patient presents with   Fatigue    HPI Patient feels weak on both legs than makes him difficulty to walk. He is using a cane. He had this problem on 04/02/2021, and he got better after he takes prednisone 50 mg once a day x 5 days and then prednisone 20 mg daily x 30 days, and then prednisone 15 mg  Last few weeks, he states the weakness is going progressive in the day, and it gets worse in the afternoon that he cannot walk without cane. He denies back pain, leg pain or knee pain. Sometimes he has lower leg pain. Equally weak x 2-3 weeks.  Patient last repatha shot about 2 1/2 weeks ago.     Current Outpatient Medications on File Prior to Visit  Medication Sig Dispense Refill   aspirin EC 81 MG tablet Take 1 tablet (81 mg total) by mouth daily. Swallow whole. 90 tablet 3   donepezil (ARICEPT) 10 MG tablet TAKE 1 TABLET (10 MG TOTAL) BY MOUTH AT BEDTIME. 90 tablet 1   levothyroxine (SYNTHROID) 25 MCG tablet Take 1 tablet (25 mcg total) by mouth daily before breakfast. 90 tablet 3   memantine (NAMENDA) 10 MG tablet TAKE 1 TABLET TWICE DAILY 180 tablet 1   nitroGLYCERIN (NITROSTAT) 0.4 MG SL tablet Place 1 tablet (0.4 mg total) under the tongue every 5 (five) minutes as needed. If require 2, recommend call 911. 50 tablet 0   pantoprazole (PROTONIX) 40 MG tablet Take 40 mg by mouth daily.     Evolocumab (REPATHA) 140 MG/ML SOSY Inject 140 mg into the skin every 14 (fourteen) days. (Patient not taking: Reported on 05/10/2022) 6 mL 0   No current facility-administered medications on file prior to visit.   Past Medical History:  Diagnosis Date   Anemia    Angina pectoris ([place]) 02/06/2018   Angina, class III ([place]) 02/06/2018   Anginal equivalent 09/19/2019   CAD (coronary artery disease)    s/p stent to CFX 1997 2/2 MI s/p DES to [facility] and DES mLAD 07/14/10 2/2 [place] cath 07/14/10:  dLAD 60%; CFX stent ok; OM1 30-40%; pRCA 30%; PDA 40%; EF 60%   Constant exophthalmos    right   Coronary artery disease of native artery of native heart with stable angina pectoris ([place]) 07/21/2010   Qualifier: Diagnosis of  By: Denny Peon, CMA, [name]     Epistaxis 07/21/2010   Qualifier: Diagnosis of  By: Jorene Minors, Scott     Hearing loss    right eye - no hearing aids   Hyperlipidemia    MI (myocardial infarction) ([place])    Mixed hyperlipidemia 07/21/2010   Qualifier: Diagnosis of  By: Orville Govern CMA, Carol     Old myocardial infarction 07/21/2010   Qualifier: Diagnosis of  By: Orville Govern CMA, Carol     Osteoarthritis    OSTEOARTHRITIS 07/21/2010   Qualifier: Diagnosis of  By: Orville Govern CMA, Carol     PMR (polymyalgia rheumatica) ([place]) 04/03/2021   S/P CABG x 3 02/05/2020   Shortness of breath 07/21/2010   Qualifier: Diagnosis of  By: Jorene Minors, Scott     Thyroid disease    TOBACCO USE, QUIT 07/21/2010   Qualifier: Diagnosis of  By: Orville Govern, CMA, Carol     Transient visual loss, right    wears glasses   UNSTABLE ANGINA 07/21/2010   Qualifier:  Diagnosis of  By: Orville Govern, CMA, Carol     Upper respiratory tract infection due to COVID-19 virus 05/14/2021   Past Surgical History:  Procedure Laterality Date   CARDIAC CATHETERIZATION     CORONARY ARTERY BYPASS GRAFT N/A 02/05/2020   Procedure: CORONARY ARTERY BYPASS GRAFTING (CABG), ON PUMP, TIMES THREE, USING LEFT INTERNAL MAMMARY ARTERY TO LEFT ANTERIOR DESCENDING ARTERY, REVERSE SAPHENOUS VEIN GRAFT TO POSTERIOR DESCENDING ARTERY AND CIRCUMFLEX ARTERY;  Surgeon: Grace Isaac, MD;  Location: [place];  Service: Open Heart Surgery;  Laterality: N/A;   ENDOVEIN HARVEST OF GREATER SAPHENOUS VEIN Right 02/05/2020   Procedure: ENDOVEIN HARVEST OF GREATER SAPHENOUS VEIN;  Surgeon: Grace Isaac, MD;  Location: [place];  Service: Open Heart Surgery;  Laterality: Right;   LEFT HEART CATH AND CORONARY ANGIOGRAPHY N/A 01/25/2020   Procedure: LEFT HEART  CATH AND CORONARY ANGIOGRAPHY;  Surgeon: Leonie Man, MD;  Location: [place] [facility];  Service: Cardiovascular;  Laterality: N/A;   TEE WITHOUT CARDIOVERSION N/A 02/05/2020   Procedure: TRANSESOPHAGEAL ECHOCARDIOGRAM (TEE);  Surgeon: Grace Isaac, MD;  Location: [place];  Service: Open Heart Surgery;  Laterality: N/A;   TONSILLECTOMY AND ADENOIDECTOMY     WISDOM TOOTH EXTRACTION      Family History  Problem Relation Age of Onset   Heart failure Mother    Alzheimer's disease Mother    Alzheimer's disease Sister    Coronary artery disease Other    Social History   Socioeconomic History   Marital status: Married    Spouse name: Not on file   Number of children: 2   Years of education: 14   Highest education level: Not on file  Occupational History   Not on file  Tobacco Use   Smoking status: Former    Packs/day: 1.50    Years: 37.00    Total pack years: 55.50    Types: Cigarettes    Quit date: 05/2000    Years since quitting: 22.0   Smokeless tobacco: Never  Vaping Use   Vaping Use: Never used  Substance and Sexual Activity   Alcohol use: No   Drug use: No   Sexual activity: Not Currently  Other Topics Concern   Not on file  Social History Narrative   He lives in [place] with family.  He is a retired  Games developer.  He quit tobacco in 1997 with approximately 30-pack-  year history and denies alcohol or drug abuse.   Right handed   Drinks caffeine   Two story home      Social Determinants of Health   Financial Resource Strain: Not on file  Food Insecurity: Not on file  Transportation Needs: Not on file  Physical Activity: Sufficiently Active (05/18/2020)   Exercise Vital Sign    Days of Exercise per Week: 5 days    Minutes of Exercise per Session: 30 min  Stress: Stress Concern Present (05/18/2020)   [place]    Feeling of Stress : To some extent  Social Connections: Unknown  (05/18/2020)   Social Connection and Isolation Panel [NHANES]    Frequency of Communication with Friends and Family: Not on file    Frequency of Social Gatherings with Friends and Family: Not on file    Attends Religious Services: Not on file    Active Member of Clubs or Organizations: Not on file    Attends Archivist Meetings: Not on file  Marital Status: Married    Review of Systems  Constitutional:  Negative for chills, fatigue, fever and unexpected weight change.  HENT:  Negative for congestion, ear pain, sinus pain and sore throat.   Eyes:  Negative for visual disturbance.  Respiratory:  Negative for cough and shortness of breath.   Cardiovascular:  Negative for chest pain and palpitations.  Gastrointestinal:  Negative for abdominal pain, blood in stool, constipation, diarrhea, nausea and vomiting.  Endocrine: Negative for polydipsia.  Genitourinary:  Negative for dysuria.  Musculoskeletal:  Negative for back pain.  Skin:  Negative for rash.  Neurological:  Positive for weakness. Negative for headaches.     Objective:  BP (!) 100/52   Pulse 85   Temp 98.1 F (36.7 C)   Resp 15   Ht '5\' 7"'$  (1.702 m)   Wt 213 lb (96.6 kg)   SpO2 97%   BMI 33.36 kg/m      05/10/2022    9:50 AM 04/20/2022    1:35 PM 03/31/2022    1:15 PM  BP/Weight  Systolic BP 176 160 737  Diastolic BP 52 62 60  Wt. (Lbs) 213 214 214  BMI 33.36 kg/m2 33.52 kg/m2 33.52 kg/m2    Physical Exam Vitals reviewed.  Constitutional:      Appearance: Normal appearance.  Neck:     Vascular: No carotid bruit.  Cardiovascular:     Rate and Rhythm: Normal rate and regular rhythm.     Pulses: Normal pulses.     Heart sounds: Normal heart sounds.  Pulmonary:     Effort: Pulmonary effort is normal.     Breath sounds: Normal breath sounds. No wheezing, rhonchi or rales.  Abdominal:     General: Bowel sounds are normal.     Palpations: Abdomen is soft.     Tenderness: There is no abdominal  tenderness.  Musculoskeletal:        General: Normal range of motion.  Neurological:     Mental Status: He is alert and oriented to person, place, and time.     Cranial Nerves: No cranial nerve deficit.     Motor: No weakness.     Coordination: Coordination abnormal (rhomberg's mildly positive. negative pronator drift. negative dysmetria).     Gait: Gait abnormal (using cane).  Psychiatric:        Mood and Affect: Mood normal.        Behavior: Behavior normal.    Diabetic Foot Exam - Simple   No data filed      Lab Results  Component Value Date   WBC 5.3 05/10/2022   HGB 12.0 (L) 05/10/2022   HCT 34.8 (L) 05/10/2022   PLT 317 05/10/2022   GLUCOSE 81 05/10/2022   CHOL 197 03/15/2022   TRIG 174 (H) 03/15/2022   HDL 27 (L) 03/15/2022   LDLDIRECT 152.5 10/05/2010   LDLCALC 138 (H) 03/15/2022   ALT 12 05/10/2022   AST 14 05/10/2022   NA 139 05/10/2022   K 5.1 05/10/2022   CL 103 05/10/2022   CREATININE 0.98 05/10/2022   BUN 19 05/10/2022   CO2 20 05/10/2022   TSH 1.230 03/15/2022   INR 1.4 (H) 02/05/2020   HGBA1C 6.0 (H) 03/15/2022      Assessment & Plan:   Problem List Items Addressed This Visit       Other   Bilateral leg weakness - Primary    Check labs.      Relevant Orders   CBC with Differential/Platelet (  Completed)   Comprehensive metabolic panel (Completed)   Sedimentation rate (Completed)   C-reactive protein (Completed)  .  No orders of the defined types were placed in this encounter.   Orders Placed This Encounter  Procedures   CBC with Differential/Platelet   Comprehensive metabolic panel   Sedimentation rate   C-reactive protein     Follow-up: No follow-ups on file.  An After Visit Summary was printed and given to the patient.  Rochel Brome, MD [facility] [phone]

## 2022-05-11 ENCOUNTER — Other Ambulatory Visit: Payer: Self-pay | Admitting: Family Medicine

## 2022-05-11 LAB — CBC WITH DIFFERENTIAL/PLATELET
Basophils Absolute: 0 10*3/uL (ref 0.0–0.2)
Basos: 1 %
EOS (ABSOLUTE): 0.1 10*3/uL (ref 0.0–0.4)
Eos: 2 %
Hematocrit: 34.8 % — ABNORMAL LOW (ref 37.5–51.0)
Hemoglobin: 12 g/dL — ABNORMAL LOW (ref 13.0–17.7)
Immature Grans (Abs): 0 10*3/uL (ref 0.0–0.1)
Immature Granulocytes: 1 %
Lymphocytes Absolute: 1.2 10*3/uL (ref 0.7–3.1)
Lymphs: 23 %
MCH: 29.3 pg (ref 26.6–33.0)
MCHC: 34.5 g/dL (ref 31.5–35.7)
MCV: 85 fL (ref 79–97)
Monocytes Absolute: 0.8 10*3/uL (ref 0.1–0.9)
Monocytes: 16 %
Neutrophils Absolute: 3.1 10*3/uL (ref 1.4–7.0)
Neutrophils: 57 %
Platelets: 317 10*3/uL (ref 150–450)
RBC: 4.09 x10E6/uL — ABNORMAL LOW (ref 4.14–5.80)
RDW: 12.6 % (ref 11.6–15.4)
WBC: 5.3 10*3/uL (ref 3.4–10.8)

## 2022-05-11 LAB — COMPREHENSIVE METABOLIC PANEL
ALT: 12 IU/L (ref 0–44)
AST: 14 IU/L (ref 0–40)
Albumin/Globulin Ratio: 1.7 (ref 1.2–2.2)
Albumin: 4.3 g/dL (ref 3.8–4.8)
Alkaline Phosphatase: 63 IU/L (ref 44–121)
BUN/Creatinine Ratio: 19 (ref 10–24)
BUN: 19 mg/dL (ref 8–27)
Bilirubin Total: 0.3 mg/dL (ref 0.0–1.2)
CO2: 20 mmol/L (ref 20–29)
Calcium: 9.2 mg/dL (ref 8.6–10.2)
Chloride: 103 mmol/L (ref 96–106)
Creatinine, Ser: 0.98 mg/dL (ref 0.76–1.27)
Globulin, Total: 2.5 g/dL (ref 1.5–4.5)
Glucose: 81 mg/dL (ref 70–99)
Potassium: 5.1 mmol/L (ref 3.5–5.2)
Sodium: 139 mmol/L (ref 134–144)
Total Protein: 6.8 g/dL (ref 6.0–8.5)
eGFR: 78 mL/min/{1.73_m2} (ref >59)

## 2022-05-11 LAB — C-REACTIVE PROTEIN: CRP: <1 mg/L (ref 0–10)

## 2022-05-11 LAB — SEDIMENTATION RATE: Sed Rate: 30 mm/hr (ref 0–30)

## 2022-05-11 NOTE — Progress Notes (Signed)
Blood count abnormal.  Hemoglobin slightly dropped.   Liver function normal.  Kidney function normal.  No evidence of polymyalgia rheumatica. I am concerned it is related to his OA knees.  I cannot recall if he has seen orthopedics previously. Would he like a steroid knee injection or a referral to orthopedics?

## 2022-05-13 NOTE — Assessment & Plan Note (Signed)
Check labs 

## 2022-05-14 ENCOUNTER — Telehealth: Payer: Self-pay

## 2022-05-14 NOTE — Telephone Encounter (Signed)
Patient was notified of lab results.  He denies having knee pain or hip pain and does not feel that an injection would help.  Discussed with Dr. Tobie Poet and she advised home exercises for now.  Will refer to physical therapy if symptoms do not improve.

## 2022-05-16 ENCOUNTER — Encounter: Payer: Self-pay | Admitting: Family Medicine

## 2022-05-19 DIAGNOSIS — L821 Other seborrheic keratosis: Secondary | ICD-10-CM | POA: Diagnosis not present

## 2022-05-19 DIAGNOSIS — L814 Other melanin hyperpigmentation: Secondary | ICD-10-CM | POA: Diagnosis not present

## 2022-05-19 DIAGNOSIS — L570 Actinic keratosis: Secondary | ICD-10-CM | POA: Diagnosis not present

## 2022-05-19 DIAGNOSIS — L918 Other hypertrophic disorders of the skin: Secondary | ICD-10-CM | POA: Diagnosis not present

## 2022-05-19 DIAGNOSIS — L578 Other skin changes due to chronic exposure to nonionizing radiation: Secondary | ICD-10-CM | POA: Diagnosis not present

## 2022-05-20 DIAGNOSIS — H353122 Nonexudative age-related macular degeneration, left eye, intermediate dry stage: Secondary | ICD-10-CM | POA: Diagnosis not present

## 2022-05-20 DIAGNOSIS — H353113 Nonexudative age-related macular degeneration, right eye, advanced atrophic without subfoveal involvement: Secondary | ICD-10-CM | POA: Diagnosis not present

## 2022-05-29 ENCOUNTER — Other Ambulatory Visit: Payer: Self-pay | Admitting: Family Medicine

## 2022-05-29 DIAGNOSIS — E782 Mixed hyperlipidemia: Secondary | ICD-10-CM

## 2022-05-29 DIAGNOSIS — I25118 Atherosclerotic heart disease of native coronary artery with other forms of angina pectoris: Secondary | ICD-10-CM

## 2022-06-09 ENCOUNTER — Ambulatory Visit: Payer: Medicare HMO | Admitting: Nurse Practitioner

## 2022-06-16 ENCOUNTER — Other Ambulatory Visit: Payer: Medicare HMO

## 2022-06-16 DIAGNOSIS — R7303 Prediabetes: Secondary | ICD-10-CM

## 2022-06-16 DIAGNOSIS — E782 Mixed hyperlipidemia: Secondary | ICD-10-CM | POA: Diagnosis not present

## 2022-06-17 LAB — LIPID PANEL
Chol/HDL Ratio: 3.3 ratio (ref 0.0–5.0)
Cholesterol, Total: 102 mg/dL (ref 100–199)
HDL: 31 mg/dL — ABNORMAL LOW (ref >39)
LDL Chol Calc (NIH): 46 mg/dL (ref 0–99)
Triglycerides: 143 mg/dL (ref 0–149)
VLDL Cholesterol Cal: 25 mg/dL (ref 5–40)

## 2022-06-17 LAB — CBC WITH DIFFERENTIAL/PLATELET
Basophils Absolute: 0 10*3/uL (ref 0.0–0.2)
Basos: 0 %
EOS (ABSOLUTE): 0.1 10*3/uL (ref 0.0–0.4)
Eos: 2 %
Hematocrit: 38.8 % (ref 37.5–51.0)
Hemoglobin: 13 g/dL (ref 13.0–17.7)
Immature Grans (Abs): 0 10*3/uL (ref 0.0–0.1)
Immature Granulocytes: 0 %
Lymphocytes Absolute: 1.2 10*3/uL (ref 0.7–3.1)
Lymphs: 23 %
MCH: 28 pg (ref 26.6–33.0)
MCHC: 33.5 g/dL (ref 31.5–35.7)
MCV: 83 fL (ref 79–97)
Monocytes Absolute: 0.7 10*3/uL (ref 0.1–0.9)
Monocytes: 13 %
Neutrophils Absolute: 3.2 10*3/uL (ref 1.4–7.0)
Neutrophils: 62 %
Platelets: 277 10*3/uL (ref 150–450)
RBC: 4.65 x10E6/uL (ref 4.14–5.80)
RDW: 12.2 % (ref 11.6–15.4)
WBC: 5.2 10*3/uL (ref 3.4–10.8)

## 2022-06-17 LAB — COMPREHENSIVE METABOLIC PANEL
ALT: 13 IU/L (ref 0–44)
AST: 12 IU/L (ref 0–40)
Albumin/Globulin Ratio: 2.2 (ref 1.2–2.2)
Albumin: 4.4 g/dL (ref 3.7–4.7)
Alkaline Phosphatase: 64 IU/L (ref 44–121)
BUN/Creatinine Ratio: 18 (ref 10–24)
BUN: 19 mg/dL (ref 8–27)
Bilirubin Total: 0.5 mg/dL (ref 0.0–1.2)
CO2: 23 mmol/L (ref 20–29)
Calcium: 9.1 mg/dL (ref 8.6–10.2)
Chloride: 97 mmol/L (ref 96–106)
Creatinine, Ser: 1.03 mg/dL (ref 0.76–1.27)
Globulin, Total: 2 g/dL (ref 1.5–4.5)
Glucose: 92 mg/dL (ref 70–99)
Potassium: 4.4 mmol/L (ref 3.5–5.2)
Sodium: 134 mmol/L (ref 134–144)
Total Protein: 6.4 g/dL (ref 6.0–8.5)
eGFR: 73 mL/min/{1.73_m2} (ref >59)

## 2022-06-17 LAB — HEMOGLOBIN A1C
Est. average glucose Bld gHb Est-mCnc: 134 mg/dL
Hgb A1c MFr Bld: 6.3 % — ABNORMAL HIGH (ref 4.8–5.6)

## 2022-06-17 LAB — CARDIOVASCULAR RISK ASSESSMENT

## 2022-06-17 NOTE — Progress Notes (Signed)
Blood count normal.  Liver function normal.  Kidney function normal.  Cholesterol: LDL great! Continue current treatment. HDL little better, still too low. Recommend healthy diet and exercise.  HBA1C:6.3. Sugars worsening. Prediabetes. Coming close to being diabetic. Recommend start on metformin 500 mg daily.

## 2022-06-18 ENCOUNTER — Telehealth: Payer: Self-pay

## 2022-06-18 NOTE — Telephone Encounter (Signed)
PA for Repatha 140 mg approved from 06/14/22 until 06/14/23.

## 2022-06-22 ENCOUNTER — Encounter: Payer: Self-pay | Admitting: Family Medicine

## 2022-06-22 ENCOUNTER — Ambulatory Visit (INDEPENDENT_AMBULATORY_CARE_PROVIDER_SITE_OTHER): Payer: Medicare HMO | Admitting: Family Medicine

## 2022-06-22 VITALS — BP 116/68 | HR 80 | Temp 96.5°F | Ht 67.0 in | Wt 210.0 lb

## 2022-06-22 DIAGNOSIS — I739 Peripheral vascular disease, unspecified: Secondary | ICD-10-CM | POA: Diagnosis not present

## 2022-06-22 DIAGNOSIS — E782 Mixed hyperlipidemia: Secondary | ICD-10-CM | POA: Diagnosis not present

## 2022-06-22 DIAGNOSIS — F0280 Dementia in other diseases classified elsewhere without behavioral disturbance: Secondary | ICD-10-CM | POA: Diagnosis not present

## 2022-06-22 DIAGNOSIS — E038 Other specified hypothyroidism: Secondary | ICD-10-CM | POA: Diagnosis not present

## 2022-06-22 DIAGNOSIS — T466X5A Adverse effect of antihyperlipidemic and antiarteriosclerotic drugs, initial encounter: Secondary | ICD-10-CM

## 2022-06-22 DIAGNOSIS — R7303 Prediabetes: Secondary | ICD-10-CM | POA: Diagnosis not present

## 2022-06-22 DIAGNOSIS — I25118 Atherosclerotic heart disease of native coronary artery with other forms of angina pectoris: Secondary | ICD-10-CM

## 2022-06-22 DIAGNOSIS — G309 Alzheimer's disease, unspecified: Secondary | ICD-10-CM

## 2022-06-22 DIAGNOSIS — G720 Drug-induced myopathy: Secondary | ICD-10-CM

## 2022-06-22 MED ORDER — DONEPEZIL HCL 10 MG PO TABS: 10.0000 mg | ORAL_TABLET | Freq: Every day | ORAL | 1 refills | Status: DC

## 2022-06-22 MED ORDER — NITROGLYCERIN 0.4 MG SL SUBL: 0.4000 mg | SUBLINGUAL_TABLET | SUBLINGUAL | 0 refills | Status: AC | PRN

## 2022-06-22 NOTE — Progress Notes (Unsigned)
Subjective:  Patient ID: Jeffery Turner, male    DOB: 11-16-1940  Age: 82 y.o. MRN: [id number]  Chief Complaint  Patient presents with   Hyperlipidemia   Prediabetes    HPI Dementia: Patient is taking Namenda 10 mg twice a day.  Aricept 10 mg before bed.   Hypothyroidism: He takes Levothyroxine 25 mcg daily.   CORONARY ARTERY DISEASE: NTG. Aspirin. No significant chest pain or breathing issues.   Prediabetic: Last A1c was 6.3.  We will recheck today.  Eats healthy.  Active.  Paroxysmal Atrial fibrillation: not currently on medicines. Current Outpatient Medications on File Prior to Visit  Medication Sig Dispense Refill   aspirin EC 81 MG tablet Take 1 tablet (81 mg total) by mouth daily. Swallow whole. 90 tablet 3   Evolocumab (REPATHA) 140 MG/ML SOSY INJECT '140MG'$  UNDER THE SKIN EVERY 2 WEEKS 6 mL 1   levothyroxine (SYNTHROID) 25 MCG tablet Take 1 tablet (25 mcg total) by mouth daily before breakfast. 90 tablet 3   memantine (NAMENDA) 10 MG tablet TAKE 1 TABLET TWICE DAILY 180 tablet 1   pantoprazole (PROTONIX) 40 MG tablet Take 40 mg by mouth daily as needed (chest pain).     No current facility-administered medications on file prior to visit.   Past Medical History:  Diagnosis Date   Anemia    Angina pectoris ([place]) 02/06/2018   Angina, class III ([place]) 02/06/2018   Anginal equivalent 09/19/2019   CAD (coronary artery disease)    s/p stent to CFX 1997 2/2 MI s/p DES to [facility] and DES mLAD 07/14/10 2/2 [place] cath 07/14/10: dLAD 60%; CFX stent ok; OM1 30-40%; pRCA 30%; PDA 40%; EF 60%   Constant exophthalmos    right   Coronary artery disease of native artery of native heart with stable angina pectoris ([place]) 07/21/2010   Qualifier: Diagnosis of  By: Denny Peon, CMA, [name]     Epistaxis 07/21/2010   Qualifier: Diagnosis of  By: Jorene Minors, Scott     Hearing loss    right eye - no hearing aids   Hyperlipidemia    MI (myocardial infarction) ([place])    Mixed hyperlipidemia 07/21/2010    Qualifier: Diagnosis of  By: Orville Govern, CMA, Carol     Old myocardial infarction 07/21/2010   Qualifier: Diagnosis of  By: Orville Govern CMA, Carol     Osteoarthritis    OSTEOARTHRITIS 07/21/2010   Qualifier: Diagnosis of  By: Orville Govern CMA, Carol     PMR (polymyalgia rheumatica) ([place]) 04/03/2021   S/P CABG x 3 02/05/2020   Shortness of breath 07/21/2010   Qualifier: Diagnosis of  By: Jorene Minors, Scott     Thyroid disease    TOBACCO USE, QUIT 07/21/2010   Qualifier: Diagnosis of  By: Orville Govern, CMA, Carol     Transient visual loss, right    wears glasses   UNSTABLE ANGINA 07/21/2010   Qualifier: Diagnosis of  By: Orville Govern, CMA, Carol     Upper respiratory tract infection due to COVID-19 virus 05/14/2021   Past Surgical History:  Procedure Laterality Date   CARDIAC CATHETERIZATION     CORONARY ARTERY BYPASS GRAFT N/A 02/05/2020   Procedure: CORONARY ARTERY BYPASS GRAFTING (CABG), ON PUMP, TIMES THREE, USING LEFT INTERNAL MAMMARY ARTERY TO LEFT ANTERIOR DESCENDING ARTERY, REVERSE SAPHENOUS VEIN GRAFT TO POSTERIOR DESCENDING ARTERY AND CIRCUMFLEX ARTERY;  Surgeon: Grace Isaac, MD;  Location: [place];  Service: Open Heart Surgery;  Laterality: N/A;   ENDOVEIN HARVEST OF GREATER SAPHENOUS VEIN Right  02/05/2020   Procedure: ENDOVEIN HARVEST OF GREATER SAPHENOUS VEIN;  Surgeon: Grace Isaac, MD;  Location: [place];  Service: Open Heart Surgery;  Laterality: Right;   LEFT HEART CATH AND CORONARY ANGIOGRAPHY N/A 01/25/2020   Procedure: LEFT HEART CATH AND CORONARY ANGIOGRAPHY;  Surgeon: Leonie Man, MD;  Location: [place] [facility];  Service: Cardiovascular;  Laterality: N/A;   TEE WITHOUT CARDIOVERSION N/A 02/05/2020   Procedure: TRANSESOPHAGEAL ECHOCARDIOGRAM (TEE);  Surgeon: Grace Isaac, MD;  Location: [place];  Service: Open Heart Surgery;  Laterality: N/A;   TONSILLECTOMY AND ADENOIDECTOMY     WISDOM TOOTH EXTRACTION      Family History  Problem Relation Age of Onset   Heart failure  Mother    Alzheimer's disease Mother    Alzheimer's disease Sister    Coronary artery disease Other    Social History   Socioeconomic History   Marital status: Married    Spouse name: Not on file   Number of children: 2   Years of education: 14   Highest education level: Not on file  Occupational History   Not on file  Tobacco Use   Smoking status: Former    Packs/day: 1.50    Years: 37.00    Total pack years: 55.50    Types: Cigarettes    Quit date: 05/2000    Years since quitting: 22.1   Smokeless tobacco: Never  Vaping Use   Vaping Use: Never used  Substance and Sexual Activity   Alcohol use: No   Drug use: No   Sexual activity: Not Currently  Other Topics Concern   Not on file  Social History Narrative   He lives in [place] with family.  He is a retired  Games developer.  He quit tobacco in 1997 with approximately 30-pack-  year history and denies alcohol or drug abuse.   Right handed   Drinks caffeine   Two story home      Social Determinants of Health   Financial Resource Strain: Not on file  Food Insecurity: Not on file  Transportation Needs: Not on file  Physical Activity: Sufficiently Active (05/18/2020)   Exercise Vital Sign    Days of Exercise per Week: 5 days    Minutes of Exercise per Session: 30 min  Stress: Stress Concern Present (05/18/2020)   [place]    Feeling of Stress : To some extent  Social Connections: Unknown (05/18/2020)   Social Connection and Isolation Panel [NHANES]    Frequency of Communication with Friends and Family: Not on file    Frequency of Social Gatherings with Friends and Family: Not on file    Attends Religious Services: Not on file    Active Member of Clubs or Organizations: Not on file    Attends Archivist Meetings: Not on file    Marital Status: Married    Review of Systems  Constitutional:  Negative for chills, fatigue, fever and  unexpected weight change.  HENT:  Negative for congestion, ear pain, sinus pain and sore throat.   Respiratory:  Negative for cough and shortness of breath.   Cardiovascular:  Negative for chest pain and palpitations.  Gastrointestinal:  Negative for abdominal pain, blood in stool, constipation, diarrhea, nausea and vomiting.  Endocrine: Negative for polydipsia.  Genitourinary:  Negative for dysuria.  Musculoskeletal:  Negative for back pain.  Skin:  Negative for rash.  Neurological:  Negative for headaches.  Psychiatric/Behavioral:  Negative for dysphoric mood. The patient is not nervous/anxious.      Objective:  BP 116/68   Pulse 80   Temp (!) 96.5 F (35.8 C)   Ht '5\' 7"'$  (1.702 m)   Wt 210 lb (95.3 kg)   BMI 32.89 kg/m      06/22/2022   10:15 AM 05/10/2022    9:50 AM 04/20/2022    1:35 PM  BP/Weight  Systolic BP 416 606 301  Diastolic BP 68 52 62  Wt. (Lbs) 210 213 214  BMI 32.89 kg/m2 33.36 kg/m2 33.52 kg/m2    Physical Exam  Diabetic Foot Exam - Simple   No data filed      Lab Results  Component Value Date   WBC 5.2 06/16/2022   HGB 13.0 06/16/2022   HCT 38.8 06/16/2022   PLT 277 06/16/2022   GLUCOSE 92 06/16/2022   CHOL 102 06/16/2022   TRIG 143 06/16/2022   HDL 31 (L) 06/16/2022   LDLDIRECT 152.5 10/05/2010   LDLCALC 46 06/16/2022   ALT 13 06/16/2022   AST 12 06/16/2022   NA 134 06/16/2022   K 4.4 06/16/2022   CL 97 06/16/2022   CREATININE 1.03 06/16/2022   BUN 19 06/16/2022   CO2 23 06/16/2022   TSH 1.230 03/15/2022   INR 1.4 (H) 02/05/2020   HGBA1C 6.3 (H) 06/16/2022      Assessment & Plan:   Problem List Items Addressed This Visit       Cardiovascular and Mediastinum   Coronary artery disease of native artery of native heart with stable angina pectoris ([place]) - Primary (Chronic)    Management per specialist.       Relevant Medications   nitroGLYCERIN (NITROSTAT) 0.4 MG SL tablet     Endocrine   Secondary hypothyroidism     Previously well controlled Continue Synthroid at current dose  Recheck TSH and adjust Synthroid as indicated        Relevant Orders   TSH     Nervous and Auditory   Dementia due to Alzheimer's disease ([place])    The current medical regimen is effective;  continue present plan and medications.       Relevant Medications   donepezil (ARICEPT) 10 MG tablet     Other   Mixed hyperlipidemia    Well controlled.  No taking medicines.  Continue to work on eating a healthy diet and exercise.  Labs drawn today.        Relevant Medications   nitroGLYCERIN (NITROSTAT) 0.4 MG SL tablet   Prediabetes    Hemoglobin A1c 6.3%, 3 month avg of blood sugars, is in prediabetic range.  In order to prevent progression to diabetes, recommend low carb diet and regular exercise       Other Visit Diagnoses     PAD (peripheral artery disease) ([place])       Relevant Medications   nitroGLYCERIN (NITROSTAT) 0.4 MG SL tablet   Other Relevant Orders   Ambulatory referral to Vascular Surgery     .  Meds ordered this encounter  Medications   nitroGLYCERIN (NITROSTAT) 0.4 MG SL tablet    Sig: Place 1 tablet (0.4 mg total) under the tongue every 5 (five) minutes as needed. If require 2, recommend call 911.    Dispense:  50 tablet    Refill:  0   donepezil (ARICEPT) 10 MG tablet    Sig: Take 1 tablet (10 mg total) by mouth at bedtime.    Dispense:  90 tablet    Refill:  1    Orders Placed This Encounter  Procedures   TSH   Ambulatory referral to Vascular Surgery     Follow-up: Return in about 4 months (around 10/21/2022) for chronic follow up.  An After Visit Summary was printed and given to the patient.  Geralynn Ochs I Leal-Borjas,acting as a scribe for Rochel Brome, MD.,have documented all relevant documentation on the behalf of Rochel Brome, MD,as directed by  Rochel Brome, MD while in the presence of Rochel Brome, MD.    Rochel Brome, MD [place] [phone]

## 2022-06-24 ENCOUNTER — Other Ambulatory Visit: Payer: Self-pay

## 2022-06-24 MED ORDER — METFORMIN HCL 500 MG PO TABS: 500.0000 mg | ORAL_TABLET | Freq: Every day | ORAL | 0 refills | Status: DC

## 2022-06-25 ENCOUNTER — Other Ambulatory Visit: Payer: Self-pay | Admitting: *Deleted

## 2022-06-25 DIAGNOSIS — I739 Peripheral vascular disease, unspecified: Secondary | ICD-10-CM

## 2022-06-26 NOTE — Assessment & Plan Note (Signed)
Management per specialist. 

## 2022-06-26 NOTE — Assessment & Plan Note (Signed)
The current medical regimen is effective;  continue present plan and medications.

## 2022-06-26 NOTE — Assessment & Plan Note (Signed)
Hemoglobin A1c 6.3%, 3 month avg of blood sugars, is in prediabetic range.  In order to prevent progression to diabetes, recommend low carb diet and regular exercise  

## 2022-06-26 NOTE — Assessment & Plan Note (Signed)
Previously well controlled Continue Synthroid at current dose  Recheck TSH and adjust Synthroid as indicated   

## 2022-06-26 NOTE — Assessment & Plan Note (Signed)
Well controlled.  No taking medicines.  Continue to work on eating a healthy diet and exercise.  Labs drawn today.

## 2022-06-27 ENCOUNTER — Encounter: Payer: Self-pay | Admitting: Family Medicine

## 2022-06-27 NOTE — Assessment & Plan Note (Signed)
Intolerant to statins. 

## 2022-06-28 ENCOUNTER — Other Ambulatory Visit: Payer: Self-pay | Admitting: Family Medicine

## 2022-07-08 ENCOUNTER — Ambulatory Visit (HOSPITAL_COMMUNITY)
Admission: RE | Admit: 2022-07-08 | Discharge: 2022-07-08 | Disposition: A | Discharge status: Home / Self Care | Payer: Medicare HMO | Source: Ambulatory Visit | Attending: Vascular Surgery | Admitting: Vascular Surgery

## 2022-07-08 ENCOUNTER — Ambulatory Visit (INDEPENDENT_AMBULATORY_CARE_PROVIDER_SITE_OTHER): Payer: Medicare HMO | Admitting: Physician Assistant

## 2022-07-08 VITALS — BP 105/63 | HR 68 | Temp 97.7°F | Ht 67.0 in | Wt 214.0 lb

## 2022-07-08 DIAGNOSIS — I739 Peripheral vascular disease, unspecified: Secondary | ICD-10-CM | POA: Diagnosis not present

## 2022-07-08 DIAGNOSIS — R0989 Other specified symptoms and signs involving the circulatory and respiratory systems: Secondary | ICD-10-CM

## 2022-07-08 LAB — VAS US ABI WITH/WO TBI
Left ABI: 0.91
Right ABI: 0.95

## 2022-07-08 NOTE — Progress Notes (Signed)
HISTORY AND PHYSICAL     CC:  follow up. Requesting Provider:  Rochel Brome, MD  HPI: This is a 82 y.o. male who is here today for follow up for PAD.  Pt was seen by Dr. Oneida Alar in March 2022 for a several year history of tightness sensation in both calves after walking.  It had been slowly progressing since his CABG in 1997 and redo CABG in 2021.  He was walking about a block before he got cramping in both calves.   He has hx of right GSV harvest for CABG.  He also has hx of DM, COPD.  His wife states he has been diagnosed with early Alzheimer's.  The pt returns today for follow up and here with his wife of 80 years.  He states that he has some weakness in his legs.  He denies any claudication.  He states he used to walk the neighborhood, which was 1.7 miles but he can't do that any longer.  His wife states if they go to [place], he has to use a wheelchair or the motor scooter.  He denies any rest pain or non healing wounds.  He describes his legs as just weak.  He states that since his 2nd heart surgery, they have not been the same.  He states that he has had some sciatica in the past but doesn't really have any back issues currently that he is aware of.    He denies any amaurosis fugax, speech difficulties or one sided weakness, numbness or paralysis.   He states that his mother had Alzheimer's and may have had the artery in her neck "cleaned out".  The pt is on a statin for cholesterol management.    The pt is on an aspirin.    Other AC:  none The pt is not on medication for hypertension.  The pt does  have diabetes. Tobacco hx:  former   Past Medical History:  Diagnosis Date   Anemia    Angina pectoris ([place]) 02/06/2018   Angina, class III ([place]) 02/06/2018   Anginal equivalent 09/19/2019   CAD (coronary artery disease)    s/p stent to CFX 1997 2/2 MI s/p DES to [facility] and DES mLAD 07/14/10 2/2 [place] cath 07/14/10: dLAD 60%; CFX stent ok; OM1 30-40%; pRCA 30%; PDA 40%; EF 60%    Constant exophthalmos    right   Coronary artery disease of native artery of native heart with stable angina pectoris ([place]) 07/21/2010   Qualifier: Diagnosis of  By: Denny Peon, CMA, [name]     Epistaxis 07/21/2010   Qualifier: Diagnosis of  By: Jorene Minors, Scott     Hearing loss    right eye - no hearing aids   Hyperlipidemia    MI (myocardial infarction) ([place])    Mixed hyperlipidemia 07/21/2010   Qualifier: Diagnosis of  By: Orville Govern CMA, Carol     Old myocardial infarction 07/21/2010   Qualifier: Diagnosis of  By: Orville Govern CMA, Carol     Osteoarthritis    OSTEOARTHRITIS 07/21/2010   Qualifier: Diagnosis of  By: Orville Govern CMA, Carol     PMR (polymyalgia rheumatica) ([place]) 04/03/2021   S/P CABG x 3 02/05/2020   Shortness of breath 07/21/2010   Qualifier: Diagnosis of  By: Jorene Minors, Scott     Thyroid disease    TOBACCO USE, QUIT 07/21/2010   Qualifier: Diagnosis of  By: Orville Govern, CMA, Carol     Transient visual loss, right    wears glasses  UNSTABLE ANGINA 07/21/2010   Qualifier: Diagnosis of  By: Orville Govern, CMA, Carol     Upper respiratory tract infection due to COVID-19 virus 05/14/2021    Past Surgical History:  Procedure Laterality Date   CARDIAC CATHETERIZATION     CORONARY ARTERY BYPASS GRAFT N/A 02/05/2020   Procedure: CORONARY ARTERY BYPASS GRAFTING (CABG), ON PUMP, TIMES THREE, USING LEFT INTERNAL MAMMARY ARTERY TO LEFT ANTERIOR DESCENDING ARTERY, REVERSE SAPHENOUS VEIN GRAFT TO POSTERIOR DESCENDING ARTERY AND CIRCUMFLEX ARTERY;  Surgeon: Grace Isaac, MD;  Location: [place];  Service: Open Heart Surgery;  Laterality: N/A;   ENDOVEIN HARVEST OF GREATER SAPHENOUS VEIN Right 02/05/2020   Procedure: ENDOVEIN HARVEST OF GREATER SAPHENOUS VEIN;  Surgeon: Grace Isaac, MD;  Location: [place];  Service: Open Heart Surgery;  Laterality: Right;   LEFT HEART CATH AND CORONARY ANGIOGRAPHY N/A 01/25/2020   Procedure: LEFT HEART CATH AND CORONARY ANGIOGRAPHY;  Surgeon: Leonie Man, MD;   Location: [place] [facility];  Service: Cardiovascular;  Laterality: N/A;   TEE WITHOUT CARDIOVERSION N/A 02/05/2020   Procedure: TRANSESOPHAGEAL ECHOCARDIOGRAM (TEE);  Surgeon: Grace Isaac, MD;  Location: [place];  Service: Open Heart Surgery;  Laterality: N/A;   TONSILLECTOMY AND ADENOIDECTOMY     WISDOM TOOTH EXTRACTION      Allergies  Allergen Reactions   Bactrim [Sulfamethoxazole-Trimethoprim] Shortness Of Breath    Breathing problems.  Rt ear - hearing loss.    Codeine Nausea And Vomiting   Statins Other (See Comments)    Patient preference to not take statins/cholesterol meds    Current Outpatient Medications  Medication Sig Dispense Refill   aspirin EC 81 MG tablet Take 1 tablet (81 mg total) by mouth daily. Swallow whole. 90 tablet 3   donepezil (ARICEPT) 10 MG tablet Take 1 tablet (10 mg total) by mouth at bedtime. 90 tablet 1   Evolocumab (REPATHA) 140 MG/ML SOSY INJECT '140MG'$  UNDER THE SKIN EVERY 2 WEEKS 6 mL 1   levothyroxine (SYNTHROID) 25 MCG tablet TAKE 1 TABLET EVERY DAY BEFORE BREAKFAST 90 tablet 0   memantine (NAMENDA) 10 MG tablet TAKE 1 TABLET TWICE DAILY 180 tablet 1   metFORMIN (GLUCOPHAGE) 500 MG tablet Take 1 tablet (500 mg total) by mouth daily with breakfast. 90 tablet 0   nitroGLYCERIN (NITROSTAT) 0.4 MG SL tablet Place 1 tablet (0.4 mg total) under the tongue every 5 (five) minutes as needed. If require 2, recommend call 911. 50 tablet 0   pantoprazole (PROTONIX) 40 MG tablet Take 40 mg by mouth daily as needed (chest pain).     No current facility-administered medications for this visit.    Family History  Problem Relation Age of Onset   Heart failure Mother    Alzheimer's disease Mother    Alzheimer's disease Sister    Coronary artery disease Other     Social History   Socioeconomic History   Marital status: Married    Spouse name: Not on file   Number of children: 2   Years of education: 14   Highest education level: Not on file   Occupational History   Not on file  Tobacco Use   Smoking status: Former    Packs/day: 1.50    Years: 37.00    Total pack years: 55.50    Types: Cigarettes    Quit date: 05/2000    Years since quitting: 22.1   Smokeless tobacco: Never  Vaping Use   Vaping Use: Never used  Substance and Sexual  Activity   Alcohol use: No   Drug use: No   Sexual activity: Not Currently  Other Topics Concern   Not on file  Social History Narrative   He lives in [place] with family.  He is a retired  Games developer.  He quit tobacco in 1997 with approximately 30-pack-  year history and denies alcohol or drug abuse.   Right handed   Drinks caffeine   Two story home      Social Determinants of Health   Financial Resource Strain: Not on file  Food Insecurity: Not on file  Transportation Needs: Not on file  Physical Activity: Sufficiently Active (05/18/2020)   Exercise Vital Sign    Days of Exercise per Week: 5 days    Minutes of Exercise per Session: 30 min  Stress: Stress Concern Present (05/18/2020)   [place]    Feeling of Stress : To some extent  Social Connections: Unknown (05/18/2020)   Social Connection and Isolation Panel [NHANES]    Frequency of Communication with Friends and Family: Not on file    Frequency of Social Gatherings with Friends and Family: Not on file    Attends Religious Services: Not on Advertising copywriter or Organizations: Not on file    Attends Archivist Meetings: Not on file    Marital Status: Married  Human resources officer Violence: Not on file     REVIEW OF SYSTEMS:   '[X]'$  denotes positive finding, '[ ]'$  denotes negative finding Cardiac  Comments:  Chest pain or chest pressure:    Shortness of breath upon exertion:    Short of breath when lying flat:    Irregular heart rhythm:        Vascular    Pain in calf, thigh, or hip brought on by ambulation: x See HPI  Pain in  feet at night that wakes you up from your sleep:     Blood clot in your veins:    Leg swelling:         Pulmonary    Oxygen at home:    Productive cough:     Wheezing:         Neurologic    Sudden weakness in arms or legs:     Sudden numbness in arms or legs:     Sudden onset of difficulty speaking or slurred speech:    Temporary loss of vision in one eye:     Problems with dizziness:         Gastrointestinal    Blood in stool:     Vomited blood:         Genitourinary    Burning when urinating:     Blood in urine:        Psychiatric    Major depression:         Hematologic    Bleeding problems:    Problems with blood clotting too easily:        Skin    Rashes or ulcers:        Constitutional    Fever or chills:      PHYSICAL EXAMINATION:  Today's Vitals   07/08/22 1252  BP: 105/63  Pulse: 68  Temp: 97.7 F (36.5 C)  TempSrc: Temporal  SpO2: 96%  Weight: 214 lb (97.1 kg)  Height: '5\' 7"'$  (1.702 m)  PainSc: 2    Body mass index is 33.52 kg/m.   General:  WDWN in NAD; vital signs documented above Gait: Not observed HENT: WNL, normocephalic Pulmonary: normal non-labored breathing , without wheezing Cardiac: regular HR, with carotid bruit on the right Skin: without rashes Vascular Exam/Pulses:  Right Left  Radial 2+ (normal) 2+ (normal)  Popliteal Unable to palpate Unable to palpate  DP 2+ (normal) 2+ (normal)  PT Unable to palpate Unable to palpate   Extremities: without ischemic changes, without Gangrene , without cellulitis; without open wounds Musculoskeletal: no muscle wasting or atrophy  Neurologic: A&O X 3 Psychiatric:  The pt has Normal affect.   Non-Invasive Vascular Imaging:   ABI's/TBI's on 07/08/2022: Right:  0.95/0.63 - Great toe pressure: 73 Left:  0.91/0.55 - Great toe pressure: 64   Previous ABI 07/29/2020: Right:  0.89 Left:  0.79  Previous carotid duplex 2021: Right:  1-39% Left:  1-39%    ASSESSMENT/PLAN:: 82 y.o.  male here for follow up for PAD with hx of decreased ABI's in the past and has hx of CABG and redo CABG.  PAD -pt ABI improved from previous study and he has palpable DP pulses bilaterally.  He does not endorse any rest pain or claudication.  Unlikely his weakness in his legs are due to arterial insufficiency.  -continue statin (rephatha) and asa  Right carotid bruit -pt asymptomatic and duplex in 2021 revealed 1-39% bilateral ICA stenosis.  Given he has a right carotid bruit, will have him return over the next few weeks at his convenience for carotid duplex.  We discussed s/s of stroke and he knows that if he develops any of these, he should go to the ER.  He expressed understanding.    Leontine Locket, [facility] [phone]  Clinic MD:   Scot Dock

## 2022-07-12 ENCOUNTER — Other Ambulatory Visit: Payer: Self-pay | Admitting: *Deleted

## 2022-07-12 DIAGNOSIS — R0989 Other specified symptoms and signs involving the circulatory and respiratory systems: Secondary | ICD-10-CM

## 2022-07-21 NOTE — Progress Notes (Unsigned)
HISTORY AND PHYSICAL     CC:  follow up. Requesting Provider:  Rochel Brome, MD  HPI: This is a 82 y.o. male who is here today for follow up.   Pt was seen by Dr. Oneida Alar in March 2022 for a several year history of tightness sensation in both calves after walking.  It had been slowly progressing since his CABG in 1997 and redo CABG in 2021.  He was walking about a block before he got cramping in both calves.    He has hx of right GSV harvest for CABG.  He also has hx of DM, COPD.  His wife states he has been diagnosed with early Alzheimer's.  Pt was last seen on 07/08/2022.  At that time, his ABI's were improved.  He had a right carotid bruit and was brought back for carotid duplex.  Previous duplex in 2021 revealed 1-39% bilateral ICA stenosis.  Pt has been asymptomatic.    The pt returns today for carotid duplex after a right carotid bruit was heard at last visit.   Pt denies any amaurosis fugax, speech difficulties, weakness, numbness, paralysis or clumsiness or facial droop.  He states that he has some dementia and does puzzles in the [place] [employer] to help keep his mind right.   The pt is on a statin for cholesterol management.    The pt is on an aspirin.    Other AC:  none The pt is not on medication for hypertension.  The pt does  have diabetes. Tobacco hx:  former   Past Medical History:  Diagnosis Date   Anemia    Angina pectoris ([place]) 02/06/2018   Angina, class III ([place]) 02/06/2018   Anginal equivalent 09/19/2019   CAD (coronary artery disease)    s/p stent to CFX 1997 2/2 MI s/p DES to [facility] and DES mLAD 07/14/10 2/2 [place] cath 07/14/10: dLAD 60%; CFX stent ok; OM1 30-40%; pRCA 30%; PDA 40%; EF 60%   Constant exophthalmos    right   Coronary artery disease of native artery of native heart with stable angina pectoris ([place]) 07/21/2010   Qualifier: Diagnosis of  By: Denny Peon, CMA, [name]     Epistaxis 07/21/2010   Qualifier: Diagnosis of  By: Jorene Minors, Scott     Hearing loss     right eye - no hearing aids   Hyperlipidemia    MI (myocardial infarction) ([place])    Mixed hyperlipidemia 07/21/2010   Qualifier: Diagnosis of  By: Orville Govern, CMA, Carol     Old myocardial infarction 07/21/2010   Qualifier: Diagnosis of  By: Orville Govern CMA, Carol     Osteoarthritis    OSTEOARTHRITIS 07/21/2010   Qualifier: Diagnosis of  By: Orville Govern CMA, Carol     PMR (polymyalgia rheumatica) ([place]) 04/03/2021   S/P CABG x 3 02/05/2020   Shortness of breath 07/21/2010   Qualifier: Diagnosis of  By: Jorene Minors, Scott     Thyroid disease    TOBACCO USE, QUIT 07/21/2010   Qualifier: Diagnosis of  By: Orville Govern, CMA, Carol     Transient visual loss, right    wears glasses   UNSTABLE ANGINA 07/21/2010   Qualifier: Diagnosis of  By: Orville Govern, CMA, Carol     Upper respiratory tract infection due to COVID-19 virus 05/14/2021    Past Surgical History:  Procedure Laterality Date   CARDIAC CATHETERIZATION     CORONARY ARTERY BYPASS GRAFT N/A 02/05/2020   Procedure: CORONARY ARTERY BYPASS GRAFTING (CABG), ON PUMP, TIMES THREE, USING  LEFT INTERNAL MAMMARY ARTERY TO LEFT ANTERIOR DESCENDING ARTERY, REVERSE SAPHENOUS VEIN GRAFT TO POSTERIOR DESCENDING ARTERY AND CIRCUMFLEX ARTERY;  Surgeon: Grace Isaac, MD;  Location: [place];  Service: Open Heart Surgery;  Laterality: N/A;   ENDOVEIN HARVEST OF GREATER SAPHENOUS VEIN Right 02/05/2020   Procedure: ENDOVEIN HARVEST OF GREATER SAPHENOUS VEIN;  Surgeon: Grace Isaac, MD;  Location: [place];  Service: Open Heart Surgery;  Laterality: Right;   LEFT HEART CATH AND CORONARY ANGIOGRAPHY N/A 01/25/2020   Procedure: LEFT HEART CATH AND CORONARY ANGIOGRAPHY;  Surgeon: Leonie Man, MD;  Location: [place] [facility];  Service: Cardiovascular;  Laterality: N/A;   TEE WITHOUT CARDIOVERSION N/A 02/05/2020   Procedure: TRANSESOPHAGEAL ECHOCARDIOGRAM (TEE);  Surgeon: Grace Isaac, MD;  Location: [place];  Service: Open Heart Surgery;  Laterality: N/A;   TONSILLECTOMY  AND ADENOIDECTOMY     WISDOM TOOTH EXTRACTION      Allergies  Allergen Reactions   Bactrim [Sulfamethoxazole-Trimethoprim] Shortness Of Breath    Breathing problems.  Rt ear - hearing loss.    Codeine Nausea And Vomiting   Statins Other (See Comments)    Patient preference to not take statins/cholesterol meds    Current Outpatient Medications  Medication Sig Dispense Refill   aspirin EC 81 MG tablet Take 1 tablet (81 mg total) by mouth daily. Swallow whole. 90 tablet 3   donepezil (ARICEPT) 10 MG tablet Take 1 tablet (10 mg total) by mouth at bedtime. 90 tablet 1   Evolocumab (REPATHA) 140 MG/ML SOSY INJECT '140MG'$  UNDER THE SKIN EVERY 2 WEEKS 6 mL 1   levothyroxine (SYNTHROID) 25 MCG tablet TAKE 1 TABLET EVERY DAY BEFORE BREAKFAST 90 tablet 0   memantine (NAMENDA) 10 MG tablet TAKE 1 TABLET TWICE DAILY 180 tablet 1   metFORMIN (GLUCOPHAGE) 500 MG tablet Take 1 tablet (500 mg total) by mouth daily with breakfast. 90 tablet 0   nitroGLYCERIN (NITROSTAT) 0.4 MG SL tablet Place 1 tablet (0.4 mg total) under the tongue every 5 (five) minutes as needed. If require 2, recommend call 911. 50 tablet 0   pantoprazole (PROTONIX) 40 MG tablet Take 40 mg by mouth daily as needed (chest pain).     No current facility-administered medications for this visit.    Family History  Problem Relation Age of Onset   Heart failure Mother    Alzheimer's disease Mother    Alzheimer's disease Sister    Coronary artery disease Other     Social History   Socioeconomic History   Marital status: Married    Spouse name: Not on file   Number of children: 2   Years of education: 14   Highest education level: Not on file  Occupational History   Not on file  Tobacco Use   Smoking status: Former    Packs/day: 1.50    Years: 37.00    Total pack years: 55.50    Types: Cigarettes    Quit date: 05/2000    Years since quitting: 22.2   Smokeless tobacco: Never  Vaping Use   Vaping Use: Never used   Substance and Sexual Activity   Alcohol use: No   Drug use: No   Sexual activity: Not Currently  Other Topics Concern   Not on file  Social History Narrative   He lives in [place] with family.  He is a retired  Games developer.  He quit tobacco in 1997 with approximately 30-pack-  year history and denies alcohol or drug  abuse.   Right handed   Drinks caffeine   Two story home      Social Determinants of Health   Financial Resource Strain: Not on file  Food Insecurity: Not on file  Transportation Needs: Not on file  Physical Activity: Sufficiently Active (05/18/2020)   Exercise Vital Sign    Days of Exercise per Week: 5 days    Minutes of Exercise per Session: 30 min  Stress: Stress Concern Present (05/18/2020)   [place]    Feeling of Stress : To some extent  Social Connections: Unknown (05/18/2020)   Social Connection and Isolation Panel [NHANES]    Frequency of Communication with Friends and Family: Not on file    Frequency of Social Gatherings with Friends and Family: Not on file    Attends Religious Services: Not on Advertising copywriter or Organizations: Not on file    Attends Archivist Meetings: Not on file    Marital Status: Married  Human resources officer Violence: Not on file     REVIEW OF SYSTEMS:   '[X]'$  denotes positive finding, '[ ]'$  denotes negative finding Cardiac  Comments:  Chest pain or chest pressure:    Shortness of breath upon exertion:    Short of breath when lying flat:    Irregular heart rhythm:        Vascular    Pain in calf, thigh, or hip brought on by ambulation:    Pain in feet at night that wakes you up from your sleep:     Blood clot in your veins:    Leg swelling:         Pulmonary    Oxygen at home:    Wheezing:         Neurologic    Sudden weakness in arms or legs:     Sudden numbness in arms or legs:     Sudden onset of difficulty speaking or  understanding others    Temporary loss of vision in one eye:     Problems with dizziness:         Gastrointestinal    Blood in stool:     Vomited blood:         Genitourinary    Burning when urinating:     Blood in urine:        Psychiatric    Major depression:         Hematologic    Bleeding problems:    Problems with blood clotting too easily:        Skin    Rashes or ulcers:        Constitutional    Fever or chills:      PHYSICAL EXAMINATION:  Today's Vitals   07/22/22 1256 07/22/22 1258  BP: (!) 103/58 105/61  Pulse: 73   Temp: 97.8 F (36.6 C)   TempSrc: Temporal   SpO2: 97%   Weight: 212 lb (96.2 kg)   Height: '5\' 7"'$  (1.702 m)   PainSc: 4     Body mass index is 33.2 kg/m.   General:  WDWN in NAD; vital signs documented above Gait: Not observed HENT: WNL, normocephalic Pulmonary: normal non-labored breathing  Cardiac: regular HR;  with carotid bruit on right Skin: without rashes Vascular Exam/Pulses: 2+ radial pulses bilaterally Extremities: hand grips equal  bilaterally Musculoskeletal: no muscle wasting or atrophy  Neurologic: A&O X 3;  speech is fluent/normal; moving all  extremities equally  Psychiatric:  The pt has Normal affect.   Non-Invasive Vascular Imaging:   Non-Invasive Vascular Imaging:   Carotid Duplex on 07/22/2022: Right:  1-30% ICA stenosis Left:  1-39% ICA stenosis  ABI's/TBI's on 07/08/2022: Right:  0.95/0.63 - Great toe pressure: 73 Left:  0.91/0.55 - Great toe pressure: 64   Previous ABI 07/29/2020: Right:  0.89 Left:  0.79   Previous carotid duplex 2021: Right:  1-39% Left:  1-39%   ASSESSMENT/PLAN:: 82 y.o. male here for follow up for PAD and carotid stenosis with hx of  hx of decreased ABI's in the past and has hx of CABG and redo CABG.  He had carotid stenosis of 1-39% bilaterally in 2021 and recently right carotid bruit was heard   PAD -pt was seen last month and ABI improved from previous study and no rest pain  or claudication.  Carotid stenosis -duplex today reveals bilateral 1-39% ICA stenosis.  He remains asymptomatic.   -discussed s/s of stroke with pt and he understands should he develop any of these sx, he will go to the nearest ER or call 911.  -continue statin/plavix -keep previous follow up appt.   Leontine Locket, [facility] [phone]  Clinic MD:   Donzetta Matters

## 2022-07-22 ENCOUNTER — Ambulatory Visit (INDEPENDENT_AMBULATORY_CARE_PROVIDER_SITE_OTHER): Payer: Medicare HMO | Admitting: Physician Assistant

## 2022-07-22 ENCOUNTER — Ambulatory Visit (HOSPITAL_COMMUNITY)
Admission: RE | Admit: 2022-07-22 | Discharge: 2022-07-22 | Disposition: A | Discharge status: Home / Self Care | Payer: Medicare HMO | Source: Ambulatory Visit | Attending: Vascular Surgery | Admitting: Vascular Surgery

## 2022-07-22 VITALS — BP 105/61 | HR 73 | Temp 97.8°F | Ht 67.0 in | Wt 212.0 lb

## 2022-07-22 DIAGNOSIS — R0989 Other specified symptoms and signs involving the circulatory and respiratory systems: Secondary | ICD-10-CM

## 2022-07-26 ENCOUNTER — Encounter: Payer: Self-pay | Admitting: Psychology

## 2022-07-26 DIAGNOSIS — H903 Sensorineural hearing loss, bilateral: Secondary | ICD-10-CM | POA: Insufficient documentation

## 2022-07-26 DIAGNOSIS — D333 Benign neoplasm of cranial nerves: Secondary | ICD-10-CM | POA: Insufficient documentation

## 2022-07-26 DIAGNOSIS — E079 Disorder of thyroid, unspecified: Secondary | ICD-10-CM | POA: Insufficient documentation

## 2022-07-27 ENCOUNTER — Ambulatory Visit: Payer: Medicare HMO

## 2022-07-27 ENCOUNTER — Encounter: Payer: Self-pay | Admitting: Psychology

## 2022-07-27 ENCOUNTER — Ambulatory Visit: Payer: Medicare HMO | Admitting: Psychology

## 2022-07-27 DIAGNOSIS — G3184 Mild cognitive impairment, so stated: Secondary | ICD-10-CM | POA: Insufficient documentation

## 2022-07-27 DIAGNOSIS — Z951 Presence of aortocoronary bypass graft: Secondary | ICD-10-CM | POA: Diagnosis not present

## 2022-07-27 DIAGNOSIS — R4189 Other symptoms and signs involving cognitive functions and awareness: Secondary | ICD-10-CM

## 2022-07-27 HISTORY — DX: Mild cognitive impairment of uncertain or unknown etiology: G31.84

## 2022-07-27 NOTE — Progress Notes (Signed)
   Psychometrician Note   Cognitive testing was administered to Jeffery Turner by Cruzita Lederer, B.S. (psychometrist) under the supervision of Dr. Christia Reading, Ph.D., licensed psychologist on 07/27/2022. Jeffery Turner did not appear overtly distressed by the testing session per behavioral observation or responses across self-report questionnaires. Rest breaks were offered.    The battery of tests administered was selected by Dr. Christia Reading, Ph.D. with consideration to Jeffery Turner current level of functioning, the nature of his symptoms, emotional and behavioral responses during interview, level of literacy, observed level of motivation/effort, and the nature of the referral question. This battery was communicated to the psychometrist. Communication between Dr. Christia Reading, Ph.D. and the psychometrist was ongoing throughout the evaluation and Dr. Christia Reading, Ph.D. was immediately accessible at all times. Dr. Christia Reading, Ph.D. provided supervision to the psychometrist on the date of this service to the extent necessary to assure the quality of all services provided.    Jeffery Turner will return within approximately 1-2 weeks for an interactive feedback session with [name] at which time his test performances, clinical impressions, and treatment recommendations will be reviewed in detail. Jeffery Turner understands he can contact our office should he require our assistance before this time.  A total of 125 minutes of billable time were spent face-to-face with Jeffery Turner by the psychometrist. This includes both test administration and scoring time. Billing for these services is reflected in the clinical report generated by Dr. Christia Reading, Ph.D.  This note reflects time spent with the psychometrician and does not include test scores or any clinical interpretations made by [name]. The full report will follow in a separate note.

## 2022-07-27 NOTE — Progress Notes (Signed)
NEUROPSYCHOLOGICAL EVALUATION [place]  Date of Evaluation: July 27, 2022  Reason for Referral:   Jeffery Turner is a 82 y.o. right-handed Caucasian male referred by Sharene Butters, PA-C, to characterize his current cognitive functioning and assist with diagnostic clarity and treatment planning in the context of subjective cognitive decline.   Assessment and Plan:   Clinical Impression(s): Jeffery Turner pattern of performance is suggestive of performance variability but overall weakness surrounding delayed retrieval of novel information. Additional variability was exhibited encoding (i.e., learning) novel verbal information. Performances were appropriate relative to age-matched peers across processing speed, attention/concentration, executive functioning, safety/judgment, receptive and expressive language, and visuospatial abilities. Functionally, his wife is involved with medication and financial management. However, some of this may be for convenience and her direct knowledge of him performing these actions adequately. Given test scores, he best meets diagnostic criteria for a Mild Neurocognitive Disorder ("mild cognitive impairment") at the present time.  The etiology for ongoing dysfunction is unclear. Prior medical records suggest a diagnosis of "dementia likely due to Alzheimer's disease." Jeffery Turner certainly does not meet criteria for dementia given his current testing profile. Additionally, in my opinion, there is not enough evidence to currently state Alzheimer's disease as the most likely culprit with confidence. Despite some memory dysfunction, Jeffery Turner consistently performed well across memory recognition trials, thus not suggestive of a prominent storage impairment. He also performed very well across all non-memory areas commonly implicated in this illness. While I cannot rule out this illness, it should not be ruled in as he does  not display many of the patterns synonymous of this illness at the present time. Continued medical monitoring will be important moving forward to see if there is negative progression, especially given his family history and potential genetic predisposition.   Outside of concerns for Alzheimer's disease, both he and his wife reported that the onset of difficulties coincided with his CABG surgical procedure. A medical/vascular etiology remains plausible given this timeframe. Deficits in encoding/retrieval aspects of memory with intact recognition is a common finding in this regard. This should remain on his differential. He does not display behavioral characteristics of Lewy body disease, other more rare parkinsonian conditions, or frontotemporal lobar degeneration.   Recommendations: A repeat neuropsychological evaluation in 12-18 months (or sooner if functional decline is noted) is recommended to assess the trajectory of future cognitive decline should it occur. This will also aid in future efforts towards improved diagnostic clarity.  Jeffery Turner has already been prescribed two medications aimed to address memory loss and concerns surrounding Alzheimer's disease (i.e., donepezil/Aricept and memantine/Namenda). He is encouraged to continue taking this medication as prescribed. It is important to highlight that these medications have been shown to slow functional decline in some individuals. There is no current treatment which can stop or reverse cognitive decline when caused by a neurodegenerative illness.   Performance across neurocognitive testing is not a strong predictor of an individual's safety operating a motor vehicle. Should his family wish to pursue a formalized driving evaluation, they could reach out to the following agencies: [employer] in [place]: [phone] Driver Rehabilitative Services: [facility]: [place]: [phone] or  [phone]  Should there be a progression of current deficits over time, he is unlikely to regain any independent living skills lost. Therefore, it is recommended that he remain as involved as possible in all aspects of household chores, finances, and medication management, with supervision  to ensure adequate performance. He will likely benefit from the establishment and maintenance of a routine in order to maximize functional abilities over time.  It will be important for him to have another person with him when in situations where he may need to process information, weigh the pros and cons of different options, and make decisions, in order to ensure that he fully understands and recalls all information to be considered.  Jeffery Turner is encouraged to attend to lifestyle factors for brain health (e.g., regular physical exercise, good nutrition habits, regular participation in cognitively-stimulating activities, and general stress management techniques), which are likely to have benefits for both emotional adjustment and cognition. Optimal control of vascular risk factors (including safe cardiovascular exercise and adherence to dietary recommendations) is encouraged. Continued participation in activities which provide mental stimulation and social interaction is also recommended.   Memory can be improved using internal strategies such as rehearsal, repetition, chunking, mnemonics, association, and imagery. External strategies such as written notes in a consistently used memory journal, visual and nonverbal auditory cues such as a calendar on the refrigerator or appointments with alarm, such as on a cell phone, can also help maximize recall.    Because he shows better recall for structured information, he will likely understand and retain new information better if it is presented to him in a meaningful or well-organized manner at the outset, such as grouping items into meaningful categories or presenting  information in an outlined, bulleted, or story format.   To address problems with fluctuating attention, he may wish to consider:   -Avoiding external distractions when needing to concentrate   -Limiting exposure to fast paced environments with multiple sensory demands   -Writing down complicated information and using checklists   -Attempting and completing one task at a time (i.e., no multi-tasking)   -Verbalizing aloud each step of a task to maintain focus   -Taking frequent breaks during the completion of steps/tasks to avoid fatigue   -Reducing the amount of information considered at one time  Review of Records:   Jeffery Turner was seen by [facility] Sharene Butters, PA-C) on 03/31/2022 for an evaluation of memory loss. At that time, memory concerns were said to be present for the past two years, following a CABG procedure. He described generalized forgetfulness while his wife reported some trouble using tools and appliances. She also noted that he seems more repetitive in conversation and has greater trouble with directions while driving. Performance on a brief cognitive screening instrument (MOCA) was 25/30. Ultimately, Jeffery Turner was referred for a comprehensive neuropsychological evaluation to characterize his cognitive abilities and to assist with diagnostic clarity and treatment planning.   Brain MRI on 08/24/2021 revealed mild parenchymal volume loss, as well as mild chronic microvascular ischemic changes.   Past Medical History:  Diagnosis Date   Acoustic neuroma    Anemia    Angina pectoris 02/06/2018   Apnea 08/11/2021   Bilateral leg weakness 04/04/2021   Constant exophthalmos    right   Coronary artery disease of native artery of native heart with stable angina pectoris 07/21/2010   s/p stent to CFX 1997 2/2 MI s/p DES to [facility] and DES mLAD 07/14/10 2/2 [place] cath 07/14/10: dLAD 60%; CFX stent ok; OM1 30-40%; pRCA 30%; PDA 40%; EF 60%   Daytime somnolence 08/11/2021   Epistaxis  07/21/2010   Mixed hyperlipidemia 07/21/2010   Muscle cramp 01/18/2022   Old myocardial infarction 07/21/2010   Osteoarthritis 07/21/2010   PAD (peripheral  artery disease) 08/11/2021   PMR (polymyalgia rheumatica) 04/03/2021   Prediabetes 08/11/2021   S/P CABG x 3 02/05/2020   Secondary hypothyroidism 02/13/2021   Sensorineural hearing loss, bilateral    Shortness of breath 07/21/2010   Statin myopathy 04/04/2021   Thyroid disease    Transient visual loss, right    wears glasses   Ulnar neuropathy of both upper extremities 04/20/2022   Upper respiratory tract infection due to COVID-19 virus 05/14/2021    Past Surgical History:  Procedure Laterality Date   CARDIAC CATHETERIZATION     CORONARY ARTERY BYPASS GRAFT N/A 02/05/2020   Procedure: CORONARY ARTERY BYPASS GRAFTING (CABG), ON PUMP, TIMES THREE, USING LEFT INTERNAL MAMMARY ARTERY TO LEFT ANTERIOR DESCENDING ARTERY, REVERSE SAPHENOUS VEIN GRAFT TO POSTERIOR DESCENDING ARTERY AND CIRCUMFLEX ARTERY;  Surgeon: Grace Isaac, MD;  Location: [facility];  Service: Open Heart Surgery;  Laterality: N/A;   ENDOVEIN HARVEST OF GREATER SAPHENOUS VEIN Right 02/05/2020   Procedure: ENDOVEIN HARVEST OF GREATER SAPHENOUS VEIN;  Surgeon: Grace Isaac, MD;  Location: [place];  Service: Open Heart Surgery;  Laterality: Right;   LEFT HEART CATH AND CORONARY ANGIOGRAPHY N/A 01/25/2020   Procedure: LEFT HEART CATH AND CORONARY ANGIOGRAPHY;  Surgeon: Leonie Man, MD;  Location: [place] [facility];  Service: Cardiovascular;  Laterality: N/A;   TEE WITHOUT CARDIOVERSION N/A 02/05/2020   Procedure: TRANSESOPHAGEAL ECHOCARDIOGRAM (TEE);  Surgeon: Grace Isaac, MD;  Location: [place];  Service: Open Heart Surgery;  Laterality: N/A;   TONSILLECTOMY AND ADENOIDECTOMY     WISDOM TOOTH EXTRACTION     Current Outpatient Medications:    aspirin EC 81 MG tablet, Take 1 tablet (81 mg total) by mouth daily. Swallow whole., Disp: 90 tablet, Rfl: 3    donepezil (ARICEPT) 10 MG tablet, Take 1 tablet (10 mg total) by mouth at bedtime., Disp: 90 tablet, Rfl: 1   Evolocumab (REPATHA) 140 MG/ML SOSY, INJECT 140MG UNDER THE SKIN EVERY 2 WEEKS, Disp: 6 mL, Rfl: 1   levothyroxine (SYNTHROID) 25 MCG tablet, TAKE 1 TABLET EVERY DAY BEFORE BREAKFAST, Disp: 90 tablet, Rfl: 0   memantine (NAMENDA) 10 MG tablet, TAKE 1 TABLET TWICE DAILY, Disp: 180 tablet, Rfl: 1   metFORMIN (GLUCOPHAGE) 500 MG tablet, Take 1 tablet (500 mg total) by mouth daily with breakfast., Disp: 90 tablet, Rfl: 0   nitroGLYCERIN (NITROSTAT) 0.4 MG SL tablet, Place 1 tablet (0.4 mg total) under the tongue every 5 (five) minutes as needed. If require 2, recommend call 911., Disp: 50 tablet, Rfl: 0   pantoprazole (PROTONIX) 40 MG tablet, Take 40 mg by mouth daily as needed (chest pain)., Disp: , Rfl:   Clinical Interview:   The following information was obtained during a clinical interview with Jeffery Turner and his wife prior to cognitive testing.  Cognitive Symptoms: Decreased short-term memory: Endorsed. He described generalized concerns. With direct questioning, he acknowledged some trouble recalling details of recent conversations and noted that he might be slow to recall names and other information (which often comes later). His wife described greater memory dysfunction, including Jeffery Turner being repetitive in conversation and having trouble remembering turns and other aspects of navigation while driving.  Decreased long-term memory: Denied. Decreased attention/concentration: Denied. Reduced processing speed: Denied. Difficulties with executive functions: Endorsed. They reported a longstanding weakness surrounding organization and multi-tasking. This was said to be largely unchanged over time. They denied trouble with impulsivity. They also denied any significant personality changes outside of his wife noting  that he seems more irritable and has a shorter fuse when faced with memory lapses.   Difficulties with emotion regulation: Denied. Difficulties with receptive language: Denied. Difficulties with word finding: Denied. Decreased visuoperceptual ability: Denied.  Trajectory of deficits: Mr. Pallanes described memory dysfunction first being noticeable following his CABG procedure 2-2.5 years prior. He felt that difficulties have more or less remained stable and he denied his perception of progressive decline over time. His wife was in agreement that difficulties were first noticed around the time of this procedure.   Difficulties completing ADLs: Somewhat. His wife manages medications and finances/bill paying. When asked, this seemed to at least be partially due to convenience as his wife stated that she "just want[s] to make sure that he takes it" when referencing medications. He continues to drive locally. However, he has gotten turned around a time or two and called his wife for assistance.   Additional Medical History: History of traumatic brain injury/concussion: Denied. History of stroke: Denied. History of seizure activity: Denied. History of known exposure to toxins: Denied. Symptoms of chronic pain: Denied. However, he did later report ongoing pain in his feet and legs, generally experienced after he walks or tries to engage in some level of physical activity.  Experience of frequent headaches/migraines: Denied. Frequent instances of dizziness/vertigo: Denied.  Sensory changes: He wears reading glasses with benefit and is deaf in his right ear. Hearing aids were said to not always be helpful as they may magnify external, unintended sounds. He also reported a diminished sense of taste which has persisted since contracting COVID-19 in 2023.  Balance/coordination difficulties: Largely denied. He ambulates with a cane at times as a precaution as he will experience shortness of breath much more quickly since his CABG procedure. He denied any recent falls and one side of the body was  not said to be weaker or less stable than the other.  Other motor difficulties: Denied. He did report occasional hand cramping.   Sleep History: Estimated hours obtained each night: 7-8 hours.  Difficulties falling asleep: Denied. Difficulties staying asleep: He reported some nights where he might wake often to use the restroom.  Feels rested and refreshed upon awakening: Endorsed.  History of snoring: Endorsed. History of waking up gasping for air: Denied. Witnessed breath cessation while asleep: Denied.  History of vivid dreaming: Denied. Excessive movement while asleep: Denied. His wife did report some talking behaviors from time to time. However, she denied any physical manifestations.  Instances of acting out his dreams: Denied.  Psychiatric/Behavioral Health History: Depression: He described his current mood as positive overall and denied to his knowledge any prior mental health concerns or diagnoses. Current or remote suicidal ideation, intent, or plan was denied.  Anxiety: Denied. Mania: Denied. Trauma History: Denied. Visual/auditory hallucinations: Denied. Delusional thoughts: Denied.  Tobacco: Denied. Alcohol: He denied current alcohol consumption as well as a history of problematic alcohol abuse or dependence.  Recreational drugs: Denied.  Family History: Problem Relation Age of Onset   Heart failure Mother    Alzheimer's disease Mother    Dementia Mother    Dementia Sister    Alzheimer's disease Sister    Memory loss Brother    Coronary artery disease Other    This information was confirmed by Jeffery Turner.  Academic/Vocational History: Highest level of educational attainment: 14 years. He graduated from high school. While working and raising his family, he completed college courses while attending school at night. He ultimately earned enough credits to complete  his Sophomore year. He described himself as a good (A/B) student in academic settings. No relative  weaknesses were identified.  History of developmental delay: Denied. History of grade repetition: Denied. Enrollment in special education courses: Denied. History of LD/ADHD: Denied.  Employment: Retired. 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   Evaluation Results:   Behavioral Observations: Jeffery Turner was accompanied by his wife, arrived to his appointment on time, and was appropriately dressed and groomed. He appeared alert and oriented. He ambulated with a cane but did not exhibit any frank instability. Gross motor functioning appeared intact upon informal observation and no abnormal movements (e.g., tremors) were noted. His affect was generally relaxed and positive. Spontaneous speech was fluent and word finding difficulties were not observed during the clinical interview. Thought processes were coherent, organized, and normal in content. Insight into his cognitive difficulties appeared adequate.   During testing, hearing loss was apparent and seemed to mildly impact some verbal tasks. He reported purposefully not wearing his hearing aids to the current appointment as they enhance background noise which can be distracting. An amplification device (i.e., Pocket Talker) was introduced; however, he did not wish to use this device. Sustained attention was appropriate. Task engagement was adequate and he persisted when challenged. Overall, Jeffery Turner was cooperative with the clinical interview and subsequent testing procedures.   Adequacy of Effort: The validity of neuropsychological testing is limited by the extent to which the individual being tested may be assumed to have exerted adequate effort during testing. Jeffery Turner expressed his intention to perform to the best of his abilities and exhibited adequate task engagement and persistence. Scores across stand-alone and embedded performance validity measures were within expectation. As such, the results of the current  evaluation are believed to be a valid representation of Jeffery Turner current cognitive functioning.  Test Results: Jeffery Turner was fully oriented at the time of the current evaluation.  Intellectual abilities based upon educational and vocational attainment were estimated to be in the average range. Premorbid abilities were estimated to be within the average range based upon a single-word reading test.   Processing speed was average to above average. Basic attention was average. More complex attention (e.g., working memory) was also average. Executive functioning was average to well above average. He also performed in the average range across a task assessing safety and judgment.  Assessed receptive language abilities were above average. Likewise, Mr. Kotalik did not exhibit any difficulties comprehending task instructions and answered all questions asked of him appropriately. Assessed expressive language (e.g., verbal fluency and confrontation naming) was mildly variable but overall appropriate, ranging from the below average to well above average normative ranges.     Assessed visuospatial/visuoconstructional abilities were below average to average.    Learning (i.e., encoding) of novel information was well below average across a list learning task but average across a story-based task. Spontaneous delayed recall (i.e., retrieval) of previously learned information was exceptionally low to below average. Retention rates were 22% (raw score of 2) across a story learning task, 0% across a list learning task, and 31% across a figure drawing task. Performance across recognition tasks was below average to average, suggesting evidence for information consolidation.   Results of emotional screening instruments suggested that recent symptoms of generalized anxiety were in the minimal range, while symptoms of depression were within normal limits. A screening instrument assessing recent sleep quality suggested the  presence of minimal sleep dysfunction.  Tables of Scores:  Note: This summary of test scores accompanies the interpretive report and should not be considered in isolation without reference to the appropriate sections in the text. Descriptors are based on appropriate normative data and may be adjusted based on clinical judgment. Terms such as "Within Normal Limits" and "Outside Normal Limits" are used when a more specific description of the test score cannot be determined.       Percentile - Normative Descriptor > 98 - Exceptionally High 91-97 - Well Above Average 75-90 - Above Average 25-74 - Average 9-24 - Below Average 2-8 - Well Below Average < 2 - Exceptionally Low       Orientation:      Raw Score Percentile   NAB Orientation, Form 1 29/29 --- ---       Cognitive Screening:      Raw Score Percentile   SLUMS: 20/30 --- ---       RBANS, Form A: Standard Score/ Scaled Score Percentile   Total Score 84 14 Below Average  Immediate Memory 83 13 Below Average    List Learning 5 5 Well Below Average    Story Memory 9 37 Average  Visuospatial/Constructional 84 14 Below Average    Figure Copy 8 25 Average    Line Orientation 13/20 10-16 Below Average  Language 92 30 Average    Picture Naming 10/10 >75 Above Average    Semantic Fluency 6 9 Below Average  Attention 100 50 Average    Digit Span 10 50 Average    Coding 10 50 Average  Delayed Memory 85 16 Below Average    List Recall 0/10 <2 Exceptionally Low    List Recognition 19/20 26-50 Average    Story Recall 4 2 Well Below Average    Story Recognition 10/12 53-67 Average    Figure Recall 6 9 Below Average    Figure Recognition 3/8 6-20 Below Average        Intellectual Functioning:      Standard Score Percentile   Test of Premorbid Functioning: 97 42 Average       Attention/Executive Function:     Trail Making Test (TMT): Raw Score (Scaled Score) Percentile     Part A 39 secs.,  0 errors (12) 75 Above Average     Part B 115 secs.,  3 errors (10) 50 Average  *Based on [employer] Older Normative Studies (MOANS)           Scaled Score Percentile   WAIS-IV Digit Span: 10 50 Average    Forward 9 37 Average    Backward 10 50 Average    Sequencing 11 63 Average        Scaled Score Percentile   WAIS-IV Similarities: 14 91 Well Above Average       D-KEFS Color-Word Interference Test: Raw Score (Scaled Score) Percentile     Color Naming 34 secs. (11) 63 Average    Word Reading 29 secs. (9) 37 Average    Inhibition 65 secs. (13) 84 Above Average      Total Errors 0 errors (13) 84 Above Average    Inhibition/Switching 94 secs. (11) 63 Average      Total Errors 3 errors (11) 63 Average       NAB Executive Functions Module, Form 1: T Score Percentile     Judgment 49 46 Average       Language:     Verbal Fluency Test: Raw Score (Scaled Score) Percentile     Phonemic Fluency (CFL) 43 (  12) 75 Above Average    Category Fluency 42 (12) 75 Above Average  *Based on [employer] Older Normative Studies (MOANS)          NAB Language Module, Form 1: T Score Percentile     Auditory Comprehension 58 79 Above Average    Naming 31/31 (63) 91 Well Above Average       Visuospatial/Visuoconstruction:      Raw Score Percentile   Clock Drawing: 10/10 --- Within Normal Limits        Scaled Score Percentile   WAIS-IV Block Design: 11 63 Average       Mood and Personality:      Raw Score Percentile   Geriatric Depression Scale: 5 --- Within Normal Limits  Geriatric Anxiety Scale: 9 --- Minimal    Somatic 6 --- Mild    Cognitive 2 --- Minimal    Affective 1 --- Minimal       Additional Questionnaires:      Raw Score Percentile   PROMIS Sleep Disturbance Questionnaire: 9 --- None to Slight   Informed Consent and Coding/Compliance:   The current evaluation represents a clinical evaluation for the purposes previously outlined by the referral source and is in no way reflective of a forensic evaluation.   Jeffery Turner was  provided with a verbal description of the nature and purpose of the present neuropsychological evaluation. Also reviewed were the foreseeable risks and/or discomforts and benefits of the procedure, limits of confidentiality, and mandatory reporting requirements of this provider. The patient was given the opportunity to ask questions and receive answers about the evaluation. Oral consent to participate was provided by the patient.   This evaluation was conducted by Christia Reading, Ph.D., ABPP-CN, board certified clinical neuropsychologist. Jeffery Turner completed a clinical interview with [name], billed as one unit [phone], and 125 minutes of cognitive testing and scoring, billed as one unit [phone] and three additional units [id number]. Psychometrist Cruzita Lederer, B.S., assisted [name] with test administration and scoring procedures. As a separate and discrete service, [name] spent a total of 160 minutes in interpretation and report writing billed as one unit [phone] and two units [id number].

## 2022-08-03 ENCOUNTER — Ambulatory Visit: Payer: Medicare HMO | Admitting: Psychology

## 2022-08-03 DIAGNOSIS — G3184 Mild cognitive impairment, so stated: Secondary | ICD-10-CM

## 2022-08-03 DIAGNOSIS — Z951 Presence of aortocoronary bypass graft: Secondary | ICD-10-CM | POA: Diagnosis not present

## 2022-08-03 NOTE — Progress Notes (Signed)
   Neuropsychology Feedback Session Tillie Rung. [place] Department of Neurology  Reason for Referral:   Jeffery Turner is a 82 y.o. right-handed Caucasian male referred by Sharene Butters, PA-C, to characterize his current cognitive functioning and assist with diagnostic clarity and treatment planning in the context of subjective cognitive decline.   Feedback:   Jeffery Turner completed a comprehensive neuropsychological evaluation on 07/27/2022. Please refer to that encounter for the full report and recommendations. Briefly, results suggested performance variability but overall weakness surrounding delayed retrieval of novel information. Additional variability was exhibited encoding (i.e., learning) novel verbal information. The etiology for ongoing dysfunction is unclear. Prior medical records suggest a diagnosis of "dementia likely due to Alzheimer's disease." Jeffery Turner certainly does not meet criteria for dementia given his current testing profile. Additionally, in my opinion, there is not enough evidence to currently state Alzheimer's disease as the most likely culprit with confidence. Despite some memory dysfunction, Jeffery Turner consistently performed well across memory recognition trials, thus not suggestive of a prominent storage impairment. He also performed very well across all non-memory areas commonly implicated in this illness. While I cannot rule out this illness, it should not be ruled in as he does not display many of the patterns synonymous of this illness at the present time. Outside of concerns for Alzheimer's disease, both he and his wife reported that the onset of difficulties coincided with his CABG surgical procedure. A medical/vascular etiology remains plausible given this timeframe. Deficits in encoding/retrieval aspects of memory with intact recognition is a common finding in this regard. This should remain on his differential.   Jeffery Turner was accompanied by his wife during  the current feedback session. Content of the current session focused on the results of his neuropsychological evaluation. Jeffery Turner was given the opportunity to ask questions and his questions were answered. He was encouraged to reach out should additional questions arise. A copy of his report was provided at the conclusion of the visit.      25 minutes were spent conducting the current feedback session with Jeffery Turner, billed as one unit [phone].

## 2022-08-10 ENCOUNTER — Other Ambulatory Visit: Payer: Self-pay | Admitting: Family Medicine

## 2022-09-09 ENCOUNTER — Other Ambulatory Visit: Payer: Self-pay | Admitting: Family Medicine

## 2022-09-09 MED ORDER — PANTOPRAZOLE SODIUM 40 MG PO TBEC: 40.0000 mg | DELAYED_RELEASE_TABLET | Freq: Every day | ORAL | 1 refills | Status: DC | PRN

## 2022-09-13 ENCOUNTER — Other Ambulatory Visit: Payer: Self-pay

## 2022-09-13 ENCOUNTER — Other Ambulatory Visit: Payer: Self-pay | Admitting: Family Medicine

## 2022-09-13 MED ORDER — PANTOPRAZOLE SODIUM 40 MG PO TBEC: 40.0000 mg | DELAYED_RELEASE_TABLET | Freq: Every day | ORAL | 1 refills | Status: DC

## 2022-09-14 ENCOUNTER — Other Ambulatory Visit: Payer: Self-pay

## 2022-09-28 ENCOUNTER — Ambulatory Visit (INDEPENDENT_AMBULATORY_CARE_PROVIDER_SITE_OTHER): Payer: Medicare HMO | Admitting: Family Medicine

## 2022-09-28 VITALS — BP 105/60 | HR 56 | Temp 97.5°F | Ht 67.0 in | Wt 205.0 lb

## 2022-09-28 DIAGNOSIS — M79605 Pain in left leg: Secondary | ICD-10-CM

## 2022-09-28 DIAGNOSIS — M79604 Pain in right leg: Secondary | ICD-10-CM | POA: Diagnosis not present

## 2022-09-28 NOTE — Progress Notes (Unsigned)
Subjective:  Patient ID: Jeffery Turner, male    DOB: 1941/04/02  Age: 82 y.o. MRN: [id number]  Chief Complaint  Patient presents with   Pain    HPI Patient states at times when he walks it will have pain in his lower legs from his knees down to his feet. Feels like he is not as strong as he use to be. When walk mowing he has to stop 4-5 times vs 2 times. States his yard is not very large at all, is aware age has something to do with his symptoms but feels maybe his medications are playing a role in his symptoms. He used to walk 1.7 miles a day. ABIs done and were normal.      09/28/2022    3:28 PM 06/22/2022   10:17 AM 03/16/2022    3:31 PM 01/13/2022    9:04 AM 08/11/2021    1:36 PM  Depression screen PHQ 2/9  Decreased Interest 0 0 0 0 0  Down, Depressed, Hopeless 0 0 0 0 0  PHQ - 2 Score 0 0 0 0 0         01/13/2022    9:04 AM 03/16/2022    3:31 PM 03/31/2022    1:15 PM 06/22/2022   10:17 AM 09/28/2022    3:28 PM  Fall Risk  Falls in the past year? 0 0 0 0 0  Was there an injury with Fall? 1 0 0 0 0  Fall Risk Category Calculator 1 0 0 0 0  Fall Risk Category (Retired) Low Low Low Low   (RETIRED) Patient Fall Risk Level  Low fall risk High fall risk Low fall risk   Patient at Risk for Falls Due to  No Fall Risks  No Fall Risks Impaired balance/gait  Fall risk Follow up Falls evaluation completed;Falls prevention discussed Falls evaluation completed Falls evaluation completed Falls evaluation completed Falls evaluation completed      Review of Systems  Constitutional:  Negative for fatigue.  Respiratory:  Positive for shortness of breath. Negative for cough.   Cardiovascular:  Negative for chest pain.  Musculoskeletal:  Negative for back pain.    Current Outpatient Medications on File Prior to Visit  Medication Sig Dispense Refill   aspirin EC 81 MG tablet Take 1 tablet (81 mg total) by mouth daily. Swallow whole. 90 tablet 3   donepezil (ARICEPT) 10 MG tablet Take 1  tablet (10 mg total) by mouth at bedtime. 90 tablet 1   Evolocumab (REPATHA) 140 MG/ML SOSY INJECT  UNDER THE SKIN EVERY 2 WEEKS 6 mL 1   levothyroxine (SYNTHROID) 25 MCG tablet TAKE 1 TABLET EVERY DAY BEFORE BREAKFAST 90 tablet 3   memantine (NAMENDA) 10 MG tablet TAKE 1 TABLET TWICE DAILY 180 tablet 1   metFORMIN (GLUCOPHAGE) 500 MG tablet TAKE 1 TABLET EVERY DAY WITH BREAKFAST 90 tablet 3   nitroGLYCERIN (NITROSTAT) 0.4 MG SL tablet Place 1 tablet (0.4 mg total) under the tongue every 5 (five) minutes as needed. If require 2, recommend call 911. 50 tablet 0   pantoprazole (PROTONIX) 40 MG tablet Take 1 tablet (40 mg total) by mouth daily. 90 tablet 1   No current facility-administered medications on file prior to visit.   Past Medical History:  Diagnosis Date   Acoustic neuroma    Anemia    Angina pectoris 02/06/2018   Apnea 08/11/2021   Bilateral leg weakness 04/04/2021   Constant exophthalmos    right   Coronary  artery disease of native artery of native heart with stable angina pectoris 07/21/2010   s/p stent to CFX 1997 2/2 MI s/p DES to [facility] and DES mLAD 07/14/10 2/2 [place] cath 07/14/10: dLAD 60%; CFX stent ok; OM1 30-40%; pRCA 30%; PDA 40%; EF 60%   Daytime somnolence 08/11/2021   Epistaxis 07/21/2010   Mild cognitive impairment of uncertain or unknown etiology 07/27/2022   Mixed hyperlipidemia 07/21/2010   Muscle cramp 01/18/2022   Old myocardial infarction 07/21/2010   Osteoarthritis 07/21/2010   PAD (peripheral artery disease) 08/11/2021   PMR (polymyalgia rheumatica) 04/03/2021   Prediabetes 08/11/2021   S/P CABG x 3 02/05/2020   Secondary hypothyroidism 02/13/2021   Sensorineural hearing loss, bilateral    Shortness of breath 07/21/2010   Statin myopathy 04/04/2021   Thyroid disease    Transient visual loss, right    wears glasses   Ulnar neuropathy of both upper extremities 04/20/2022   Upper respiratory tract infection due to COVID-19 virus 05/14/2021   Past  Surgical History:  Procedure Laterality Date   CARDIAC CATHETERIZATION     CORONARY ARTERY BYPASS GRAFT N/A 02/05/2020   Procedure: CORONARY ARTERY BYPASS GRAFTING (CABG), ON PUMP, TIMES THREE, USING LEFT INTERNAL MAMMARY ARTERY TO LEFT ANTERIOR DESCENDING ARTERY, REVERSE SAPHENOUS VEIN GRAFT TO POSTERIOR DESCENDING ARTERY AND CIRCUMFLEX ARTERY;  Surgeon: Delight Ovens, MD;  Location: [facility] OR;  Service: Open Heart Surgery;  Laterality: N/A;   ENDOVEIN HARVEST OF GREATER SAPHENOUS VEIN Right 02/05/2020   Procedure: ENDOVEIN HARVEST OF GREATER SAPHENOUS VEIN;  Surgeon: Delight Ovens, MD;  Location[facility];  Service: Open Heart Surgery;  Laterality: Right;   LEFT HEART CATH AND CORONARY ANGIOGRAPHY N/A 01/25/2020   Procedure: LEFT HEART CATH AND CORONARY ANGIOGRAPHY;  Surgeon: Marykay Lex, MD;  Location: [facility] INVASIVE CV LAB;  Service: Cardiovascular;  Laterality: N/A;   TEE WITHOUT CARDIOVERSION N/A 02/05/2020   Procedure: TRANSESOPHAGEAL ECHOCARDIOGRAM (TEE);  Surgeon: Delight Ovens, MD;  Location: [facility] OR;  Service: Open Heart Surgery;  Laterality: N/A;   TONSILLECTOMY AND ADENOIDECTOMY     WISDOM TOOTH EXTRACTION      Family History  Problem Relation Age of Onset   Heart failure Mother    Alzheimer's disease Mother    Dementia Mother    Dementia Sister    Alzheimer's disease Sister    Memory loss Brother    Coronary artery disease Other    Social History   Socioeconomic History   Marital status: Married    Spouse name: Not on file   Number of children: 2   Years of education: 14   Highest education level: Some college, no degree  Occupational History   Occupation: Retired    Comment: Holiday representative  Tobacco Use   Smoking status: Former    Packs/day: 1.50    Years: 37.00    Additional pack years: 0.00    Total pack years: 55.50    Types: Cigarettes    Quit date: 05/2000    Years since quitting: 22.4   Smokeless tobacco: Never  Vaping Use   Vaping Use: Never used   Substance and Sexual Activity   Alcohol use: No   Drug use: No   Sexual activity: Not Currently  Other Topics Concern   Not on file  Social History Narrative   He lives in [place] with family.  He is a retired  Museum/gallery exhibitions officer.  He quit tobacco in 1997 with approximately 30-pack-  year history and denies alcohol  or drug abuse.   Right handed   Drinks caffeine   Two story home      Social Determinants of Health   Financial Resource Strain: Not on file  Food Insecurity: Not on file  Transportation Needs: Not on file  Physical Activity: Sufficiently Active (05/18/2020)   Exercise Vital Sign    Days of Exercise per Week: 5 days    Minutes of Exercise per Session: 30 min  Stress: Stress Concern Present (05/18/2020)   [employer] of Occupational Health - Occupational Stress Questionnaire    Feeling of Stress : To some extent  Social Connections: Unknown (05/18/2020)   Social Connection and Isolation Panel [NHANES]    Frequency of Communication with Friends and Family: Not on file    Frequency of Social Gatherings with Friends and Family: Not on file    Attends Religious Services: Not on file    Active Member of Clubs or Organizations: Not on file    Attends Banker Meetings: Not on file    Marital Status: Married    Objective:  Pulse (!) 56   Temp (!) 97.5 F (36.4 C)   Ht  (1.702 m)   Wt 205 lb (93 kg)   SpO2 98%   BMI 32.11 kg/m      09/28/2022    3:22 PM 07/22/2022   12:58 PM 07/22/2022   12:56 PM  BP/Weight  Systolic BP  105 562  Diastolic BP  61 58  Wt. (Lbs) 205  212  BMI 32.11 kg/m2  33.2 kg/m2    Physical Exam Vitals reviewed.  Constitutional:      Appearance: Normal appearance. He is obese.  Cardiovascular:     Rate and Rhythm: Normal rate and regular rhythm.     Heart sounds: No murmur heard. Pulmonary:     Effort: Pulmonary effort is normal.     Breath sounds: Normal breath sounds.  Abdominal:     General: Abdomen is flat.  Bowel sounds are normal.     Palpations: Abdomen is soft.     Tenderness: There is no abdominal tenderness.  Musculoskeletal:        General: No swelling. Tenderness: knees.Normal range of motion.  Neurological:     Mental Status: He is alert and oriented to person, place, and time.  Psychiatric:        Mood and Affect: Mood normal.        Behavior: Behavior normal.    Diabetic Foot Exam - Simple   Simple Foot Form Diabetic Foot exam was performed with the following findings: Yes 09/28/2022  3:33 PM  Visual Inspection No deformities, no ulcerations, no other skin breakdown bilaterally: Yes Sensation Testing Intact to touch and monofilament testing bilaterally: Yes Pulse Check Posterior Tibialis and Dorsalis pulse intact bilaterally: Yes Comments      Lab Results  Component Value Date   WBC 5.2 06/16/2022   HGB 13.0 06/16/2022   HCT 38.8 06/16/2022   PLT 277 06/16/2022   GLUCOSE 92 06/16/2022   CHOL 102 06/16/2022   TRIG 143 06/16/2022   HDL 31 (L) 06/16/2022   LDLDIRECT 152.5 10/05/2010   LDLCALC 46 06/16/2022   ALT 13 06/16/2022   AST 12 06/16/2022   NA 134 06/16/2022   K 4.4 06/16/2022   CL 97 06/16/2022   CREATININE 1.03 06/16/2022   BUN 19 06/16/2022   CO2 23 06/16/2022   TSH 1.230 03/15/2022   INR 1.4 (H) 02/05/2020   HGBA1C 6.3 (  H) 06/16/2022      Assessment & Plan:    Bilateral leg pain Assessment & Plan: Referring to Orthopedic surgery. Concerned about possible spinal stenosis.   Orders: -     Ambulatory referral to Orthopedic Surgery     No orders of the defined types were placed in this encounter.   Orders Placed This Encounter  Procedures   Ambulatory referral to Orthopedic Surgery     Follow-up: No follow-ups on file.   I,Katherina A Bramblett,acting as a scribe for Blane Ohara, MD.,have documented all relevant documentation on the behalf of Blane Ohara, MD,as directed by  Blane Ohara, MD while in the presence of Blane Ohara,  MD.   Clayborn Bigness I Leal-Borjas,acting as a scribe for Blane Ohara, MD.,have documented all relevant documentation on the behalf of Blane Ohara, MD,as directed by  Blane Ohara, MD while in the presence of Blane Ohara, MD.    An After Visit Summary was printed and given to the patient.  Blane Ohara, MD [facility] [phone]

## 2022-10-01 DIAGNOSIS — M79605 Pain in left leg: Secondary | ICD-10-CM | POA: Insufficient documentation

## 2022-10-01 DIAGNOSIS — M79604 Pain in right leg: Secondary | ICD-10-CM | POA: Insufficient documentation

## 2022-10-01 NOTE — Assessment & Plan Note (Signed)
Referring to Orthopedic surgery. Concerned about possible spinal stenosis.

## 2022-10-03 ENCOUNTER — Encounter: Payer: Self-pay | Admitting: Family Medicine

## 2022-10-06 ENCOUNTER — Ambulatory Visit: Payer: Medicare HMO | Admitting: Physician Assistant

## 2022-10-06 ENCOUNTER — Encounter: Payer: Self-pay | Admitting: Physician Assistant

## 2022-10-06 VITALS — BP 113/71 | HR 74 | Resp 18 | Ht 67.0 in | Wt 208.0 lb

## 2022-10-06 DIAGNOSIS — G3184 Mild cognitive impairment, so stated: Secondary | ICD-10-CM

## 2022-10-06 NOTE — Patient Instructions (Addendum)
It was a pleasure to see you today at our office.    Recommendations:  Follow up in  6 months Hold donepezil  Continue Memantine 10 mg twice daily.Side effects were discussed  Consider resume Physical therapy . Increase activity  Check the hearing   Whom to call:  Memory  decline, memory medications: Call our office [phone]   For psychiatric meds, mood meds: Please have your primary care physician manage these medications.   Counseling regarding caregiver distress, including caregiver depression, anxiety and issues regarding community resources, adult day care programs, adult living facilities, or memory care questions:   Feel free to contact Misty Lisabeth Register, Social Worker at [phone]   For assessment of decision of mental capacity and competency:  Call Dr. Erick Blinks, geriatric psychiatrist at [phone]  For guidance in geriatric dementia issues please call [facility] [phone]  For guidance regarding [facility] and if placement were needed at the facility, contact Sidney Ace, Social Worker tel: [phone]  If you have any severe symptoms of a stroke, or other severe issues such as confusion,severe chills or fever, etc call 911 or go to the ER as you may need to be evaluated further      RECOMMENDATIONS FOR ALL PATIENTS WITH MEMORY PROBLEMS: 1. Continue to exercise (Recommend 30 minutes of walking everyday, or 3 hours every week) 2. Increase social interactions - continue going to [place] and enjoy social gatherings with friends and family 3. Eat healthy, avoid fried foods and eat more fruits and vegetables 4. Maintain adequate blood pressure, blood sugar, and blood cholesterol level. Reducing the risk of stroke and cardiovascular disease also helps promoting better memory. 5. Avoid stressful situations. Live a simple life and avoid aggravations. Organize your time and prepare for the next day in anticipation. 6.  Sleep well, avoid any interruptions of sleep and avoid any distractions in the bedroom that may interfere with adequate sleep quality 7. Avoid sugar, avoid sweets as there is a strong link between excessive sugar intake, diabetes, and cognitive impairment We discussed the Mediterranean diet, which has been shown to help patients reduce the risk of progressive memory disorders and reduces cardiovascular risk. This includes eating fish, eat fruits and green leafy vegetables, nuts like almonds and hazelnuts, walnuts, and also use olive oil. Avoid fast foods and fried foods as much as possible. Avoid sweets and sugar as sugar use has been linked to worsening of memory function.  There is always a concern of gradual progression of memory problems. If this is the case, then we may need to adjust level of care according to patient needs. Support, both to the patient and caregiver, should then be put into place.    The [employer] is here all day, every day for people facing Alzheimer's disease through our free 24/7 Helpline: [phone]. The Helpline provides reliable information and support to all those who need assistance, such as individuals living with memory loss, Alzheimer's or other dementia, caregivers, health care professionals and the public.  Our highly trained and knowledgeable staff can help you with: Understanding memory loss, dementia and Alzheimer's  Medications and other treatment options  General information about aging and brain health  Skills to provide quality care and to find the best care from professionals  Legal, financial and living-arrangement decisions Our Helpline also features: Confidential care consultation provided by master's level clinicians who can help with decision-making support, crisis assistance and education on issues families face every day  Help  in a caller's preferred language using our translation service that features more than 200 languages and  dialects  Referrals to local community programs, services and ongoing support     FALL PRECAUTIONS: Be cautious when walking. Scan the area for obstacles that may increase the risk of trips and falls. When getting up in the mornings, sit up at the edge of the bed for a few minutes before getting out of bed. Consider elevating the bed at the head end to avoid drop of blood pressure when getting up. Walk always in a well-lit room (use night lights in the walls). Avoid area rugs or power cords from appliances in the middle of the walkways. Use a walker or a cane if necessary and consider physical therapy for balance exercise. Get your eyesight checked regularly.  FINANCIAL OVERSIGHT: Supervision, especially oversight when making financial decisions or transactions is also recommended.  HOME SAFETY: Consider the safety of the kitchen when operating appliances like stoves, microwave oven, and blender. Consider having supervision and share cooking responsibilities until no longer able to participate in those. Accidents with firearms and other hazards in the house should be identified and addressed as well.   ABILITY TO BE LEFT ALONE: If patient is unable to contact 911 operator, consider using LifeLine, or when the need is there, arrange for someone to stay with patients. Smoking is a fire hazard, consider supervision or cessation. Risk of wandering should be assessed by caregiver and if detected at any point, supervision and safe proof recommendations should be instituted.  MEDICATION SUPERVISION: Inability to self-administer medication needs to be constantly addressed. Implement a mechanism to ensure safe administration of the medications.   DRIVING: Regarding driving, in patients with progressive memory problems, driving will be impaired. We advise to have someone else do the driving if trouble finding directions or if minor accidents are reported. Independent driving assessment is available to  determine safety of driving.   If you are interested in the driving assessment, you can contact the following:  [facility] [employer] [facility] in [place] [phone]  [facility] [phone]  [facility] [phone] or [phone]      Mediterranean Diet A Mediterranean diet refers to food and lifestyle choices that are based on the traditions of countries located on the [employer]. This way of eating has been shown to help prevent certain conditions and improve outcomes for people who have chronic diseases, like kidney disease and heart disease. What are tips for following this plan? Lifestyle  Cook and eat meals together with your family, when possible. Drink enough fluid to keep your urine clear or pale yellow. Be physically active every day. This includes: Aerobic exercise like running or swimming. Leisure activities like gardening, walking, or housework. Get 7-8 hours of sleep each night. If recommended by your health care provider, drink red wine in moderation. This means 1 glass a day for nonpregnant women and 2 glasses a day for men. A glass of wine equals 5 oz (150 mL). Reading food labels  Check the serving size of packaged foods. For foods such as rice and pasta, the serving size refers to the amount of cooked product, not dry. Check the total fat in packaged foods. Avoid foods that have saturated fat or trans fats. Check the ingredients list for added sugars, such as corn syrup. Shopping  At the grocery store, buy most of your food from the areas near the walls of the store. This includes: Fresh  fruits and vegetables (produce). Grains, beans, nuts, and seeds. Some of these may be available in unpackaged forms or large amounts (in bulk). Fresh seafood. Poultry and eggs. Low-fat dairy products. Buy whole ingredients instead of prepackaged foods. Buy fresh fruits and vegetables in-season from local  farmers markets. Buy frozen fruits and vegetables in resealable bags. If you do not have access to quality fresh seafood, buy precooked frozen shrimp or canned fish, such as tuna, salmon, or sardines. Buy small amounts of raw or cooked vegetables, salads, or olives from the deli or salad bar at your store. Stock your pantry so you always have certain foods on hand, such as olive oil, canned tuna, canned tomatoes, rice, pasta, and beans. Cooking  Cook foods with extra-virgin olive oil instead of using butter or other vegetable oils. Have meat as a side dish, and have vegetables or grains as your main dish. This means having meat in small portions or adding small amounts of meat to foods like pasta or stew. Use beans or vegetables instead of meat in common dishes like chili or lasagna. Experiment with different cooking methods. Try roasting or broiling vegetables instead of steaming or sauteing them. Add frozen vegetables to soups, stews, pasta, or rice. Add nuts or seeds for added healthy fat at each meal. You can add these to yogurt, salads, or vegetable dishes. Marinate fish or vegetables using olive oil, lemon juice, garlic, and fresh herbs. Meal planning  Plan to eat 1 vegetarian meal one day each week. Try to work up to 2 vegetarian meals, if possible. Eat seafood 2 or more times a week. Have healthy snacks readily available, such as: Vegetable sticks with hummus. Greek yogurt. Fruit and nut trail mix. Eat balanced meals throughout the week. This includes: Fruit: 2-3 servings a day Vegetables: 4-5 servings a day Low-fat dairy: 2 servings a day Fish, poultry, or lean meat: 1 serving a day Beans and legumes: 2 or more servings a week Nuts and seeds: 1-2 servings a day Whole grains: 6-8 servings a day Extra-virgin olive oil: 3-4 servings a day Limit red meat and sweets to only a few servings a month What are my food choices? Mediterranean diet Recommended Grains: Whole-grain pasta.  Brown rice. Bulgar wheat. Polenta. Couscous. Whole-wheat bread. Orpah Cobb. Vegetables: Artichokes. Beets. Broccoli. Cabbage. Carrots. Eggplant. Green beans. Chard. Kale. Spinach. Onions. Leeks. Peas. Squash. Tomatoes. Peppers. Radishes. Fruits: Apples. Apricots. Avocado. Berries. Bananas. Cherries. Dates. Figs. Grapes. Lemons. Melon. Oranges. Peaches. Plums. Pomegranate. Meats and other protein foods: Beans. Almonds. Sunflower seeds. Pine nuts. Peanuts. Cod. Salmon. Scallops. Shrimp. Tuna. Tilapia. Clams. Oysters. Eggs. Dairy: Low-fat milk. Cheese. Greek yogurt. Beverages: Water. Red wine. Herbal tea. Fats and oils: Extra virgin olive oil. Avocado oil. Grape seed oil. Sweets and desserts: [place] yogurt with honey. Baked apples. Poached pears. Trail mix. Seasoning and other foods: Basil. Cilantro. Coriander. Cumin. Mint. Parsley. Sage. Rosemary. Tarragon. Garlic. Oregano. Thyme. Pepper. Balsalmic vinegar. Tahini. Hummus. Tomato sauce. Olives. Mushrooms. Limit these Grains: Prepackaged pasta or rice dishes. Prepackaged cereal with added sugar. Vegetables: Deep fried potatoes (french fries). Fruits: Fruit canned in syrup. Meats and other protein foods: Beef. Pork. Lamb. Poultry with skin. Hot dogs. Tomasa Blase. Dairy: Ice cream. Sour cream. Whole milk. Beverages: Juice. Sugar-sweetened soft drinks. Beer. Liquor and spirits. Fats and oils: Butter. Canola oil. Vegetable oil. Beef fat (tallow). Lard. Sweets and desserts: Cookies. Cakes. Pies. Candy. Seasoning and other foods: Mayonnaise. Premade sauces and marinades. The items listed may not be a complete list.  Talk with your dietitian about what dietary choices are right for you. Summary The Mediterranean diet includes both food and lifestyle choices. Eat a variety of fresh fruits and vegetables, beans, nuts, seeds, and whole grains. Limit the amount of red meat and sweets that you eat. Talk with your health care provider about whether it is safe  for you to drink red wine in moderation. This means 1 glass a day for nonpregnant women and 2 glasses a day for men. A glass of wine equals 5 oz (150 mL). This information is not intended to replace advice given to you by your health care provider. Make sure you discuss any questions you have with your health care provider. Document Released: 01/22/2016 Document Revised: 02/24/2016 Document Reviewed: 01/22/2016 Elsevier Interactive Patient Education  2017 [employer].

## 2022-10-06 NOTE — Progress Notes (Signed)
Assessment/Plan:    Mild cognitive impairment of unclear etiology  Jeffery Turner is a very pleasant 82 y.o. RH male with a history of sensorineural hearing loss, CAD status post CABG, PMR, prediabetes, hyperlipidemia, OSA, PAF, hypothyroidism presenting today in follow-up for evaluation of memory loss.  Since his last visit, the patient had a neuropsychological evaluation, yielding a diagnosis of mild cognitive impairment, however uncertain or unclear etiology, but unlikely due to Alzheimer's disease.  There is a vascular component that needs to continue to be monitored.  Prior MRI of the brain March 2023, personally reviewed was remarkable for mild chronic microvascular ischemic changes and mild parenchymal volume loss.  Patient is on memantine 10 mg twice daily and donepezil 10 mg nightly by PCP.  The patient has been having some episodes of loose stools, and in view of the above diagnosis, it is felt prudent to discontinue donepezil since as a side effect, may provoke some loose stools as well.  Patient agrees with the plan.  She remains independent with his ADLs.  He only drives very short distance to the dumpster and comes back.  He no longer drives any other distances, his wife deficits.   Recommendations:   Follow up in   months. Repeat neuropsychological testing in 12 to 18 months for clarity of the diagnosis and disease trajectory Hold donepezil 10 mg daily in view of loose stools Continue memantine 10 mg twice daily as per PCP Recommend evaluation for hearing loss Recommend good control of cardiovascular risk factors Continue to control mood as per PCP Monitor driving.    Subjective:   This patient is accompanied in the office by his wife who supplements the history. Previous records as well as any outside records available were reviewed prior to todays visit.   Patient was last seen on 04/07/2022, at which time increase MoCA was 25/30     Any changes in memory since last  visit?  He has some forgetfulness with conversations and short-term memory in general, long-term memory is good.  At times, he has some difficulty using tools and appliances such as the remote control of the computer.  He enjoys doing crossword puzzles and word finding. repeats oneself?  Endorsed Disoriented when walking into a room?  Patient denies  Leaving objects in unusual places? Sometimes he misplaces things but nothing unusual areas  Wandering behavior?   denies   Any personality changes since last visit?   denies   Any worsening depression?: denies   Hallucinations or paranoia?  denies   Seizures?   denies    Any sleep changes?  Occasionally has "real dreams" ("always been like that ")  Denies REM behavior or sleepwalking   Sleep apnea?   denies   Any hygiene concerns?   denies   Independent of bathing and dressing?  Endorsed  Does the patient needs help with medications? Wife  is in charge  Who is in charge of the finances? Wife  is in charge  (always done it")   Any changes in appetite? Taste buds changed since heart surgery    Patient have trouble swallowing?  denies   Does the patient cook? no   Any headaches?  denies   Vision changes? denies Chronic back pain  denies   Ambulates with difficulty?  He needs a R cane, because he becomes short of breath when ambulating. Recent falls or head injuries?    denies     Unilateral weakness, numbness or tingling? denies  Any tremors?  denies   Any anosmia?   Mild, chronic. Any incontinence of urine?  denies   Any bowel dysfunction? Intermittent lose stools   Patient lives with his wife Does the patient drive?  He continues to drive, at times he becomes lost, so he drives only to the dumpster.  Initial visit 03/31/2022   How long did patient have memory difficulties? About 2 years ago after CABG he noticed more forgetfulness with conversations and short term memory in general . Long term memory is good. He may not pay much  attention to what it is said to him.  His wife reports he also has difficulty using tools and appliances such as the remote control or the computer . He and enjoys doing crossword puzzles, word finding.  repeats oneself? Endorsed , especially with appointment Disoriented when walking into a room?  Patient denies   Leaving objects in unusual places?  Patient denies   Patient lives with his wife  Ambulates  with difficulty?   Patient denies, but he gets short of breath so he uses wheelchair or cane  Recent falls?  Patient denies   Any head injuries?  Patient denies   History of seizures?   Patient denies   Wandering behavior?  Patient denies   Patient drives?  Over the lat 6 months he gets lost at times, or does not remember where to go Any mood changes ? "Occasionally when he may not understand something or cannot hear well " Any depression?:  Patient denies   Hallucinations?  Patient denies   Paranoia?  Patient denies   Patient reports that sleeps well without vivid dreams, REM behavior or sleepwalking    History of sleep apnea?  Patient denies   Any hygiene concerns?  Patient denies   Independent of bathing and dressing?  Endorsed  Does the patient needs help with medications? Wife  in charge  Who is in charge of the finances?   Wife is in charge   Any changes in appetite?  Patient denies , but not as much as before  Patient have trouble swallowing? Patient denies   Does the patient cook?  Patient denies   Any headaches?  Patient denies   Double vision? Patient denies   Any focal numbness or tingling?  Patient denies   Chronic back pain Patient denies   Unilateral weakness?  Patient denies   Any tremors?  Patient denies   Any history of anosmia?" Not as well as before, for the last year " Any incontinence of urine?  Patient denies   Any bowel dysfunction?   Patient denies   History of heavy alcohol intake?  Patient denies   History of heavy tobacco use?  Patient denies  quit x 22  years  Family history of dementia?  Strong family history in  Mother and aunts uncles and brothers (Alzheimer's disease)   Pertinent labs 03/2022 Hb 12.8, TG 174, LDL 138, A1C 6.0, TSH 1.2  Neuropsychological testing 08/03/2022 b many of the patterns synonymous of this illness at the present time. Outside of concerns for Alzheimer's disease, both he and his wife reported thariefly, results suggested performance variability but overall weakness surrounding delayed retrieval of novel information. Additional variability was exhibited encoding (i.e., learning) novel verbal information. The etiology for ongoing dysfunction is unclear. Prior medical records suggest a diagnosis of "dementia likely due to Alzheimer's disease." Mr. Swamy certainly does not meet criteria for dementia given his current testing profile. Additionally, in my opinion,  there is not enough evidence to currently state Alzheimer's disease as the most likely culprit with confidence. Despite some memory dysfunction, Mr. Bifulco consistently performed well across memory recognition trials, thus not suggestive of a prominent storage impairment. He also performed very well across all non-memory areas commonly implicated in this illness. While I cannot rule out this illness, it should not be ruled in as he does not display the onset of difficulties coincided with his CABG surgical procedure. A medical/vascular etiology remains plausible given this timeframe. Deficits in encoding/retrieval aspects of memory with intact recognition is a common finding in this regard. This should remain on his differential.     Past Medical History:  Diagnosis Date   Acoustic neuroma    Anemia    Angina pectoris 02/06/2018   Apnea 08/11/2021   Bilateral leg weakness 04/04/2021   Constant exophthalmos    right   Coronary artery disease of native artery of native heart with stable angina pectoris 07/21/2010   s/p stent to CFX 1997 2/2 MI s/p DES to mRCA and DES mLAD  07/14/10 2/2 [place] cath 07/14/10: dLAD 60%; CFX stent ok; OM1 30-40%; pRCA 30%; PDA 40%; EF 60%   Daytime somnolence 08/11/2021   Epistaxis 07/21/2010   Mild cognitive impairment of uncertain or unknown etiology 07/27/2022   Mixed hyperlipidemia 07/21/2010   Muscle cramp 01/18/2022   Old myocardial infarction 07/21/2010   Osteoarthritis 07/21/2010   PAD (peripheral artery disease) 08/11/2021   PMR (polymyalgia rheumatica) 04/03/2021   Prediabetes 08/11/2021   S/P CABG x 3 02/05/2020   Secondary hypothyroidism 02/13/2021   Sensorineural hearing loss, bilateral    Shortness of breath 07/21/2010   Statin myopathy 04/04/2021   Thyroid disease    Transient visual loss, right    wears glasses   Ulnar neuropathy of both upper extremities 04/20/2022   Upper respiratory tract infection due to COVID-19 virus 05/14/2021     Past Surgical History:  Procedure Laterality Date   CARDIAC CATHETERIZATION     CORONARY ARTERY BYPASS GRAFT N/A 02/05/2020   Procedure: CORONARY ARTERY BYPASS GRAFTING (CABG), ON PUMP, TIMES THREE, USING LEFT INTERNAL MAMMARY ARTERY TO LEFT ANTERIOR DESCENDING ARTERY, REVERSE SAPHENOUS VEIN GRAFT TO POSTERIOR DESCENDING ARTERY AND CIRCUMFLEX ARTERY;  Surgeon: Delight Ovens, MD;  Location: [facility] OR;  Service: Open Heart Surgery;  Laterality: N/A;   ENDOVEIN HARVEST OF GREATER SAPHENOUS VEIN Right 02/05/2020   Procedure: ENDOVEIN HARVEST OF GREATER SAPHENOUS VEIN;  Surgeon: Delight Ovens, MD;  Location: [facility] OR;  Service: Open Heart Surgery;  Laterality: Right;   LEFT HEART CATH AND CORONARY ANGIOGRAPHY N/A 01/25/2020   Procedure: LEFT HEART CATH AND CORONARY ANGIOGRAPHY;  Surgeon: Marykay Lex, MD;  Location: [facility] INVASIVE CV LAB;  Service: Cardiovascular;  Laterality: N/A;   TEE WITHOUT CARDIOVERSION N/A 02/05/2020   Procedure: TRANSESOPHAGEAL ECHOCARDIOGRAM (TEE);  Surgeon: Delight Ovens, MD;  Location: [facility] OR;  Service: Open Heart Surgery;  Laterality: N/A;    TONSILLECTOMY AND ADENOIDECTOMY     WISDOM TOOTH EXTRACTION       PREVIOUS MEDICATIONS:   CURRENT MEDICATIONS:  Outpatient Encounter Medications as of 10/06/2022  Medication Sig   aspirin EC 81 MG tablet Take 1 tablet (81 mg total) by mouth daily. Swallow whole.   donepezil (ARICEPT) 10 MG tablet Take 1 tablet (10 mg total) by mouth at bedtime.   Evolocumab (REPATHA) 140 MG/ML SOSY INJECT 140MG  UNDER THE SKIN EVERY 2 WEEKS   levothyroxine (SYNTHROID) 25 MCG tablet TAKE  1 TABLET EVERY DAY BEFORE BREAKFAST   memantine (NAMENDA) 10 MG tablet TAKE 1 TABLET TWICE DAILY   metFORMIN (GLUCOPHAGE) 500 MG tablet TAKE 1 TABLET EVERY DAY WITH BREAKFAST   nitroGLYCERIN (NITROSTAT) 0.4 MG SL tablet Place 1 tablet (0.4 mg total) under the tongue every 5 (five) minutes as needed. If require 2, recommend call 911.   pantoprazole (PROTONIX) 40 MG tablet Take 1 tablet (40 mg total) by mouth daily.   No facility-administered encounter medications on file as of 10/06/2022.     Objective:     PHYSICAL EXAMINATION:    VITALS:   Vitals:   10/06/22 1400  BP: 113/71  Pulse: 74  Resp: 18  SpO2: 95%  Weight: 208 lb (94.3 kg)  Height: 5\' 7"  (1.702 m)    GEN:  The patient appears stated age and is in NAD. HEENT:  Normocephalic, atraumatic.   Neurological examination:  General: NAD, well-groomed, appears stated age. Orientation: The patient is alert. Oriented to person, place and date Cranial nerves: There is good facial symmetry.The speech is fluent and clear. No aphasia or dysarthria. Fund of knowledge is appropriate. Recent memory impaired and remote memory is normal.  Attention and concentration are normal.  Able to name objects and repeat phrases.  Hearing is decreased to conversational tone .   Delayed recall  Sensation: Sensation is intact to light touch throughout Motor: Strength is at least antigravity x4. Tremors: none  DTR's 2/4 in UE/LE      03/31/2022    1:00 PM  Montreal Cognitive  Assessment   Visuospatial/ Executive (0/5) 5  Naming (0/3) 3  Attention: Read list of digits (0/2) 2  Attention: Read list of letters (0/1) 1  Attention: Serial 7 subtraction starting at 100 (0/3) 3  Language: Repeat phrase (0/2) 2  Language : Fluency (0/1) 1  Abstraction (0/2) 2  Delayed Recall (0/5) 0  Orientation (0/6) 6  Total 25  Adjusted Score (based on education) 25       06/30/2021    2:09 PM 04/21/2020    4:31 PM  MMSE - Mini Mental State Exam  Orientation to time 5 5  Orientation to Place 5 5  Registration 3 3  Attention/ Calculation 5 5  Recall 2 1  Recall-comments  problems with recall  Language- name 2 objects 2 2  Language- repeat 1 1  Language- follow 3 step command 3 3  Language- read & follow direction 1 1  Write a sentence 1 1  Copy design 1 1  Total score 29 28       Movement examination: Tone: There is normal tone in the UE/LE Abnormal movements:  no tremor.  No myoclonus.  No asterixis.   Coordination:  There is no decremation with RAM's. Normal finger to nose  Gait and Station: The patient has no difficulty arising out of a deep-seated chair without the use of the hands. The patient's stride length is good.  Gait is cautious and narrow.   Thank you for allowing [place] the opportunity to participate in the care of this nice patient. Please do not hesitate to contact us for any questions or concerns.   Total time spent on today's visit was 31 minutes dedicated to this patient today, preparing to see patient, examining the patient, ordering tests and/or medications and counseling the patient, documenting clinical information in the EHR or other health record, independently interpreting results and communicating results to the patient/family, discussing treatment and goals, answering patient's questions  and coordinating care.  Cc:  Blane Ohara, MD  Marlowe Kays 10/06/2022 3:43 PM

## 2022-10-13 ENCOUNTER — Other Ambulatory Visit: Payer: Self-pay | Admitting: Family Medicine

## 2022-10-13 DIAGNOSIS — R413 Other amnesia: Secondary | ICD-10-CM

## 2022-10-20 ENCOUNTER — Other Ambulatory Visit: Payer: Self-pay | Admitting: Family Medicine

## 2022-10-20 DIAGNOSIS — E782 Mixed hyperlipidemia: Secondary | ICD-10-CM

## 2022-10-20 DIAGNOSIS — I25118 Atherosclerotic heart disease of native coronary artery with other forms of angina pectoris: Secondary | ICD-10-CM

## 2022-10-24 NOTE — Assessment & Plan Note (Signed)
Well controlled.  Continue with Repatha. Continue to work on eating a healthy diet and exercise.  Labs drawn today.

## 2022-10-24 NOTE — Assessment & Plan Note (Signed)
Hemoglobin A1c 6.3%, 3 month avg of blood sugars, is in prediabetic range.  In order to prevent progression to diabetes, recommend low carb diet and regular exercise  

## 2022-10-24 NOTE — Assessment & Plan Note (Signed)
Previously well controlled Continue Synthroid at current dose  Recheck TSH and adjust Synthroid as indicated   

## 2022-10-24 NOTE — Progress Notes (Signed)
Subjective:  Patient ID: Jeffery Turner, male    DOB: Aug 20, 1940  Age: 82 y.o. MRN: [id number]  Chief Complaint  Patient presents with   Medical Management of Chronic Issues    HPI   Alzheimer's dementia: Patient is taking Namenda 10 mg twice daily .  Aricept 10 mg before bed placed on hold per Neurology because of loose stools. .  Patient continues to have some memory problems.    Hypothyroidism: He takes Levothyroxine 25 mcg daily.   CORONARY ARTERY DISEASE: Repatha, NTG. Aspirin. No significant chest pain or breathing issues.  Sees cardiology.  Prediabetic: Last A1c was 6.3.  We will recheck today.  Eats healthy.  Active. Taking Metformin  Paroxysmal Atrial fibrillation: not currently on medicines.     10/25/2022    9:20 AM 09/28/2022    3:28 PM 06/22/2022   10:17 AM 03/16/2022    3:31 PM 01/13/2022    9:04 AM  Depression screen PHQ 2/9  Decreased Interest 0 0 0 0 0  Down, Depressed, Hopeless 0 0 0 0 0  PHQ - 2 Score 0 0 0 0 0        10/25/2022    9:19 AM  Fall Risk   Falls in the past year? 0  Number falls in past yr: 0  Injury with Fall? 0  Risk for fall due to : Impaired balance/gait  Follow up Falls evaluation completed;Falls prevention discussed    Patient Care Team: Blane Ohara, MD as PCP - General (Family Medicine) Baldo Daub, MD as PCP - Cardiology (Cardiology)   Review of Systems  Constitutional:  Negative for chills and fever.  HENT:  Negative for congestion, rhinorrhea and sore throat.   Respiratory:  Positive for shortness of breath. Negative for cough.   Cardiovascular:  Positive for chest pain. Negative for palpitations.  Gastrointestinal:  Positive for diarrhea (loose stools). Negative for abdominal pain, constipation, nausea and vomiting.  Genitourinary:  Positive for frequency. Negative for dysuria and urgency.  Musculoskeletal:  Positive for arthralgias. Negative for back pain and myalgias.  Neurological:  Positive for weakness (lower legs).  Negative for dizziness, numbness and headaches.  Psychiatric/Behavioral:  Negative for dysphoric mood. The patient is not nervous/anxious.     Current Outpatient Medications on File Prior to Visit  Medication Sig Dispense Refill   aspirin EC 81 MG tablet Take 1 tablet (81 mg total) by mouth daily. Swallow whole. 90 tablet 3   levothyroxine (SYNTHROID) 25 MCG tablet TAKE 1 TABLET EVERY DAY BEFORE BREAKFAST 90 tablet 3   memantine (NAMENDA) 10 MG tablet TAKE 1 TABLET TWICE DAILY 180 tablet 0   metFORMIN (GLUCOPHAGE) 500 MG tablet TAKE 1 TABLET EVERY DAY WITH BREAKFAST 90 tablet 3   nitroGLYCERIN (NITROSTAT) 0.4 MG SL tablet Place 1 tablet (0.4 mg total) under the tongue every 5 (five) minutes as needed. If require 2, recommend call 911. 50 tablet 0   pantoprazole (PROTONIX) 40 MG tablet Take 1 tablet (40 mg total) by mouth daily. 90 tablet 1   REPATHA 140 MG/ML SOSY INJECT 140MG  UNDER THE SKIN EVERY 2 WEEKS 6 mL 3   donepezil (ARICEPT) 10 MG tablet Take 1 tablet (10 mg total) by mouth at bedtime. (Patient not taking: Reported on 10/25/2022) 90 tablet 1   No current facility-administered medications on file prior to visit.   Past Medical History:  Diagnosis Date   Acoustic neuroma    Anemia    Angina pectoris 02/06/2018   Apnea  08/11/2021   Bilateral leg weakness 04/04/2021   Constant exophthalmos    right   Coronary artery disease of native artery of native heart with stable angina pectoris 07/21/2010   s/p stent to CFX 1997 2/2 MI s/p DES to mRCA and DES mLAD 07/14/10 2/2 [place] cath 07/14/10: dLAD 60%; CFX stent ok; OM1 30-40%; pRCA 30%; PDA 40%; EF 60%   Daytime somnolence 08/11/2021   Epistaxis 07/21/2010   Mild cognitive impairment of uncertain or unknown etiology 07/27/2022   Mixed hyperlipidemia 07/21/2010   Muscle cramp 01/18/2022   Old myocardial infarction 07/21/2010   Osteoarthritis 07/21/2010   PAD (peripheral artery disease) 08/11/2021   PMR (polymyalgia rheumatica)  04/03/2021   Prediabetes 08/11/2021   S/P CABG x 3 02/05/2020   Secondary hypothyroidism 02/13/2021   Sensorineural hearing loss, bilateral    Shortness of breath 07/21/2010   Statin myopathy 04/04/2021   Thyroid disease    Transient visual loss, right    wears glasses   Ulnar neuropathy of both upper extremities 04/20/2022   Upper respiratory tract infection due to COVID-19 virus 05/14/2021   Past Surgical History:  Procedure Laterality Date   CARDIAC CATHETERIZATION     CORONARY ARTERY BYPASS GRAFT N/A 02/05/2020   Procedure: CORONARY ARTERY BYPASS GRAFTING (CABG), ON PUMP, TIMES THREE, USING LEFT INTERNAL MAMMARY ARTERY TO LEFT ANTERIOR DESCENDING ARTERY, REVERSE SAPHENOUS VEIN GRAFT TO POSTERIOR DESCENDING ARTERY AND CIRCUMFLEX ARTERY;  Surgeon: Delight Ovens, MD;  Location: [facility] OR;  Service: Open Heart Surgery;  Laterality: N/A;   ENDOVEIN HARVEST OF GREATER SAPHENOUS VEIN Right 02/05/2020   Procedure: ENDOVEIN HARVEST OF GREATER SAPHENOUS VEIN;  Surgeon: Delight Ovens, MD;  Location: [facility] OR;  Service: Open Heart Surgery;  Laterality: Right;   LEFT HEART CATH AND CORONARY ANGIOGRAPHY N/A 01/25/2020   Procedure: LEFT HEART CATH AND CORONARY ANGIOGRAPHY;  Surgeon: Marykay Lex, MD;  Location: [facility] INVASIVE CV LAB;  Service: Cardiovascular;  Laterality: N/A;   TEE WITHOUT CARDIOVERSION N/A 02/05/2020   Procedure: TRANSESOPHAGEAL ECHOCARDIOGRAM (TEE);  Surgeon: Delight Ovens, MD;  Location: [facility] OR;  Service: Open Heart Surgery;  Laterality: N/A;   TONSILLECTOMY AND ADENOIDECTOMY     WISDOM TOOTH EXTRACTION      Family History  Problem Relation Age of Onset   Heart failure Mother    Alzheimer's disease Mother    Dementia Mother    Dementia Sister    Alzheimer's disease Sister    Memory loss Brother    Coronary artery disease Other    Social History   Socioeconomic History   Marital status: Married    Spouse name: Not on file   Number of children: 2   Years of  education: 14   Highest education level: Some college, no degree  Occupational History   Occupation: Retired    Comment: Holiday representative  Tobacco Use   Smoking status: Former    Packs/day: 1.50    Years: 37.00    Additional pack years: 0.00    Total pack years: 55.50    Types: Cigarettes    Quit date: 05/2000    Years since quitting: 22.4   Smokeless tobacco: Never  Vaping Use   Vaping Use: Never used  Substance and Sexual Activity   Alcohol use: No   Drug use: No   Sexual activity: Not Currently  Other Topics Concern   Not on file  Social History Narrative   He lives in [place] with family.  He is a retired  forklift driver.  He quit tobacco in 1997 with approximately 30-pack-  year history and denies alcohol or drug abuse.   Right handed   Drinks caffeine   Two story home      Social Determinants of Health   Financial Resource Strain: Not on file  Food Insecurity: Not on file  Transportation Needs: Not on file  Physical Activity: Sufficiently Active (05/18/2020)   Exercise Vital Sign    Days of Exercise per Week: 5 days    Minutes of Exercise per Session: 30 min  Stress: Stress Concern Present (05/18/2020)   [employer] of Occupational Health - Occupational Stress Questionnaire    Feeling of Stress : To some extent  Social Connections: Unknown (05/18/2020)   Social Connection and Isolation Panel [NHANES]    Frequency of Communication with Friends and Family: Not on file    Frequency of Social Gatherings with Friends and Family: Not on file    Attends Religious Services: Not on file    Active Member of Clubs or Organizations: Not on file    Attends Banker Meetings: Not on file    Marital Status: Married    Objective:  BP 114/64   Pulse 72   Temp (!) 96.7 F (35.9 C)   Resp 18   Ht 5\' 7"  (1.702 m)   Wt 203 lb (92.1 kg)   BMI 31.79 kg/m      10/25/2022    9:13 AM 10/06/2022    2:00 PM 09/28/2022    3:22 PM  BP/Weight  Systolic BP 114 113  105  Diastolic BP 64 71 60  Wt. (Lbs) 203 208 205  BMI 31.79 kg/m2 32.58 kg/m2 32.11 kg/m2    Physical Exam Constitutional:      Appearance: Normal appearance.  HENT:     Right Ear: Tympanic membrane, ear canal and external ear normal.     Left Ear: Tympanic membrane, ear canal and external ear normal.     Nose: Nose normal. No congestion or rhinorrhea.     Mouth/Throat:     Mouth: Mucous membranes are moist.     Pharynx: No oropharyngeal exudate or posterior oropharyngeal erythema.  Cardiovascular:     Rate and Rhythm: Normal rate and regular rhythm.     Heart sounds: Normal heart sounds.  Pulmonary:     Effort: Pulmonary effort is normal. No respiratory distress.     Breath sounds: Normal breath sounds. No wheezing, rhonchi or rales.  Abdominal:     General: Bowel sounds are normal.     Palpations: Abdomen is soft.     Tenderness: There is no abdominal tenderness.  Lymphadenopathy:     Cervical: No cervical adenopathy.  Neurological:     Mental Status: He is alert.  Psychiatric:        Mood and Affect: Mood normal.        Behavior: Behavior normal.     Diabetic Foot Exam - Simple   No data filed      Lab Results  Component Value Date   WBC 5.9 10/25/2022   HGB 14.6 10/25/2022   HCT 44.3 10/25/2022   PLT 287 10/25/2022   GLUCOSE 96 10/25/2022   CHOL 143 10/25/2022   TRIG 131 10/25/2022   HDL 33 (L) 10/25/2022   LDLDIRECT 152.5 10/05/2010   LDLCALC 86 10/25/2022   ALT 15 10/25/2022   AST 16 10/25/2022   NA 136 10/25/2022   K 5.1 10/25/2022   CL 101 10/25/2022  CREATININE 0.87 10/25/2022   BUN 17 10/25/2022   CO2 23 10/25/2022   TSH 1.280 10/25/2022   INR 1.4 (H) 02/05/2020   HGBA1C 6.2 (H) 10/25/2022      Assessment & Plan:    Secondary hypothyroidism Assessment & Plan: Previously well controlled Continue Synthroid at current dose  Recheck TSH and adjust Synthroid as indicated    Orders: -     T4, free -     TSH  Prediabetes Assessment  & Plan: Hemoglobin A1c 6.3%, 3 month avg of blood sugars, is in prediabetic range.  In order to prevent progression to diabetes, recommend low carb diet and regular exercise   Orders: -     Comprehensive metabolic panel -     Hemoglobin A1c  Mixed hyperlipidemia Assessment & Plan: Well controlled.  Continue with Repatha. Continue to work on eating a healthy diet and exercise.  Labs drawn today.    Orders: -     CBC with Differential/Platelet -     Comprehensive metabolic panel -     Lipid panel  Other orders -     Cardiovascular Risk Assessment     No orders of the defined types were placed in this encounter.   Orders Placed This Encounter  Procedures   CBC with Differential/Platelet   Comprehensive metabolic panel   Hemoglobin A1c   Lipid panel   T4, free   TSH   Cardiovascular Risk Assessment     Follow-up: No follow-ups on file.   I,Marla I Leal-Borjas,acting as a scribe for Blane Ohara, MD.,have documented all relevant documentation on the behalf of Blane Ohara, MD,as directed by  Blane Ohara, MD while in the presence of Blane Ohara, MD.   An After Visit Summary was printed and given to the patient.  Blane Ohara, MD [facility] [phone]

## 2022-10-25 ENCOUNTER — Ambulatory Visit (INDEPENDENT_AMBULATORY_CARE_PROVIDER_SITE_OTHER): Payer: Medicare HMO | Admitting: Family Medicine

## 2022-10-25 ENCOUNTER — Ambulatory Visit: Payer: Medicare HMO | Admitting: Family Medicine

## 2022-10-25 VITALS — BP 114/64 | HR 72 | Temp 96.7°F | Resp 18 | Ht 67.0 in | Wt 203.0 lb

## 2022-10-25 DIAGNOSIS — E038 Other specified hypothyroidism: Secondary | ICD-10-CM

## 2022-10-25 DIAGNOSIS — F0280 Dementia in other diseases classified elsewhere without behavioral disturbance: Secondary | ICD-10-CM

## 2022-10-25 DIAGNOSIS — I480 Paroxysmal atrial fibrillation: Secondary | ICD-10-CM | POA: Diagnosis not present

## 2022-10-25 DIAGNOSIS — E782 Mixed hyperlipidemia: Secondary | ICD-10-CM | POA: Diagnosis not present

## 2022-10-25 DIAGNOSIS — G309 Alzheimer's disease, unspecified: Secondary | ICD-10-CM | POA: Diagnosis not present

## 2022-10-25 DIAGNOSIS — R7303 Prediabetes: Secondary | ICD-10-CM

## 2022-10-26 LAB — LIPID PANEL
Chol/HDL Ratio: 4.3 ratio (ref 0.0–5.0)
Cholesterol, Total: 143 mg/dL (ref 100–199)
HDL: 33 mg/dL — ABNORMAL LOW (ref >39)
LDL Chol Calc (NIH): 86 mg/dL (ref 0–99)
Triglycerides: 131 mg/dL (ref 0–149)
VLDL Cholesterol Cal: 24 mg/dL (ref 5–40)

## 2022-10-26 LAB — CBC WITH DIFFERENTIAL/PLATELET
Basophils Absolute: 0 10*3/uL (ref 0.0–0.2)
Basos: 1 %
EOS (ABSOLUTE): 0.1 10*3/uL (ref 0.0–0.4)
Eos: 2 %
Hematocrit: 44.3 % (ref 37.5–51.0)
Hemoglobin: 14.6 g/dL (ref 13.0–17.7)
Immature Grans (Abs): 0 10*3/uL (ref 0.0–0.1)
Immature Granulocytes: 1 %
Lymphocytes Absolute: 1.3 10*3/uL (ref 0.7–3.1)
Lymphs: 22 %
MCH: 28.6 pg (ref 26.6–33.0)
MCHC: 33 g/dL (ref 31.5–35.7)
MCV: 87 fL (ref 79–97)
Monocytes Absolute: 0.9 10*3/uL (ref 0.1–0.9)
Monocytes: 15 %
Neutrophils Absolute: 3.5 10*3/uL (ref 1.4–7.0)
Neutrophils: 59 %
Platelets: 287 10*3/uL (ref 150–450)
RBC: 5.1 x10E6/uL (ref 4.14–5.80)
RDW: 13.4 % (ref 11.6–15.4)
WBC: 5.9 10*3/uL (ref 3.4–10.8)

## 2022-10-26 LAB — COMPREHENSIVE METABOLIC PANEL
ALT: 15 IU/L (ref 0–44)
AST: 16 IU/L (ref 0–40)
Albumin/Globulin Ratio: 2.1 (ref 1.2–2.2)
Albumin: 4.6 g/dL (ref 3.7–4.7)
Alkaline Phosphatase: 61 IU/L (ref 44–121)
BUN/Creatinine Ratio: 20 (ref 10–24)
BUN: 17 mg/dL (ref 8–27)
Bilirubin Total: 0.4 mg/dL (ref 0.0–1.2)
CO2: 23 mmol/L (ref 20–29)
Calcium: 9.5 mg/dL (ref 8.6–10.2)
Chloride: 101 mmol/L (ref 96–106)
Creatinine, Ser: 0.87 mg/dL (ref 0.76–1.27)
Globulin, Total: 2.2 g/dL (ref 1.5–4.5)
Glucose: 96 mg/dL (ref 70–99)
Potassium: 5.1 mmol/L (ref 3.5–5.2)
Sodium: 136 mmol/L (ref 134–144)
Total Protein: 6.8 g/dL (ref 6.0–8.5)
eGFR: 87 mL/min/{1.73_m2} (ref >59)

## 2022-10-26 LAB — TSH: TSH: 1.28 u[IU]/mL (ref 0.450–4.500)

## 2022-10-26 LAB — HEMOGLOBIN A1C
Est. average glucose Bld gHb Est-mCnc: 131 mg/dL
Hgb A1c MFr Bld: 6.2 % — ABNORMAL HIGH (ref 4.8–5.6)

## 2022-10-26 LAB — T4, FREE: Free T4: 1.41 ng/dL (ref 0.82–1.77)

## 2022-10-29 ENCOUNTER — Encounter: Payer: Self-pay | Admitting: Family Medicine

## 2022-11-04 DIAGNOSIS — I700 Atherosclerosis of aorta: Secondary | ICD-10-CM | POA: Diagnosis not present

## 2022-11-04 DIAGNOSIS — M170 Bilateral primary osteoarthritis of knee: Secondary | ICD-10-CM | POA: Diagnosis not present

## 2022-11-04 DIAGNOSIS — M1711 Unilateral primary osteoarthritis, right knee: Secondary | ICD-10-CM | POA: Diagnosis not present

## 2022-11-04 DIAGNOSIS — M1712 Unilateral primary osteoarthritis, left knee: Secondary | ICD-10-CM | POA: Diagnosis not present

## 2022-11-04 DIAGNOSIS — Q741 Congenital malformation of knee: Secondary | ICD-10-CM | POA: Diagnosis not present

## 2022-11-04 DIAGNOSIS — M25462 Effusion, left knee: Secondary | ICD-10-CM | POA: Diagnosis not present

## 2022-12-13 DIAGNOSIS — M1711 Unilateral primary osteoarthritis, right knee: Secondary | ICD-10-CM | POA: Diagnosis not present

## 2022-12-25 ENCOUNTER — Other Ambulatory Visit: Payer: Self-pay | Admitting: Family Medicine

## 2022-12-25 DIAGNOSIS — R413 Other amnesia: Secondary | ICD-10-CM

## 2023-01-10 DIAGNOSIS — E119 Type 2 diabetes mellitus without complications: Secondary | ICD-10-CM | POA: Diagnosis not present

## 2023-01-10 DIAGNOSIS — M1712 Unilateral primary osteoarthritis, left knee: Secondary | ICD-10-CM | POA: Diagnosis not present

## 2023-01-12 ENCOUNTER — Encounter: Payer: Medicare HMO | Admitting: Psychology

## 2023-01-19 ENCOUNTER — Encounter: Payer: Medicare HMO | Admitting: Psychology

## 2023-01-26 ENCOUNTER — Other Ambulatory Visit: Payer: Self-pay | Admitting: Family Medicine

## 2023-02-03 ENCOUNTER — Ambulatory Visit: Payer: Medicare HMO

## 2023-02-03 VITALS — Ht 67.0 in | Wt 212.0 lb

## 2023-02-03 DIAGNOSIS — Z0000 Encounter for general adult medical examination without abnormal findings: Secondary | ICD-10-CM | POA: Diagnosis not present

## 2023-02-03 NOTE — Patient Instructions (Addendum)
Mr. Tomita , Thank you for taking time to come for your Medicare Wellness Visit. I appreciate your ongoing commitment to your health goals. Please review the following plan we discussed and let me know if I can assist you in the future.   Referrals/Orders/Follow-Ups/Clinician Recommendations:    This is a list of the screening recommended for you and due dates:  Health Maintenance  Topic Date Due   COVID-19 Vaccine (3 - 2023-24 season) 02/12/2022   Flu Shot  01/13/2023   Pneumonia Vaccine (1 of 1 - PCV) 02/14/2023*   Zoster (Shingles) Vaccine (1 of 2) 02/14/2023*   Medicare Annual Wellness Visit  02/03/2024   DTaP/Tdap/Td vaccine (2 - Td or Tdap) 04/07/2025   HPV Vaccine  Aged Out  *Topic was postponed. The date shown is not the original due date.    Advanced directives: (Declined) Advance directive discussed with you today. Even though you declined this today, please call our office should you change your mind, and we can give you the proper paperwork for you to fill out.  Next Medicare Annual Wellness Visit scheduled for next year: Yes

## 2023-02-03 NOTE — Progress Notes (Signed)
Subjective:   Jeffery Turner is a 82 y.o. male who presents for Medicare Annual/Subsequent preventive examination.  Visit Complete: Virtual  I connected with  Jeffery Turner on 02/03/23 by a audio enabled telemedicine application and verified that I am speaking with the correct person using two identifiers.  Patient Location: Home  Provider Location: Home Office  I discussed the limitations of evaluation and management by telemedicine. The patient expressed understanding and agreed to proceed.   Review of Systems    Vital Signs: Unable to obtain new vitals due to this being a telehealth visit.  Cardiac Risk Factors include: advanced age (>92men, >47 women);male gender;Other (see comment), Risk factor comments: Dx: CAD     Objective:    Today's Vitals   02/03/23 1403  Weight: 212 lb (96.2 kg)  Height: 5\' 7"  (1.702 m)   Body mass index is 33.2 kg/m.     02/03/2023    2:12 PM 10/06/2022    2:24 PM 03/31/2022    1:16 PM 04/23/2020    1:40 PM 02/08/2020   12:00 PM 02/01/2020    9:51 AM 01/25/2020    8:28 AM  Advanced Directives  Does Patient Have a Medical Advance Directive? No Yes Yes No No No No  Would patient like information on creating a medical advance directive? No - Patient declined    Yes (MAU/Ambulatory/Procedural Areas - Information given) No - Patient declined Yes (MAU/Ambulatory/Procedural Areas - Information given)    Current Medications (verified) Outpatient Encounter Medications as of 02/03/2023  Medication Sig   aspirin EC 81 MG tablet Take 1 tablet (81 mg total) by mouth daily. Swallow whole.   donepezil (ARICEPT) 10 MG tablet TAKE 1 TABLET AT BEDTIME   levothyroxine (SYNTHROID) 25 MCG tablet TAKE 1 TABLET EVERY DAY BEFORE BREAKFAST   memantine (NAMENDA) 10 MG tablet TAKE 1 TABLET TWICE DAILY   metFORMIN (GLUCOPHAGE) 500 MG tablet TAKE 1 TABLET EVERY DAY WITH BREAKFAST   nitroGLYCERIN (NITROSTAT) 0.4 MG SL tablet Place 1 tablet (0.4 mg total) under the tongue  every 5 (five) minutes as needed. If require 2, recommend call 911.   pantoprazole (PROTONIX) 40 MG tablet Take 1 tablet (40 mg total) by mouth daily.   REPATHA 140 MG/ML SOSY INJECT 140MG  UNDER THE SKIN EVERY 2 WEEKS   No facility-administered encounter medications on file as of 02/03/2023.    Allergies (verified) Bactrim [sulfamethoxazole-trimethoprim], Codeine, and Statins   History: Past Medical History:  Diagnosis Date   Acoustic neuroma    Anemia    Angina pectoris 02/06/2018   Apnea 08/11/2021   Bilateral leg weakness 04/04/2021   Constant exophthalmos    right   Coronary artery disease of native artery of native heart with stable angina pectoris 07/21/2010   s/p stent to CFX 1997 2/2 MI s/p DES to [facility] and DES mLAD 07/14/10 2/2 [place] cath 07/14/10: dLAD 60%; CFX stent ok; OM1 30-40%; pRCA 30%; PDA 40%; EF 60%   Daytime somnolence 08/11/2021   Epistaxis 07/21/2010   Mild cognitive impairment of uncertain or unknown etiology 07/27/2022   Mixed hyperlipidemia 07/21/2010   Muscle cramp 01/18/2022   Old myocardial infarction 07/21/2010   Osteoarthritis 07/21/2010   PAD (peripheral artery disease) 08/11/2021   PMR (polymyalgia rheumatica) 04/03/2021   Prediabetes 08/11/2021   S/P CABG x 3 02/05/2020   Secondary hypothyroidism 02/13/2021   Sensorineural hearing loss, bilateral    Shortness of breath 07/21/2010   Statin myopathy 04/04/2021   Thyroid disease  Transient visual loss, right    wears glasses   Ulnar neuropathy of both upper extremities 04/20/2022   Upper respiratory tract infection due to COVID-19 virus 05/14/2021   Past Surgical History:  Procedure Laterality Date   CARDIAC CATHETERIZATION     CORONARY ARTERY BYPASS GRAFT N/A 02/05/2020   Procedure: CORONARY ARTERY BYPASS GRAFTING (CABG), ON PUMP, TIMES THREE, USING LEFT INTERNAL MAMMARY ARTERY TO LEFT ANTERIOR DESCENDING ARTERY, REVERSE SAPHENOUS VEIN GRAFT TO POSTERIOR DESCENDING ARTERY AND CIRCUMFLEX ARTERY;   Surgeon: Delight Ovens, MD;  Location: [facility] OR;  Service: Open Heart Surgery;  Laterality: N/A;   ENDOVEIN HARVEST OF GREATER SAPHENOUS VEIN Right 02/05/2020   Procedure: ENDOVEIN HARVEST OF GREATER SAPHENOUS VEIN;  Surgeon: Delight Ovens, MD;  Location: [facility] OR;  Service: Open Heart Surgery;  Laterality: Right;   LEFT HEART CATH AND CORONARY ANGIOGRAPHY N/A 01/25/2020   Procedure: LEFT HEART CATH AND CORONARY ANGIOGRAPHY;  Surgeon: Marykay Lex, MD;  Location: [facility] INVASIVE CV LAB;  Service: Cardiovascular;  Laterality: N/A;   TEE WITHOUT CARDIOVERSION N/A 02/05/2020   Procedure: TRANSESOPHAGEAL ECHOCARDIOGRAM (TEE);  Surgeon: Delight Ovens, MD;  Location: [facility] OR;  Service: Open Heart Surgery;  Laterality: N/A;   TONSILLECTOMY AND ADENOIDECTOMY     WISDOM TOOTH EXTRACTION     Family History  Problem Relation Age of Onset   Heart failure Mother    Alzheimer's disease Mother    Dementia Mother    Dementia Sister    Alzheimer's disease Sister    Memory loss Brother    Coronary artery disease Other    Social History   Socioeconomic History   Marital status: Married    Spouse name: Not on file   Number of children: 2   Years of education: 14   Highest education level: Some college, no degree  Occupational History   Occupation: Retired    Comment: Holiday representative  Tobacco Use   Smoking status: Former    Current packs/day: 0.00    Average packs/day: 1.5 packs/day for 37.0 years (55.5 ttl pk-yrs)    Types: Cigarettes    Start date: 05/1963    Quit date: 05/2000    Years since quitting: 22.7   Smokeless tobacco: Never  Vaping Use   Vaping status: Never Used  Substance and Sexual Activity   Alcohol use: No   Drug use: No   Sexual activity: Not Currently  Other Topics Concern   Not on file  Social History Narrative   He lives in [place] with family.  He is a retired  Museum/gallery exhibitions officer.  He quit tobacco in 1997 with approximately 30-pack-  year history and denies alcohol  or drug abuse.   Right handed   Drinks caffeine   Two story home      Social Determinants of Health   Financial Resource Strain: Low Risk  (02/03/2023)   Overall Financial Resource Strain (CARDIA)    Difficulty of Paying Living Expenses: Not hard at all  Food Insecurity: No Food Insecurity (02/03/2023)   Hunger Vital Sign    Worried About Running Out of Food in the Last Year: Never true    Ran Out of Food in the Last Year: Never true  Transportation Needs: No Transportation Needs (02/03/2023)   PRAPARE - Administrator, Civil Service (Medical): No    Lack of Transportation (Non-Medical): No  Physical Activity: Inactive (02/03/2023)   Exercise Vital Sign    Days of Exercise per Week: 0 days  Minutes of Exercise per Session: 0 min  Stress: No Stress Concern Present (02/03/2023)   [employer] of Occupational Health - Occupational Stress Questionnaire    Feeling of Stress : Not at all  Social Connections: Moderately Isolated (02/03/2023)   Social Connection and Isolation Panel [NHANES]    Frequency of Communication with Friends and Family: More than three times a week    Frequency of Social Gatherings with Friends and Family: More than three times a week    Attends Religious Services: Never    Database administrator or Organizations: No    Attends Engineer, structural: Never    Marital Status: Married    Tobacco Counseling Counseling given: Not Answered   Clinical Intake:  Pre-visit preparation completed: Yes  Pain : No/denies pain     BMI - recorded: 33.2 Nutritional Status: BMI > 30  Obese Nutritional Risks: None Diabetes: No  How often do you need to have someone help you when you read instructions, pamphlets, or other written materials from your doctor or pharmacy?: 1 - Never  Interpreter Needed?: No  Information entered by :: Theresa Mulligan LPN   Activities of Daily Living    02/03/2023    2:09 PM 09/28/2022    3:28 PM  In your  present state of health, do you have any difficulty performing the following activities:  Hearing? 0 0  Vision? 0 0  Difficulty concentrating or making decisions? 0 0  Walking or climbing stairs? 0 0  Dressing or bathing? 0 0  Doing errands, shopping? 0 0  Preparing Food and eating ? N   Using the Toilet? N   In the past six months, have you accidently leaked urine? N   Do you have problems with loss of bowel control? N   Managing your Medications? N   Managing your Finances? N   Housekeeping or managing your Housekeeping? N     Patient Care Team: Blane Ohara, MD as PCP - General (Family Medicine) Baldo Daub, MD as PCP - Cardiology (Cardiology)  Indicate any recent Medical Services you may have received from other than [facility] providers in the past year (date may be approximate).     Assessment:   This is a routine wellness examination for Jaramiah.  Hearing/Vision screen Hearing Screening - Comments:: Wears hearing aids Vision Screening - Comments:: Wears reading glasses - up to date with routine eye exams with  [facility]  Dietary issues and exercise activities discussed:     Goals Addressed               This Visit's Progress     Stay Healthy (pt-stated)        Maintain my current health and not get worse.       Depression Screen    02/03/2023    2:09 PM 10/25/2022    9:20 AM 09/28/2022    3:28 PM 06/22/2022   10:17 AM 03/16/2022    3:31 PM 01/13/2022    9:04 AM 08/11/2021    1:36 PM  PHQ 2/9 Scores  PHQ - 2 Score 0 0 0 0 0 0 0    Fall Risk    02/03/2023    2:10 PM 10/25/2022    9:19 AM 10/06/2022    2:24 PM 09/28/2022    3:28 PM 06/22/2022   10:17 AM  Fall Risk   Falls in the past year? 0 0 0 0 0  Number falls in past yr: 0  0 0 0 0  Injury with Fall? 0 0 0 0 0  Risk for fall due to : No Fall Risks Impaired balance/gait  Impaired balance/gait No Fall Risks  Follow up Falls prevention discussed Falls evaluation completed;Falls prevention discussed  Falls evaluation completed Falls evaluation completed Falls evaluation completed    MEDICARE RISK AT HOME: Medicare Risk at Home Any stairs in or around the home?: Yes If so, are there any without handrails?: No Home free of loose throw rugs in walkways, pet beds, electrical cords, etc?: Yes Adequate lighting in your home to reduce risk of falls?: Yes Life alert?: No Use of a cane, walker or w/c?: No Grab bars in the bathroom?: No Shower chair or bench in shower?: Yes Elevated toilet seat or a handicapped toilet?: Yes  TIMED UP AND GO:  Was the test performed?  No    Cognitive Function:    06/30/2021    2:09 PM 04/21/2020    4:31 PM  MMSE - Mini Mental State Exam  Orientation to time 5 5  Orientation to Place 5 5  Registration 3 3  Attention/ Calculation 5 5  Recall 2 1  Recall-comments  problems with recall  Language- name 2 objects 2 2  Language- repeat 1 1  Language- follow 3 step command 3 3  Language- read & follow direction 1 1  Write a sentence 1 1  Copy design 1 1  Total score 29 28      03/31/2022    1:00 PM  Montreal Cognitive Assessment   Visuospatial/ Executive (0/5) 5  Naming (0/3) 3  Attention: Read list of digits (0/2) 2  Attention: Read list of letters (0/1) 1  Attention: Serial 7 subtraction starting at 100 (0/3) 3  Language: Repeat phrase (0/2) 2  Language : Fluency (0/1) 1  Abstraction (0/2) 2  Delayed Recall (0/5) 0  Orientation (0/6) 6  Total 25  Adjusted Score (based on education) 25      02/03/2023    2:12 PM 01/13/2022    9:08 AM  6CIT Screen  What Year? 0 points 0 points  What month? 0 points 0 points  What time? 0 points 0 points  Count back from 20 0 points 0 points  Months in reverse 0 points 0 points  Repeat phrase 4 points 0 points  Total Score 4 points 0 points    Immunizations Immunization History  Administered Date(s) Administered   Janssen (J&J) SARS-COV-2 Vaccination 12/26/2019, 02/26/2020   Tdap 04/08/2015     TDAP status: Up to date  Flu Vaccine status: Due, Education has been provided regarding the importance of this vaccine. Advised may receive this vaccine at local pharmacy or Health Dept. Aware to provide a copy of the vaccination record if obtained from local pharmacy or Health Dept. Verbalized acceptance and understanding.  Pneumococcal vaccine status: Declined,  Education has been provided regarding the importance of this vaccine but patient still declined. Advised may receive this vaccine at local pharmacy or Health Dept. Aware to provide a copy of the vaccination record if obtained from local pharmacy or Health Dept. Verbalized acceptance and understanding.   Covid-19 vaccine status: Declined, Education has been provided regarding the importance of this vaccine but patient still declined. Advised may receive this vaccine at local pharmacy or Health Dept.or vaccine clinic. Aware to provide a copy of the vaccination record if obtained from local pharmacy or Health Dept. Verbalized acceptance and understanding.  Qualifies for Shingles Vaccine? Yes  Zostavax completed No   Shingrix Completed?: No.    Education has been provided regarding the importance of this vaccine. Patient has been advised to call insurance company to determine out of pocket expense if they have not yet received this vaccine. Advised may also receive vaccine at local pharmacy or Health Dept. Verbalized acceptance and understanding.  Screening Tests Health Maintenance  Topic Date Due   COVID-19 Vaccine (3 - 2023-24 season) 02/12/2022   INFLUENZA VACCINE  01/13/2023   Pneumonia Vaccine 42+ Years old (1 of 1 - PCV) 02/14/2023 (Originally 06/13/2006)   Zoster Vaccines- Shingrix (1 of 2) 02/14/2023 (Originally 06/13/1960)   Medicare Annual Wellness (AWV)  02/03/2024   DTaP/Tdap/Td (2 - Td or Tdap) 04/07/2025   HPV VACCINES  Aged Out    Health Maintenance  Health Maintenance Due  Topic Date Due   COVID-19 Vaccine (3  - 2023-24 season) 02/12/2022   INFLUENZA VACCINE  01/13/2023    Colorectal cancer screening: No longer required.   Lung Cancer Screening: (Low Dose CT Chest recommended if Age 54-80 years, 20 pack-year currently smoking OR have quit w/in 15years.) does not qualify.     Additional Screening:  Hepatitis C Screening: does not qualify; Completed   Vision Screening: Recommended annual ophthalmology exams for early detection of glaucoma and other disorders of the eye. Is the patient up to date with their annual eye exam?  Yes  Who is the provider or what is the name of the office in which the patient attends annual eye exams? [facility] If pt is not established with a provider, would they like to be referred to a provider to establish care? No .   Dental Screening: Recommended annual dental exams for proper oral hygiene    Community Resource Referral / Chronic Care Management:  CRR required this visit?  No   CCM required this visit?  No     Plan:     I have personally reviewed and noted the following in the patient's chart:   Medical and social history Use of alcohol, tobacco or illicit drugs  Current medications and supplements including opioid prescriptions. Patient is not currently taking opioid prescriptions. Functional ability and status Nutritional status Physical activity Advanced directives List of other physicians Hospitalizations, surgeries, and ER visits in previous 12 months Vitals Screenings to include cognitive, depression, and falls Referrals and appointments  In addition, I have reviewed and discussed with patient certain preventive protocols, quality metrics, and best practice recommendations. A written personalized care plan for preventive services as well as general preventive health recommendations were provided to patient.     Tillie Rung, LPN   12/11/5282   After Visit Summary: (MyChart) Due to this being a telephonic visit, the after  visit summary with patients personalized plan was offered to patient via MyChart   Nurse Notes: None

## 2023-02-07 ENCOUNTER — Other Ambulatory Visit: Payer: Self-pay | Admitting: Family Medicine

## 2023-03-02 NOTE — Assessment & Plan Note (Signed)
Previously well controlled Continue Synthroid at current dose  Recheck TSH and adjust Synthroid as indicated   

## 2023-03-02 NOTE — Assessment & Plan Note (Signed)
Well controlled.  Continue with Repatha. Continue to work on eating a healthy diet and exercise.  Labs drawn today.

## 2023-03-02 NOTE — Progress Notes (Unsigned)
Subjective:  Patient ID: Jeffery Turner, male    DOB: September 06, 1940  Age: 82 y.o. MRN: [id number]  Chief Complaint  Patient presents with   Medical Management of Chronic Issues    HPI Alzheimer's dementia: Patient is taking Namenda 10 mg twice daily .  Aricept 10 mg before bed placed on hold per Neurology because of loose stools. Diarrhea has improved.  Patient continues to have some memory problems.    Hypothyroidism: He takes Levothyroxine 25 mcg daily.   CORONARY ARTERY DISEASE: Repatha, NTG. Aspirin. No significant chest pain or breathing issues.  Sees cardiology.  Prediabetic: Last A1c was 6.2%. Taking Metformin 500 mg daily. Still has some diarrhea.  Paroxysmal Atrial fibrillation: not currently on medicines.     03/03/2023    8:37 AM 02/03/2023    2:09 PM 10/25/2022    9:20 AM 09/28/2022    3:28 PM 06/22/2022   10:17 AM  Depression screen PHQ 2/9  Decreased Interest 0 0 0 0 0  Down, Depressed, Hopeless 0 0 0 0 0  PHQ - 2 Score 0 0 0 0 0        03/03/2023    8:36 AM  Fall Risk   Falls in the past year? 0  Number falls in past yr: 0  Injury with Fall? 0  Risk for fall due to : Impaired mobility  Follow up Falls evaluation completed;Falls prevention discussed    Patient Care Team: Blane Ohara, MD as PCP - General (Family Medicine) Baldo Daub, MD as PCP - Cardiology (Cardiology)   Review of Systems  Constitutional:  Negative for chills and fever.  HENT:  Negative for congestion, rhinorrhea and sore throat.   Respiratory:  Negative for cough and shortness of breath.   Cardiovascular:  Negative for chest pain and palpitations.  Gastrointestinal:  Positive for diarrhea and nausea (mild). Negative for abdominal pain, constipation and vomiting.  Genitourinary:  Negative for dysuria and urgency.  Musculoskeletal:  Negative for arthralgias (bilateral knee pain much improved), back pain and myalgias.  Neurological:  Negative for dizziness and headaches.   Psychiatric/Behavioral:  Negative for dysphoric mood. The patient is not nervous/anxious.        Memory - per patient feels it is improving. His wife does not feel he is improving.     Current Outpatient Medications on File Prior to Visit  Medication Sig Dispense Refill   aspirin EC 81 MG tablet Take 1 tablet (81 mg total) by mouth daily. Swallow whole. 90 tablet 3   donepezil (ARICEPT) 10 MG tablet TAKE 1 TABLET AT BEDTIME 90 tablet 3   levothyroxine (SYNTHROID) 25 MCG tablet TAKE 1 TABLET EVERY DAY BEFORE BREAKFAST 90 tablet 3   memantine (NAMENDA) 10 MG tablet TAKE 1 TABLET TWICE DAILY 180 tablet 1   metFORMIN (GLUCOPHAGE) 500 MG tablet TAKE 1 TABLET EVERY DAY WITH BREAKFAST 90 tablet 3   nitroGLYCERIN (NITROSTAT) 0.4 MG SL tablet Place 1 tablet (0.4 mg total) under the tongue every 5 (five) minutes as needed. If require 2, recommend call 911. 50 tablet 0   pantoprazole (PROTONIX) 40 MG tablet TAKE 1 TABLET EVERY DAY 90 tablet 3   REPATHA 140 MG/ML SOSY INJECT 140MG  UNDER THE SKIN EVERY 2 WEEKS 6 mL 3   No current facility-administered medications on file prior to visit.   Past Medical History:  Diagnosis Date   Acoustic neuroma    Anemia    Angina pectoris 02/06/2018   Apnea 08/11/2021  Bilateral leg weakness 04/04/2021   Constant exophthalmos    right   Coronary artery disease of native artery of native heart with stable angina pectoris 07/21/2010   s/p stent to CFX 1997 2/2 MI s/p DES to mRCA and DES mLAD 07/14/10 2/2 [place] cath 07/14/10: dLAD 60%; CFX stent ok; OM1 30-40%; pRCA 30%; PDA 40%; EF 60%   Daytime somnolence 08/11/2021   Epistaxis 07/21/2010   Mild cognitive impairment of uncertain or unknown etiology 07/27/2022   Mixed hyperlipidemia 07/21/2010   Muscle cramp 01/18/2022   Old myocardial infarction 07/21/2010   Osteoarthritis 07/21/2010   PAD (peripheral artery disease) 08/11/2021   PMR (polymyalgia rheumatica) 04/03/2021   Prediabetes 08/11/2021   S/P CABG x 3  02/05/2020   Secondary hypothyroidism 02/13/2021   Sensorineural hearing loss, bilateral    Shortness of breath 07/21/2010   Statin myopathy 04/04/2021   Thyroid disease    Transient visual loss, right    wears glasses   Ulnar neuropathy of both upper extremities 04/20/2022   Upper respiratory tract infection due to COVID-19 virus 05/14/2021   Past Surgical History:  Procedure Laterality Date   CARDIAC CATHETERIZATION     CORONARY ARTERY BYPASS GRAFT N/A 02/05/2020   Procedure: CORONARY ARTERY BYPASS GRAFTING (CABG), ON PUMP, TIMES THREE, USING LEFT INTERNAL MAMMARY ARTERY TO LEFT ANTERIOR DESCENDING ARTERY, REVERSE SAPHENOUS VEIN GRAFT TO POSTERIOR DESCENDING ARTERY AND CIRCUMFLEX ARTERY;  Surgeon: Delight Ovens, MD;  Location: [facility] OR;  Service: Open Heart Surgery;  Laterality: N/A;   ENDOVEIN HARVEST OF GREATER SAPHENOUS VEIN Right 02/05/2020   Procedure: ENDOVEIN HARVEST OF GREATER SAPHENOUS VEIN;  Surgeon: Delight Ovens, MD;  Location: [facility] OR;  Service: Open Heart Surgery;  Laterality: Right;   LEFT HEART CATH AND CORONARY ANGIOGRAPHY N/A 01/25/2020   Procedure: LEFT HEART CATH AND CORONARY ANGIOGRAPHY;  Surgeon: Marykay Lex, MD;  Location: [facility] INVASIVE CV LAB;  Service: Cardiovascular;  Laterality: N/A;   TEE WITHOUT CARDIOVERSION N/A 02/05/2020   Procedure: TRANSESOPHAGEAL ECHOCARDIOGRAM (TEE);  Surgeon: Delight Ovens, MD;  Location: [facility] OR;  Service: Open Heart Surgery;  Laterality: N/A;   TONSILLECTOMY AND ADENOIDECTOMY     WISDOM TOOTH EXTRACTION      Family History  Problem Relation Age of Onset   Heart failure Mother    Alzheimer's disease Mother    Dementia Mother    Dementia Sister    Alzheimer's disease Sister    Memory loss Brother    Coronary artery disease Other    Social History   Socioeconomic History   Marital status: Married    Spouse name: Not on file   Number of children: 2   Years of education: 14   Highest education level: Some college,  no degree  Occupational History   Occupation: Retired    Comment: Holiday representative  Tobacco Use   Smoking status: Former    Current packs/day: 0.00    Average packs/day: 1.5 packs/day for 37.0 years (55.5 ttl pk-yrs)    Types: Cigarettes    Start date: 05/1963    Quit date: 05/2000    Years since quitting: 22.8   Smokeless tobacco: Never  Vaping Use   Vaping status: Never Used  Substance and Sexual Activity   Alcohol use: No   Drug use: No   Sexual activity: Not Currently  Other Topics Concern   Not on file  Social History Narrative   He lives in [place] with family.  He is a retired  Chief Executive Officer  driver.  He quit tobacco in 1997 with approximately 30-pack-  year history and denies alcohol or drug abuse.   Right handed   Drinks caffeine   Two story home      Social Determinants of Health   Financial Resource Strain: Low Risk  (02/03/2023)   Overall Financial Resource Strain (CARDIA)    Difficulty of Paying Living Expenses: Not hard at all  Food Insecurity: No Food Insecurity (02/03/2023)   Hunger Vital Sign    Worried About Running Out of Food in the Last Year: Never true    Ran Out of Food in the Last Year: Never true  Transportation Needs: No Transportation Needs (02/03/2023)   PRAPARE - Administrator, Civil Service (Medical): No    Lack of Transportation (Non-Medical): No  Physical Activity: Inactive (02/03/2023)   Exercise Vital Sign    Days of Exercise per Week: 0 days    Minutes of Exercise per Session: 0 min  Stress: No Stress Concern Present (02/03/2023)   [employer] of Occupational Health - Occupational Stress Questionnaire    Feeling of Stress : Not at all  Social Connections: Moderately Isolated (02/03/2023)   Social Connection and Isolation Panel [NHANES]    Frequency of Communication with Friends and Family: More than three times a week    Frequency of Social Gatherings with Friends and Family: More than three times a week    Attends Religious  Services: Never    Database administrator or Organizations: No    Attends Engineer, structural: Never    Marital Status: Married    Objective:  BP 108/68   Pulse 72   Temp (!) 96.8 F (36 C)   Resp 16   Ht 5\' 7"  (1.702 m)   Wt 201 lb 12.8 oz (91.5 kg)   BMI 31.61 kg/m      03/03/2023    8:34 AM 02/03/2023    2:03 PM 10/25/2022    9:13 AM  BP/Weight  Systolic BP 108 -- 114  Diastolic BP 68 -- 64  Wt. (Lbs) 201.8 212 203  BMI 31.61 kg/m2 33.2 kg/m2 31.79 kg/m2    Physical Exam Vitals reviewed.  Constitutional:      Appearance: Normal appearance. He is obese.  Neck:     Vascular: No carotid bruit.  Cardiovascular:     Rate and Rhythm: Normal rate and regular rhythm.     Heart sounds: Normal heart sounds.  Pulmonary:     Effort: Pulmonary effort is normal.     Breath sounds: Normal breath sounds. No wheezing, rhonchi or rales.  Abdominal:     General: Bowel sounds are normal.     Palpations: Abdomen is soft.     Tenderness: There is no abdominal tenderness.  Neurological:     Mental Status: He is alert and oriented to person, place, and time.  Psychiatric:        Mood and Affect: Mood normal.        Behavior: Behavior normal.     Diabetic Foot Exam - Simple   No data filed      Lab Results  Component Value Date   WBC 5.6 03/03/2023   HGB 11.9 (L) 03/03/2023   HCT 36.8 (L) 03/03/2023   PLT 291 03/03/2023   GLUCOSE 85 03/03/2023   CHOL 80 (L) 03/03/2023   TRIG 104 03/03/2023   HDL 34 (L) 03/03/2023   LDLDIRECT 152.5 10/05/2010   LDLCALC 26 03/03/2023  ALT 12 03/03/2023   AST 14 03/03/2023   NA 136 03/03/2023   K 5.2 03/03/2023   CL 99 03/03/2023   CREATININE 1.04 03/03/2023   BUN 15 03/03/2023   CO2 20 03/03/2023   TSH 1.280 10/25/2022   INR 1.4 (H) 02/05/2020   HGBA1C 6.0 (H) 03/03/2023      Assessment & Plan:    Coronary artery disease of native artery of native heart with stable angina pectoris Assessment & Plan: Management  per specialist.    Secondary hypothyroidism Assessment & Plan: Well controlled Continue Synthroid at current dose    Prediabetes Assessment & Plan: Hemoglobin A1c 6.2%, 3 month avg of blood sugars, is in prediabetic range.  In order to prevent progression to diabetes, recommend low carb diet and regular exercise  Hold metformin.   Orders: -     Hemoglobin A1c  Mixed hyperlipidemia Assessment & Plan: Well controlled.  Continue with Repatha. Continue to work on eating a healthy diet and exercise.  Labs drawn today.    Orders: -     CBC with Differential/Platelet -     Comprehensive metabolic panel -     Lipid panel     No orders of the defined types were placed in this encounter.   Orders Placed This Encounter  Procedures   CBC with Differential/Platelet   Comprehensive metabolic panel   Hemoglobin A1c   Lipid panel     Follow-up: Return in about 4 months (around 07/03/2023) for chronic follow up.   I,Marla I Leal-Borjas,acting as a scribe for Blane Ohara, MD.,have documented all relevant documentation on the behalf of Blane Ohara, MD,as directed by  Blane Ohara, MD while in the presence of Blane Ohara, MD.   An After Visit Summary was printed and given to the patient.  Blane Ohara, MD [facility] [phone]

## 2023-03-02 NOTE — Assessment & Plan Note (Addendum)
Hemoglobin A1c 6.2%, 3 month avg of blood sugars, is in prediabetic range.  In order to prevent progression to diabetes, recommend low carb diet and regular exercise  Hold metformin.

## 2023-03-02 NOTE — Assessment & Plan Note (Signed)
Management per specialist.

## 2023-03-03 ENCOUNTER — Encounter: Payer: Self-pay | Admitting: Family Medicine

## 2023-03-03 ENCOUNTER — Ambulatory Visit (INDEPENDENT_AMBULATORY_CARE_PROVIDER_SITE_OTHER): Payer: Medicare HMO | Admitting: Family Medicine

## 2023-03-03 VITALS — BP 108/68 | HR 72 | Temp 96.8°F | Resp 16 | Ht 67.0 in | Wt 201.8 lb

## 2023-03-03 DIAGNOSIS — R7303 Prediabetes: Secondary | ICD-10-CM

## 2023-03-03 DIAGNOSIS — E038 Other specified hypothyroidism: Secondary | ICD-10-CM

## 2023-03-03 DIAGNOSIS — G309 Alzheimer's disease, unspecified: Secondary | ICD-10-CM

## 2023-03-03 DIAGNOSIS — E782 Mixed hyperlipidemia: Secondary | ICD-10-CM

## 2023-03-03 DIAGNOSIS — F02B0 Dementia in other diseases classified elsewhere, moderate, without behavioral disturbance, psychotic disturbance, mood disturbance, and anxiety: Secondary | ICD-10-CM | POA: Diagnosis not present

## 2023-03-03 DIAGNOSIS — F0280 Dementia in other diseases classified elsewhere without behavioral disturbance: Secondary | ICD-10-CM | POA: Diagnosis not present

## 2023-03-03 DIAGNOSIS — G301 Alzheimer's disease with late onset: Secondary | ICD-10-CM | POA: Diagnosis not present

## 2023-03-03 DIAGNOSIS — I25118 Atherosclerotic heart disease of native coronary artery with other forms of angina pectoris: Secondary | ICD-10-CM | POA: Diagnosis not present

## 2023-03-03 DIAGNOSIS — D6489 Other specified anemias: Secondary | ICD-10-CM | POA: Diagnosis not present

## 2023-03-03 NOTE — Patient Instructions (Addendum)
**  Hold metformin**

## 2023-03-04 LAB — COMPREHENSIVE METABOLIC PANEL
ALT: 12 IU/L (ref 0–44)
AST: 14 IU/L (ref 0–40)
Albumin: 4.3 g/dL (ref 3.7–4.7)
Alkaline Phosphatase: 57 IU/L (ref 44–121)
BUN/Creatinine Ratio: 14 (ref 10–24)
BUN: 15 mg/dL (ref 8–27)
Bilirubin Total: 0.4 mg/dL (ref 0.0–1.2)
CO2: 20 mmol/L (ref 20–29)
Calcium: 9.1 mg/dL (ref 8.6–10.2)
Chloride: 99 mmol/L (ref 96–106)
Creatinine, Ser: 1.04 mg/dL (ref 0.76–1.27)
Globulin, Total: 2.3 g/dL (ref 1.5–4.5)
Glucose: 85 mg/dL (ref 70–99)
Potassium: 5.2 mmol/L (ref 3.5–5.2)
Sodium: 136 mmol/L (ref 134–144)
Total Protein: 6.6 g/dL (ref 6.0–8.5)
eGFR: 72 mL/min/{1.73_m2} (ref >59)

## 2023-03-04 LAB — CBC WITH DIFFERENTIAL/PLATELET
Basophils Absolute: 0 10*3/uL (ref 0.0–0.2)
Basos: 0 %
EOS (ABSOLUTE): 0.1 10*3/uL (ref 0.0–0.4)
Eos: 2 %
Hematocrit: 36.8 % — ABNORMAL LOW (ref 37.5–51.0)
Hemoglobin: 11.9 g/dL — ABNORMAL LOW (ref 13.0–17.7)
Immature Grans (Abs): 0 10*3/uL (ref 0.0–0.1)
Immature Granulocytes: 1 %
Lymphocytes Absolute: 1.2 10*3/uL (ref 0.7–3.1)
Lymphs: 22 %
MCH: 28.1 pg (ref 26.6–33.0)
MCHC: 32.3 g/dL (ref 31.5–35.7)
MCV: 87 fL (ref 79–97)
Monocytes Absolute: 0.8 10*3/uL (ref 0.1–0.9)
Monocytes: 14 %
Neutrophils Absolute: 3.5 10*3/uL (ref 1.4–7.0)
Neutrophils: 61 %
Platelets: 291 10*3/uL (ref 150–450)
RBC: 4.24 x10E6/uL (ref 4.14–5.80)
RDW: 12.6 % (ref 11.6–15.4)
WBC: 5.6 10*3/uL (ref 3.4–10.8)

## 2023-03-04 LAB — HEMOGLOBIN A1C
Est. average glucose Bld gHb Est-mCnc: 126 mg/dL
Hgb A1c MFr Bld: 6 % — ABNORMAL HIGH (ref 4.8–5.6)

## 2023-03-04 LAB — LIPID PANEL
Chol/HDL Ratio: 2.4 ratio (ref 0.0–5.0)
Cholesterol, Total: 80 mg/dL — ABNORMAL LOW (ref 100–199)
HDL: 34 mg/dL — ABNORMAL LOW (ref >39)
LDL Chol Calc (NIH): 26 mg/dL (ref 0–99)
Triglycerides: 104 mg/dL (ref 0–149)
VLDL Cholesterol Cal: 20 mg/dL (ref 5–40)

## 2023-03-06 DIAGNOSIS — G301 Alzheimer's disease with late onset: Secondary | ICD-10-CM | POA: Insufficient documentation

## 2023-03-06 DIAGNOSIS — F0280 Dementia in other diseases classified elsewhere without behavioral disturbance: Secondary | ICD-10-CM | POA: Insufficient documentation

## 2023-03-06 NOTE — Assessment & Plan Note (Signed)
Progressing.  Continue Namenda twice daily.

## 2023-03-09 LAB — SPECIMEN STATUS REPORT

## 2023-03-09 LAB — B12 AND FOLATE PANEL
Folate: 9.9 ng/mL (ref >3.0)
Vitamin B-12: 154 pg/mL — ABNORMAL LOW (ref 232–1245)

## 2023-03-10 ENCOUNTER — Other Ambulatory Visit: Payer: Self-pay

## 2023-03-10 MED ORDER — "LUER LOCK SAFETY SYRINGES 25G X 1"" 3 ML MISC": 1.0000 | 1 refills | Status: DC

## 2023-03-10 MED ORDER — CYANOCOBALAMIN 1000 MCG/ML IJ SOLN: 1000.0000 ug | INTRAMUSCULAR | 2 refills | Status: DC

## 2023-04-06 ENCOUNTER — Ambulatory Visit: Payer: Medicare HMO | Admitting: Physician Assistant

## 2023-05-22 ENCOUNTER — Other Ambulatory Visit: Payer: Self-pay | Admitting: Family Medicine

## 2023-05-22 DIAGNOSIS — R413 Other amnesia: Secondary | ICD-10-CM

## 2023-05-23 ENCOUNTER — Other Ambulatory Visit: Payer: Self-pay | Admitting: Family Medicine

## 2023-05-26 DIAGNOSIS — H353113 Nonexudative age-related macular degeneration, right eye, advanced atrophic without subfoveal involvement: Secondary | ICD-10-CM | POA: Diagnosis not present

## 2023-06-16 ENCOUNTER — Telehealth: Payer: Self-pay

## 2023-06-16 NOTE — Telephone Encounter (Signed)
 Copied from CRM [id number]. Topic: Clinical - Prescription Issue >> Jun 16, 2023  8:31 AM Nestora PARAS wrote: Reason for CRM: Pt's wife states there was no prescription sent over for  SYRINGE-NEEDLE, DISP, 3 ML, she states he got the medicine but not the needles and is requesting a callback

## 2023-06-16 NOTE — Telephone Encounter (Signed)
 Wife made aware that patient should be only taking injection monthly, stated that she gave him the last one last Monday and I made her aware that he want need another until after he see doctor cox on 07/04/23, and when his labs come back Dr. Sherre will let her know if he needs to continue shots monthly.

## 2023-07-03 NOTE — Progress Notes (Unsigned)
Subjective:  Patient ID: Jeffery Turner, male    DOB: Feb 09, 1941  Age: 83 y.o. MRN: [id number]  Chief Complaint  Patient presents with   Medical Management of Chronic Issues    HPI The patient, with a history of heart disease, hypothyroidism, prediabetes, and Alzheimer's disease, presents for a routine checkup. He reports no new symptoms or concerns. He has been adhering to his medication regimen, which includes levothyroxine, Repatha injections every two weeks, and nitroglycerin as needed. He admits to not taking his daily baby aspirin, but agrees to start. He is unsure if he is still taking metformin for prediabetes. He reports loose stools, but denies diarrhea. He has to clean himself after each bowel movement, which he finds bothersome.  The patient's wife reports that his memory seems to be worsening, which the patient agrees with. He has been diagnosed with Alzheimer's disease and is on two medications for it. The patient also reports that he works puzzles to keep his mind sharp. He is not driving due to his memory issues.  His wife reports giving him  B12 injections every two weeks, which has improved his energy levels. He denies any upper respiratory symptoms, chest pain, abdominal pain, joint pain, or muscle pain.  Alzheimer's dementia: Patient is taking Namenda 10 mg twice daily.  Aricept 10 mg before bed was held, but still in his list and it appears he is taking it.     Hypothyroidism: He takes Levothyroxine 25 mcg daily.   CORONARY ARTERY DISEASE: Repatha, NTG. Aspirin. No significant chest pain or breathing issues.  Sees cardiology.  Prediabetic: Last A1c was 6.2%. Taking Metformin 500 mg daily.  Paroxysmal Atrial fibrillation: not currently on medicines.     03/03/2023    8:37 AM 02/03/2023    2:09 PM 10/25/2022    9:20 AM 09/28/2022    3:28 PM 06/22/2022   10:17 AM  Depression screen PHQ 2/9  Decreased Interest 0 0 0 0 0  Down, Depressed, Hopeless 0 0 0 0 0  PHQ - 2 Score  0 0 0 0 0        07/04/2023    8:48 AM  Fall Risk   Falls in the past year? 0  Number falls in past yr: 0  Injury with Fall? 0  Risk for fall due to : No Fall Risks  Follow up Falls evaluation completed    Patient Care Team: Blane Ohara, MD as PCP - General (Family Medicine) Baldo Daub, MD as PCP - Cardiology (Cardiology)   Review of Systems  Constitutional:  Negative for chills, diaphoresis, fatigue and fever.  HENT:  Negative for congestion, ear pain and sore throat.   Respiratory:  Negative for cough and shortness of breath.   Cardiovascular:  Negative for chest pain and leg swelling.  Gastrointestinal:  Negative for abdominal pain, constipation, diarrhea, nausea and vomiting.  Genitourinary:  Negative for dysuria and urgency.  Musculoskeletal:  Negative for arthralgias and myalgias.  Neurological:  Negative for dizziness and headaches.  Psychiatric/Behavioral:  Negative for dysphoric mood.     Current Outpatient Medications on File Prior to Visit  Medication Sig Dispense Refill   aspirin EC 81 MG tablet Take 1 tablet (81 mg total) by mouth daily. Swallow whole. 90 tablet 3   cyanocobalamin (VITAMIN B12) 1000 MCG/ML injection INJECT 1 ML (1000 MCG TOTAL) INTO THE MUSCLE ONCE A WEEK. 12 mL 3   donepezil (ARICEPT) 10 MG tablet TAKE 1 TABLET AT BEDTIME 90 tablet  3   memantine (NAMENDA) 10 MG tablet TAKE 1 TABLET TWICE DAILY 180 tablet 1   nitroGLYCERIN (NITROSTAT) 0.4 MG SL tablet Place 1 tablet (0.4 mg total) under the tongue every 5 (five) minutes as needed. If require 2, recommend call 911. 50 tablet 0   pantoprazole (PROTONIX) 40 MG tablet TAKE 1 TABLET EVERY DAY 90 tablet 3   REPATHA 140 MG/ML SOSY INJECT 140MG  UNDER THE SKIN EVERY 2 WEEKS 6 mL 3   SYRINGE-NEEDLE, DISP, 3 ML (LUER LOCK SAFETY SYRINGES) 25G X 1" 3 ML MISC 1 each by Does not apply route once a week. 50 each 1   No current facility-administered medications on file prior to visit.   Past Medical  History:  Diagnosis Date   Acoustic neuroma    Anemia    Angina pectoris 02/06/2018   Apnea 08/11/2021   Bilateral leg weakness 04/04/2021   Constant exophthalmos    right   Coronary artery disease of native artery of native heart with stable angina pectoris 07/21/2010   s/p stent to CFX 1997 2/2 MI s/p DES to mRCA and DES mLAD 07/14/10 2/2 [place] cath 07/14/10: dLAD 60%; CFX stent ok; OM1 30-40%; pRCA 30%; PDA 40%; EF 60%   Daytime somnolence 08/11/2021   Epistaxis 07/21/2010   Mild cognitive impairment of uncertain or unknown etiology 07/27/2022   Mixed hyperlipidemia 07/21/2010   Muscle cramp 01/18/2022   Old myocardial infarction 07/21/2010   Osteoarthritis 07/21/2010   PAD (peripheral artery disease) 08/11/2021   PMR (polymyalgia rheumatica) 04/03/2021   Prediabetes 08/11/2021   S/P CABG x 3 02/05/2020   Secondary hypothyroidism 02/13/2021   Sensorineural hearing loss, bilateral    Shortness of breath 07/21/2010   Statin myopathy 04/04/2021   Thyroid disease    Transient visual loss, right    wears glasses   Ulnar neuropathy of both upper extremities 04/20/2022   Upper respiratory tract infection due to COVID-19 virus 05/14/2021   Past Surgical History:  Procedure Laterality Date   CARDIAC CATHETERIZATION     CORONARY ARTERY BYPASS GRAFT N/A 02/05/2020   Procedure: CORONARY ARTERY BYPASS GRAFTING (CABG), ON PUMP, TIMES THREE, USING LEFT INTERNAL MAMMARY ARTERY TO LEFT ANTERIOR DESCENDING ARTERY, REVERSE SAPHENOUS VEIN GRAFT TO POSTERIOR DESCENDING ARTERY AND CIRCUMFLEX ARTERY;  Surgeon: Delight Ovens, MD;  Location: [facility] OR;  Service: Open Heart Surgery;  Laterality: N/A;   ENDOVEIN HARVEST OF GREATER SAPHENOUS VEIN Right 02/05/2020   Procedure: ENDOVEIN HARVEST OF GREATER SAPHENOUS VEIN;  Surgeon: Delight Ovens, MD;  Location[facility];  Service: Open Heart Surgery;  Laterality: Right;   LEFT HEART CATH AND CORONARY ANGIOGRAPHY N/A 01/25/2020   Procedure: LEFT HEART CATH  AND CORONARY ANGIOGRAPHY;  Surgeon: Marykay Lex, MD;  Location: [facility] INVASIVE CV LAB;  Service: Cardiovascular;  Laterality: N/A;   TEE WITHOUT CARDIOVERSION N/A 02/05/2020   Procedure: TRANSESOPHAGEAL ECHOCARDIOGRAM (TEE);  Surgeon: Delight Ovens, MD;  Location[facility];  Service: Open Heart Surgery;  Laterality: N/A;   TONSILLECTOMY AND ADENOIDECTOMY     WISDOM TOOTH EXTRACTION      Family History  Problem Relation Age of Onset   Heart failure Mother    Alzheimer's disease Mother    Dementia Mother    Dementia Sister    Alzheimer's disease Sister    Memory loss Brother    Coronary artery disease Other    Social History   Socioeconomic History   Marital status: Married    Spouse name: Not on  file   Number of children: 2   Years of education: 14   Highest education level: Some college, no degree  Occupational History   Occupation: Retired    Comment: Holiday representative  Tobacco Use   Smoking status: Former    Current packs/day: 0.00    Average packs/day: 1.5 packs/day for 37.0 years (55.5 ttl pk-yrs)    Types: Cigarettes    Start date: 05/1963    Quit date: 05/2000    Years since quitting: 23.1   Smokeless tobacco: Never  Vaping Use   Vaping status: Never Used  Substance and Sexual Activity   Alcohol use: No   Drug use: No   Sexual activity: Not Currently  Other Topics Concern   Not on file  Social History Narrative   He lives in [place] with family.  He is a retired  Museum/gallery exhibitions officer.  He quit tobacco in 1997 with approximately 30-pack-  year history and denies alcohol or drug abuse.   Right handed   Drinks caffeine   Two story home      Social Drivers of Health   Financial Resource Strain: Low Risk  (02/03/2023)   Overall Financial Resource Strain (CARDIA)    Difficulty of Paying Living Expenses: Not hard at all  Food Insecurity: No Food Insecurity (02/03/2023)   Hunger Vital Sign    Worried About Running Out of Food in the Last Year: Never true    Ran Out of  Food in the Last Year: Never true  Transportation Needs: No Transportation Needs (02/03/2023)   PRAPARE - Administrator, Civil Service (Medical): No    Lack of Transportation (Non-Medical): No  Physical Activity: Inactive (02/03/2023)   Exercise Vital Sign    Days of Exercise per Week: 0 days    Minutes of Exercise per Session: 0 min  Stress: No Stress Concern Present (02/03/2023)   [employer] of Occupational Health - Occupational Stress Questionnaire    Feeling of Stress : Not at all  Social Connections: Moderately Isolated (02/03/2023)   Social Connection and Isolation Panel [NHANES]    Frequency of Communication with Friends and Family: More than three times a week    Frequency of Social Gatherings with Friends and Family: More than three times a week    Attends Religious Services: Never    Database administrator or Organizations: No    Attends Engineer, structural: Never    Marital Status: Married    Objective:  BP 122/72   Pulse 76   Temp 97.6 F (36.4 C)   Ht 5\' 7"  (1.702 m)   Wt 207 lb (93.9 kg)   SpO2 96%   BMI 32.42 kg/m      07/04/2023    8:45 AM 03/03/2023    8:34 AM 02/03/2023    2:03 PM  BP/Weight  Systolic BP 122 108 --  Diastolic BP 72 68 --  Wt. (Lbs) 207 201.8 212  BMI 32.42 kg/m2 31.61 kg/m2 33.2 kg/m2    Physical Exam Vitals reviewed.  Constitutional:      Appearance: Normal appearance. He is normal weight.  Cardiovascular:     Rate and Rhythm: Normal rate and regular rhythm.     Heart sounds: No murmur heard. Pulmonary:     Effort: Pulmonary effort is normal.     Breath sounds: Normal breath sounds.  Abdominal:     General: Abdomen is flat. Bowel sounds are normal.     Palpations: Abdomen is soft.  Tenderness: There is no abdominal tenderness.  Neurological:     Mental Status: He is alert and oriented to person, place, and time.  Psychiatric:        Mood and Affect: Mood normal.        Behavior: Behavior  normal.     Diabetic Foot Exam - Simple   No data filed      Lab Results  Component Value Date   WBC 5.6 03/03/2023   HGB 11.9 (L) 03/03/2023   HCT 36.8 (L) 03/03/2023   PLT 291 03/03/2023   GLUCOSE 85 03/03/2023   CHOL 80 (L) 03/03/2023   TRIG 104 03/03/2023   HDL 34 (L) 03/03/2023   LDLDIRECT 152.5 10/05/2010   LDLCALC 26 03/03/2023   ALT 12 03/03/2023   AST 14 03/03/2023   NA 136 03/03/2023   K 5.2 03/03/2023   CL 99 03/03/2023   CREATININE 1.04 03/03/2023   BUN 15 03/03/2023   CO2 20 03/03/2023   TSH 1.280 10/25/2022   INR 1.4 (H) 02/05/2020   HGBA1C 6.0 (H) 03/03/2023      Assessment & Plan:    Mixed hyperlipidemia Assessment & Plan: Well controlled.  Continue with Repatha. Continue to work on eating a healthy diet and exercise.  Labs drawn today.    Orders: -     Lipid panel  Prediabetes Assessment & Plan: Recommend continue to work on eating healthy diet and exercise. Hold metformin to see if stools improve   Orders: -     Hemoglobin A1c  Coronary artery disease of native artery of native heart with stable angina pectoris [facility]) Assessment & Plan: Management per specialist. -Resume daily baby aspirin and include in pill box to improve adherence.    Hypertensive heart disease without heart failure Assessment & Plan: Well controlled.  No changes to medicines. : NTG. Aspirin Continue to work on eating a healthy diet and exercise.  Labs drawn today.    Orders: -     CBC with Differential/Platelet -     Comprehensive metabolic panel  B12 deficiency Assessment & Plan: Currently receiving weekly B12 injections with reported improvement in energy. -Check B12 level. If within normal range, decrease B12 injections to once a month.   Orders: -     Vitamin B12  Dementia due to Alzheimer's disease [facility]) Assessment & Plan: Progressive memory loss.  -Continue current Alzheimer's medications. Management per specialist.     Statin  myopathy Assessment & Plan: Intolerant to statins    Secondary hypothyroidism   -Recommend Shingles vaccine. Patient to obtain at pharmacy due to Medicare coverage.   No orders of the defined types were placed in this encounter.   Orders Placed This Encounter  Procedures   CBC with Differential/Platelet   Comprehensive metabolic panel   Hemoglobin A1c   Lipid panel   Vitamin B12     Follow-up: Return in about 4 months (around 11/01/2023) for chronic follow up.   I,Marla I Leal-Borjas,acting as a scribe for Blane Ohara, MD.,have documented all relevant documentation on the behalf of Blane Ohara, MD,as directed by  Blane Ohara, MD while in the presence of Blane Ohara, MD.   An After Visit Summary was printed and given to the patient.  I attest that I have reviewed this visit and agree with the plan scribed by my staff.   Blane Ohara, MD [facility] [phone]

## 2023-07-04 ENCOUNTER — Other Ambulatory Visit: Payer: Self-pay | Admitting: Family Medicine

## 2023-07-04 ENCOUNTER — Ambulatory Visit (INDEPENDENT_AMBULATORY_CARE_PROVIDER_SITE_OTHER): Payer: Medicare HMO | Admitting: Family Medicine

## 2023-07-04 ENCOUNTER — Encounter: Payer: Self-pay | Admitting: Family Medicine

## 2023-07-04 VITALS — BP 122/72 | HR 76 | Temp 97.6°F | Ht 67.0 in | Wt 207.0 lb

## 2023-07-04 DIAGNOSIS — E782 Mixed hyperlipidemia: Secondary | ICD-10-CM | POA: Diagnosis not present

## 2023-07-04 DIAGNOSIS — I25118 Atherosclerotic heart disease of native coronary artery with other forms of angina pectoris: Secondary | ICD-10-CM | POA: Diagnosis not present

## 2023-07-04 DIAGNOSIS — G301 Alzheimer's disease with late onset: Secondary | ICD-10-CM

## 2023-07-04 DIAGNOSIS — E538 Deficiency of other specified B group vitamins: Secondary | ICD-10-CM | POA: Diagnosis not present

## 2023-07-04 DIAGNOSIS — G309 Alzheimer's disease, unspecified: Secondary | ICD-10-CM

## 2023-07-04 DIAGNOSIS — I480 Paroxysmal atrial fibrillation: Secondary | ICD-10-CM

## 2023-07-04 DIAGNOSIS — F0280 Dementia in other diseases classified elsewhere without behavioral disturbance: Secondary | ICD-10-CM

## 2023-07-04 DIAGNOSIS — G720 Drug-induced myopathy: Secondary | ICD-10-CM | POA: Diagnosis not present

## 2023-07-04 DIAGNOSIS — R7303 Prediabetes: Secondary | ICD-10-CM

## 2023-07-04 DIAGNOSIS — T466X5A Adverse effect of antihyperlipidemic and antiarteriosclerotic drugs, initial encounter: Secondary | ICD-10-CM

## 2023-07-04 DIAGNOSIS — I119 Hypertensive heart disease without heart failure: Secondary | ICD-10-CM

## 2023-07-04 DIAGNOSIS — E038 Other specified hypothyroidism: Secondary | ICD-10-CM

## 2023-07-04 HISTORY — DX: Deficiency of other specified B group vitamins: E53.8

## 2023-07-04 NOTE — Assessment & Plan Note (Signed)
Well controlled.  Continue with Repatha. Continue to work on eating a healthy diet and exercise.  Labs drawn today.   

## 2023-07-04 NOTE — Assessment & Plan Note (Signed)
Currently receiving weekly B12 injections with reported improvement in energy. -Check B12 level. If within normal range, decrease B12 injections to once a month.

## 2023-07-04 NOTE — Assessment & Plan Note (Addendum)
Recommend continue to work on eating healthy diet and exercise. Hold metformin to see if stools improve

## 2023-07-04 NOTE — Assessment & Plan Note (Signed)
Intolerant to statins. 

## 2023-07-04 NOTE — Assessment & Plan Note (Signed)
Management per specialist. -Resume daily baby aspirin and include in pill box to improve adherence.

## 2023-07-04 NOTE — Assessment & Plan Note (Signed)
Well controlled.  No changes to medicines. : NTG. Aspirin Continue to work on eating a healthy diet and exercise.  Labs drawn today.

## 2023-07-04 NOTE — Assessment & Plan Note (Signed)
Progressive memory loss.  -Continue current Alzheimer's medications. Management per specialist.

## 2023-07-04 NOTE — Patient Instructions (Addendum)
VISIT SUMMARY:  During your routine checkup, we reviewed your current health status and medication regimen. You reported no new symptoms, but we discussed some concerns about your memory and bowel movements. We also talked about your adherence to your medications and made some adjustments to your treatment plan.  YOUR PLAN:  -ALZHEIMER'S DISEASE: Alzheimer's disease is a progressive condition that affects memory and other important mental functions. You are currently on two medications for this condition, and we will continue with the same medications.  -PREDIABETES: Prediabetes is a condition where blood sugar levels are higher than normal but not high enough to be classified as diabetes. We suspect that your loose stools may be related to Metformin, so we will discontinue this medication and monitor your bowel movements for improvement.  -HYPOTHYROIDISM: Hypothyroidism is a condition where the thyroid gland does not produce enough thyroid hormone. You are currently taking Levothyroxine, and we will continue this medication as prescribed.  -CARDIOVASCULAR DISEASE: Cardiovascular disease refers to conditions affecting the heart and blood vessels. You are on Repatha, nitroglycerin, and aspirin. Please continue taking Repatha and nitroglycerin as prescribed. You should also resume taking your daily baby aspirin and include it in your pill box to help you remember.  -VITAMIN B12 DEFICIENCY: Vitamin B12 deficiency can cause fatigue and other symptoms. You have been receiving B12 injections every two weeks, which have improved your energy levels. We will check your B12 level, and if it is within the normal range, we will reduce the injections to once a month.  -GENERAL HEALTH MAINTENANCE: For your overall health, we recommend getting the Shingles vaccine, which you can obtain at the pharmacy. We will also order routine blood work to monitor your health.  INSTRUCTIONS:  Please resume taking your daily  baby aspirin and include it in your pill box. Discontinue Metformin and monitor your bowel movements for any changes. Get the Shingles vaccine at your pharmacy. We will check your B12 level, and if it is normal, we will reduce your B12 injections to once a month. Routine blood work has been ordered.

## 2023-07-05 LAB — CBC WITH DIFFERENTIAL/PLATELET
Basophils Absolute: 0 10*3/uL (ref 0.0–0.2)
Basos: 1 %
EOS (ABSOLUTE): 0.1 10*3/uL (ref 0.0–0.4)
Eos: 1 %
Hematocrit: 41.7 % (ref 37.5–51.0)
Hemoglobin: 13.6 g/dL (ref 13.0–17.7)
Immature Grans (Abs): 0 10*3/uL (ref 0.0–0.1)
Immature Granulocytes: 0 %
Lymphocytes Absolute: 1.1 10*3/uL (ref 0.7–3.1)
Lymphs: 23 %
MCH: 27.4 pg (ref 26.6–33.0)
MCHC: 32.6 g/dL (ref 31.5–35.7)
MCV: 84 fL (ref 79–97)
Monocytes Absolute: 0.8 10*3/uL (ref 0.1–0.9)
Monocytes: 16 %
Neutrophils Absolute: 2.9 10*3/uL (ref 1.4–7.0)
Neutrophils: 59 %
Platelets: 274 10*3/uL (ref 150–450)
RBC: 4.97 x10E6/uL (ref 4.14–5.80)
RDW: 14.9 % (ref 11.6–15.4)
WBC: 5 10*3/uL (ref 3.4–10.8)

## 2023-07-05 LAB — COMPREHENSIVE METABOLIC PANEL
ALT: 12 [IU]/L (ref 0–44)
AST: 14 [IU]/L (ref 0–40)
Albumin: 4.2 g/dL (ref 3.7–4.7)
Alkaline Phosphatase: 61 [IU]/L (ref 44–121)
BUN/Creatinine Ratio: 18 (ref 10–24)
BUN: 18 mg/dL (ref 8–27)
Bilirubin Total: 0.4 mg/dL (ref 0.0–1.2)
CO2: 23 mmol/L (ref 20–29)
Calcium: 9.1 mg/dL (ref 8.6–10.2)
Chloride: 100 mmol/L (ref 96–106)
Creatinine, Ser: 1.01 mg/dL (ref 0.76–1.27)
Globulin, Total: 2.3 g/dL (ref 1.5–4.5)
Glucose: 91 mg/dL (ref 70–99)
Potassium: 5 mmol/L (ref 3.5–5.2)
Sodium: 136 mmol/L (ref 134–144)
Total Protein: 6.5 g/dL (ref 6.0–8.5)
eGFR: 74 mL/min/{1.73_m2} (ref >59)

## 2023-07-05 LAB — LIPID PANEL
Chol/HDL Ratio: 2.6 {ratio} (ref 0.0–5.0)
Cholesterol, Total: 92 mg/dL — ABNORMAL LOW (ref 100–199)
HDL: 35 mg/dL — ABNORMAL LOW (ref >39)
LDL Chol Calc (NIH): 42 mg/dL (ref 0–99)
Triglycerides: 68 mg/dL (ref 0–149)
VLDL Cholesterol Cal: 15 mg/dL (ref 5–40)

## 2023-07-05 LAB — HEMOGLOBIN A1C
Est. average glucose Bld gHb Est-mCnc: 140 mg/dL
Hgb A1c MFr Bld: 6.5 % — ABNORMAL HIGH (ref 4.8–5.6)

## 2023-07-05 LAB — VITAMIN B12: Vitamin B-12: 553 pg/mL (ref 232–1245)

## 2023-07-06 ENCOUNTER — Other Ambulatory Visit: Payer: Self-pay

## 2023-07-06 MED ORDER — CYANOCOBALAMIN 1000 MCG/ML IJ SOLN: 1000.0000 ug | INTRAMUSCULAR | Status: DC

## 2023-08-01 ENCOUNTER — Encounter: Payer: Self-pay | Admitting: Psychology

## 2023-08-02 ENCOUNTER — Encounter: Payer: Self-pay | Admitting: Psychology

## 2023-08-02 ENCOUNTER — Ambulatory Visit: Payer: Medicare Other | Admitting: Psychology

## 2023-08-02 DIAGNOSIS — G3184 Mild cognitive impairment, so stated: Secondary | ICD-10-CM | POA: Diagnosis not present

## 2023-08-02 DIAGNOSIS — R4189 Other symptoms and signs involving cognitive functions and awareness: Secondary | ICD-10-CM

## 2023-08-02 NOTE — Progress Notes (Signed)
   Psychometrician Note   Cognitive testing was administered to Jeffery Turner by Wallace Keller, B.S. (psychometrist) under the supervision of Dr. Newman Nickels, Ph.D., ABPP, licensed psychologist on 08/02/2023. Mr. Carducci did not appear overtly distressed by the testing session per behavioral observation or responses across self-report questionnaires. Rest breaks were offered.    The battery of tests administered was selected by Dr. Newman Nickels, Ph.D., ABPP with consideration to Jeffery Turner current level of functioning, the nature of his symptoms, emotional and behavioral responses during interview, level of literacy, observed level of motivation/effort, and the nature of the referral question. This battery was communicated to the psychometrist. Communication between Dr. Newman Nickels, Ph.D., ABPP and the psychometrist was ongoing throughout the evaluation and Dr. Newman Nickels, Ph.D., ABPP was immediately accessible at all times. Dr. Newman Nickels, Ph.D., ABPP provided supervision to the psychometrist on the date of this service to the extent necessary to assure the quality of all services provided.    Jeffery Turner will return within approximately 1-2 weeks for an interactive feedback session with Dr. Milbert Coulter at which time his test performances, clinical impressions, and treatment recommendations will be reviewed in detail. Jeffery Turner understands he can contact our office should he require our assistance before this time.  A total of 140 minutes of billable time were spent face-to-face with Jeffery Turner by the psychometrist. This includes both test administration and scoring time. Billing for these services is reflected in the clinical report generated by Dr. Newman Nickels, Ph.D., ABPP  This note reflects time spent with the psychometrician and does not include test scores or any clinical interpretations made by Dr. Milbert Coulter. The full report will follow in a separate note.

## 2023-08-02 NOTE — Progress Notes (Addendum)
 NEUROPSYCHOLOGICAL EVALUATION [place]  Department of Neurology  Date of Evaluation: August 02, 2023  Reason for Referral:   Jeffery Turner is a 83 y.o. right-handed Caucasian male referred by Marlowe Kays, PA-C, to characterize his current cognitive functioning and assist with diagnostic clarity and treatment planning in the context of a prior mild neurocognitive disorder due to unclear etiology and concern for progressive cognitive decline.   Assessment and Plan:   Clinical Impression(s): Mr. Jeffery Turner pattern of performance is suggestive of variability but an overall weakness learning and later retrieving verbal information across memory tasks. Performances were appropriate relative to age-matched peers across all other assessed cognitive domains. This includes processing speed, attention/concentration, executive functioning, receptive and expressive language, visuospatial abilities, and recognition/consolidation aspects of memory. His wife is involved in some functional endeavors and they reported that his PCP advised him against driving. Despite functional concerns, testing patterns in no way reflect underlying dementia. He continues to best meet diagnostic criteria for a Mild Neurocognitive Disorder ("mild cognitive impairment") at the present time.  Relative to his previous evaluation in February 2024, subtle decline was exhibited across learning trials of verbal memory tasks. However, very subtle improvements were also noted across story and figure based memory tasks surrounding delayed retrieval. Subtle improvements could also be argued across semantic fluency and certain visuospatial aspects. Outside of this, Mr. Jeffery Turner exhibited a very large degree of stability over time.   While a diagnosis of Alzheimer's disease was placed in his medical records prior to his previous evaluation with me by an unknown clinician, I openly questioned this diagnosis a year ago  and, given the benefit of repeat testing, more strongly question it now. Memory weaknesses largely surround trouble efficiently learning novel information and there is no compelling evidence for rapid forgetting or a pronounced storage impairment. Underlying symptomatic Alzheimer's disease appears unlikely based upon current testing.  Both [name] and his wife have remained consistent in stating that memory dysfunction was observed following his CABG surgical procedure in or around 2022. Memory loss is not uncommon after this type of procedure. In most cases, it is temporary and resolves itself over time. However, medically, Mr. Jeffery Turner has several chronic ailments which can negatively impact memory and cognitive functioning (e.g., coronary artery disease, a past myocardial infarction, hypertension, hyperlipidemia, prediabetes, hypothyroidism, vitamin B12 deficiency). Neurologically, his past brain MRI also revealed mild microvascular ischemic disease. Trouble with inefficient learning across memory tasks can certainly be caused by chronic vascular/medical ailments, as well as ongoing hearing loss. Given strength and stability across the majority of his evaluation, this combination of factors would appear to be the most likely culprit at the present time. Continued monitoring will be beneficial moving forward.   Recommendations: Performance across neurocognitive testing is not a strong predictor of an individual's safety operating a motor vehicle. With that being said, there are no obvious cognitive contraindications to driving at the present time. Should he or his family wish to pursue a formalized driving evaluation, they could reach out to the following agencies: [employer] in [place]: [phone] [facility]: [phone] [facility]: [phone] [facility]: [phone] or [phone]  Should there be progression of current deficits over time, Mr.  Jeffery Turner is unlikely to regain any independent living skills lost. Therefore, it is recommended that he remain as involved as possible in all aspects of household chores, finances, and medication management, with supervision to ensure adequate performance. He will likely benefit from the  establishment and maintenance of a routine in order to maximize his functional abilities over time.  It will be important for Mr. Jeffery Turner to have another person with him when in situations where he may need to process information, weigh the pros and cons of different options, and make decisions, in order to ensure that he fully understands and recalls all information to be considered.  If not already done, Mr. Jeffery Turner and his family may want to discuss his wishes regarding durable power of attorney and medical decision making, so that he can have input into these choices. If they require legal assistance with this, long-term care resource access, or other aspects of estate planning, they could reach out to [employer] at [phone] for a free consultation.   Mr. Jeffery Turner is encouraged to attend to lifestyle factors for brain health (e.g., regular physical exercise, good nutrition habits and consideration of the MIND-DASH diet, regular participation in cognitively-stimulating activities, and general stress management techniques), which are likely to have benefits for both emotional adjustment and cognition. Optimal control of vascular risk factors (including safe cardiovascular exercise and adherence to dietary recommendations) is encouraged. Continued participation in activities which provide mental stimulation and social interaction is also recommended.   Memory can be improved using internal strategies such as rehearsal, repetition, chunking, mnemonics, association, and imagery. External strategies such as written notes in a consistently used memory journal, visual and nonverbal auditory cues such as a calendar on the  refrigerator or appointments with alarm, such as on a cell phone, can also help maximize recall.    When learning new information, he would benefit from information being broken up into small, manageable pieces. He may find it helpful to articulate the material in his own words and in a context to promote encoding at the onset of a new task. This material may need to be repeated multiple times to promote encoding. He also may understand and retain new information better if it is presented to him in a meaningful or well-organized manner at the outset, such as grouping items into meaningful categories or presenting information in an outlined, bulleted, or story format.  To address problems with fluctuating attention and/or executive dysfunction, he may wish to consider:   -Avoiding external distractions when needing to concentrate   -Limiting exposure to fast paced environments with multiple sensory demands   -Writing down complicated information and using checklists   -Attempting and completing one task at a time (i.e., no multi-tasking)   -Verbalizing aloud each step of a task to maintain focus   -Taking frequent breaks during the completion of steps/tasks to avoid fatigue   -Reducing the amount of information considered at one time   -Scheduling more difficult activities for a time of day where he is usually most alert  Review of Records:   Mr. Mall was seen by [facility] Marlowe Kays, PA-C) on 03/31/2022 for an evaluation of memory loss. At that time, memory concerns were said to be present for the past two years following his CABG procedure. He described generalized forgetfulness while his wife reported some trouble using tools and appliances. She also noted that he seemed more repetitive in conversation and has greater trouble with directions while driving. Performance on a brief cognitive screening instrument (MOCA) was 25/30. Ultimately, Mr. Lal was referred for a comprehensive  neuropsychological evaluation to characterize his cognitive abilities and to assist with diagnostic clarity and treatment planning.   He completed a neuropsychological evaluation with myself on 07/27/2022. Results  suggested performance variability but overall weakness surrounding delayed retrieval of novel information. Additional variability was exhibited encoding (i.e., learning) novel verbal information. Performances were appropriate relative to age-matched peers across processing speed, attention/concentration, executive functioning, safety/judgment, receptive and expressive language, and visuospatial abilities. Testing patterns were not strongly suggestive for Alzheimer's disease; however, a very early presentation could not be ruled out. Difficulties may also be related to his CABG surgical procedure and other medical/vascular factors. Repeat testing was recommended.  Ultimately, Mr. Witz was referred for a comprehensive neuropsychological evaluation to characterize his cognitive abilities and to assist with diagnostic clarity and treatment planning.   Neuroimaging: Brain MRI on 08/24/2021 revealed mild parenchymal volume loss, as well as mild chronic microvascular ischemic changes.  Past Medical History:  Diagnosis Date   Acoustic neuroma    Anemia    Angina pectoris 02/06/2018   Apnea 08/11/2021   Bilateral leg weakness 04/04/2021   Constant exophthalmos    right   Coronary artery disease of native artery of native heart with stable angina pectoris 07/21/2010   s/p stent to CFX 1997 2/2 MI s/p DES to mRCA and DES mLAD 07/14/10 2/2 [place] cath 07/14/10: dLAD 60%; CFX stent ok; OM1 30-40%; pRCA 30%; PDA 40%; EF 60%   Daytime somnolence 08/11/2021   Epistaxis 07/21/2010   Hypertensive heart disease without heart failure 08/11/2021   Mild cognitive impairment of uncertain or unknown etiology 07/27/2022   Mixed hyperlipidemia 07/21/2010   Muscle cramp 01/18/2022   Old myocardial infarction  07/21/2010   Osteoarthritis 07/21/2010   PAD (peripheral artery disease) 08/11/2021   PMR (polymyalgia rheumatica) 04/03/2021   Prediabetes 08/11/2021   S/P CABG x 3 02/05/2020   Secondary hypothyroidism 02/13/2021   Sensorineural hearing loss, bilateral    Shortness of breath 07/21/2010   Statin myopathy 04/04/2021   Transient visual loss, right    wears glasses   Ulnar neuropathy of both upper extremities 04/20/2022   Upper respiratory tract infection due to COVID-19 virus 05/14/2021   Vitamin B12 deficiency 07/04/2023    Past Surgical History:  Procedure Laterality Date   CARDIAC CATHETERIZATION     CORONARY ARTERY BYPASS GRAFT N/A 02/05/2020   Procedure: CORONARY ARTERY BYPASS GRAFTING (CABG), ON PUMP, TIMES THREE, USING LEFT INTERNAL MAMMARY ARTERY TO LEFT ANTERIOR DESCENDING ARTERY, REVERSE SAPHENOUS VEIN GRAFT TO POSTERIOR DESCENDING ARTERY AND CIRCUMFLEX ARTERY;  Surgeon: Delight Ovens, MD;  Location: [facility] OR;  Service: Open Heart Surgery;  Laterality: N/A;   ENDOVEIN HARVEST OF GREATER SAPHENOUS VEIN Right 02/05/2020   Procedure: ENDOVEIN HARVEST OF GREATER SAPHENOUS VEIN;  Surgeon: Delight Ovens, MD;  Location: [facility] OR;  Service: Open Heart Surgery;  Laterality: Right;   LEFT HEART CATH AND CORONARY ANGIOGRAPHY N/A 01/25/2020   Procedure: LEFT HEART CATH AND CORONARY ANGIOGRAPHY;  Surgeon: Marykay Lex, MD;  Location: [facility] INVASIVE CV LAB;  Service: Cardiovascular;  Laterality: N/A;   TEE WITHOUT CARDIOVERSION N/A 02/05/2020   Procedure: TRANSESOPHAGEAL ECHOCARDIOGRAM (TEE);  Surgeon: Delight Ovens, MD;  Location: [facility] OR;  Service: Open Heart Surgery;  Laterality: N/A;   TONSILLECTOMY AND ADENOIDECTOMY     WISDOM TOOTH EXTRACTION      Current Outpatient Medications:    aspirin EC 81 MG tablet, Take 1 tablet (81 mg total) by mouth daily. Swallow whole., Disp: 90 tablet, Rfl: 3   cyanocobalamin (VITAMIN B12) 1000 MCG/ML injection, Inject 1 mL (1,000 mcg total) into the  muscle every 30 (thirty) days., Disp: , Rfl:  donepezil (ARICEPT) 10 MG tablet, TAKE 1 TABLET AT BEDTIME, Disp: 90 tablet, Rfl: 3   levothyroxine (SYNTHROID) 25 MCG tablet, TAKE 1 TABLET EVERY DAY BEFORE BREAKFAST, Disp: 90 tablet, Rfl: 3   memantine (NAMENDA) 10 MG tablet, TAKE 1 TABLET TWICE DAILY, Disp: 180 tablet, Rfl: 1   metFORMIN (GLUCOPHAGE) 500 MG tablet, TAKE 1 TABLET EVERY DAY WITH BREAKFAST, Disp: 90 tablet, Rfl: 3   nitroGLYCERIN (NITROSTAT) 0.4 MG SL tablet, Place 1 tablet (0.4 mg total) under the tongue every 5 (five) minutes as needed. If require 2, recommend call 911., Disp: 50 tablet, Rfl: 0   pantoprazole (PROTONIX) 40 MG tablet, TAKE 1 TABLET EVERY DAY, Disp: 90 tablet, Rfl: 3   REPATHA 140 MG/ML SOSY, INJECT 140MG  UNDER THE SKIN EVERY 2 WEEKS, Disp: 6 mL, Rfl: 3   SYRINGE-NEEDLE, DISP, 3 ML (LUER LOCK SAFETY SYRINGES) 25G X 1" 3 ML MISC, 1 each by Does not apply route once a week., Disp: 50 each, Rfl: 1  Clinical Interview:   The following information was obtained during a clinical interview with Mr. Poirier prior to cognitive testing.  Cognitive Symptoms: Decreased short-term memory: Endorsed. He previously described generalized concerns. With direct questioning, he acknowledged some trouble recalling details of recent conversations and noted that he might be slow to recall names and other information (which often comes later). His wife previously described greater memory dysfunction, including Mr. Spivack being repetitive in conversation and having trouble remembering turns and other aspects of navigation while driving. Currently, Mr. Delcastillo described stability relative to his previous February 2024 evaluation. His wife noted some good and bad days but also largely denied concern for progressive cognitive decline. Decreased long-term memory: Denied. Decreased attention/concentration: Denied. Reduced processing speed: Denied. Difficulties with executive functions: Endorsed. They  previously reported a longstanding weakness surrounding organization and multi-tasking. This was said to be largely unchanged over time. They denied trouble with impulsivity. They also denied any significant personality changes outside of his wife noting that he seems more irritable and has a shorter fuse when faced with memory lapses. This was stable. Difficulties with emotion regulation: Denied. Difficulties with receptive language: Denied. Difficulties with word finding: Denied. Decreased visuoperceptual ability: Denied.   Trajectory of deficits: Mr. Mcpeek described memory dysfunction first being noticeable following his CABG procedure 3-3.5 years prior. His wife was in agreement that difficulties were first noticed around the time of this procedure.    Difficulties completing ADLs: Somewhat. His wife manages finances and bill paying which is longstanding in nature. His wife also provides assistance with medication management. When asked, this seemed to at least be partially due to convenience as his wife stated that she "just want[s] to make sure that he takes it." He was driving locally without significant issue but stopped at the more recent recommendation of his PCP. They were both unsure regarding this recommendation outside of his PCP reportedly expressing concern for him getting lost.  Additional Medical History: History of traumatic brain injury/concussion: Denied. History of stroke: Denied. History of seizure activity: Denied. History of known exposure to toxins: Denied. Symptoms of chronic pain: Denied. He previously reported ongoing pain in his feet and legs, generally experienced after he walks or tries to engage in some level of physical activity.  Experience of frequent headaches/migraines: Denied. Frequent instances of dizziness/vertigo: Denied.   Sensory changes: He wears reading glasses with benefit and is deaf in his right ear. Hearing aids were said to not always be helpful as  they may  magnify external, unintended sounds. He also reported a diminished sense of taste which has persisted since contracting COVID-19 in 2023.  Balance/coordination difficulties: Largely denied. He ambulates with a cane at times as a precaution as he will experience shortness of breath much more quickly since his CABG procedure. He denied any recent falls and one side of the body was not said to be weaker or less stable than the other.  Other motor difficulties: Denied. He did report occasional hand cramping.   Sleep History: Estimated hours obtained each night: 7-8 hours.  Difficulties falling asleep: Denied. Difficulties staying asleep: He reported some nights where he might wake often to use the restroom.  Feels rested and refreshed upon awakening: Endorsed.   History of snoring: Endorsed. History of waking up gasping for air: Denied. Witnessed breath cessation while asleep: Denied.   History of vivid dreaming: Endorsed. Excessive movement while asleep: Denied. His wife did report some talking behaviors from time to time. However, she denied any physical manifestations.  Instances of acting out his dreams: Denied.  Psychiatric/Behavioral Health History: Depression: He described his current mood as positive overall and denied to his knowledge any prior mental health concerns or diagnoses. His wife was in agreement, describing him as fairly "easy going" a majority of the time. Current or remote suicidal ideation, intent, or plan was denied.  Anxiety: Denied. Mania: Denied. Trauma History: Denied. Visual/auditory hallucinations: Denied. Delusional thoughts: Denied.   Tobacco: Denied. Alcohol: He denied current alcohol consumption as well as a history of problematic alcohol abuse or dependence.  Recreational drugs: Denied.  Family History: Problem Relation Age of Onset   Heart failure Mother    Alzheimer's disease Mother    Dementia Mother    Dementia Sister    Alzheimer's  disease Sister    Memory loss Brother    Coronary artery disease Other    This information was confirmed by Mr. Reiling.  Academic/Vocational History: Highest level of educational attainment: 14 years. He graduated from high school. While working and raising his family, he completed college courses while attending school at night. He ultimately earned enough credits to complete his Sophomore year. He described himself as a good (A/B) student in academic settings. No relative weaknesses were identified.  History of developmental delay: Denied. History of grade repetition: Denied. Enrollment in special education courses: Denied. History of LD/ADHD: Denied.   Employment: Retired. 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   Evaluation Results:   Behavioral Observations: Mr. Hudspeth was accompanied by his wife, arrived to his appointment on time, and was appropriately dressed and groomed. He appeared alert. Observed gait and station were within normal limits. Gross motor functioning appeared intact upon informal observation and no abnormal movements (e.g., tremors) were noted. His affect was generally relaxed and positive. Spontaneous speech was fluent and word finding difficulties were not observed during the clinical interview. Thought processes were coherent, organized, and normal in content. Insight into his cognitive difficulties appeared adequate.   During testing, sustained attention was appropriate. Task engagement was adequate and he persisted when challenged. Overall, Mr. Sailors was cooperative with the clinical interview and subsequent testing procedures.   Adequacy of Effort: The validity of neuropsychological testing is limited by the extent to which the individual being tested may be assumed to have exerted adequate effort during testing. Mr. Rabideau expressed his intention to perform to the best of his abilities and exhibited adequate task engagement and  persistence. Performance across a stand-alone performance validity  measure was within expectation. As such, the results of the current evaluation are believed to be a valid representation of Mr. Francom current cognitive functioning.  Test Results: Mr. Sohn was mildly disoriented at the time of the current evaluation. He was 5 days off when stating the current date and was unable to name the current clinic.  Intellectual abilities based upon educational and vocational attainment were estimated to be in the average range. Premorbid abilities were estimated to be within the average range based upon a single-word reading test.   Processing speed was average. Basic attention was average to above average. More complex attention (e.g., working memory) was also average to above average. Executive functioning was mildly variable but overall appropriate, ranging from the below average to above average normative ranges. He scored in the exceptionally high range across a task assessing safety and judgment.   Assessed receptive language abilities were above average. Likewise, Mr. Bartleson did not exhibit any difficulties comprehending task instructions and answered all questions asked of him appropriately. Assessed expressive language (e.g., verbal fluency and confrontation naming) was average to well above average.     Assessed visuospatial/visuoconstructional abilities were average.    Learning (i.e., encoding) of novel verbal information was exceptionally low to below average. Spontaneous delayed recall (i.e., retrieval) of previously learned information was exceptionally low across a list learning task but below average across story and figure tasks. Performance across recognition tasks was below average to average, suggesting evidence for information consolidation.   Results of emotional screening instruments suggested that recent symptoms of generalized anxiety were in the minimal to mild range, while symptoms  of depression were within normal limits. A screening instrument assessing recent sleep quality suggested the presence of minimal sleep dysfunction.  Tables of Scores:   Note: This summary of test scores accompanies the interpretive report and should not be considered in isolation without reference to the appropriate sections in the text. Descriptors are based on appropriate normative data and may be adjusted based on clinical judgment. Terms such as "Within Normal Limits" and "Outside Normal Limits" are used when a more specific description of the test score cannot be determined. Descriptors refer to the current evaluation only.        Percentile - Normative Descriptor > 98 - Exceptionally High 91-97 - Well Above Average 75-90 - Above Average 25-74 - Average 9-24 - Below Average 2-8 - Well Below Average < 2 - Exceptionally Low        Validity: February 2024 Current  DESCRIPTOR        DCT: --- --- --- Within Normal Limits        Orientation:       Raw Score Raw Score Percentile   NAB Orientation, Form 1 29/29 25/29 --- ---        Cognitive Screening:       Raw Score Raw Score Percentile   SLUMS: 20/30 24/30 --- ---        RBANS, Form A: Standard Score/ Scaled Score Standard Score/ Scaled Score Percentile   Total Score 84 94 34 Average  Immediate Memory 83 69 2 Well Below Average    List Learning 5 3 1  Exceptionally Low    Story Memory 9 6 9  Below Average  Visuospatial/Constructional 84 109 73 Average    Figure Copy 8 11 63 Average    Line Orientation 13/20 17/20 51-75 Average  Language 92 107 68 Average    Picture Naming 10/10 10/10 >75 Above Average  Semantic Fluency 6 11 63 Average  Attention 100 106 66 Average    Digit Span 10 13 84 Above Average    Coding 10 9 37 Average  Delayed Memory 85 90 25 Average    List Recall 0/10 0/10 <2 Exceptionally Low    List Recognition 19/20 19/20 26-50 Average    Story Recall 4 6 9  Below Average    Story Recognition 10/12 8/12  14-28 Below Average  to Average    Figure Recall 6 7 16  Below Average    Figure Recognition 3/8 5/8 39-57 Average         Intellectual Functioning:       Standard Score Standard Score Percentile   Test of Premorbid Functioning: 97 105 63 Average        Attention/Executive Function:      Trail Making Test (TMT): Raw Score (Scaled Score) Raw Score (Scaled Score) Percentile     Part A 39 secs.,  0 errors (12) 46 secs.,  0 errors (10) 50 Average    Part B 115 secs.,  3 errors (10) 121 secs.,  0 errors (10) 50 Average  *Based on [employer] Older Normative Studies (MOANS)             Scaled Score Scaled Score Percentile   WAIS-IV Digit Span: 10 11 63 Average    Forward 9 11 63 Average    Backward 10 13 84 Above Average    Sequencing 11 10 50 Average         Scaled Score Scaled Score Percentile   WAIS-IV Similarities: 14 12 75 Above Average        D-KEFS Color-Word Interference Test: Raw Score (Scaled Score) Raw Score (Scaled Score) Percentile     Color Naming 34 secs. (11) 34 secs. (11) 63 Average    Word Reading 29 secs. (9) 26 secs. (10) 50 Average    Inhibition 65 secs. (13) 72 secs. (12) 75 Above Average      Total Errors 0 errors (13) 2 errors (11) 63 Average    Inhibition/Switching 94 secs. (11) 87 secs. (11) 63 Average      Total Errors 3 errors (11) 7 errors (7) 16 Below Average        NAB Executive Functions Module, Form 1: T Score T Score Percentile     Judgment 49 77 >99 Exceptionally High        Language:      Verbal Fluency Test: Raw Score (Scaled Score) Raw Score (Scaled Score) Percentile     Phonemic Fluency (CFL) 43 (12) 44 (13) 84 Above Average    Category Fluency 42 (12) 45 (13) 84 Above Average  *Based on [employer] Older Normative Studies (MOANS)            NAB Language Module, Form 1: T Score T Score Percentile     Auditory Comprehension 58 58 79 Above Average    Naming 31/31 (63) 31/31 (63) 91 Well Above Average        Visuospatial/Visuoconstruction:        Raw Score Raw Score Percentile   Clock Drawing: 10/10 10/10 --- Within Normal Limits         Scaled Score Scaled Score Percentile   WAIS-IV Block Design: 11 10 50 Average        Mood and Personality:       Raw Score Raw Score Percentile   Geriatric Depression Scale: 5 4 --- Within Normal Limits  Geriatric Anxiety Scale: 9  11 --- Minimal    Somatic 6 7 --- Mild    Cognitive 2 2 --- Minimal    Affective 1 2 --- Minimal        Additional Questionnaires:       Raw Score Raw Score Percentile   PROMIS Sleep Disturbance Questionnaire: 9 9 --- None to Slight   Informed Consent and Coding/Compliance:   The current evaluation represents a clinical evaluation for the purposes previously outlined by the referral source and is in no way reflective of a forensic evaluation.   Mr. Petrelli was provided with a verbal description of the nature and purpose of the present neuropsychological evaluation. Also reviewed were the foreseeable risks and/or discomforts and benefits of the procedure, limits of confidentiality, and mandatory reporting requirements of this provider. The patient was given the opportunity to ask questions and receive answers about the evaluation. Oral consent to participate was provided by the patient.   This evaluation was conducted by Newman Nickels, Ph.D., ABPP-CN, board certified clinical neuropsychologist. Mr. Allbritton completed a clinical interview with Dr. Milbert Coulter, billed as one unit [phone], and 140 minutes of cognitive testing and scoring, billed as one unit [phone] and four additional units [id number]. Psychometrist Wallace Keller, B.S. assisted Dr. Milbert Coulter with test administration and scoring procedures. As a separate and discrete service, one unit [id number] and two units [phone] were billed for Dr. Tammy Sours time spent in interpretation and report writing.

## 2023-08-03 ENCOUNTER — Ambulatory Visit: Payer: Self-pay

## 2023-08-03 ENCOUNTER — Institutional Professional Consult (permissible substitution): Payer: Medicare HMO | Admitting: Psychology

## 2023-08-10 ENCOUNTER — Encounter: Payer: Medicare HMO | Admitting: Psychology

## 2023-08-15 ENCOUNTER — Ambulatory Visit: Payer: Medicare Other | Admitting: Psychology

## 2023-08-15 DIAGNOSIS — G3184 Mild cognitive impairment, so stated: Secondary | ICD-10-CM

## 2023-08-15 NOTE — Progress Notes (Signed)
   Neuropsychology Feedback Session [name]. [facility] [place] Department of Neurology  Reason for Referral:   [name] is a 83 y.o. right-handed Caucasian male referred by Marlowe Kays, PA-C, to characterize his current cognitive functioning and assist with diagnostic clarity and treatment planning in the context of a prior mild neurocognitive disorder due to unclear etiology and concern for progressive cognitive decline.   Feedback:   [name] completed a comprehensive neuropsychological evaluation on 08/02/2023. Please refer to that encounter for the full report and recommendations. Briefly, results suggested variability but an overall weakness learning and later retrieving verbal information across memory tasks. Performances were appropriate relative to age-matched peers across all other assessed cognitive domains. Relative to his previous evaluation in February 2024, subtle decline was exhibited across learning trials of verbal memory tasks. However, very subtle improvements were also noted across story and figure based memory tasks surrounding delayed retrieval. Subtle improvements could also be argued across semantic fluency and certain visuospatial aspects. Outside of this, [name] exhibited a very large degree of stability over time. Underlying symptomatic Alzheimer's disease appears unlikely based upon current testing. Both [name] and his wife have remained consistent in stating that memory dysfunction was observed following his CABG surgical procedure in or around 2022. Memory loss is not uncommon after this type of procedure. In most cases, it is temporary and resolves itself over time. However, medically, [name] has several chronic ailments which can negatively impact memory and cognitive functioning (e.g., coronary artery disease, a past myocardial infarction, hypertension, hyperlipidemia, prediabetes, hypothyroidism, vitamin B12 deficiency). Neurologically, his past  brain MRI also revealed mild microvascular ischemic disease. Trouble with inefficient learning across memory tasks can certainly be caused by chronic vascular/medical ailments, as well as ongoing hearing loss. Given strength and stability across the majority of his evaluation, this combination of factors would appear to be the most likely culprit at the present time. Continued monitoring will be beneficial moving forward.   [name] was accompanied by his wife during the current feedback session. Content of the current session focused on the results of his neuropsychological evaluation. [name] was given the opportunity to ask questions and his questions were answered. He was encouraged to reach out should additional questions arise. A copy of his report was provided at the conclusion of the visit.      One unit [phone] was billed for Dr. Tammy Sours time spent preparing for, conducting, and documenting the current feedback session with [name].

## 2023-08-26 ENCOUNTER — Other Ambulatory Visit: Payer: Self-pay | Admitting: Family Medicine

## 2023-08-26 DIAGNOSIS — E782 Mixed hyperlipidemia: Secondary | ICD-10-CM

## 2023-08-26 DIAGNOSIS — I25118 Atherosclerotic heart disease of native coronary artery with other forms of angina pectoris: Secondary | ICD-10-CM

## 2023-08-26 NOTE — Telephone Encounter (Signed)
 Copied from CRM [id number]. Topic: Clinical - Medication Refill >> Aug 26, 2023  1:30 PM Jeffery Turner wrote: Most Recent Primary Care Visit:  Provider: [facility]  Department: [facility]  Visit Type: OFFICE VISIT  Date: 07/04/2023  Medication: REPATHA 140 MG/ML SOSY  Has the patient contacted their pharmacy? Yes   Is this the correct pharmacy for this prescription? Yes This is the patient's preferred pharmacy:   [facility] [address] [place], [place] [address] Phone [phone]  Has the prescription been filled recently? Yes  Is the patient out of the medication? Yes  Has the patient been seen for an appointment in the last year OR does the patient have an upcoming appointment? Yes  Can we respond through MyChart? No  Agent: Please be advised that Rx refills may take up to 3 business days. We ask that you follow-up with your pharmacy.  Pt stated that she needs all his maintenance medication to be sent over to [facility] and a list of medication.

## 2023-08-26 NOTE — Telephone Encounter (Signed)
 Last Fill: 10/20/22  Last OV: 07/04/23 Next OV: 10/31/23  Routing to provider for review/authorization.

## 2023-08-29 MED ORDER — REPATHA 140 MG/ML ~~LOC~~ SOSY: PREFILLED_SYRINGE | SUBCUTANEOUS | 3 refills | Status: DC

## 2023-09-20 ENCOUNTER — Other Ambulatory Visit: Payer: Self-pay

## 2023-09-20 ENCOUNTER — Telehealth: Payer: Self-pay | Admitting: Family Medicine

## 2023-09-20 DIAGNOSIS — K219 Gastro-esophageal reflux disease without esophagitis: Secondary | ICD-10-CM

## 2023-09-20 DIAGNOSIS — R413 Other amnesia: Secondary | ICD-10-CM

## 2023-09-20 MED ORDER — CYANOCOBALAMIN 1000 MCG/ML IJ SOLN: 1000.0000 ug | INTRAMUSCULAR | 3 refills | Status: DC

## 2023-09-20 MED ORDER — DONEPEZIL HCL 10 MG PO TABS: 10.0000 mg | ORAL_TABLET | Freq: Every day | ORAL | 3 refills | Status: DC

## 2023-09-20 MED ORDER — PANTOPRAZOLE SODIUM 40 MG PO TBEC: 40.0000 mg | DELAYED_RELEASE_TABLET | Freq: Every day | ORAL | 3 refills | Status: DC

## 2023-09-20 MED ORDER — MEMANTINE HCL 10 MG PO TABS: 10.0000 mg | ORAL_TABLET | Freq: Two times a day (BID) | ORAL | 1 refills | Status: DC

## 2023-09-20 NOTE — Telephone Encounter (Signed)
 [employer] Medical Clarification Request

## 2023-09-21 ENCOUNTER — Other Ambulatory Visit: Payer: Self-pay

## 2023-09-21 MED ORDER — LUER LOCK SAFETY SYRINGES 25G X 1" 3 ML MISC: 1.0000 | 1 refills | Status: AC

## 2023-10-30 NOTE — Progress Notes (Signed)
 Subjective:  Patient ID: Jeffery Turner, male    DOB: Apr 18, 1941  Age: 82 y.o. MRN: [id number]  Chief Complaint  Patient presents with   Medical Management of Chronic Issues    HPI:  Alzheimer's dementia: Patient is taking Namenda  10 mg twice daily.  Aricept  10 mg before bed was held, but still in his list and it appears he is taking it.     Hypothyroidism: He takes Levothyroxine  25 mcg daily.   CORONARY ARTERY DISEASE: Repatha , NTG. Aspirin . No significant chest pain or breathing issues.  Sees cardiology.  Prediabetic: Last A1c was 6.2%. Taking Metformin  500 mg daily.  Paroxysmal Atrial fibrillation: not currently on medicines.  Discussed the use of AI scribe software for clinical note transcription with the patient, who gave verbal consent to proceed.  History of Present Illness   Jeffery A Jane "Al" is an 83 year old male who presents for routine follow-up and blood work monitoring. He is accompanied by his wife, who helps manage his medications.  He needs to give blood regularly and humorously refers to the medical staff as 'vampires'.  His hemoglobin A1c levels have been increasing, with a reading of 6.0 in September of last year and 6.5 in January. He consumes a lot of bread, which he now understands can affect his blood sugar levels.  He underwent a colonoscopy where eight polyps were removed. None of the polyps were cancerous. He is advised to return for another colonoscopy in three years due to the number of polyps removed.  He had his esophagus stretched due to it being in bad shape, which has resulted in less frequent nausea. He still experiences occasional nausea and stomach irritation, which was described as gastritis, although not officially diagnosed as such.  He receives a B12 shot once a month, although his wife mentions that he takes it twice a month. He is currently taking his normal medications, including blood pressure medications, and took them before the visit.           10/31/2023    7:43 AM 03/03/2023    8:37 AM 02/03/2023    2:09 PM 10/25/2022    9:20 AM 09/28/2022    3:28 PM  Depression screen PHQ 2/9  Decreased Interest 0 0 0 0 0  Down, Depressed, Hopeless 0 0 0 0 0  PHQ - 2 Score 0 0 0 0 0        10/31/2023    7:43 AM  Fall Risk   Falls in the past year? 0  Number falls in past yr: 0  Injury with Fall? 0  Risk for fall due to : No Fall Risks    Patient Care Team: Mercy Stall, MD as PCP - General (Family Medicine) Hassan Links, MD as PCP - Cardiology (Cardiology)   Review of Systems  Constitutional:  Negative for chills, fatigue and fever.  HENT:  Negative for congestion, ear pain, sinus pressure and sore throat.   Respiratory:  Negative for cough and shortness of breath.   Cardiovascular:  Negative for chest pain.  Gastrointestinal:  Negative for abdominal pain, constipation, diarrhea, nausea and vomiting.  Genitourinary:  Negative for dysuria and frequency.  Musculoskeletal:  Negative for arthralgias, back pain and myalgias.  Neurological:  Negative for dizziness and headaches.  Psychiatric/Behavioral:  Negative for dysphoric mood. The patient is not nervous/anxious.     Current Outpatient Medications on File Prior to Visit  Medication Sig Dispense Refill   aspirin  EC 81 MG  tablet Take 1 tablet (81 mg total) by mouth daily. Swallow whole. 90 tablet 3   cyanocobalamin  (VITAMIN B12) 1000 MCG/ML injection Inject 1 mL (1,000 mcg total) into the muscle every 30 (thirty) days. 1 mL 3   donepezil  (ARICEPT ) 10 MG tablet Take 1 tablet (10 mg total) by mouth at bedtime. 90 tablet 3   Evolocumab  (REPATHA ) 140 MG/ML SOSY INJECT 140MG  UNDER THE SKIN EVERY 2 WEEKS 6 mL 3   levothyroxine  (SYNTHROID ) 25 MCG tablet TAKE 1 TABLET EVERY DAY BEFORE BREAKFAST 90 tablet 3   memantine  (NAMENDA ) 10 MG tablet Take 1 tablet (10 mg total) by mouth 2 (two) times daily. 180 tablet 1   metFORMIN  (GLUCOPHAGE ) 500 MG tablet TAKE 1 TABLET EVERY DAY  WITH BREAKFAST 90 tablet 3   nitroGLYCERIN  (NITROSTAT ) 0.4 MG SL tablet Place 1 tablet (0.4 mg total) under the tongue every 5 (five) minutes as needed. If require 2, recommend call 911. 50 tablet 0   pantoprazole  (PROTONIX ) 40 MG tablet Take 1 tablet (40 mg total) by mouth daily. 90 tablet 3   SYRINGE-NEEDLE, DISP, 3 ML (LUER LOCK SAFETY SYRINGES) 25G X 1" 3 ML MISC 1 each by Does not apply route once a week. 50 each 1   No current facility-administered medications on file prior to visit.   Past Medical History:  Diagnosis Date   Acoustic neuroma    Anemia    Angina pectoris 02/06/2018   Apnea 08/11/2021   Bilateral leg weakness 04/04/2021   Constant exophthalmos    right   Coronary artery disease of native artery of native heart with stable angina pectoris 07/21/2010   s/p stent to CFX 1997 2/2 MI s/p DES to mRCA and DES mLAD 07/14/10 2/2 [place]  cath 07/14/10: dLAD 60%; CFX stent ok; OM1 30-40%; pRCA 30%; PDA 40%; EF 60%   Daytime somnolence 08/11/2021   Epistaxis 07/21/2010   Hypertensive heart disease without heart failure 08/11/2021   Mild cognitive impairment of uncertain or unknown etiology 07/27/2022   Mixed hyperlipidemia 07/21/2010   Muscle cramp 01/18/2022   Old myocardial infarction 07/21/2010   Osteoarthritis 07/21/2010   PAD (peripheral artery disease) 08/11/2021   PMR (polymyalgia rheumatica) 04/03/2021   Prediabetes 08/11/2021   S/P CABG x 3 02/05/2020   Secondary hypothyroidism 02/13/2021   Sensorineural hearing loss, bilateral    Shortness of breath 07/21/2010   Statin myopathy 04/04/2021   Transient visual loss, right    wears glasses   Ulnar neuropathy of both upper extremities 04/20/2022   Upper respiratory tract infection due to COVID-19 virus 05/14/2021   Vitamin B12 deficiency 07/04/2023   Past Surgical History:  Procedure Laterality Date   CARDIAC CATHETERIZATION     CORONARY ARTERY BYPASS GRAFT N/A 02/05/2020   Procedure: CORONARY ARTERY BYPASS GRAFTING  (CABG), ON PUMP, TIMES THREE, USING LEFT INTERNAL MAMMARY ARTERY TO LEFT ANTERIOR DESCENDING ARTERY, REVERSE SAPHENOUS VEIN GRAFT TO POSTERIOR DESCENDING ARTERY AND CIRCUMFLEX ARTERY;  Surgeon: Norita Beauvais, MD;  Location: [facility] OR;  Service: Open Heart Surgery;  Laterality: N/A;   ENDOVEIN HARVEST OF GREATER SAPHENOUS VEIN Right 02/05/2020   Procedure: ENDOVEIN HARVEST OF GREATER SAPHENOUS VEIN;  Surgeon: Norita Beauvais, MD;  Location: [facility] OR;  Service: Open Heart Surgery;  Laterality: Right;   LEFT HEART CATH AND CORONARY ANGIOGRAPHY N/A 01/25/2020   Procedure: LEFT HEART CATH AND CORONARY ANGIOGRAPHY;  Surgeon: Arleen Lacer, MD;  Location: [facility] INVASIVE CV LAB;  Service: Cardiovascular;  Laterality: N/A;   TEE  WITHOUT CARDIOVERSION N/A 02/05/2020   Procedure: TRANSESOPHAGEAL ECHOCARDIOGRAM (TEE);  Surgeon: Norita Beauvais, MD;  Location: [facility] OR;  Service: Open Heart Surgery;  Laterality: N/A;   TONSILLECTOMY AND ADENOIDECTOMY     WISDOM TOOTH EXTRACTION      Family History  Problem Relation Age of Onset   Heart failure Mother    Alzheimer's disease Mother    Dementia Mother    Dementia Sister    Alzheimer's disease Sister    Memory loss Brother    Coronary artery disease Other    Social History   Socioeconomic History   Marital status: Married    Spouse name: Not on file   Number of children: 2   Years of education: 14   Highest education level: Some college, no degree  Occupational History   Occupation: Retired    Comment: Holiday representative  Tobacco Use   Smoking status: Former    Current packs/day: 0.00    Average packs/day: 1.5 packs/day for 37.0 years (55.5 ttl pk-yrs)    Types: Cigarettes    Start date: 05/1963    Quit date: 05/2000    Years since quitting: 23.5   Smokeless tobacco: Never  Vaping Use   Vaping status: Never Used  Substance and Sexual Activity   Alcohol use: No   Drug use: No   Sexual activity: Not Currently  Other Topics Concern   Not on file   Social History Narrative   He lives in [place] with family.  He is a retired  Museum/gallery exhibitions officer.  He quit tobacco in 1997 with approximately 30-pack-  year history and denies alcohol or drug abuse.   Right handed   Drinks caffeine   Two story home      Social Drivers of Health   Financial Resource Strain: Low Risk  (02/03/2023)   Overall Financial Resource Strain (CARDIA)    Difficulty of Paying Living Expenses: Not hard at all  Food Insecurity: No Food Insecurity (02/03/2023)   Hunger Vital Sign    Worried About Running Out of Food in the Last Year: Never true    Ran Out of Food in the Last Year: Never true  Transportation Needs: No Transportation Needs (02/03/2023)   PRAPARE - Administrator, Civil Service (Medical): No    Lack of Transportation (Non-Medical): No  Physical Activity: Inactive (02/03/2023)   Exercise Vital Sign    Days of Exercise per Week: 0 days    Minutes of Exercise per Session: 0 min  Stress: No Stress Concern Present (02/03/2023)   [employer] of Occupational Health - Occupational Stress Questionnaire    Feeling of Stress : Not at all  Social Connections: Moderately Isolated (02/03/2023)   Social Connection and Isolation Panel [NHANES]    Frequency of Communication with Friends and Family: More than three times a week    Frequency of Social Gatherings with Friends and Family: More than three times a week    Attends Religious Services: Never    Database administrator or Organizations: No    Attends Engineer, structural: Never    Marital Status: Married    Objective:  BP 116/80   Pulse 66   Temp 98.1 F (36.7 C)   Ht 5\' 7"  (1.702 m)   Wt 203 lb 9.6 oz (92.4 kg)   SpO2 97%   BMI 31.89 kg/m      10/31/2023    7:41 AM 07/04/2023    8:45 AM 03/03/2023  8:34 AM  BP/Weight  Systolic BP 116 122 108  Diastolic BP 80 72 68  Wt. (Lbs) 203.6 207 201.8  BMI 31.89 kg/m2 32.42 kg/m2 31.61 kg/m2    Physical Exam Vitals reviewed.   Constitutional:      Appearance: Normal appearance.  Cardiovascular:     Rate and Rhythm: Normal rate and regular rhythm.     Heart sounds: Normal heart sounds.  Pulmonary:     Effort: Pulmonary effort is normal.     Breath sounds: Normal breath sounds.  Abdominal:     General: Bowel sounds are normal.     Palpations: Abdomen is soft.     Tenderness: There is no abdominal tenderness.  Neurological:     Mental Status: He is alert and oriented to person, place, and time.  Psychiatric:        Mood and Affect: Mood normal.        Behavior: Behavior normal.     Diabetic Foot Exam - Simple   No data filed      Lab Results  Component Value Date   WBC 5.7 10/31/2023   HGB 14.1 10/31/2023   HCT 43.2 10/31/2023   PLT 261 10/31/2023   GLUCOSE 95 10/31/2023   CHOL 78 (L) 10/31/2023   TRIG 115 10/31/2023   HDL 29 (L) 10/31/2023   LDLDIRECT 152.5 10/05/2010   LDLCALC 28 10/31/2023   ALT 14 10/31/2023   AST 17 10/31/2023   NA 136 10/31/2023   K 4.6 10/31/2023   CL 100 10/31/2023   CREATININE 1.07 10/31/2023   BUN 19 10/31/2023   CO2 21 10/31/2023   TSH 1.630 10/31/2023   INR 1.4 (H) 02/05/2020   HGBA1C 6.1 (H) 10/31/2023      Assessment & Plan:  Hypertensive heart disease without heart failure Assessment & Plan: Well controlled.  Continue to work on eating a healthy diet and exercise.  Labs drawn today.   No major side effects reported, and no issues with compliance. The current medical regimen is effective;  continue present plan with Nitroglycerin , Aspirin  81mg  Will adjust medication as needed depending on labs BP Readings from Last 3 Encounters:  10/31/23 116/80  07/04/23 122/72  03/03/23 108/68        Orders: -     CBC with Differential/Platelet -     Comprehensive metabolic panel with GFR  Prediabetes Assessment & Plan: A1c increased from 6.0 to 6.5, indicating worsening glycemic control. High bread consumption may contribute. - Order blood test to  re-evaluate A1c levels. - Discuss dietary modifications to reduce bread intake.  Orders: -     Hemoglobin A1c  Mixed hyperlipidemia Assessment & Plan: Well controlled.  Continue to work on eating a healthy diet and exercise.  Labs drawn today.   No major side effects reported, and no issues with compliance. The current medical regimen is effective;  continue present plan with Repatha  140mg  Will adjust medication as needed depending on labs Lab Results  Component Value Date   LDLCALC 42 07/04/2023     Orders: -     Lipid panel  Secondary hypothyroidism Assessment & Plan: Controlled Denies any new or worsening symptoms  Labs drawn today Will adjust treatment depending on results  Orders: -     TSH  Dementia due to Alzheimer's disease [facility]) Assessment & Plan: Care giver denies any new or worsening symptoms Continues to do well without worsening decline Continue to monitor Will adjust treatment based on symptos Continue taking Aricept  10mg ,  Namenda  10mg  as prescribed   Vitamin B12 deficiency Assessment & Plan: Controlled Continue to monitor symptoms Labs drawn today Will adjust treatment based on results  Orders: -     Vitamin B12  Type 2 diabetes mellitus with hyperglycemia, without long-term current use of insulin  (HCC) Assessment & Plan: A1c increased from 6.0 to 6.5, indicating worsening glycemic control. High bread consumption may contribute. - Order blood test to re-evaluate A1c levels. - Discuss dietary modifications to reduce bread intake.   Paroxysmal atrial fibrillation (HCC) Assessment & Plan: Denies any new or changing symptoms Continue to monitor  Continue to follow up with Dr.Munley  Will adjust treatment based on signs and symptoms if they return      No orders of the defined types were placed in this encounter.   Orders Placed This Encounter  Procedures   CBC with Differential/Platelet   Comprehensive metabolic panel with GFR    Lipid panel   Hemoglobin A1c   TSH   Vitamin B12     Follow-up: Return in about 3 months (around 01/31/2024) for Chronic, Dr. Reinhold Carbine.   I,Marla I Leal-Borjas,acting as a scribe for [employer], PA.,have documented all relevant documentation on the behalf of Odilia Bennett, PA,as directed by  Odilia Bennett, PA while in the presence of Odilia Bennett, [place].   An After Visit Summary was printed and given to the patient.  Odilia Bennett, [place] [facility] [phone]

## 2023-10-31 ENCOUNTER — Encounter: Payer: Self-pay | Admitting: Family Medicine

## 2023-10-31 ENCOUNTER — Ambulatory Visit: Payer: Medicare HMO | Admitting: Physician Assistant

## 2023-10-31 VITALS — BP 116/80 | HR 66 | Temp 98.1°F | Ht 67.0 in | Wt 203.6 lb

## 2023-10-31 DIAGNOSIS — I119 Hypertensive heart disease without heart failure: Secondary | ICD-10-CM | POA: Diagnosis not present

## 2023-10-31 DIAGNOSIS — G309 Alzheimer's disease, unspecified: Secondary | ICD-10-CM | POA: Diagnosis not present

## 2023-10-31 DIAGNOSIS — E782 Mixed hyperlipidemia: Secondary | ICD-10-CM | POA: Diagnosis not present

## 2023-10-31 DIAGNOSIS — F0280 Dementia in other diseases classified elsewhere without behavioral disturbance: Secondary | ICD-10-CM

## 2023-10-31 DIAGNOSIS — E038 Other specified hypothyroidism: Secondary | ICD-10-CM

## 2023-10-31 DIAGNOSIS — E1165 Type 2 diabetes mellitus with hyperglycemia: Secondary | ICD-10-CM | POA: Insufficient documentation

## 2023-10-31 DIAGNOSIS — E538 Deficiency of other specified B group vitamins: Secondary | ICD-10-CM

## 2023-10-31 DIAGNOSIS — E1169 Type 2 diabetes mellitus with other specified complication: Secondary | ICD-10-CM | POA: Insufficient documentation

## 2023-10-31 DIAGNOSIS — R7303 Prediabetes: Secondary | ICD-10-CM | POA: Diagnosis not present

## 2023-10-31 DIAGNOSIS — I480 Paroxysmal atrial fibrillation: Secondary | ICD-10-CM

## 2023-10-31 NOTE — Assessment & Plan Note (Signed)
 Controlled Continue to monitor symptoms Labs drawn today Will adjust treatment based on results

## 2023-10-31 NOTE — Assessment & Plan Note (Signed)
 Well controlled.  Continue to work on eating a healthy diet and exercise.  Labs drawn today.   No major side effects reported, and no issues with compliance. The current medical regimen is effective;  continue present plan with Repatha  140mg  Will adjust medication as needed depending on labs Lab Results  Component Value Date   LDLCALC 42 07/04/2023

## 2023-10-31 NOTE — Assessment & Plan Note (Signed)
 A1c increased from 6.0 to 6.5, indicating worsening glycemic control. High bread consumption may contribute. - Order blood test to re-evaluate A1c levels. - Discuss dietary modifications to reduce bread intake.

## 2023-10-31 NOTE — Assessment & Plan Note (Signed)
 Controlled Denies any new or worsening symptoms  Labs drawn today Will adjust treatment depending on results

## 2023-10-31 NOTE — Assessment & Plan Note (Signed)
 Well controlled.  Continue to work on eating a healthy diet and exercise.  Labs drawn today.   No major side effects reported, and no issues with compliance. The current medical regimen is effective;  continue present plan with Nitroglycerin , Aspirin  81mg  Will adjust medication as needed depending on labs BP Readings from Last 3 Encounters:  10/31/23 116/80  07/04/23 122/72  03/03/23 108/68

## 2023-10-31 NOTE — Patient Instructions (Signed)
 VISIT SUMMARY:  You came in today for your routine follow-up and blood work monitoring. We discussed your hemoglobin A1c levels, which have been increasing, and your recent colonoscopy results. We also talked about your occasional nausea and the frequency of your B12 shots.  YOUR PLAN:  -ELEVATED A1C: Your hemoglobin A1c levels, which measure your average blood sugar over the past three months, have increased from 6.0 to 6.5. This suggests that your blood sugar control is worsening. We will order a blood test to re-evaluate your A1c levels and discuss dietary changes, especially reducing your bread intake, to help manage your blood sugar.  -NAUSEA: You have been experiencing occasional nausea, which has improved since your esophagus was stretched. This procedure has helped, but you still have some symptoms. We will continue to monitor this and make adjustments as needed.  INSTRUCTIONS:  Please follow up with the blood test to re-evaluate your A1c levels. Additionally, you should return for another colonoscopy in three years due to the number of polyps removed during your last procedure.

## 2023-11-01 LAB — CBC WITH DIFFERENTIAL/PLATELET
Basophils Absolute: 0 10*3/uL (ref 0.0–0.2)
Basos: 1 %
EOS (ABSOLUTE): 0.1 10*3/uL (ref 0.0–0.4)
Eos: 2 %
Hematocrit: 43.2 % (ref 37.5–51.0)
Hemoglobin: 14.1 g/dL (ref 13.0–17.7)
Immature Grans (Abs): 0 10*3/uL (ref 0.0–0.1)
Immature Granulocytes: 1 %
Lymphocytes Absolute: 1 10*3/uL (ref 0.7–3.1)
Lymphs: 18 %
MCH: 29.4 pg (ref 26.6–33.0)
MCHC: 32.6 g/dL (ref 31.5–35.7)
MCV: 90 fL (ref 79–97)
Monocytes Absolute: 0.9 10*3/uL (ref 0.1–0.9)
Monocytes: 15 %
Neutrophils Absolute: 3.7 10*3/uL (ref 1.4–7.0)
Neutrophils: 63 %
Platelets: 261 10*3/uL (ref 150–450)
RBC: 4.79 x10E6/uL (ref 4.14–5.80)
RDW: 13.2 % (ref 11.6–15.4)
WBC: 5.7 10*3/uL (ref 3.4–10.8)

## 2023-11-01 LAB — LIPID PANEL
Chol/HDL Ratio: 2.7 ratio (ref 0.0–5.0)
Cholesterol, Total: 78 mg/dL — ABNORMAL LOW (ref 100–199)
HDL: 29 mg/dL — ABNORMAL LOW (ref >39)
LDL Chol Calc (NIH): 28 mg/dL (ref 0–99)
Triglycerides: 115 mg/dL (ref 0–149)
VLDL Cholesterol Cal: 21 mg/dL (ref 5–40)

## 2023-11-01 LAB — COMPREHENSIVE METABOLIC PANEL WITH GFR
ALT: 14 IU/L (ref 0–44)
AST: 17 IU/L (ref 0–40)
Albumin: 4.3 g/dL (ref 3.7–4.7)
Alkaline Phosphatase: 65 IU/L (ref 44–121)
BUN/Creatinine Ratio: 18 (ref 10–24)
BUN: 19 mg/dL (ref 8–27)
Bilirubin Total: 0.5 mg/dL (ref 0.0–1.2)
CO2: 21 mmol/L (ref 20–29)
Calcium: 9 mg/dL (ref 8.6–10.2)
Chloride: 100 mmol/L (ref 96–106)
Creatinine, Ser: 1.07 mg/dL (ref 0.76–1.27)
Globulin, Total: 2.4 g/dL (ref 1.5–4.5)
Glucose: 95 mg/dL (ref 70–99)
Potassium: 4.6 mmol/L (ref 3.5–5.2)
Sodium: 136 mmol/L (ref 134–144)
Total Protein: 6.7 g/dL (ref 6.0–8.5)
eGFR: 69 mL/min/{1.73_m2} (ref >59)

## 2023-11-01 LAB — TSH: TSH: 1.63 u[IU]/mL (ref 0.450–4.500)

## 2023-11-01 LAB — HEMOGLOBIN A1C
Est. average glucose Bld gHb Est-mCnc: 128 mg/dL
Hgb A1c MFr Bld: 6.1 % — ABNORMAL HIGH (ref 4.8–5.6)

## 2023-11-01 LAB — VITAMIN B12: Vitamin B-12: 603 pg/mL (ref 232–1245)

## 2023-11-02 ENCOUNTER — Ambulatory Visit: Payer: Self-pay | Admitting: Physician Assistant

## 2023-11-11 DIAGNOSIS — I480 Paroxysmal atrial fibrillation: Secondary | ICD-10-CM | POA: Insufficient documentation

## 2023-11-11 DIAGNOSIS — F0280 Dementia in other diseases classified elsewhere without behavioral disturbance: Secondary | ICD-10-CM | POA: Insufficient documentation

## 2023-11-11 NOTE — Assessment & Plan Note (Signed)
 Denies any new or changing symptoms Continue to monitor  Continue to follow up with Dr.Munley  Will adjust treatment based on signs and symptoms if they return

## 2023-11-11 NOTE — Assessment & Plan Note (Signed)
 Care giver denies any new or worsening symptoms Continues to do well without worsening decline Continue to monitor Will adjust treatment based on symptos Continue taking Aricept  10mg , Namenda  10mg  as prescribed

## 2023-11-28 ENCOUNTER — Other Ambulatory Visit: Payer: Self-pay | Admitting: Family Medicine

## 2023-11-28 DIAGNOSIS — R413 Other amnesia: Secondary | ICD-10-CM

## 2023-11-28 DIAGNOSIS — H353113 Nonexudative age-related macular degeneration, right eye, advanced atrophic without subfoveal involvement: Secondary | ICD-10-CM | POA: Diagnosis not present

## 2023-11-28 LAB — HM DIABETES EYE EXAM

## 2024-01-27 ENCOUNTER — Other Ambulatory Visit: Payer: Self-pay | Admitting: Family Medicine

## 2024-02-09 ENCOUNTER — Ambulatory Visit

## 2024-02-09 VITALS — Ht 67.0 in | Wt 203.0 lb

## 2024-02-09 DIAGNOSIS — Z0000 Encounter for general adult medical examination without abnormal findings: Secondary | ICD-10-CM

## 2024-02-09 NOTE — Patient Instructions (Signed)
 Mr. Jeffery Turner , Thank you for taking time out of your busy schedule to complete your Annual Wellness Visit with me. I enjoyed our conversation and look forward to speaking with you again next year. I, as well as your care team,  appreciate your ongoing commitment to your health goals. Please review the following plan we discussed and let me know if I can assist you in the future. Your Game plan/ To Do List     Follow up Visits: We will see or speak with you next year for your Next Medicare AWV with our clinical staff Have you seen your provider in the last 6 months (3 months if uncontrolled diabetes)? Yes  Clinician Recommendations:  Aim for 30 minutes of exercise or brisk walking, 6-8 glasses of water, and 5 servings of fruits and vegetables each day.       This is a list of the screenings recommended for you:  Health Maintenance  Topic Date Due   Eye exam for diabetics  Never done   Yearly kidney health urinalysis for diabetes  Never done   Zoster (Shingles) Vaccine (1 of 2) Never done   Complete foot exam   09/28/2023   COVID-19 Vaccine (6 - 2024-25 season) 02/13/2024*   Pneumococcal Vaccine for age over 30 (1 of 2 - PCV) 03/02/2024*   Flu Shot  09/11/2024*   Hemoglobin A1C  05/02/2024   Yearly kidney function blood test for diabetes  10/30/2024   Medicare Annual Wellness Visit  02/08/2025   DTaP/Tdap/Td vaccine (3 - Td or Tdap) 04/07/2025   HPV Vaccine  Aged Out   Meningitis B Vaccine  Aged Out  *Topic was postponed. The date shown is not the original due date.    Advanced directives: (ACP Link)Information on Advanced Care Planning can be found at [place]  Secretary of [facility] Advance Health Care Directives Advance Health Care Directives. [email or web address]   Advance Care Planning is important because it:  [x]  Makes sure you receive the medical care that is consistent with your values, goals, and preferences  [x]  It provides guidance to your family and loved ones and reduces their  decisional burden about whether or not they are making the right decisions based on your wishes.  Follow the link provided in your after visit summary or read over the paperwork we have mailed to you to help you started getting your Advance Directives in place. If you need assistance in completing these, please reach out to us  so that we can help you!  See attachments for Preventive Care and Fall Prevention Tips.

## 2024-02-09 NOTE — Progress Notes (Signed)
 Subjective:   Deantae A Heldt is a 83 y.o. who presents for a Medicare Wellness preventive visit.  As a reminder, Annual Wellness Visits don't include a physical exam, and some assessments may be limited, especially if this visit is performed virtually. We may recommend an in-person follow-up visit with your provider if needed.  Visit Complete: Virtual I connected with  Emiel A Mclaren on 02/09/24 by a audio enabled telemedicine application and verified that I am speaking with the correct person using two identifiers.  Patient Location: Home  Provider Location: Home Office  I discussed the limitations of evaluation and management by telemedicine. The patient expressed understanding and agreed to proceed.  Vital Signs: Because this visit was a virtual/telehealth visit, some criteria may be missing or patient reported. Any vitals not documented were not able to be obtained and vitals that have been documented are patient reported.  VideoDeclined- This patient declined Librarian, academic. Therefore the visit was completed with audio only.  Persons Participating in Visit: Patient assisted by wife.  AWV Questionnaire: No: Patient Medicare AWV questionnaire was not completed prior to this visit.  Cardiac Risk Factors include: advanced age (>52men, >58 women);male gender;diabetes mellitus;dyslipidemia     Objective:    Today's Vitals   02/09/24 1507  Weight: 203 lb (92.1 kg)  Height: 5' 7 (1.702 m)   Body mass index is 31.79 kg/m.     02/09/2024    3:11 PM 02/03/2023    2:12 PM 10/06/2022    2:24 PM 03/31/2022    1:16 PM 04/23/2020    1:40 PM 02/08/2020   12:00 PM 02/01/2020    9:51 AM  Advanced Directives  Does Patient Have a Medical Advance Directive? No No Yes Yes No No No  Would patient like information on creating a medical advance directive? Yes (MAU/Ambulatory/Procedural Areas - Information given) No - Patient declined    Yes  (MAU/Ambulatory/Procedural Areas - Information given) No - Patient declined    Current Medications (verified) Outpatient Encounter Medications as of 02/09/2024  Medication Sig   aspirin  EC 81 MG tablet Take 1 tablet (81 mg total) by mouth daily. Swallow whole.   cyanocobalamin  (VITAMIN B12) 1000 MCG/ML injection INJECT INTRAMUSCULARLY 1 ML  EVERY 30 DAYS (DISCARD 28 DAYS  AFTER FIRST USE)   donepezil  (ARICEPT ) 10 MG tablet Take 1 tablet (10 mg total) by mouth at bedtime.   Evolocumab  (REPATHA ) 140 MG/ML SOSY INJECT 140MG  UNDER THE SKIN EVERY 2 WEEKS   levothyroxine  (SYNTHROID ) 25 MCG tablet TAKE 1 TABLET EVERY DAY BEFORE BREAKFAST   memantine  (NAMENDA ) 10 MG tablet TAKE 1 TABLET BY MOUTH TWICE  DAILY   metFORMIN  (GLUCOPHAGE ) 500 MG tablet TAKE 1 TABLET EVERY DAY WITH BREAKFAST   nitroGLYCERIN  (NITROSTAT ) 0.4 MG SL tablet Place 1 tablet (0.4 mg total) under the tongue every 5 (five) minutes as needed. If require 2, recommend call 911.   pantoprazole  (PROTONIX ) 40 MG tablet Take 1 tablet (40 mg total) by mouth daily.   SYRINGE-NEEDLE, DISP, 3 ML (LUER LOCK SAFETY SYRINGES) 25G X 1 3 ML MISC 1 each by Does not apply route once a week.   No facility-administered encounter medications on file as of 02/09/2024.    Allergies (verified) Bactrim [sulfamethoxazole-trimethoprim], Codeine, and Statins   History: Past Medical History:  Diagnosis Date   Acoustic neuroma    Anemia    Angina pectoris 02/06/2018   Apnea 08/11/2021   Bilateral leg weakness 04/04/2021   Constant exophthalmos  right   Coronary artery disease of native artery of native heart with stable angina pectoris 07/21/2010   s/p stent to CFX 1997 2/2 MI s/p DES to [facility] and DES mLAD 07/14/10 2/2 [place]  cath 07/14/10: dLAD 60%; CFX stent ok; OM1 30-40%; pRCA 30%; PDA 40%; EF 60%   Daytime somnolence 08/11/2021   Epistaxis 07/21/2010   Hypertensive heart disease without heart failure 08/11/2021   Mild cognitive impairment of  uncertain or unknown etiology 07/27/2022   Mixed hyperlipidemia 07/21/2010   Muscle cramp 01/18/2022   Old myocardial infarction 07/21/2010   Osteoarthritis 07/21/2010   PAD (peripheral artery disease) 08/11/2021   PMR (polymyalgia rheumatica) 04/03/2021   Prediabetes 08/11/2021   S/P CABG x 3 02/05/2020   Secondary hypothyroidism 02/13/2021   Sensorineural hearing loss, bilateral    Shortness of breath 07/21/2010   Statin myopathy 04/04/2021   Transient visual loss, right    wears glasses   Ulnar neuropathy of both upper extremities 04/20/2022   Upper respiratory tract infection due to COVID-19 virus 05/14/2021   Vitamin B12 deficiency 07/04/2023   Past Surgical History:  Procedure Laterality Date   CARDIAC CATHETERIZATION     CORONARY ARTERY BYPASS GRAFT N/A 02/05/2020   Procedure: CORONARY ARTERY BYPASS GRAFTING (CABG), ON PUMP, TIMES THREE, USING LEFT INTERNAL MAMMARY ARTERY TO LEFT ANTERIOR DESCENDING ARTERY, REVERSE SAPHENOUS VEIN GRAFT TO POSTERIOR DESCENDING ARTERY AND CIRCUMFLEX ARTERY;  Surgeon: Army Dallas NOVAK, MD;  Location: [facility] OR;  Service: Open Heart Surgery;  Laterality: N/A;   ENDOVEIN HARVEST OF GREATER SAPHENOUS VEIN Right 02/05/2020   Procedure: ENDOVEIN HARVEST OF GREATER SAPHENOUS VEIN;  Surgeon: Army Dallas NOVAK, MD;  Location: [facility] OR;  Service: Open Heart Surgery;  Laterality: Right;   LEFT HEART CATH AND CORONARY ANGIOGRAPHY N/A 01/25/2020   Procedure: LEFT HEART CATH AND CORONARY ANGIOGRAPHY;  Surgeon: Anner Alm ORN, MD;  Location: [facility] INVASIVE CV LAB;  Service: Cardiovascular;  Laterality: N/A;   TEE WITHOUT CARDIOVERSION N/A 02/05/2020   Procedure: TRANSESOPHAGEAL ECHOCARDIOGRAM (TEE);  Surgeon: Army Dallas NOVAK, MD;  Location: [facility] OR;  Service: Open Heart Surgery;  Laterality: N/A;   TONSILLECTOMY AND ADENOIDECTOMY     WISDOM TOOTH EXTRACTION     Family History  Problem Relation Age of Onset   Heart failure Mother    Alzheimer's disease Mother     Dementia Mother    Dementia Sister    Alzheimer's disease Sister    Memory loss Brother    Coronary artery disease Other    Social History   Socioeconomic History   Marital status: Married    Spouse name: Not on file   Number of children: 2   Years of education: 14   Highest education level: Some college, no degree  Occupational History   Occupation: Retired    Comment: Holiday representative  Tobacco Use   Smoking status: Former    Current packs/day: 0.00    Average packs/day: 1.5 packs/day for 37.0 years (55.5 ttl pk-yrs)    Types: Cigarettes    Start date: 05/1963    Quit date: 05/2000    Years since quitting: 23.7   Smokeless tobacco: Never  Vaping Use   Vaping status: Never Used  Substance and Sexual Activity   Alcohol use: No   Drug use: No   Sexual activity: Not Currently  Other Topics Concern   Not on file  Social History Narrative   He lives in [place] with family.  He is a retired  Museum/gallery exhibitions officer.  He quit tobacco in 1997 with approximately 30-pack-  year history and denies alcohol or drug abuse.   Right handed   Drinks caffeine   Two story home      Social Drivers of Health   Financial Resource Strain: Low Risk  (02/09/2024)   Overall Financial Resource Strain (CARDIA)    Difficulty of Paying Living Expenses: Not hard at all  Food Insecurity: No Food Insecurity (02/09/2024)   Hunger Vital Sign    Worried About Running Out of Food in the Last Year: Never true    Ran Out of Food in the Last Year: Never true  Transportation Needs: No Transportation Needs (02/09/2024)   PRAPARE - Administrator, Civil Service (Medical): No    Lack of Transportation (Non-Medical): No  Physical Activity: Inactive (02/09/2024)   Exercise Vital Sign    Days of Exercise per Week: 0 days    Minutes of Exercise per Session: 0 min  Stress: No Stress Concern Present (02/09/2024)   [employer] of Occupational Health - Occupational Stress Questionnaire    Feeling of Stress:  Not at all  Social Connections: Moderately Isolated (02/09/2024)   Social Connection and Isolation Panel    Frequency of Communication with Friends and Family: More than three times a week    Frequency of Social Gatherings with Friends and Family: More than three times a week    Attends Religious Services: Never    Database administrator or Organizations: No    Attends Engineer, structural: Never    Marital Status: Married    Tobacco Counseling Counseling given: Not Answered    Clinical Intake:  Pre-visit preparation completed: Yes  Pain : No/denies pain  Diabetes: Yes CBG done?: No Did pt. bring in CBG monitor from home?: No  Lab Results  Component Value Date   HGBA1C 6.1 (H) 10/31/2023   HGBA1C 6.5 (H) 07/04/2023   HGBA1C 6.0 (H) 03/03/2023     How often do you need to have someone help you when you read instructions, pamphlets, or other written materials from your doctor or pharmacy?: 1 - Never  Interpreter Needed?: No  Information entered by :: Charmaine Bloodgood LPN   Activities of Daily Living     02/09/2024    3:08 PM  In your present state of health, do you have any difficulty performing the following activities:  Hearing? 0  Vision? 0  Difficulty concentrating or making decisions? 1  Walking or climbing stairs? 0  Dressing or bathing? 0  Doing errands, shopping? 0  Preparing Food and eating ? N  Using the Toilet? N  In the past six months, have you accidently leaked urine? N  Do you have problems with loss of bowel control? N  Managing your Medications? N  Managing your Finances? N  Housekeeping or managing your Housekeeping? N    Patient Care Team: Sherre Clapper, MD as PCP - General (Family Medicine) Monetta Redell PARAS, MD as PCP - Cardiology (Cardiology) Russell Paula E, MD as Consulting Physician Dina Sara E, PA-C (Neurology) Richie Hussar, PhD as Consulting Physician (Psychology)  I have updated your Care Teams any recent Medical  Services you may have received from other providers in the past year.     Assessment:   This is a routine wellness examination for Kito.  Hearing/Vision screen Hearing Screening - Comments:: Some hearing difficulty  Vision Screening - Comments:: Wears rx glasses - up to date with routine eye exams with [facility]    Goals Addressed             This Visit's Progress    DIET - EAT MORE FRUITS AND VEGETABLES   On track    He tries to eat fruits and veetables       Depression Screen     02/09/2024    3:09 PM 10/31/2023    7:43 AM 03/03/2023    8:37 AM 02/03/2023    2:09 PM 10/25/2022    9:20 AM 09/28/2022    3:28 PM 06/22/2022   10:17 AM  PHQ 2/9 Scores  PHQ - 2 Score 0 0 0 0 0 0 0    Fall Risk     02/09/2024    3:22 PM 10/31/2023    7:43 AM 07/04/2023    8:48 AM 03/03/2023    8:36 AM 02/03/2023    2:10 PM  Fall Risk   Falls in the past year? 0 0 0 0 0  Number falls in past yr: 0 0 0 0 0  Injury with Fall? 0 0 0 0 0  Risk for fall due to : Impaired balance/gait No Fall Risks No Fall Risks Impaired mobility No Fall Risks  Follow up Falls prevention discussed;Education provided;Falls evaluation completed  Falls evaluation completed Falls evaluation completed;Falls prevention discussed Falls prevention discussed    MEDICARE RISK AT HOME:  Medicare Risk at Home Any stairs in or around the home?: No If so, are there any without handrails?: No Home free of loose throw rugs in walkways, pet beds, electrical cords, etc?: Yes Adequate lighting in your home to reduce risk of falls?: Yes Life alert?: No Use of a cane, walker or w/c?: Yes Grab bars in the bathroom?: Yes Shower chair or bench in shower?: No Elevated toilet seat or a handicapped toilet?: Yes  TIMED UP AND GO:  Was the test performed?  No  Cognitive Function: Impaired: Patient has current diagnosis of cognitive impairment.    06/30/2021    2:09 PM 04/21/2020    4:31 PM  MMSE - Mini Mental State  Exam  Orientation to time 5 5  Orientation to Place 5 5  Registration 3 3  Attention/ Calculation 5 5  Recall 2 1  Recall-comments  problems with recall  Language- name 2 objects 2 2  Language- repeat 1 1  Language- follow 3 step command 3 3  Language- read & follow direction 1 1  Write a sentence 1 1  Copy design 1 1  Total score 29 28      03/31/2022    1:00 PM  Montreal Cognitive Assessment   Visuospatial/ Executive (0/5) 5  Naming (0/3) 3  Attention: Read list of digits (0/2) 2  Attention: Read list of letters (0/1) 1  Attention: Serial 7 subtraction starting at 100 (0/3) 3  Language: Repeat phrase (0/2) 2  Language : Fluency (0/1) 1  Abstraction (0/2) 2  Delayed Recall (0/5) 0  Orientation (0/6) 6  Total 25  Adjusted Score (based on education) 25      02/03/2023    2:12 PM 01/13/2022    9:08 AM  6CIT Screen  What Year? 0 points 0 points  What month? 0 points 0 points  What time? 0 points 0 points  Count back from 20 0 points 0 points  Months in reverse 0 points 0 points  Repeat phrase 4 points 0 points  Total Score 4 points 0 points    Immunizations Immunization History  Administered Date(s) Administered   Janssen (J&J) SARS-COV-2 Vaccination 12/26/2019, 02/26/2020   PFIZER(Purple Top)SARS-COV-2 Vaccination 02/21/2020, 03/14/2020, 10/15/2020   Td 07/25/1996   Tdap 04/08/2015    Screening Tests Health Maintenance  Topic Date Due   OPHTHALMOLOGY EXAM  Never done   Diabetic kidney evaluation - Urine ACR  Never done   Zoster Vaccines- Shingrix (1 of 2) Never done   FOOT EXAM  09/28/2023   COVID-19 Vaccine (6 - 2024-25 season) 02/13/2024 (Originally 02/13/2023)   Pneumococcal Vaccine: 50+ Years (1 of 2 - PCV) 03/02/2024 (Originally 06/13/1960)   INFLUENZA VACCINE  09/11/2024 (Originally 01/13/2024)   HEMOGLOBIN A1C  05/02/2024   Diabetic kidney evaluation - eGFR measurement  10/30/2024   Medicare Annual Wellness (AWV)  02/08/2025   DTaP/Tdap/Td (3 - Td or  Tdap) 04/07/2025   HPV VACCINES  Aged Out   Meningococcal B Vaccine  Aged Out    Health Maintenance  Health Maintenance Due  Topic Date Due   OPHTHALMOLOGY EXAM  Never done   Diabetic kidney evaluation - Urine ACR  Never done   Zoster Vaccines- Shingrix (1 of 2) Never done   FOOT EXAM  09/28/2023   Health Maintenance Items Addressed: Patient declines flu shot   Additional Screening:  Vision Screening: Recommended annual ophthalmology exams for early detection of glaucoma and other disorders of the eye. Would you like a referral to an eye doctor? No    Dental Screening: Recommended annual dental exams for proper oral hygiene  Community Resource Referral / Chronic Care Management: CRR required this visit?  No   CCM required this visit?  No   Plan:    I have personally reviewed and noted the following in the patient's chart:   Medical and social history Use of alcohol, tobacco or illicit drugs  Current medications and supplements including opioid prescriptions. Patient is not currently taking opioid prescriptions. Functional ability and status Nutritional status Physical activity Advanced directives List of other physicians Hospitalizations, surgeries, and ER visits in previous 12 months Vitals Screenings to include cognitive, depression, and falls Referrals and appointments  In addition, I have reviewed and discussed with patient certain preventive protocols, quality metrics, and best practice recommendations. A written personalized care plan for preventive services as well as general preventive health recommendations were provided to patient.   Lavelle Pfeiffer [place], [place]   [id number]/[id number]   After Visit Summary: (Pick Up) Due to this being a telephonic visit, with patients personalized plan was offered to patient and patient has requested to Pick up at office.  Notes: Nothing significant to report at this time.

## 2024-02-15 ENCOUNTER — Encounter: Payer: Self-pay | Admitting: Family Medicine

## 2024-02-15 ENCOUNTER — Ambulatory Visit: Admitting: Family Medicine

## 2024-02-15 VITALS — BP 132/68 | HR 70 | Temp 97.6°F | Resp 16 | Ht 67.0 in | Wt 201.0 lb

## 2024-02-15 DIAGNOSIS — E782 Mixed hyperlipidemia: Secondary | ICD-10-CM

## 2024-02-15 DIAGNOSIS — F0280 Dementia in other diseases classified elsewhere without behavioral disturbance: Secondary | ICD-10-CM

## 2024-02-15 DIAGNOSIS — E1169 Type 2 diabetes mellitus with other specified complication: Secondary | ICD-10-CM

## 2024-02-15 DIAGNOSIS — E1165 Type 2 diabetes mellitus with hyperglycemia: Secondary | ICD-10-CM | POA: Diagnosis not present

## 2024-02-15 DIAGNOSIS — E039 Hypothyroidism, unspecified: Secondary | ICD-10-CM | POA: Diagnosis not present

## 2024-02-15 DIAGNOSIS — G309 Alzheimer's disease, unspecified: Secondary | ICD-10-CM

## 2024-02-15 DIAGNOSIS — I119 Hypertensive heart disease without heart failure: Secondary | ICD-10-CM

## 2024-02-15 DIAGNOSIS — S42431A Displaced fracture (avulsion) of lateral epicondyle of right humerus, initial encounter for closed fracture: Secondary | ICD-10-CM | POA: Insufficient documentation

## 2024-02-15 LAB — POCT LIPID PANEL
HDL: 37
TC: <100
TRG: 94

## 2024-02-15 LAB — POCT GLYCOSYLATED HEMOGLOBIN (HGB A1C): HbA1c POC (<> result, manual entry): 5.7 % (ref 4.0–5.6)

## 2024-02-15 MED ORDER — LEVOTHYROXINE SODIUM 25 MCG PO TABS: 25.0000 ug | ORAL_TABLET | Freq: Every day | ORAL | 3 refills | Status: AC

## 2024-02-15 NOTE — Progress Notes (Signed)
 Subjective:  Patient ID: Jeffery Turner, male    DOB: [birth year]  Age: 83 y.o. MRN: [id number]  Chief Complaint  Patient presents with   Medical Management of Chronic Issues    HPI: Discussed the use of AI scribe software for clinical note transcription with the patient, who gave verbal consent to proceed.  History of Present Illness Jeffery Turner is an 83 year old male who presents for a routine follow-up visit.  Glycemic control - Discontinued metformin  for the past 2-3 months due to forgetfulness - Hemoglobin A1c improved from 6.1 to 5.7 during this period - No family history of diabetes  Cognitive impairment - On donepezil  and memantine  for memory impairment - Some improvement in memory, but relies heavily on wife for assistance - Difficulty remembering locations - Requires wife for driving  Visual disturbance - Right eye with decreased vision, possibly due to cataracts or macular degeneration - Left eye with preserved vision - Eye examination performed 3-4 months ago  Hearing loss - Hearing loss in right ear  Gastrointestinal symptoms - On pantoprazole  for acid reflux - No current heartburn  Cardiovascular history and symptoms - On Repatha  injections every two weeks for hyperlipidemia - Takes 81 mg aspirin  daily - No recent need for nitroglycerin  for chest pain - No chest pain - No history of recent fevers, chills, sweats - No engagement in physical activity due to fatigue  General well-being and nutrition - Feels well overall - Eats well but has reduced food intake over the years  Immunization status - Declines influenza vaccination - No recent vaccinations  Ent and constitutional symptoms - No earaches, sore throat, or stuffy nose       02/09/2024    3:09 PM 10/31/2023    7:43 AM 03/03/2023    8:37 AM 02/03/2023    2:09 PM 10/25/2022    9:20 AM  Depression screen PHQ 2/9  Decreased Interest 0 0 0 0 0  Down, Depressed, Hopeless 0 0 0 0 0  PHQ -  2 Score 0 0 0 0 0        02/09/2024    3:22 PM  Fall Risk   Falls in the past year? 0  Number falls in past yr: 0  Injury with Fall? 0  Risk for fall due to : Impaired balance/gait  Follow up Falls prevention discussed;Education provided;Falls evaluation completed    Patient Care Team: Sherre Clapper, MD as PCP - General (Family Medicine) Monetta Redell PARAS, MD as PCP - Cardiology (Cardiology) Russell Vina BRAVO, MD as Consulting Physician Dina Sara E, PA-C (Neurology) Richie Hussar, PhD as Consulting Physician (Psychology)   Review of Systems  Constitutional:  Positive for fatigue. Negative for appetite change and fever.  HENT:  Negative for congestion, ear pain, sinus pressure and sore throat.   Eyes: Negative.   Respiratory:  Negative for cough, chest tightness, shortness of breath and wheezing.   Cardiovascular:  Negative for chest pain and palpitations.  Gastrointestinal:  Negative for abdominal pain, constipation, diarrhea, nausea and vomiting.  Endocrine: Negative.   Genitourinary:  Negative for dysuria, frequency, hematuria and urgency.  Musculoskeletal:  Negative for arthralgias, back pain, joint swelling and myalgias.  Skin:  Negative for rash.  Allergic/Immunologic: Negative.   Neurological:  Negative for dizziness, weakness, light-headedness and headaches.  Hematological: Negative.   Psychiatric/Behavioral:  Negative for dysphoric mood. The patient is not nervous/anxious.     Current Outpatient Medications on File Prior to Visit  Medication  Sig Dispense Refill   aspirin  EC 81 MG tablet Take 1 tablet (81 mg total) by mouth daily. Swallow whole. 90 tablet 3   cyanocobalamin  (VITAMIN B12) 1000 MCG/ML injection INJECT INTRAMUSCULARLY 1 ML  EVERY 30 DAYS (DISCARD 28 DAYS  AFTER FIRST USE) 1 mL 3   donepezil  (ARICEPT ) 10 MG tablet Take 1 tablet (10 mg total) by mouth at bedtime. 90 tablet 3   Evolocumab  (REPATHA ) 140 MG/ML SOSY INJECT 140MG  UNDER THE SKIN EVERY 2 WEEKS 6 mL  3   memantine  (NAMENDA ) 10 MG tablet TAKE 1 TABLET BY MOUTH TWICE  DAILY 200 tablet 2   nitroGLYCERIN  (NITROSTAT ) 0.4 MG SL tablet Place 1 tablet (0.4 mg total) under the tongue every 5 (five) minutes as needed. If require 2, recommend call 911. 50 tablet 0   pantoprazole  (PROTONIX ) 40 MG tablet Take 1 tablet (40 mg total) by mouth daily. 90 tablet 3   SYRINGE-NEEDLE, DISP, 3 ML (LUER LOCK SAFETY SYRINGES) 25G X 1 3 ML MISC 1 each by Does not apply route once a week. 50 each 1   No current facility-administered medications on file prior to visit.   Past Medical History:  Diagnosis Date   Acoustic neuroma    Anemia    Angina pectoris 02/06/2018   Apnea 08/11/2021   Bilateral leg weakness 04/04/2021   Constant exophthalmos    right   Coronary artery disease of native artery of native heart with stable angina pectoris 07/21/2010   s/p stent to CFX 1997 2/2 MI s/p DES to mRCA and DES mLAD 07/14/10 2/2 [place]  cath 07/14/10: dLAD 60%; CFX stent ok; OM1 30-40%; pRCA 30%; PDA 40%; EF 60%   Daytime somnolence 08/11/2021   Epistaxis 07/21/2010   Hypertensive heart disease without heart failure 08/11/2021   Mild cognitive impairment of uncertain or unknown etiology 07/27/2022   Mixed hyperlipidemia 07/21/2010   Muscle cramp 01/18/2022   Old myocardial infarction 07/21/2010   Osteoarthritis 07/21/2010   PAD (peripheral artery disease) 08/11/2021   PMR (polymyalgia rheumatica) 04/03/2021   Prediabetes 08/11/2021   S/P CABG x 3 02/05/2020   Secondary hypothyroidism 02/13/2021   Sensorineural hearing loss, bilateral    Shortness of breath 07/21/2010   Statin myopathy 04/04/2021   Transient visual loss, right    wears glasses   Ulnar neuropathy of both upper extremities 04/20/2022   Upper respiratory tract infection due to COVID-19 virus 05/14/2021   Vitamin B12 deficiency 07/04/2023   Past Surgical History:  Procedure Laterality Date   CARDIAC CATHETERIZATION     CORONARY ARTERY BYPASS GRAFT  N/A 02/05/2020   Procedure: CORONARY ARTERY BYPASS GRAFTING (CABG), ON PUMP, TIMES THREE, USING LEFT INTERNAL MAMMARY ARTERY TO LEFT ANTERIOR DESCENDING ARTERY, REVERSE SAPHENOUS VEIN GRAFT TO POSTERIOR DESCENDING ARTERY AND CIRCUMFLEX ARTERY;  Surgeon: Army Dallas NOVAK, MD;  Location: [facility] OR;  Service: Open Heart Surgery;  Laterality: N/A;   ENDOVEIN HARVEST OF GREATER SAPHENOUS VEIN Right 02/05/2020   Procedure: ENDOVEIN HARVEST OF GREATER SAPHENOUS VEIN;  Surgeon: Army Dallas NOVAK, MD;  Location: [facility] OR;  Service: Open Heart Surgery;  Laterality: Right;   LEFT HEART CATH AND CORONARY ANGIOGRAPHY N/A 01/25/2020   Procedure: LEFT HEART CATH AND CORONARY ANGIOGRAPHY;  Surgeon: Anner Alm ORN, MD;  Location: [facility] INVASIVE CV LAB;  Service: Cardiovascular;  Laterality: N/A;   TEE WITHOUT CARDIOVERSION N/A 02/05/2020   Procedure: TRANSESOPHAGEAL ECHOCARDIOGRAM (TEE);  Surgeon: Army Dallas NOVAK, MD;  Location: [facility] OR;  Service: Open Heart Surgery;  Laterality:  N/A;   TONSILLECTOMY AND ADENOIDECTOMY     WISDOM TOOTH EXTRACTION      Family History  Problem Relation Age of Onset   Heart failure Mother    Alzheimer's disease Mother    Dementia Mother    Dementia Sister    Alzheimer's disease Sister    Memory loss Brother    Coronary artery disease Other    Social History   Socioeconomic History   Marital status: Married    Spouse name: Not on file   Number of children: 2   Years of education: 14   Highest education level: Some college, no degree  Occupational History   Occupation: Retired    Comment: Holiday representative  Tobacco Use   Smoking status: Former    Current packs/day: 0.00    Average packs/day: 1.5 packs/day for 37.0 years (55.5 ttl pk-yrs)    Types: Cigarettes    Start date: 05/1963    Quit date: 05/2000    Years since quitting: 23.8   Smokeless tobacco: Never  Vaping Use   Vaping status: Never Used  Substance and Sexual Activity   Alcohol use: No   Drug use: No   Sexual  activity: Not Currently  Other Topics Concern   Not on file  Social History Narrative   He lives in [place] with family.  He is a retired  Museum/gallery exhibitions officer.  He quit tobacco in 1997 with approximately 30-pack-  year history and denies alcohol or drug abuse.   Right handed   Drinks caffeine   Two story home      Social Drivers of Health   Financial Resource Strain: Low Risk  (02/09/2024)   Overall Financial Resource Strain (CARDIA)    Difficulty of Paying Living Expenses: Not hard at all  Food Insecurity: No Food Insecurity (02/09/2024)   Hunger Vital Sign    Worried About Running Out of Food in the Last Year: Never true    Ran Out of Food in the Last Year: Never true  Transportation Needs: No Transportation Needs (02/09/2024)   PRAPARE - Administrator, Civil Service (Medical): No    Lack of Transportation (Non-Medical): No  Physical Activity: Inactive (02/09/2024)   Exercise Vital Sign    Days of Exercise per Week: 0 days    Minutes of Exercise per Session: 0 min  Stress: No Stress Concern Present (02/09/2024)   [employer] of Occupational Health - Occupational Stress Questionnaire    Feeling of Stress: Not at all  Social Connections: Moderately Isolated (02/09/2024)   Social Connection and Isolation Panel    Frequency of Communication with Friends and Family: More than three times a week    Frequency of Social Gatherings with Friends and Family: More than three times a week    Attends Religious Services: Never    Database administrator or Organizations: No    Attends Engineer, structural: Never    Marital Status: Married    Objective:  BP 132/68   Pulse 70   Temp 97.6 F (36.4 C) (Temporal)   Resp 16   Ht 5' 7 (1.702 m)   Wt 201 lb (91.2 kg)   SpO2 100%   BMI 31.48 kg/m      02/15/2024    7:56 AM 02/09/2024    3:07 PM 10/31/2023    7:41 AM  BP/Weight  Systolic BP 132 -- 116  Diastolic BP 68 -- 80  Wt. (Lbs) 201 203 203.6  BMI 31.48  kg/m2  31.79 kg/m2 31.89 kg/m2    Physical Exam Vitals reviewed.  Constitutional:      Appearance: Normal appearance.  Neck:     Vascular: No carotid bruit.  Cardiovascular:     Rate and Rhythm: Normal rate and regular rhythm.     Pulses: Normal pulses.     Heart sounds: Normal heart sounds.  Pulmonary:     Effort: Pulmonary effort is normal.     Breath sounds: Normal breath sounds. No wheezing, rhonchi or rales.  Abdominal:     General: Bowel sounds are normal.     Palpations: Abdomen is soft.     Tenderness: There is no abdominal tenderness.  Neurological:     Mental Status: He is alert and oriented to person, place, and time.  Psychiatric:        Mood and Affect: Mood normal.        Behavior: Behavior normal.      Diabetic foot exam was performed with the following findings:   No deformities, ulcerations, or other skin breakdown Normal sensation of 10g monofilament Intact posterior tibialis and dorsalis pedis pulses      Lab Results  Component Value Date   WBC 5.7 10/31/2023   HGB 14.1 10/31/2023   HCT 43.2 10/31/2023   PLT 261 10/31/2023   GLUCOSE 95 10/31/2023   CHOL 78 (L) 10/31/2023   TRIG 115 10/31/2023   HDL 29 (L) 10/31/2023   LDLDIRECT 152.5 10/05/2010   LDLCALC 28 10/31/2023   ALT 14 10/31/2023   AST 17 10/31/2023   NA 136 10/31/2023   K 4.6 10/31/2023   CL 100 10/31/2023   CREATININE 1.07 10/31/2023   BUN 19 10/31/2023   CO2 21 10/31/2023   TSH 1.630 10/31/2023   INR 1.4 (H) 02/05/2020   HGBA1C 5.7 02/15/2024      Component Ref Range & Units (hover) 10 d ago (02/15/24)  TC <100  HDL 37  TRG 94  LDL n/a  Non-HDL n/a       Assessment & Plan:  Hypertensive heart disease without heart failure Assessment & Plan: Well controlled.  Continue to work on eating a healthy diet and exercise.  The current medical regimen is effective;  continue present plan with Nitroglycerin , Aspirin  81mg         Mixed hyperlipidemia Assessment & Plan: Well  controlled.  Continue to work on eating a healthy diet and exercise.  The current medical regimen is effective;  continue present plan with Repatha  140mg  every 2 weeks    Orders: -     POCT Lipid Panel  Type 2 diabetes mellitus with hyperglycemia, without long-term current use of insulin  (HCC) Assessment & Plan: Well controlled off metformin .  Recommend continue to work on eating healthy diet and exercise. No medicines at this time.   Orders: -     POCT glycosylated hemoglobin (Hb A1C) -     Microalbumin / creatinine urine ratio  Hypothyroidism, unspecified type Assessment & Plan: Well controlled Continue Synthroid  at current dose   Orders: -     Levothyroxine  Sodium; Take 1 tablet (25 mcg total) by mouth daily before breakfast.  Dispense: 90 tablet; Refill: 3  Dementia due to Alzheimer's disease [facility]) Assessment & Plan: Some improvement in memory. He is on donepezil  and memantine , which may be contributing to memory improvement. - Continue donepezil  and memantine .   Combined hyperlipidemia associated with type 2 diabetes mellitus (HCC) Assessment & Plan: Well controlled off metformin .  Recommend continue to  work on eating healthy diet and exercise. No medicines at this time.        Meds ordered this encounter  Medications   levothyroxine  (SYNTHROID ) 25 MCG tablet    Sig: Take 1 tablet (25 mcg total) by mouth daily before breakfast.    Dispense:  90 tablet    Refill:  3    Orders Placed This Encounter  Procedures   Microalbumin / creatinine urine ratio   POCT glycosylated hemoglobin (Hb A1C)   POCT Lipid Panel   [facility] DIABETES EYE EXAM     Follow-up: Return in about 4 months (around 06/16/2024) for chronic follow up.   An After Visit Summary was printed and given to the patient.  Abigail Free, MD [facility] [phone]

## 2024-02-16 ENCOUNTER — Ambulatory Visit: Payer: Self-pay | Admitting: Family Medicine

## 2024-02-16 LAB — MICROALBUMIN / CREATININE URINE RATIO
Creatinine, Urine: 62.9 mg/dL
Microalb/Creat Ratio: 12 mg/g{creat} (ref 0–29)
Microalbumin, Urine: 7.3 ug/mL

## 2024-02-25 ENCOUNTER — Encounter: Payer: Self-pay | Admitting: Family Medicine

## 2024-02-25 NOTE — Assessment & Plan Note (Signed)
 Well controlled.  Continue to work on eating a healthy diet and exercise.  The current medical regimen is effective;  continue present plan with Repatha  140mg  every 2 weeks

## 2024-02-25 NOTE — Assessment & Plan Note (Addendum)
 Diabetes and lipids well controlled. Recommend continue to work on eating healthy diet and exercise. Continue repatha . Remain off metformin .

## 2024-02-25 NOTE — Assessment & Plan Note (Signed)
 Some improvement in memory. He is on donepezil  and memantine , which may be contributing to memory improvement. - Continue donepezil  and memantine .

## 2024-02-25 NOTE — Assessment & Plan Note (Signed)
Well controlled Continue Synthroid at current dose

## 2024-02-25 NOTE — Assessment & Plan Note (Signed)
 Well controlled.  Continue to work on eating a healthy diet and exercise.  The current medical regimen is effective;  continue present plan with Nitroglycerin , Aspirin  81mg 

## 2024-05-06 ENCOUNTER — Other Ambulatory Visit: Payer: Self-pay | Admitting: Family Medicine

## 2024-05-06 ENCOUNTER — Other Ambulatory Visit: Payer: Self-pay

## 2024-05-06 DIAGNOSIS — R413 Other amnesia: Secondary | ICD-10-CM

## 2024-05-06 DIAGNOSIS — K219 Gastro-esophageal reflux disease without esophagitis: Secondary | ICD-10-CM

## 2024-05-06 DIAGNOSIS — I25118 Atherosclerotic heart disease of native coronary artery with other forms of angina pectoris: Secondary | ICD-10-CM

## 2024-05-06 DIAGNOSIS — E782 Mixed hyperlipidemia: Secondary | ICD-10-CM

## 2024-05-25 ENCOUNTER — Other Ambulatory Visit: Payer: Self-pay | Admitting: Family Medicine

## 2024-06-20 ENCOUNTER — Ambulatory Visit: Admitting: Family Medicine

## 2024-06-20 ENCOUNTER — Ambulatory Visit: Payer: Self-pay | Admitting: Family Medicine

## 2024-06-20 ENCOUNTER — Encounter: Payer: Self-pay | Admitting: Family Medicine

## 2024-06-20 VITALS — BP 124/74 | HR 67 | Temp 97.8°F | Ht 67.0 in | Wt 219.0 lb

## 2024-06-20 DIAGNOSIS — G720 Drug-induced myopathy: Secondary | ICD-10-CM

## 2024-06-20 DIAGNOSIS — T466X5A Adverse effect of antihyperlipidemic and antiarteriosclerotic drugs, initial encounter: Secondary | ICD-10-CM

## 2024-06-20 DIAGNOSIS — M170 Bilateral primary osteoarthritis of knee: Secondary | ICD-10-CM

## 2024-06-20 DIAGNOSIS — W19XXXA Unspecified fall, initial encounter: Secondary | ICD-10-CM

## 2024-06-20 DIAGNOSIS — F0280 Dementia in other diseases classified elsewhere without behavioral disturbance: Secondary | ICD-10-CM

## 2024-06-20 DIAGNOSIS — I25118 Atherosclerotic heart disease of native coronary artery with other forms of angina pectoris: Secondary | ICD-10-CM

## 2024-06-20 DIAGNOSIS — E782 Mixed hyperlipidemia: Secondary | ICD-10-CM

## 2024-06-20 DIAGNOSIS — E038 Other specified hypothyroidism: Secondary | ICD-10-CM

## 2024-06-20 DIAGNOSIS — G309 Alzheimer's disease, unspecified: Secondary | ICD-10-CM | POA: Diagnosis not present

## 2024-06-20 DIAGNOSIS — R7303 Prediabetes: Secondary | ICD-10-CM | POA: Diagnosis not present

## 2024-06-20 DIAGNOSIS — I119 Hypertensive heart disease without heart failure: Secondary | ICD-10-CM

## 2024-06-20 LAB — POCT GLYCOSYLATED HEMOGLOBIN (HGB A1C): HbA1c POC (<> result, manual entry): 5.5 %

## 2024-06-20 NOTE — Assessment & Plan Note (Addendum)
 Hemoglobin A1c 6.2%, 3 month avg of blood sugars, is in prediabetic range.  In order to prevent progression to diabetes, recommend low carb diet and regular exercise  Hold metformin .  Orders:   POCT glycosylated hemoglobin (Hb A1C)   Microalbumin / creatinine urine ratio

## 2024-06-20 NOTE — Assessment & Plan Note (Addendum)
 Management per specialist. Coronary artery disease managed with Repatha  and nitroglycerin . - Continue Repatha  and nitroglycerin .

## 2024-06-20 NOTE — Assessment & Plan Note (Addendum)
 Intolerant to statin.

## 2024-06-20 NOTE — Assessment & Plan Note (Addendum)
 Well controlled.  Continue to work on eating a healthy diet and exercise.  The current medical regimen is effective;  continue present plan with Nitroglycerin , Aspirin  81mg  daily.

## 2024-06-20 NOTE — Progress Notes (Unsigned)
 "  Subjective:  Patient ID: Jeffery Turner, male    DOB: [birth year]  Age: 84 y.o. MRN: [id number]  Chief Complaint  Patient presents with   Medical Management of Chronic Issues    HPI: Discussed the use of AI scribe software for clinical note transcription with the patient, who gave verbal consent to proceed.  History of Present Illness Jeffery Turner is an 84 year old male with dementia who presents for a follow-up visit regarding mobility issues and memory concerns. He is accompanied by his wife, who is his primary caregiver.  Mobility impairment - Difficulty with mobility, especially when getting in and out of the bathtub - Impairment primarily affects lower extremities, from knees down - Knees feel unstable, causing fear of falling - No falls in the past year except for one incident tripping over a wheelbarrow in the yard, attributed to poor visibility in the dark - Has not needed walking canes in the past few days  Cognitive decline - Memory impairment with inability to recall information told to him ten minutes prior - Worsening memory issues as observed by his wife - Currently taking Aricept  and Namenda  for memory support - Significant memory decline following eight-hour surgery under anesthesia approximately forty years ago, with gradual improvement since then - Family history of Alzheimer's disease on maternal side  Dyspnea - Shortness of breath with minimal exertion  Sciatic nerve pain - History of sciatic nerve problems in early forties - Managed with glucosamine for many years, but no longer takes it regularly  Medication and allergies impacting symptoms - Takes aspirin  81 mg daily, B12 injections monthly, Repatha  for cholesterol and heart disease, nitroglycerin , levothyroxine  25 mcg daily, and Protonix  40 mg daily - Avoids tomatoes and Irish potatoes, which he avoids to prevent joint pain       02/09/2024    3:09 PM 10/31/2023    7:43 AM 03/03/2023    8:37 AM  02/03/2023    2:09 PM 10/25/2022    9:20 AM  Depression screen PHQ 2/9  Decreased Interest 0 0 0 0 0  Down, Depressed, Hopeless 0 0 0 0 0  PHQ - 2 Score 0 0 0 0 0        02/09/2024    3:22 PM  Fall Risk   Falls in the past year? 0  Number falls in past yr: 0  Injury with Fall? 0   Risk for fall due to : Impaired balance/gait  Follow up Falls prevention discussed;Education provided;Falls evaluation completed     Data saved with a previous flowsheet row definition    Patient Care Team: Sherre Clapper, MD as PCP - General (Family Medicine) Monetta Redell PARAS, MD as PCP - Cardiology (Cardiology) Russell, Vina BRAVO, MD as Consulting Physician Dina Sara E, PA-C (Neurology) Richie Hussar, PhD as Consulting Physician (Psychology)   Review of Systems  All other systems reviewed and are negative.   Medications Ordered Prior to Encounter[1] Past Medical History:  Diagnosis Date   Acoustic neuroma    Anemia    Angina pectoris 02/06/2018   Apnea 08/11/2021   Bilateral leg weakness 04/04/2021   Constant exophthalmos    right   Coronary artery disease of native artery of native heart with stable angina pectoris 07/21/2010   s/p stent to CFX 1997 2/2 MI s/p DES to [facility] and DES mLAD 07/14/10 2/2 [place]  cath 07/14/10: dLAD 60%; CFX stent ok; OM1 30-40%; pRCA 30%; PDA 40%; EF 60%   Daytime somnolence  08/11/2021   Epistaxis 07/21/2010   Hypertensive heart disease without heart failure 08/11/2021   Mild cognitive impairment of uncertain or unknown etiology 07/27/2022   Mixed hyperlipidemia 07/21/2010   Muscle cramp 01/18/2022   Old myocardial infarction 07/21/2010   Osteoarthritis 07/21/2010   PAD (peripheral artery disease) 08/11/2021   PMR (polymyalgia rheumatica) 04/03/2021   Prediabetes 08/11/2021   S/P CABG x 3 02/05/2020   Secondary hypothyroidism 02/13/2021   Sensorineural hearing loss, bilateral    Shortness of breath 07/21/2010   Statin myopathy 04/04/2021   Transient visual loss,  right    wears glasses   Ulnar neuropathy of both upper extremities 04/20/2022   Upper respiratory tract infection due to COVID-19 virus 05/14/2021   Vitamin B12 deficiency 07/04/2023   Past Surgical History:  Procedure Laterality Date   CARDIAC CATHETERIZATION     CORONARY ARTERY BYPASS GRAFT N/A 02/05/2020   Procedure: CORONARY ARTERY BYPASS GRAFTING (CABG), ON PUMP, TIMES THREE, USING LEFT INTERNAL MAMMARY ARTERY TO LEFT ANTERIOR DESCENDING ARTERY, REVERSE SAPHENOUS VEIN GRAFT TO POSTERIOR DESCENDING ARTERY AND CIRCUMFLEX ARTERY;  Surgeon: Army Dallas NOVAK, MD;  Location: [facility] OR;  Service: Open Heart Surgery;  Laterality: N/A;   ENDOVEIN HARVEST OF GREATER SAPHENOUS VEIN Right 02/05/2020   Procedure: ENDOVEIN HARVEST OF GREATER SAPHENOUS VEIN;  Surgeon: Army Dallas NOVAK, MD;  Location: [facility] OR;  Service: Open Heart Surgery;  Laterality: Right;   LEFT HEART CATH AND CORONARY ANGIOGRAPHY N/A 01/25/2020   Procedure: LEFT HEART CATH AND CORONARY ANGIOGRAPHY;  Surgeon: Anner Alm ORN, MD;  Location: [facility] INVASIVE CV LAB;  Service: Cardiovascular;  Laterality: N/A;   TEE WITHOUT CARDIOVERSION N/A 02/05/2020   Procedure: TRANSESOPHAGEAL ECHOCARDIOGRAM (TEE);  Surgeon: Army Dallas NOVAK, MD;  Location: [facility] OR;  Service: Open Heart Surgery;  Laterality: N/A;   TONSILLECTOMY AND ADENOIDECTOMY     WISDOM TOOTH EXTRACTION      Family History  Problem Relation Age of Onset   Heart failure Mother    Alzheimer's disease Mother    Dementia Mother    Dementia Sister    Alzheimer's disease Sister    Memory loss Brother    Coronary artery disease Other    Social History   Socioeconomic History   Marital status: Married    Spouse name: Not on file   Number of children: 2   Years of education: 14   Highest education level: Some college, no degree  Occupational History   Occupation: Retired    Comment: Holiday Representative  Tobacco Use   Smoking status: Former    Current packs/day: 0.00    Average  packs/day: 1.5 packs/day for 37.0 years (55.5 ttl pk-yrs)    Types: Cigarettes    Start date: 05/1963    Quit date: 05/2000    Years since quitting: 24.1   Smokeless tobacco: Never  Vaping Use   Vaping status: Never Used  Substance and Sexual Activity   Alcohol use: No   Drug use: No   Sexual activity: Not Currently  Other Topics Concern   Not on file  Social History Narrative   He lives in [place] with family.  He is a retired  museum/gallery exhibitions officer.  He quit tobacco in 1997 with approximately 30-pack-  year history and denies alcohol or drug abuse.   Right handed   Drinks caffeine   Two story home      Social Drivers of Health   Tobacco Use: Medium Risk (06/20/2024)   Patient History    Smoking  Tobacco Use: Former    Smokeless Tobacco Use: Never    Passive Exposure: Not on Actuary Strain: Low Risk (02/09/2024)   Overall Financial Resource Strain (CARDIA)    Difficulty of Paying Living Expenses: Not hard at all  Food Insecurity: No Food Insecurity (06/20/2024)   Epic    Worried About Programme Researcher, Broadcasting/film/video in the Last Year: Never true    Ran Out of Food in the Last Year: Never true  Transportation Needs: No Transportation Needs (02/09/2024)   Epic    Lack of Transportation (Medical): No    Lack of Transportation (Non-Medical): No  Physical Activity: Inactive (02/09/2024)   Exercise Vital Sign    Days of Exercise per Week: 0 days    Minutes of Exercise per Session: 0 min  Stress: No Stress Concern Present (02/09/2024)   [employer] of Occupational Health - Occupational Stress Questionnaire    Feeling of Stress: Not at all  Social Connections: Moderately Isolated (02/09/2024)   Social Connection and Isolation Panel    Frequency of Communication with Friends and Family: More than three times a week    Frequency of Social Gatherings with Friends and Family: More than three times a week    Attends Religious Services: Never    Database Administrator or  Organizations: No    Attends Banker Meetings: Never    Marital Status: Married  Depression (PHQ2-9): Low Risk (02/09/2024)   Depression (PHQ2-9)    PHQ-2 Score: 0  Alcohol Screen: Low Risk (02/09/2024)   Alcohol Screen    Last Alcohol Screening Score (AUDIT): 0  Housing: Low Risk (06/20/2024)   Epic    Unable to Pay for Housing in the Last Year: No    Number of Times Moved in the Last Year: 0    Homeless in the Last Year: No  Utilities: Not At Risk (02/09/2024)   Epic    Threatened with loss of utilities: No  Health Literacy: Adequate Health Literacy (02/09/2024)   B1300 Health Literacy    Frequency of need for help with medical instructions: Never    Objective:  BP 124/74   Pulse 67   Temp 97.8 F (36.6 C)   Ht 5' 7 (1.702 m)   Wt 219 lb (99.3 kg)   SpO2 93%   BMI 34.30 kg/m      06/20/2024    8:45 AM 02/15/2024    7:56 AM 02/09/2024    3:07 PM  BP/Weight  Systolic BP 124 132 --  Diastolic BP 74 68 --  Wt. (Lbs) 219 201 203  BMI 34.3 kg/m2 31.48 kg/m2 31.79 kg/m2    Physical Exam Vitals reviewed.  Constitutional:      Appearance: Normal appearance.  Neck:     Vascular: No carotid bruit.  Cardiovascular:     Rate and Rhythm: Normal rate and regular rhythm.     Heart sounds: Normal heart sounds.  Pulmonary:     Effort: Pulmonary effort is normal.     Breath sounds: Normal breath sounds. No wheezing, rhonchi or rales.  Abdominal:     General: Bowel sounds are normal.     Palpations: Abdomen is soft.     Tenderness: There is no abdominal tenderness.  Neurological:     Mental Status: He is alert.  Psychiatric:        Mood and Affect: Mood normal.        Behavior: Behavior normal.  Lab Results  Component Value Date   WBC 4.9 06/20/2024   HGB 14.4 06/20/2024   HCT 42.4 06/20/2024   PLT 257 06/20/2024   GLUCOSE 91 06/20/2024   CHOL 78 (L) 10/31/2023   TRIG 115 10/31/2023   HDL 29 (L) 10/31/2023   LDLDIRECT 152.5 10/05/2010    LDLCALC 28 10/31/2023   ALT 14 06/20/2024   AST 14 06/20/2024   NA 140 06/20/2024   K 4.9 06/20/2024   CL 102 06/20/2024   CREATININE 0.96 06/20/2024   BUN 18 06/20/2024   CO2 23 06/20/2024   TSH 1.630 10/31/2023   INR 1.4 (H) 02/05/2020   HGBA1C 5.5 06/20/2024    Results for orders placed or performed in visit on 06/20/24  POCT glycosylated hemoglobin (Hb A1C)   Collection Time: 06/20/24  9:14 AM  Result Value Ref Range   Hemoglobin A1C     HbA1c POC (<> result, manual entry) 5.5 4.0 - 5.6 %   HbA1c, POC (prediabetic range)     HbA1c, POC (controlled diabetic range)    CBC with Differential/Platelet   Collection Time: 06/20/24  9:21 AM  Result Value Ref Range   WBC 4.9 3.4 - 10.8 x10E3/uL   RBC 4.67 4.14 - 5.80 x10E6/uL   Hemoglobin 14.4 13.0 - 17.7 g/dL   Hematocrit 57.5 62.4 - 51.0 %   MCV 91 79 - 97 fL   MCH 30.8 26.6 - 33.0 pg   MCHC 34.0 31.5 - 35.7 g/dL   RDW 87.4 88.3 - 84.5 %   Platelets 257 150 - 450 x10E3/uL   Neutrophils 55 Not Estab. %   Lymphs 25 Not Estab. %   Monocytes 15 Not Estab. %   Eos 3 Not Estab. %   Basos 1 Not Estab. %   Neutrophils Absolute 2.7 1.4 - 7.0 x10E3/uL   Lymphocytes Absolute 1.3 0.7 - 3.1 x10E3/uL   Monocytes Absolute 0.8 0.1 - 0.9 x10E3/uL   EOS (ABSOLUTE) 0.1 0.0 - 0.4 x10E3/uL   Basophils Absolute 0.0 0.0 - 0.2 x10E3/uL   Immature Granulocytes 0 Not Estab. %   Immature Grans (Abs) 0.0 0.0 - 0.1 x10E3/uL  Comprehensive metabolic panel with GFR   Collection Time: 06/20/24  9:21 AM  Result Value Ref Range   Glucose 91 70 - 99 mg/dL   BUN 18 8 - 27 mg/dL   Creatinine, Ser 9.03 0.76 - 1.27 mg/dL   eGFR 78 >40 fO/fpw/8.26   BUN/Creatinine Ratio 19 10 - 24   Sodium 140 134 - 144 mmol/L   Potassium 4.9 3.5 - 5.2 mmol/L   Chloride 102 96 - 106 mmol/L   CO2 23 20 - 29 mmol/L   Calcium  9.2 8.6 - 10.2 mg/dL   Total Protein 6.8 6.0 - 8.5 g/dL   Albumin  4.4 3.7 - 4.7 g/dL   Globulin, Total 2.4 1.5 - 4.5 g/dL   Bilirubin Total 0.5  0.0 - 1.2 mg/dL   Alkaline Phosphatase 66 48 - 129 IU/L   AST 14 0 - 40 IU/L   ALT 14 0 - 44 IU/L  Microalbumin / creatinine urine ratio   Collection Time: 06/20/24  9:26 AM  Result Value Ref Range   Creatinine, Urine 41.7 Not Estab. mg/dL   Microalbumin, Urine 3.7 Not Estab. ug/mL   Microalb/Creat Ratio 9 0 - 29 mg/g creat  .  Assessment & Plan:   Assessment & Plan Mixed hyperlipidemia Managed with Repatha . - Continue Repatha . Orders:   CBC with Differential/Platelet  Comprehensive metabolic panel with GFR  Prediabetes Hemoglobin A1c 6.2%, 3 month avg of blood sugars, is in prediabetic range.  In order to prevent progression to diabetes, recommend low carb diet and regular exercise  Hold metformin .  Orders:   POCT glycosylated hemoglobin (Hb A1C)   Microalbumin / creatinine urine ratio   Coronary artery disease of native artery of native heart with stable angina pectoris Management per specialist. Coronary artery disease managed with Repatha  and nitroglycerin . - Continue Repatha  and nitroglycerin .      Primary osteoarthritis of both knees Chronic osteoarthritis with instability and fear of falling, no significant pain but mobility issues. - Wrote a letter for the [employer] to support the request for a bathroom remodel to include a walk-in shower.    Other specified hypothyroidism Hypothyroidism managed with levothyroxine  25 mcg daily. - Continue levothyroxine  25 mcg daily.    Statin myopathy Intolerant to statin.    Hypertensive heart disease without heart failure Well controlled.  Continue to work on eating a healthy diet and exercise.  The current medical regimen is effective;  continue present plan with Nitroglycerin , Aspirin  81mg  daily.     Dementia due to Alzheimer's disease (HCC) Some improvement in memory. He is on donepezil  and memantine , which may be contributing to memory improvement. - Continue donepezil  and memantine .     Fall, initial encounter -  Wrote a physicist, medical for the [employer] to support the request for a bathroom remodel to include a walk-in shower.     Body mass index is 34.3 kg/m.    No orders of the defined types were placed in this encounter.   Orders Placed This Encounter  Procedures   Microalbumin / creatinine urine ratio   CBC with Differential/Platelet   Comprehensive metabolic panel with GFR   POCT glycosylated hemoglobin (Hb A1C)    I,Marla I Leal-Borjas,acting as a scribe for Abigail Free, MD.,have documented all relevant documentation on the behalf of Abigail Free, MD,as directed by  Abigail Free, MD while in the presence of Abigail Free, MD.   Follow-up: Return in about 6 months (around 12/18/2024).  An After Visit Summary was printed and given to the patient.  I attest that I have reviewed this visit and agree with the plan scribed by my staff.   Abigail Free, MD [facility] [phone]       [1]  Current Outpatient Medications on File Prior to Visit  Medication Sig Dispense Refill   aspirin  EC 81 MG tablet Take 1 tablet (81 mg total) by mouth daily. Swallow whole. 90 tablet 3   cyanocobalamin  (VITAMIN B12) 1000 MCG/ML injection INJECT INTRAMUSCULARLY 1ML EVERY 30 DAYS (DISCARD 28 DAYS AFTER  FIRST USE) 1 mL 3   donepezil  (ARICEPT ) 10 MG tablet TAKE 1 TABLET BY MOUTH AT  BEDTIME 100 tablet 2   Evolocumab  (REPATHA ) 140 MG/ML SOSY INJECT 1 SYRINGE SUBCUTANEOUSLY  EVERY 2 WEEKS 7 mL 2   levothyroxine  (SYNTHROID ) 25 MCG tablet Take 1 tablet (25 mcg total) by mouth daily before breakfast. 90 tablet 3   memantine  (NAMENDA ) 10 MG tablet TAKE 1 TABLET BY MOUTH TWICE  DAILY 200 tablet 2   nitroGLYCERIN  (NITROSTAT ) 0.4 MG SL tablet Place 1 tablet (0.4 mg total) under the tongue every 5 (five) minutes as needed. If require 2, recommend call 911. 50 tablet 0   pantoprazole  (PROTONIX ) 40 MG tablet TAKE 1 TABLET BY MOUTH DAILY 100 tablet 2   SYRINGE-NEEDLE, DISP, 3 ML (LUER LOCK SAFETY SYRINGES) 25G  X 1 3 ML  MISC 1 each by Does not apply route once a week. 50 each 1   No current facility-administered medications on file prior to visit.   "

## 2024-06-20 NOTE — Assessment & Plan Note (Addendum)
 Chronic osteoarthritis with instability and fear of falling, no significant pain but mobility issues. - Wrote a letter for the [employer] to support the request for a bathroom remodel to include a walk-in shower.

## 2024-06-20 NOTE — Assessment & Plan Note (Addendum)
-   Wrote a letter for the [employer] to support the request for a bathroom remodel to include a walk-in shower.

## 2024-06-20 NOTE — Assessment & Plan Note (Addendum)
 Managed with Repatha . - Continue Repatha . Orders:   CBC with Differential/Platelet   Comprehensive metabolic panel with GFR

## 2024-06-20 NOTE — Assessment & Plan Note (Addendum)
 Some improvement in memory. He is on donepezil  and memantine , which may be contributing to memory improvement. - Continue donepezil  and memantine .

## 2024-06-20 NOTE — Assessment & Plan Note (Addendum)
 Hypothyroidism managed with levothyroxine  25 mcg daily. - Continue levothyroxine  25 mcg daily.

## 2024-06-20 NOTE — Patient Instructions (Signed)
" °  VISIT SUMMARY: Today, you had a follow-up visit to discuss your mobility issues and memory concerns. We reviewed your current medications and discussed your ongoing health conditions, including osteoarthritis, Alzheimer's dementia, coronary artery disease, and hypothyroidism.  YOUR PLAN: PRIMARY OSTEOARTHRITIS OF BOTH KNEES: You have chronic osteoarthritis in your knees, which causes instability and fear of falling. -[facility] to [facility] your request for a bathroom remodel to include a walk-in shower.  ALZHEIMER'S DEMENTIA: You have progressive memory decline consistent with Alzheimer's dementia. -Continue taking Aricept  and Namenda  as prescribed.  CORONARY ARTERY DISEASE WITH STABLE ANGINA: You have coronary artery disease, which is being managed with your current medications. -Continue taking Repatha  and nitroglycerin  as prescribed.  MIXED HYPERLIPIDEMIA: You have mixed hyperlipidemia, which is being managed with your current medication. -Continue taking Repatha  as prescribed.  OTHER SPECIFIED HYPOTHYROIDISM: You have hypothyroidism, which is being managed with your current medication. -Continue taking levothyroxine  25 mcg daily as prescribed.  GENERAL HEALTH MAINTENANCE: We are keeping track of your overall health. -I have ordered additional blood work to monitor your health.                      Contains text generated by Abridge.                                 Contains text generated by Abridge.   "

## 2024-06-21 LAB — CBC WITH DIFFERENTIAL/PLATELET
Basophils Absolute: 0 x10E3/uL (ref 0.0–0.2)
Basos: 1 %
EOS (ABSOLUTE): 0.1 x10E3/uL (ref 0.0–0.4)
Eos: 3 %
Hematocrit: 42.4 % (ref 37.5–51.0)
Hemoglobin: 14.4 g/dL (ref 13.0–17.7)
Immature Grans (Abs): 0 x10E3/uL (ref 0.0–0.1)
Immature Granulocytes: 0 %
Lymphocytes Absolute: 1.3 x10E3/uL (ref 0.7–3.1)
Lymphs: 25 %
MCH: 30.8 pg (ref 26.6–33.0)
MCHC: 34 g/dL (ref 31.5–35.7)
MCV: 91 fL (ref 79–97)
Monocytes Absolute: 0.8 x10E3/uL (ref 0.1–0.9)
Monocytes: 15 %
Neutrophils Absolute: 2.7 x10E3/uL (ref 1.4–7.0)
Neutrophils: 55 %
Platelets: 257 x10E3/uL (ref 150–450)
RBC: 4.67 x10E6/uL (ref 4.14–5.80)
RDW: 12.5 % (ref 11.6–15.4)
WBC: 4.9 x10E3/uL (ref 3.4–10.8)

## 2024-06-21 LAB — COMPREHENSIVE METABOLIC PANEL WITH GFR
ALT: 14 IU/L (ref 0–44)
AST: 14 IU/L (ref 0–40)
Albumin: 4.4 g/dL (ref 3.7–4.7)
Alkaline Phosphatase: 66 IU/L (ref 48–129)
BUN/Creatinine Ratio: 19 (ref 10–24)
BUN: 18 mg/dL (ref 8–27)
Bilirubin Total: 0.5 mg/dL (ref 0.0–1.2)
CO2: 23 mmol/L (ref 20–29)
Calcium: 9.2 mg/dL (ref 8.6–10.2)
Chloride: 102 mmol/L (ref 96–106)
Creatinine, Ser: 0.96 mg/dL (ref 0.76–1.27)
Globulin, Total: 2.4 g/dL (ref 1.5–4.5)
Glucose: 91 mg/dL (ref 70–99)
Potassium: 4.9 mmol/L (ref 3.5–5.2)
Sodium: 140 mmol/L (ref 134–144)
Total Protein: 6.8 g/dL (ref 6.0–8.5)
eGFR: 78 mL/min/1.73

## 2024-06-21 LAB — MICROALBUMIN / CREATININE URINE RATIO
Creatinine, Urine: 41.7 mg/dL
Microalb/Creat Ratio: 9 mg/g{creat} (ref 0–29)
Microalbumin, Urine: 3.7 ug/mL

## 2024-06-25 ENCOUNTER — Other Ambulatory Visit: Payer: Self-pay | Admitting: Family Medicine

## 2024-06-25 DIAGNOSIS — E782 Mixed hyperlipidemia: Secondary | ICD-10-CM

## 2024-06-25 DIAGNOSIS — I25118 Atherosclerotic heart disease of native coronary artery with other forms of angina pectoris: Secondary | ICD-10-CM

## 2024-06-25 MED ORDER — REPATHA 140 MG/ML ~~LOC~~ SOSY: PREFILLED_SYRINGE | SUBCUTANEOUS | 2 refills | Status: DC

## 2024-06-25 NOTE — Telephone Encounter (Signed)
 Copied from CRM [id number]. Topic: Clinical - Medication Refill >> Jun 25, 2024 10:23 AM Jeoffrey H wrote: Medication: Vedolizumab (REPATHA ) 140 MG/ML SOSY  Has the patient contacted their pharmacy? Yes, [facility] stated she would have to call provider for a refill. This insurance did change from [employer] to [employer].  (Agent: If no, request that the patient contact the pharmacy for the refill. If patient does not wish to contact the pharmacy document the reason why and proceed with request.) (Agent: If yes, when and what did the pharmacy advise?)  This is the patient's preferred pharmacy:  [facility] - [place], [place] - [address] [place] [facility] [place] [address] Phone: [phone]  Fax: [phone]   Is this the correct pharmacy for this prescription? Yes If no, delete pharmacy and type the correct one.   Has the prescription been filled recently? Yes  Is the patient out of the medication? No, has only 1 left.   Has the patient been seen for an appointment in the last year OR does the patient have an upcoming appointment? Yes, 06/20/2024  Can we respond through MyChart? No  Agent: Please be advised that Rx refills may take up to 3 business days. We ask that you follow-up with your pharmacy.

## 2024-07-04 ENCOUNTER — Other Ambulatory Visit: Payer: Self-pay

## 2024-07-04 DIAGNOSIS — I25118 Atherosclerotic heart disease of native coronary artery with other forms of angina pectoris: Secondary | ICD-10-CM

## 2024-07-04 DIAGNOSIS — E782 Mixed hyperlipidemia: Secondary | ICD-10-CM

## 2024-07-04 MED ORDER — REPATHA 140 MG/ML ~~LOC~~ SOSY: PREFILLED_SYRINGE | SUBCUTANEOUS | 2 refills | Status: AC

## 2024-07-09 ENCOUNTER — Other Ambulatory Visit (HOSPITAL_COMMUNITY): Payer: Self-pay

## 2024-07-09 ENCOUNTER — Telehealth: Payer: Self-pay

## 2024-07-09 NOTE — Telephone Encounter (Signed)
 Pharmacy Patient Advocate Encounter   Received notification from [facility] that prior authorization for Repatha  is required/requested.   Insurance verification completed.   The patient is insured through [employer].   Per test claim: The current 28 day co-pay is, $297.  No PA needed at this time. This test claim was processed through [facility]- copay amounts may vary at other pharmacies due to pharmacy/plan contracts, or as the patient moves through the different stages of their insurance plan.      Deductible will be met after this fill

## 2024-08-14 ENCOUNTER — Ambulatory Visit: Payer: Self-pay

## 2024-08-14 ENCOUNTER — Institutional Professional Consult (permissible substitution): Admitting: Psychology

## 2024-08-14 ENCOUNTER — Ambulatory Visit

## 2024-08-21 ENCOUNTER — Encounter: Admitting: Psychology

## 2024-12-19 ENCOUNTER — Ambulatory Visit: Admitting: Family Medicine

## 2025-02-14 ENCOUNTER — Ambulatory Visit
